# Patient Record
Sex: Male | Born: 1960 | Race: White | Hispanic: No | Smoking: Former smoker
Health system: Southern US, Community
[De-identification: ages and names within clinical notes are randomized; demographics above are authoritative.]

## PROBLEM LIST (undated history)

## (undated) DIAGNOSIS — I10 Essential (primary) hypertension: Secondary | ICD-10-CM

## (undated) DIAGNOSIS — E785 Hyperlipidemia, unspecified: Secondary | ICD-10-CM

## (undated) DIAGNOSIS — I2089 Other forms of angina pectoris: Secondary | ICD-10-CM

## (undated) DIAGNOSIS — B019 Varicella without complication: Secondary | ICD-10-CM

## (undated) DIAGNOSIS — N200 Calculus of kidney: Secondary | ICD-10-CM

## (undated) HISTORY — DX: Calculus of kidney: N20.0

## (undated) HISTORY — DX: Essential (primary) hypertension: I10

## (undated) HISTORY — PX: TONSILLECTOMY: SUR1361

## (undated) HISTORY — DX: Hyperlipidemia, unspecified: E78.5

## (undated) HISTORY — DX: Varicella without complication: B01.9

## (undated) HISTORY — DX: Other forms of angina pectoris: I20.89

---

## 2001-12-09 ENCOUNTER — Emergency Department (HOSPITAL_COMMUNITY): Admission: EM | Admit: 2001-12-09 | Discharge: 2001-12-09 | Payer: Self-pay | Admitting: Emergency Medicine

## 2005-08-19 ENCOUNTER — Ambulatory Visit: Payer: Self-pay | Admitting: Internal Medicine

## 2007-11-25 ENCOUNTER — Emergency Department (HOSPITAL_BASED_OUTPATIENT_CLINIC_OR_DEPARTMENT_OTHER): Admission: EM | Admit: 2007-11-25 | Discharge: 2007-11-25 | Payer: Self-pay | Admitting: Emergency Medicine

## 2008-08-12 ENCOUNTER — Ambulatory Visit: Payer: Self-pay | Admitting: Radiology

## 2008-08-12 ENCOUNTER — Emergency Department (HOSPITAL_BASED_OUTPATIENT_CLINIC_OR_DEPARTMENT_OTHER): Admission: EM | Admit: 2008-08-12 | Discharge: 2008-08-12 | Payer: Self-pay | Admitting: Emergency Medicine

## 2010-10-05 LAB — URINE MICROSCOPIC-ADD ON

## 2010-10-05 LAB — URINALYSIS, ROUTINE W REFLEX MICROSCOPIC
Bilirubin Urine: NEGATIVE
Glucose, UA: NEGATIVE mg/dL
Ketones, ur: NEGATIVE mg/dL
Leukocytes, UA: NEGATIVE
Nitrite: NEGATIVE
Protein, ur: NEGATIVE mg/dL
Specific Gravity, Urine: 1.029 (ref 1.005–1.030)
Urobilinogen, UA: 0.2 mg/dL (ref 0.0–1.0)
pH: 5.5 (ref 5.0–8.0)

## 2011-06-21 DIAGNOSIS — N200 Calculus of kidney: Secondary | ICD-10-CM

## 2011-06-21 HISTORY — DX: Calculus of kidney: N20.0

## 2013-09-24 ENCOUNTER — Telehealth: Payer: Self-pay

## 2013-09-24 NOTE — Telephone Encounter (Signed)
Home phone number invalid.  Unable to reach at work number.

## 2013-09-25 ENCOUNTER — Other Ambulatory Visit (INDEPENDENT_AMBULATORY_CARE_PROVIDER_SITE_OTHER): Payer: BC Managed Care – PPO

## 2013-09-25 ENCOUNTER — Ambulatory Visit: Payer: Self-pay | Admitting: Internal Medicine

## 2013-09-25 DIAGNOSIS — Z0000 Encounter for general adult medical examination without abnormal findings: Secondary | ICD-10-CM

## 2013-09-25 NOTE — Addendum Note (Signed)
Addended by: Silvio PateHOMPSON, Pablo Stauffer D on: 09/25/2013 02:52 PM   Modules accepted: Orders

## 2013-09-26 ENCOUNTER — Encounter: Payer: Self-pay | Admitting: Family Medicine

## 2013-09-26 ENCOUNTER — Ambulatory Visit (INDEPENDENT_AMBULATORY_CARE_PROVIDER_SITE_OTHER): Payer: BC Managed Care – PPO | Admitting: Family Medicine

## 2013-09-26 ENCOUNTER — Telehealth: Payer: Self-pay | Admitting: *Deleted

## 2013-09-26 VITALS — BP 120/76 | HR 82 | Temp 98.8°F | Ht 70.0 in | Wt 244.0 lb

## 2013-09-26 DIAGNOSIS — F17200 Nicotine dependence, unspecified, uncomplicated: Secondary | ICD-10-CM | POA: Insufficient documentation

## 2013-09-26 DIAGNOSIS — Z0000 Encounter for general adult medical examination without abnormal findings: Secondary | ICD-10-CM

## 2013-09-26 DIAGNOSIS — E669 Obesity, unspecified: Secondary | ICD-10-CM

## 2013-09-26 DIAGNOSIS — Z23 Encounter for immunization: Secondary | ICD-10-CM

## 2013-09-26 DIAGNOSIS — E785 Hyperlipidemia, unspecified: Secondary | ICD-10-CM

## 2013-09-26 HISTORY — DX: Obesity, unspecified: E66.9

## 2013-09-26 HISTORY — DX: Nicotine dependence, unspecified, uncomplicated: F17.200

## 2013-09-26 LAB — CBC WITH DIFFERENTIAL/PLATELET
Basophils Absolute: 0.1 10*3/uL (ref 0.0–0.1)
Basophils Relative: 0.5 % (ref 0.0–3.0)
Eosinophils Absolute: 0.4 10*3/uL (ref 0.0–0.7)
Eosinophils Relative: 4.4 % (ref 0.0–5.0)
HCT: 44.2 % (ref 39.0–52.0)
Hemoglobin: 14.5 g/dL (ref 13.0–17.0)
Lymphocytes Relative: 30.1 % (ref 12.0–46.0)
Lymphs Abs: 3 10*3/uL (ref 0.7–4.0)
MCHC: 32.9 g/dL (ref 30.0–36.0)
MCV: 87.7 fl (ref 78.0–100.0)
Monocytes Absolute: 0.7 10*3/uL (ref 0.1–1.0)
Monocytes Relative: 7.1 % (ref 3.0–12.0)
Neutro Abs: 5.7 10*3/uL (ref 1.4–7.7)
Neutrophils Relative %: 57.9 % (ref 43.0–77.0)
Platelets: 249 10*3/uL (ref 150.0–400.0)
RBC: 5.04 Mil/uL (ref 4.22–5.81)
RDW: 14.7 % — ABNORMAL HIGH (ref 11.5–14.6)
WBC: 9.8 10*3/uL (ref 4.5–10.5)

## 2013-09-26 LAB — BASIC METABOLIC PANEL
BUN: 20 mg/dL (ref 6–23)
CO2: 26 mEq/L (ref 19–32)
Calcium: 9.6 mg/dL (ref 8.4–10.5)
Chloride: 103 mEq/L (ref 96–112)
Creatinine, Ser: 0.9 mg/dL (ref 0.4–1.5)
GFR: 97.74 mL/min (ref >60.00)
Glucose, Bld: 77 mg/dL (ref 70–99)
Potassium: 3.8 mEq/L (ref 3.5–5.1)
Sodium: 139 mEq/L (ref 135–145)

## 2013-09-26 LAB — HEPATIC FUNCTION PANEL
ALT: 30 U/L (ref 0–53)
AST: 22 U/L (ref 0–37)
Albumin: 4.5 g/dL (ref 3.5–5.2)
Alkaline Phosphatase: 54 U/L (ref 39–117)
Bilirubin, Direct: 0 mg/dL (ref 0.0–0.3)
Total Bilirubin: 0.8 mg/dL (ref 0.3–1.2)
Total Protein: 8.1 g/dL (ref 6.0–8.3)

## 2013-09-26 LAB — LIPID PANEL
Cholesterol: 253 mg/dL — ABNORMAL HIGH (ref 0–200)
HDL: 40.1 mg/dL (ref >39.00)
LDL Cholesterol: 186 mg/dL — ABNORMAL HIGH (ref 0–99)
Total CHOL/HDL Ratio: 6
Triglycerides: 136 mg/dL (ref 0.0–149.0)
VLDL: 27.2 mg/dL (ref 0.0–40.0)

## 2013-09-26 NOTE — Progress Notes (Signed)
Subjective:    Patient ID: Dennis Hopkins, male    DOB: 09-10-1960, 53 y.o.   MRN: [id number]  HPI Pt here to establish and have cpe.  Pt had labs drawn yesterday.    Past Medical History  Diagnosis Date  . Hyperlipemia   . Hypertension   . Kidney stones 2013  . Chickenpox    History   Social History  . Marital Status: Married    Spouse Name: N/A    Number of Children: N/A  . Years of Education: N/A   Occupational History  . Not on file.   Social History Main Topics  . Smoking status: Current Every Day Smoker -- 2.00 packs/day for 35 years    Types: Cigarettes  . Smokeless tobacco: Never Used  . Alcohol Use: Yes     Comment: Occasionall  . Drug Use: No  . Sexual Activity: Yes    Partners: Female   Other Topics Concern  . Not on file   Social History Narrative  . No narrative on file   Family History  Problem Relation Age of Onset  . Arthritis Mother   . Arthritis Father   . Hyperlipidemia Father    No current outpatient prescriptions on file.   No current facility-administered medications for this visit.   Allergies  Allergen Reactions  . Asa [Aspirin] Anaphylaxis and Swelling    Lips swelling      Review of Systems  Constitutional: Negative.   HENT: Negative for congestion, ear pain, hearing loss, nosebleeds, postnasal drip, rhinorrhea, sinus pressure, sneezing and tinnitus.   Eyes: Negative for photophobia, discharge, itching and visual disturbance.  Respiratory: Negative.   Cardiovascular: Negative.   Gastrointestinal: Negative for abdominal pain, constipation, blood in stool, abdominal distention and anal bleeding.  Endocrine: Negative.   Genitourinary: Negative.   Musculoskeletal: Negative.   Skin: Negative.   Allergic/Immunologic: Negative.   Neurological: Negative for dizziness, weakness, light-headedness, numbness and headaches.  Psychiatric/Behavioral: Negative for suicidal ideas, confusion, sleep disturbance, dysphoric mood,  decreased concentration and agitation. The patient is not nervous/anxious.        Objective:   Physical Exam  Constitutional: He is oriented to person, place, and time. He appears well-developed and well-nourished. No distress.  HENT:  Head: Normocephalic and atraumatic.  Right Ear: External ear normal.  Left Ear: External ear normal.  Nose: Nose normal.  Mouth/Throat: Oropharynx is clear and moist. No oropharyngeal exudate.  Eyes: Conjunctivae and EOM are normal. Pupils are equal, round, and reactive to light. Right eye exhibits no discharge. Left eye exhibits no discharge.  Neck: Normal range of motion. Neck supple. No JVD present. No thyromegaly present.  Cardiovascular: Normal rate, regular rhythm and intact distal pulses.  Exam reveals no gallop and no friction rub.   No murmur heard. Pulmonary/Chest: Effort normal and breath sounds normal. No respiratory distress. He has no wheezes. He has no rales. He exhibits no tenderness.  Abdominal: Soft. Bowel sounds are normal. He exhibits no distension and no mass. There is no tenderness. There is no rebound and no guarding.  Genitourinary: Rectum normal, prostate normal and penis normal. Guaiac negative stool.  Musculoskeletal: Normal range of motion. He exhibits no edema and no tenderness.  Lymphadenopathy:    He has no cervical adenopathy.  Neurological: He is alert and oriented to person, place, and time. He displays normal reflexes. He exhibits normal muscle tone.  Skin: Skin is warm and dry. No rash noted. He is not diaphoretic. No erythema.  No pallor.  Psychiatric: He has a normal mood and affect. His behavior is normal. Judgment and thought content normal.          Assessment & Plan:  1. Tobacco use disorder Pt given HO and we discussed options-- he wants to try e cig  2. Other and unspecified hyperlipidemia Labs reviewed and repeat in 3 months - Hepatic function panel; Future - Lipid panel; Future  3. Preventative  health care Check labs ghm--- not utd Colon ordered tdap given today - Ambulatory referral to Gastroenterology

## 2013-09-26 NOTE — Progress Notes (Signed)
Pre visit review using our clinic review tool, if applicable. No additional management support is needed unless otherwise documented below in the visit note. 

## 2013-09-26 NOTE — Patient Instructions (Addendum)
Preventive Care for Adults, Male A healthy lifestyle and preventive care can promote health and wellness. Preventive health guidelines for men include the following key practices:  A routine yearly physical is a good way to check with your health care provider about your health and preventative screening. It is a chance to share any concerns and updates on your health and to receive a thorough exam.  Visit your dentist for a routine exam and preventative care every 6 months. Brush your teeth twice a day and floss once a day. Good oral hygiene prevents tooth decay and gum disease.  The frequency of eye exams is based on your age, health, family medical history, use of contact lenses, and other factors. Follow your health care provider's recommendations for frequency of eye exams.  Eat a healthy diet. Foods such as vegetables, fruits, whole grains, low-fat dairy products, and lean protein foods contain the nutrients you need without too many calories. Decrease your intake of foods high in solid fats, added sugars, and salt. Eat the right amount of calories for you.Get information about a proper diet from your health care provider, if necessary.  Regular physical exercise is one of the most important things you can do for your health. Most adults should get at least 150 minutes of moderate-intensity exercise (any activity that increases your heart rate and causes you to sweat) each week. In addition, most adults need muscle-strengthening exercises on 2 or more days a week.  Maintain a healthy weight. The body mass index (BMI) is a screening tool to identify possible weight problems. It provides an estimate of body fat based on height and weight. Your health care provider can find your BMI and can help you achieve or maintain a healthy weight.For adults 20 years and older:  A BMI below 18.5 is considered underweight.  A BMI of 18.5 to 24.9 is normal.  A BMI of 25 to 29.9 is considered  overweight.  A BMI of 30 and above is considered obese.  Maintain normal blood lipids and cholesterol levels by exercising and minimizing your intake of saturated fat. Eat a balanced diet with plenty of fruit and vegetables. Blood tests for lipids and cholesterol should begin at age 42 and be repeated every 5 years. If your lipid or cholesterol levels are high, you are over 50, or you are at high risk for heart disease, you may need your cholesterol levels checked more frequently.Ongoing high lipid and cholesterol levels should be treated with medicines if diet and exercise are not working.  If you smoke, find out from your health care provider how to quit. If you do not use tobacco, do not start.  Lung cancer screening is recommended for adults aged 24 80 years who are at high risk for developing lung cancer because of a history of smoking. A yearly low-dose CT scan of the lungs is recommended for people who have at least a 30-pack-year history of smoking and are a current smoker or have quit within the past 15 years. A pack year of smoking is smoking an average of 1 pack of cigarettes a day for 1 year (for example: 1 pack a day for 30 years or 2 packs a day for 15 years). Yearly screening should continue until the smoker has stopped smoking for at least 15 years. Yearly screening should be stopped for people who develop a health problem that would prevent them from having lung cancer treatment.  If you choose to drink alcohol, do not have  more than 2 drinks per day. One drink is considered to be 12 ounces (355 mL) of beer, 5 ounces (148 mL) of wine, or 1.5 ounces (44 mL) of liquor.  Avoid use of street drugs. Do not share needles with anyone. Ask for help if you need support or instructions about stopping the use of drugs.  High blood pressure causes heart disease and increases the risk of stroke. Your blood pressure should be checked at least every 1 2 years. Ongoing high blood pressure should be  treated with medicines, if weight loss and exercise are not effective.  If you are 75 53 years old, ask your health care provider if you should take aspirin to prevent heart disease.  Diabetes screening involves taking a blood sample to check your fasting blood sugar level. This should be done once every 3 years, after age 19, if you are within normal weight and without risk factors for diabetes. Testing should be considered at a younger age or be carried out more frequently if you are overweight and have at least 1 risk factor for diabetes.  Colorectal cancer can be detected and often prevented. Most routine colorectal cancer screening begins at the age of 47 and continues through age 80. However, your health care provider may recommend screening at an earlier age if you have risk factors for colon cancer. On a yearly basis, your health care provider may provide home test kits to check for hidden blood in the stool. Use of a small camera at the end of a tube to directly examine the colon (sigmoidoscopy or colonoscopy) can detect the earliest forms of colorectal cancer. Talk to your health care provider about this at age 66, when routine screening begins. Direct exam of the colon should be repeated every 5 10 years through age 19, unless early forms of precancerous polyps or small growths are found.  People who are at an increased risk for hepatitis B should be screened for this virus. You are considered at high risk for hepatitis B if:  You were born in a country where hepatitis B occurs often. Talk with your health care provider about which countries are considered high-risk.  Your parents were born in a high-risk country and you have not received a shot to protect against hepatitis B (hepatitis B vaccine).  You have HIV or AIDS.  You use needles to inject street drugs.  You live with, or have sex with, someone who has hepatitis B.  You are a man who has sex with other men (MSM).  You get  hemodialysis treatment.  You take certain medicines for conditions such as cancer, organ transplantation, and autoimmune conditions.  Hepatitis C blood testing is recommended for all people born from [birth year] through 1965 and any individual with known risks for hepatitis C.  Practice safe sex. Use condoms and avoid high-risk sexual practices to reduce the spread of sexually transmitted infections (STIs). STIs include gonorrhea, chlamydia, syphilis, trichomonas, herpes, HPV, and human immunodeficiency virus (HIV). Herpes, HIV, and HPV are viral illnesses that have no cure. They can result in disability, cancer, and death.  A one-time screening for abdominal aortic aneurysm (AAA) and surgical repair of large AAAs by ultrasound are recommended for men ages 94 to 74 years who are current or former smokers.  Healthy men should no longer receive prostate-specific antigen (PSA) blood tests as part of routine cancer screening. Talk with your health care provider about prostate cancer screening.  Testicular cancer screening is not recommended  for adult males who have no symptoms. Screening includes self-exam, a health care provider exam, and other screening tests. Consult with your health care provider about any symptoms you have or any concerns you have about testicular cancer.  Use sunscreen. Apply sunscreen liberally and repeatedly throughout the day. You should seek shade when your shadow is shorter than you. Protect yourself by wearing long sleeves, pants, a wide-brimmed hat, and sunglasses year round, whenever you are outdoors.  Once a month, do a whole-body skin exam, using a mirror to look at the skin on your back. Tell your health care provider about new moles, moles that have irregular borders, moles that are larger than a pencil eraser, or moles that have changed in shape or color.  Stay current with required vaccines (immunizations).  Influenza vaccine. All adults should be immunized every  year.  Tetanus, diphtheria, and acellular pertussis (Td, Tdap) vaccine. An adult who has not previously received Tdap or who does not know his vaccine status should receive 1 dose of Tdap. This initial dose should be followed by tetanus and diphtheria toxoids (Td) booster doses every 10 years. Adults with an unknown or incomplete history of completing a 3-dose immunization series with Td-containing vaccines should begin or complete a primary immunization series including a Tdap dose. Adults should receive a Td booster every 10 years.  Varicella vaccine. An adult without evidence of immunity to varicella should receive 2 doses or a second dose if he has previously received 1 dose.  Human papillomavirus (HPV) vaccine. Males aged 44 21 years who have not received the vaccine previously should receive the 3-dose series. Males aged 43 26 years may be immunized. Immunization is recommended through the age of 50 years for any male who has sex with males and did not get any or all doses earlier. Immunization is recommended for any person with an immunocompromised condition through the age of 23 years if he did not get any or all doses earlier. During the 3-dose series, the second dose should be obtained 4 8 weeks after the first dose. The third dose should be obtained 24 weeks after the first dose and 16 weeks after the second dose.  Zoster vaccine. One dose is recommended for adults aged 96 years or older unless certain conditions are present.  Measles, mumps, and rubella (MMR) vaccine. Adults born before 55 generally are considered immune to measles and mumps. Adults born in [birth year] or later should have 1 or more doses of MMR vaccine unless there is a contraindication to the vaccine or there is laboratory evidence of immunity to each of the three diseases. A routine second dose of MMR vaccine should be obtained at least 28 days after the first dose for students attending postsecondary schools, health care  workers, or international travelers. People who received inactivated measles vaccine or an unknown type of measles vaccine during 1963 1967 should receive 2 doses of MMR vaccine. People who received inactivated mumps vaccine or an unknown type of mumps vaccine before 1979 and are at high risk for mumps infection should consider immunization with 2 doses of MMR vaccine. Unvaccinated health care workers born before [birth year] who lack laboratory evidence of measles, mumps, or rubella immunity or laboratory confirmation of disease should consider measles and mumps immunization with 2 doses of MMR vaccine or rubella immunization with 1 dose of MMR vaccine.  Pneumococcal 13-valent conjugate (PCV13) vaccine. When indicated, a person who is uncertain of his immunization history and has no record of immunization  should receive the PCV13 vaccine. An adult aged 67 years or older who has certain medical conditions and has not been previously immunized should receive 1 dose of PCV13 vaccine. This PCV13 should be followed with a dose of pneumococcal polysaccharide (PPSV23) vaccine. The PPSV23 vaccine dose should be obtained at least 8 weeks after the dose of PCV13 vaccine. An adult aged 79 years or older who has certain medical conditions and previously received 1 or more doses of PPSV23 vaccine should receive 1 dose of PCV13. The PCV13 vaccine dose should be obtained 1 or more years after the last PPSV23 vaccine dose.  Pneumococcal polysaccharide (PPSV23) vaccine. When PCV13 is also indicated, PCV13 should be obtained first. All adults aged 74 years and older should be immunized. An adult younger than age 50 years who has certain medical conditions should be immunized. Any person who resides in a nursing home or long-term care facility should be immunized. An adult smoker should be immunized. People with an immunocompromised condition and certain other conditions should receive both PCV13 and PPSV23 vaccines. People with human  immunodeficiency virus (HIV) infection should be immunized as soon as possible after diagnosis. Immunization during chemotherapy or radiation therapy should be avoided. Routine use of PPSV23 vaccine is not recommended for American Indians, [place] Natives, or people younger than 65 years unless there are medical conditions that require PPSV23 vaccine. When indicated, people who have unknown immunization and have no record of immunization should receive PPSV23 vaccine. One-time revaccination 5 years after the first dose of PPSV23 is recommended for people aged 41 64 years who have chronic kidney failure, nephrotic syndrome, asplenia, or immunocompromised conditions. People who received 1 2 doses of PPSV23 before age 15 years should receive another dose of PPSV23 vaccine at age 48 years or later if at least 5 years have passed since the previous dose. Doses of PPSV23 are not needed for people immunized with PPSV23 at or after age 69 years.  Meningococcal vaccine. Adults with asplenia or persistent complement component deficiencies should receive 2 doses of quadrivalent meningococcal conjugate (MenACWY-D) vaccine. The doses should be obtained at least 2 months apart. Microbiologists working with certain meningococcal bacteria, [place] recruits, people at risk during an outbreak, and people who travel to or live in countries with a high rate of meningitis should be immunized. A first-year college student up through age 7 years who is living in a residence hall should receive a dose if he did not receive a dose on or after his 16th birthday. Adults who have certain high-risk conditions should receive one or more doses of vaccine.  Hepatitis A vaccine. Adults who wish to be protected from this disease, have certain high-risk conditions, work with hepatitis A-infected animals, work in hepatitis A research labs, or travel to or work in countries with a high rate of hepatitis A should be immunized. Adults who were  previously unvaccinated and who anticipate close contact with an international adoptee during the first 60 days after arrival in the [place] from a country with a high rate of hepatitis A should be immunized.  Hepatitis B vaccine. Adults who wish to be protected from this disease, have certain high-risk conditions, may be exposed to blood or other infectious body fluids, are household contacts or sex partners of hepatitis B positive people, are clients or workers in certain care facilities, or travel to or work in countries with a high rate of hepatitis B should be immunized.  Haemophilus influenzae type b (Hib) vaccine. A  previously unvaccinated person with asplenia or sickle cell disease or having a scheduled splenectomy should receive 1 dose of Hib vaccine. Regardless of previous immunization, a recipient of a hematopoietic stem cell transplant should receive a 3-dose series 6 12 months after his successful transplant. Hib vaccine is not recommended for adults with HIV infection. Preventive Service / Frequency Ages 62 to 3  Blood pressure check.** / Every 1 to 2 years.  Lipid and cholesterol check.** / Every 5 years beginning at age 43.  Hepatitis C blood test.** / For any individual with known risks for hepatitis C.  Skin self-exam. / Monthly.  Influenza vaccine. / Every year.  Tetanus, diphtheria, and acellular pertussis (Tdap, Td) vaccine.** / Consult your health care provider. 1 dose of Td every 10 years.  Varicella vaccine.** / Consult your health care provider.  HPV vaccine. / 3 doses over 6 months, if 48 or younger.  Measles, mumps, rubella (MMR) vaccine.** / You need at least 1 dose of MMR if you were born in 1957 or later. You may also need a second dose.  Pneumococcal 13-valent conjugate (PCV13) vaccine.** / Consult your health care provider.  Pneumococcal polysaccharide (PPSV23) vaccine.** / 1 to 2 doses if you smoke cigarettes or if you have certain  conditions.  Meningococcal vaccine.** / 1 dose if you are age 8 to 70 years and a Market researcher living in a residence hall, or have one of several medical conditions. You may also need additional booster doses.  Hepatitis A vaccine.** / Consult your health care provider.  Hepatitis B vaccine.** / Consult your health care provider.  Haemophilus influenzae type b (Hib) vaccine.** / Consult your health care provider. Ages 48 to 32  Blood pressure check.** / Every 1 to 2 years.  Lipid and cholesterol check.** / Every 5 years beginning at age 38.  Lung cancer screening. / Every year if you are aged 40 80 years and have a 30-pack-year history of smoking and currently smoke or have quit within the past 15 years. Yearly screening is stopped once you have quit smoking for at least 15 years or develop a health problem that would prevent you from having lung cancer treatment.  Fecal occult blood test (FOBT) of stool. / Every year beginning at age 4 and continuing until age 70. You may not have to do this test if you get a colonoscopy every 10 years.  Flexible sigmoidoscopy** or colonoscopy.** / Every 5 years for a flexible sigmoidoscopy or every 10 years for a colonoscopy beginning at age 76 and continuing until age 62.  Hepatitis C blood test.** / For all people born from [birth year] through 1965 and any individual with known risks for hepatitis C.  Skin self-exam. / Monthly.  Influenza vaccine. / Every year.  Tetanus, diphtheria, and acellular pertussis (Tdap/Td) vaccine.** / Consult your health care provider. 1 dose of Td every 10 years.  Varicella vaccine.** / Consult your health care provider.  Zoster vaccine.** / 1 dose for adults aged 60 years or older.  Measles, mumps, rubella (MMR) vaccine.** / You need at least 1 dose of MMR if you were born in 1957 or later. You may also need a second dose.  Pneumococcal 13-valent conjugate (PCV13) vaccine.** / Consult your health care  provider.  Pneumococcal polysaccharide (PPSV23) vaccine.** / 1 to 2 doses if you smoke cigarettes or if you have certain conditions.  Meningococcal vaccine.** / Consult your health care provider.  Hepatitis A vaccine.** / Consult your health care  provider.  Hepatitis B vaccine.** / Consult your health care provider.  Haemophilus influenzae type b (Hib) vaccine.** / Consult your health care provider. Ages 81 and over  Blood pressure check.** / Every 1 to 2 years.  Lipid and cholesterol check.**/ Every 5 years beginning at age 78.  Lung cancer screening. / Every year if you are aged 60 80 years and have a 30-pack-year history of smoking and currently smoke or have quit within the past 15 years. Yearly screening is stopped once you have quit smoking for at least 15 years or develop a health problem that would prevent you from having lung cancer treatment.  Fecal occult blood test (FOBT) of stool. / Every year beginning at age 16 and continuing until age 5. You may not have to do this test if you get a colonoscopy every 10 years.  Flexible sigmoidoscopy** or colonoscopy.** / Every 5 years for a flexible sigmoidoscopy or every 10 years for a colonoscopy beginning at age 68 and continuing until age 58.  Hepatitis C blood test.** / For all people born from [birth year] through 1965 and any individual with known risks for hepatitis C.  Abdominal aortic aneurysm (AAA) screening.** / A one-time screening for ages 58 to 36 years who are current or former smokers.  Skin self-exam. / Monthly.  Influenza vaccine. / Every year.  Tetanus, diphtheria, and acellular pertussis (Tdap/Td) vaccine.** / 1 dose of Td every 10 years.  Varicella vaccine.** / Consult your health care provider.  Zoster vaccine.** / 1 dose for adults aged 26 years or older.  Pneumococcal 13-valent conjugate (PCV13) vaccine.** / Consult your health care provider.  Pneumococcal polysaccharide (PPSV23) vaccine.** / 1 dose for all  adults aged 64 years and older.  Meningococcal vaccine.** / Consult your health care provider.  Hepatitis A vaccine.** / Consult your health care provider.  Hepatitis B vaccine.** / Consult your health care provider.  Haemophilus influenzae type b (Hib) vaccine.** / Consult your health care provider. **Family history and personal history of risk and conditions may change your health care provider's recommendations. Document Released: 08/02/2001 Document Revised: 03/27/2013 Document Reviewed: 11/01/2010 [facility] Patient Information 2014 [place], [place]. Smoking Cessation, Tips for Success If you are ready to quit smoking, congratulations! You have chosen to help yourself be healthier. Cigarettes bring nicotine, tar, carbon monoxide, and other irritants into your body. Your lungs, heart, and blood vessels will be able to work better without these poisons. There are many different ways to quit smoking. Nicotine gum, nicotine patches, a nicotine inhaler, or nicotine nasal spray can help with physical craving. Hypnosis, support groups, and medicines help break the habit of smoking. WHAT THINGS CAN I DO TO MAKE QUITTING EASIER?  Here are some tips to help you quit for good:  Pick a date when you will quit smoking completely. Tell all of your friends and family about your plan to quit on that date.  Do not try to slowly cut down on the number of cigarettes you are smoking. Pick a quit date and quit smoking completely starting on that day.  Throw away all cigarettes.   Clean and remove all ashtrays from your home, work, and car.   On a card, write down your reasons for quitting. Carry the card with you and read it when you get the urge to smoke.   Cleanse your body of nicotine. Drink enough water and fluids to keep your urine clear or pale yellow. Do this after quitting to flush the nicotine from  your body.   Learn to predict your moods. Do not let a bad situation be your excuse to have a  cigarette. Some situations in your life might tempt you into wanting a cigarette.   Never have "just one" cigarette. It leads to wanting another and another. Remind yourself of your decision to quit.   Change habits associated with smoking. If you smoked while driving or when feeling stressed, try other activities to replace smoking. Stand up when drinking your coffee. Brush your teeth after eating. Sit in a different chair when you read the paper. Avoid alcohol while trying to quit, and try to drink fewer caffeinated beverages. Alcohol and caffeine may urge you to smoke.   Avoid foods and drinks that can trigger a desire to smoke, such as sugary or spicy foods and alcohol.   Ask people who smoke not to smoke around you.   Have something planned to do right after eating or having a cup of coffee. For example, plan to take a walk or exercise.   Try a relaxation exercise to calm you down and decrease your stress. Remember, you may be tense and nervous for the first 2 weeks after you quit, but this will pass.   Find new activities to keep your hands busy. Play with a pen, coin, or rubber band. Doodle or draw things on paper.   Brush your teeth right after eating. This will help cut down on the craving for the taste of tobacco after meals. You can also try mouthwash.   Use oral substitutes in place of cigarettes. Try using lemon drops, carrots, cinnamon sticks, or chewing gum. Keep them handy so they are available when you have the urge to smoke.   When you have the urge to smoke, try deep breathing.   Designate your home as a nonsmoking area.   If you are a heavy smoker, ask your health care provider about a prescription for nicotine chewing gum. It can ease your withdrawal from nicotine.   Reward yourself. Set aside the cigarette money you save and buy yourself something nice.   Look for support from others. Join a support group or smoking cessation program. Ask someone at  home or at work to help you with your plan to quit smoking.   Always ask yourself, "Do I need this cigarette or is this just a reflex?" Tell yourself, "Today, I choose not to smoke," or "I do not want to smoke." You are reminding yourself of your decision to quit.  Do not replace cigarette smoking with electronic cigarettes (commonly called e-cigarettes). The safety of e-cigarettes is unknown, and some may contain harmful chemicals.  If you relapse, do not give up! Plan ahead and think about what you will do the next time you get the urge to smoke.  HOW WILL I FEEL WHEN I QUIT SMOKING? You may have symptoms of withdrawal because your body is used to nicotine (the addictive substance in cigarettes). You may crave cigarettes, be irritable, feel very hungry, cough often, get headaches, or have difficulty concentrating. The withdrawal symptoms are only temporary. They are strongest when you first quit but will go away within 10 14 days. When withdrawal symptoms occur, stay in control. Think about your reasons for quitting. Remind yourself that these are signs that your body is healing and getting used to being without cigarettes. Remember that withdrawal symptoms are easier to treat than the major diseases that smoking can cause.  Even after the withdrawal is over,  expect periodic urges to smoke. However, these cravings are generally short lived and will go away whether you smoke or not. Do not smoke!  WHAT RESOURCES ARE AVAILABLE TO HELP ME QUIT SMOKING? Your health care provider can direct you to community resources or hospitals for support, which may include:  Group support.  Education.  Hypnosis.  Therapy. Document Released: 03/04/2004 Document Revised: 03/27/2013 Document Reviewed: 11/22/2012 [facility] Patient Information 2014 [place], [place].

## 2013-09-26 NOTE — Telephone Encounter (Signed)
Unable to reach prior to visit  

## 2013-09-26 NOTE — Addendum Note (Signed)
Addended by: Arnette NorrisPAYNE, Vasti Yagi P on: 09/26/2013 05:01 PM   Modules accepted: Orders

## 2013-09-26 NOTE — Telephone Encounter (Signed)
Patient presented to the office yesterday for a new to establish appt with Dr. Drue NovelPaz (Pt thought he was seeing Dr. Beverely Lowabori, refused to see Dr. Drue NovelPaz.) Patient was made aware that Dr.Tabori did not have anything available until the end of May, pt stated he could not wait that long. Appt scheduled with Dr.Lowne for 09/26/13 at 3:45, pt requested labs be drawn since he was already fasting.Orders placed and drawn  Lipid, CBC-D, BMP and Hepatic panel for upcoming appt.

## 2013-10-28 ENCOUNTER — Encounter: Payer: Self-pay | Admitting: Family Medicine

## 2013-12-26 ENCOUNTER — Other Ambulatory Visit (INDEPENDENT_AMBULATORY_CARE_PROVIDER_SITE_OTHER): Payer: BC Managed Care – PPO

## 2013-12-26 DIAGNOSIS — E785 Hyperlipidemia, unspecified: Secondary | ICD-10-CM

## 2013-12-26 LAB — HEPATIC FUNCTION PANEL
ALT: 21 U/L (ref 0–53)
AST: 19 U/L (ref 0–37)
Albumin: 4.1 g/dL (ref 3.5–5.2)
Alkaline Phosphatase: 52 U/L (ref 39–117)
Bilirubin, Direct: 0 mg/dL (ref 0.0–0.3)
Total Bilirubin: 0.4 mg/dL (ref 0.2–1.2)
Total Protein: 7.3 g/dL (ref 6.0–8.3)

## 2013-12-26 LAB — LIPID PANEL
Cholesterol: 211 mg/dL — ABNORMAL HIGH (ref 0–200)
HDL: 44.2 mg/dL (ref >39.00)
LDL Cholesterol: 152 mg/dL — ABNORMAL HIGH (ref 0–99)
NonHDL: 166.8
Total CHOL/HDL Ratio: 5
Triglycerides: 75 mg/dL (ref 0.0–149.0)
VLDL: 15 mg/dL (ref 0.0–40.0)

## 2014-09-10 ENCOUNTER — Telehealth: Payer: Self-pay | Admitting: Family Medicine

## 2014-09-10 NOTE — Telephone Encounter (Signed)
pre visit letter sent °

## 2014-09-29 ENCOUNTER — Encounter: Payer: Self-pay | Admitting: Family Medicine

## 2014-09-29 ENCOUNTER — Ambulatory Visit (INDEPENDENT_AMBULATORY_CARE_PROVIDER_SITE_OTHER): Payer: BLUE CROSS/BLUE SHIELD | Admitting: Family Medicine

## 2014-09-29 VITALS — BP 120/80 | HR 81 | Temp 98.3°F | Ht 71.0 in | Wt 258.0 lb

## 2014-09-29 DIAGNOSIS — Z0000 Encounter for general adult medical examination without abnormal findings: Secondary | ICD-10-CM | POA: Insufficient documentation

## 2014-09-29 HISTORY — DX: Encounter for general adult medical examination without abnormal findings: Z00.00

## 2014-09-29 NOTE — Progress Notes (Signed)
Patient ID: Dennis Hopkins, male    DOB: 1961/05/14  Age: 54 y.o. MRN: [id number]    Subjective:  Subjective HPI SUREN PAYNE presents for cpe---  No problems Review of Systems  Constitutional: Negative.   HENT: Negative for congestion, ear pain, hearing loss, nosebleeds, postnasal drip, rhinorrhea, sinus pressure, sneezing and tinnitus.   Eyes: Negative for photophobia, discharge, itching and visual disturbance.  Respiratory: Negative.   Cardiovascular: Negative.   Gastrointestinal: Negative for abdominal pain, constipation, blood in stool, abdominal distention and anal bleeding.  Endocrine: Negative.   Genitourinary: Negative.   Musculoskeletal: Negative.   Skin: Negative.   Allergic/Immunologic: Negative.   Neurological: Negative for dizziness, weakness, light-headedness, numbness and headaches.  Psychiatric/Behavioral: Negative for suicidal ideas, confusion, sleep disturbance, dysphoric mood, decreased concentration and agitation. The patient is not nervous/anxious.     History Past Medical History  Diagnosis Date  . Hyperlipemia   . Hypertension   . Kidney stones 2013  . Chickenpox     He has past surgical history that includes No past surgeries.   His family history includes Arthritis in his father and mother; Diabetes in his brother; Hyperlipidemia in his father.He reports that he quit smoking about a year ago. His smoking use included Cigarettes. He has a 70 pack-year smoking history. He has never used smokeless tobacco. He reports that he drinks alcohol. He reports that he does not use illicit drugs.  No current outpatient prescriptions on file prior to visit.   No current facility-administered medications on file prior to visit.     Objective:  Objective Physical Exam  Constitutional: He is oriented to person, place, and time. He appears well-developed and well-nourished. No distress.  HENT:  Head: Normocephalic and atraumatic.  Right Ear: External ear  normal.  Left Ear: External ear normal.  Nose: Nose normal.  Mouth/Throat: Oropharynx is clear and moist. No oropharyngeal exudate.  Eyes: Conjunctivae and EOM are normal. Pupils are equal, round, and reactive to light. Right eye exhibits no discharge. Left eye exhibits no discharge.  Neck: Normal range of motion. Neck supple. No JVD present. No thyromegaly present.  Cardiovascular: Normal rate, regular rhythm, normal heart sounds and intact distal pulses.  Exam reveals no gallop and no friction rub.   No murmur heard. Pulmonary/Chest: Effort normal and breath sounds normal. No respiratory distress. He has no wheezes. He has no rales. He exhibits no tenderness.  Abdominal: Soft. Bowel sounds are normal. He exhibits no distension and no mass. There is no tenderness. There is no rebound and no guarding.  Genitourinary: Rectum normal, prostate normal and penis normal. Guaiac negative stool.  Musculoskeletal: Normal range of motion. He exhibits no edema or tenderness.  Lymphadenopathy:    He has no cervical adenopathy.  Neurological: He is alert and oriented to person, place, and time. He displays normal reflexes. He exhibits normal muscle tone.  Skin: Skin is warm and dry. No rash noted. He is not diaphoretic. No erythema. No pallor.  Psychiatric: He has a normal mood and affect. His behavior is normal. Judgment and thought content normal.   BP 120/80 mmHg  Pulse 81  Temp(Src) 98.3 F (36.8 C) (Oral)  Ht  (1.803 m)  Wt 258 lb (117.028 kg)  BMI 36.00 kg/m2  SpO2 98% Wt Readings from Last 3 Encounters:  09/29/14 258 lb (117.028 kg)  09/26/13 244 lb (110.678 kg)     Lab Results  Component Value Date   WBC 9.8 09/25/2013   HGB  14.5 09/25/2013   HCT 44.2 09/25/2013   PLT 249.0 09/25/2013   GLUCOSE 77 09/25/2013   CHOL 211* 12/26/2013   TRIG 75.0 12/26/2013   HDL 44.20 12/26/2013   LDLCALC 152* 12/26/2013   ALT 21 12/26/2013   AST 19 12/26/2013   NA 139 09/25/2013   K 3.8  09/25/2013   CL 103 09/25/2013   CREATININE 0.9 09/25/2013   BUN 20 09/25/2013   CO2 26 09/25/2013    Ct Abdomen Wo Contrast  08/12/2008   Clinical Data:  Right flank pain.  Hematuria.   CT ABDOMEN AND PELVIS WITHOUT CONTRAST   Technique:  Multidetector CT imaging of the abdomen and pelvis was performed following the standard protocol without intravenous contrast.   Comparison:  None available.   CT ABDOMEN   Findings:  Mild dependent atelectasis is present at the lung bases bilaterally.  The heart size is normal.  There is no significant pleural or pericardial effusion.   The liver and spleen are within normal limits.  The stomach is unremarkable.  The pancreas, common bile duct, and gallbladder are normal.  The adrenal glands and left kidney are within normal limits.  There is mild to moderate right-sided hydronephrosis.  The right ureter is dilated throughout its course into the pelvis where a 4 mm stone is obstructing ureter just above ureteral vesicle junction.  No additional stones are identified within the right kidney.  Minimal atherosclerotic calcifications are noted in the aorta without aneurysm.  Bone windows demonstrate a vacuum phenomenon at L4-5 with a large Schmorl's node invaginating into the superior endplate of L5.  Multilevel facet degenerative changes evident.   IMPRESSION: 1.  Obstructing 4 mm distal right ureteral stone, just above the right UVJ with mild to moderate hydroureter nephrosis. 2.  No additional nephrolithiasis evident. 3.  Degenerative changes of the lumbar spine including a large superior endplate Schmorl's node at L5.   CT PELVIS   Findings:  The sigmoid colon is mostly collapsed.  There is some air and stool within the ascending and transverse colon.  The appendix is visualized and within normal limits.  The urinary bladder is mostly collapsed.  There is no significant pelvic lymphadenopathy or free fluid.  Bone windows demonstrate mild degenerative change of the SI  joints bilaterally.   IMPRESSION: 1.  Distal right ureteral stone. 2.  No other significant pathology of the pelvis. 3.  Degenerative changes of the SI joints.  Provider: Imelda Pillow, Jenna McLawhon  Ct Pelvis Wo Contrast  08/12/2008   Clinical Data:  Right flank pain.  Hematuria.   CT ABDOMEN AND PELVIS WITHOUT CONTRAST   Technique:  Multidetector CT imaging of the abdomen and pelvis was performed following the standard protocol without intravenous contrast.   Comparison:  None available.   CT ABDOMEN   Findings:  Mild dependent atelectasis is present at the lung bases bilaterally.  The heart size is normal.  There is no significant pleural or pericardial effusion.   The liver and spleen are within normal limits.  The stomach is unremarkable.  The pancreas, common bile duct, and gallbladder are normal.  The adrenal glands and left kidney are within normal limits.  There is mild to moderate right-sided hydronephrosis.  The right ureter is dilated throughout its course into the pelvis where a 4 mm stone is obstructing ureter just above ureteral vesicle junction.  No additional stones are identified within the right kidney.  Minimal atherosclerotic calcifications are noted in the aorta without  aneurysm.  Bone windows demonstrate a vacuum phenomenon at L4-5 with a large Schmorl's node invaginating into the superior endplate of L5.  Multilevel facet degenerative changes evident.   IMPRESSION: 1.  Obstructing 4 mm distal right ureteral stone, just above the right UVJ with mild to moderate hydroureter nephrosis. 2.  No additional nephrolithiasis evident. 3.  Degenerative changes of the lumbar spine including a large superior endplate Schmorl's node at L5.   CT PELVIS   Findings:  The sigmoid colon is mostly collapsed.  There is some air and stool within the ascending and transverse colon.  The appendix is visualized and within normal limits.  The urinary bladder is mostly collapsed.  There is no significant pelvic  lymphadenopathy or free fluid.  Bone windows demonstrate mild degenerative change of the SI joints bilaterally.   IMPRESSION: 1.  Distal right ureteral stone. 2.  No other significant pathology of the pelvis. 3.  Degenerative changes of the SI joints.  Provider: Imelda PillowSharon Jarriel, Jeannie DoneJenna McLawhon    Assessment & Plan:  Plan [name] does not currently have medications on file.  No orders of the defined types were placed in this encounter.    Problem List Items Addressed This Visit    Preventative health care - Primary    Check labs ghm utd except colon-- pt is refusing-- he states he just "doesn't want it"       Relevant Orders   Basic metabolic panel   CBC with Differential/Platelet   Hepatic function panel   Lipid panel   Microalbumin / creatinine urine ratio   POCT urinalysis dipstick   TSH      Follow-up: Return in about 1 year (around 09/29/2015), or if symptoms worsen or fail to improve.  Loreen FreudYvonne Lowne, DO

## 2014-09-29 NOTE — Patient Instructions (Signed)
Preventive Care for Adults A healthy lifestyle and preventive care can promote health and wellness. Preventive health guidelines for men include the following key practices:  A routine yearly physical is a good way to check with your health care provider about your health and preventative screening. It is a chance to share any concerns and updates on your health and to receive a thorough exam.  Visit your dentist for a routine exam and preventative care every 6 months. Brush your teeth twice a day and floss once a day. Good oral hygiene prevents tooth decay and gum disease.  The frequency of eye exams is based on your age, health, family medical history, use of contact lenses, and other factors. Follow your health care provider's recommendations for frequency of eye exams.  Eat a healthy diet. Foods such as vegetables, fruits, whole grains, low-fat dairy products, and lean protein foods contain the nutrients you need without too many calories. Decrease your intake of foods high in solid fats, added sugars, and salt. Eat the right amount of calories for you.Get information about a proper diet from your health care provider, if necessary.  Regular physical exercise is one of the most important things you can do for your health. Most adults should get at least 150 minutes of moderate-intensity exercise (any activity that increases your heart rate and causes you to sweat) each week. In addition, most adults need muscle-strengthening exercises on 2 or more days a week.  Maintain a healthy weight. The body mass index (BMI) is a screening tool to identify possible weight problems. It provides an estimate of body fat based on height and weight. Your health care provider can find your BMI and can help you achieve or maintain a healthy weight.For adults 20 years and older:  A BMI below 18.5 is considered underweight.  A BMI of 18.5 to 24.9 is normal.  A BMI of 25 to 29.9 is considered overweight.  A BMI  of 30 and above is considered obese.  Maintain normal blood lipids and cholesterol levels by exercising and minimizing your intake of saturated fat. Eat a balanced diet with plenty of fruit and vegetables. Blood tests for lipids and cholesterol should begin at age 50 and be repeated every 5 years. If your lipid or cholesterol levels are high, you are over 50, or you are at high risk for heart disease, you may need your cholesterol levels checked more frequently.Ongoing high lipid and cholesterol levels should be treated with medicines if diet and exercise are not working.  If you smoke, find out from your health care provider how to quit. If you do not use tobacco, do not start.  Lung cancer screening is recommended for adults aged 73-80 years who are at high risk for developing lung cancer because of a history of smoking. A yearly low-dose CT scan of the lungs is recommended for people who have at least a 30-pack-year history of smoking and are a current smoker or have quit within the past 15 years. A pack year of smoking is smoking an average of 1 pack of cigarettes a day for 1 year (for example: 1 pack a day for 30 years or 2 packs a day for 15 years). Yearly screening should continue until the smoker has stopped smoking for at least 15 years. Yearly screening should be stopped for people who develop a health problem that would prevent them from having lung cancer treatment.  If you choose to drink alcohol, do not have more than  2 drinks per day. One drink is considered to be 12 ounces (355 mL) of beer, 5 ounces (148 mL) of wine, or 1.5 ounces (44 mL) of liquor.  Avoid use of street drugs. Do not share needles with anyone. Ask for help if you need support or instructions about stopping the use of drugs.  High blood pressure causes heart disease and increases the risk of stroke. Your blood pressure should be checked at least every 1-2 years. Ongoing high blood pressure should be treated with  medicines, if weight loss and exercise are not effective.  If you are 45-79 years old, ask your health care provider if you should take aspirin to prevent heart disease.  Diabetes screening involves taking a blood sample to check your fasting blood sugar level. This should be done once every 3 years, after age 45, if you are within normal weight and without risk factors for diabetes. Testing should be considered at a younger age or be carried out more frequently if you are overweight and have at least 1 risk factor for diabetes.  Colorectal cancer can be detected and often prevented. Most routine colorectal cancer screening begins at the age of 50 and continues through age 75. However, your health care provider may recommend screening at an earlier age if you have risk factors for colon cancer. On a yearly basis, your health care provider may provide home test kits to check for hidden blood in the stool. Use of a small camera at the end of a tube to directly examine the colon (sigmoidoscopy or colonoscopy) can detect the earliest forms of colorectal cancer. Talk to your health care provider about this at age 50, when routine screening begins. Direct exam of the colon should be repeated every 5-10 years through age 75, unless early forms of precancerous polyps or small growths are found.  People who are at an increased risk for hepatitis B should be screened for this virus. You are considered at high risk for hepatitis B if:  You were born in a country where hepatitis B occurs often. Talk with your health care provider about which countries are considered high risk.  Your parents were born in a high-risk country and you have not received a shot to protect against hepatitis B (hepatitis B vaccine).  You have HIV or AIDS.  You use needles to inject street drugs.  You live with, or have sex with, someone who has hepatitis B.  You are a man who has sex with other men (MSM).  You get hemodialysis  treatment.  You take certain medicines for conditions such as cancer, organ transplantation, and autoimmune conditions.  Hepatitis C blood testing is recommended for all people born from 1945 through 1965 and any individual with known risks for hepatitis C.  Practice safe sex. Use condoms and avoid high-risk sexual practices to reduce the spread of sexually transmitted infections (STIs). STIs include gonorrhea, chlamydia, syphilis, trichomonas, herpes, HPV, and human immunodeficiency virus (HIV). Herpes, HIV, and HPV are viral illnesses that have no cure. They can result in disability, cancer, and death.  If you are at risk of being infected with HIV, it is recommended that you take a prescription medicine daily to prevent HIV infection. This is called preexposure prophylaxis (PrEP). You are considered at risk if:  You are a man who has sex with other men (MSM) and have other risk factors.  You are a heterosexual man, are sexually active, and are at increased risk for HIV infection.    You take drugs by injection.  You are sexually active with a partner who has HIV.  Talk with your health care provider about whether you are at high risk of being infected with HIV. If you choose to begin PrEP, you should first be tested for HIV. You should then be tested every 3 months for as long as you are taking PrEP.  A one-time screening for abdominal aortic aneurysm (AAA) and surgical repair of large AAAs by ultrasound are recommended for men ages 32 to 67 years who are current or former smokers.  Healthy men should no longer receive prostate-specific antigen (PSA) blood tests as part of routine cancer screening. Talk with your health care provider about prostate cancer screening.  Testicular cancer screening is not recommended for adult males who have no symptoms. Screening includes self-exam, a health care provider exam, and other screening tests. Consult with your health care provider about any symptoms  you have or any concerns you have about testicular cancer.  Use sunscreen. Apply sunscreen liberally and repeatedly throughout the day. You should seek shade when your shadow is shorter than you. Protect yourself by wearing long sleeves, pants, a wide-brimmed hat, and sunglasses year round, whenever you are outdoors.  Once a month, do a whole-body skin exam, using a mirror to look at the skin on your back. Tell your health care provider about new moles, moles that have irregular borders, moles that are larger than a pencil eraser, or moles that have changed in shape or color.  Stay current with required vaccines (immunizations).  Influenza vaccine. All adults should be immunized every year.  Tetanus, diphtheria, and acellular pertussis (Td, Tdap) vaccine. An adult who has not previously received Tdap or who does not know his vaccine status should receive 1 dose of Tdap. This initial dose should be followed by tetanus and diphtheria toxoids (Td) booster doses every 10 years. Adults with an unknown or incomplete history of completing a 3-dose immunization series with Td-containing vaccines should begin or complete a primary immunization series including a Tdap dose. Adults should receive a Td booster every 10 years.  Varicella vaccine. An adult without evidence of immunity to varicella should receive 2 doses or a second dose if he has previously received 1 dose.  Human papillomavirus (HPV) vaccine. Males aged 68-21 years who have not received the vaccine previously should receive the 3-dose series. Males aged 22-26 years may be immunized. Immunization is recommended through the age of 6 years for any male who has sex with males and did not get any or all doses earlier. Immunization is recommended for any person with an immunocompromised condition through the age of 49 years if he did not get any or all doses earlier. During the 3-dose series, the second dose should be obtained 4-8 weeks after the first  dose. The third dose should be obtained 24 weeks after the first dose and 16 weeks after the second dose.  Zoster vaccine. One dose is recommended for adults aged 50 years or older unless certain conditions are present.  Measles, mumps, and rubella (MMR) vaccine. Adults born before [birth year] generally are considered immune to measles and mumps. Adults born in [birth year] or later should have 1 or more doses of MMR vaccine unless there is a contraindication to the vaccine or there is laboratory evidence of immunity to each of the three diseases. A routine second dose of MMR vaccine should be obtained at least 28 days after the first dose for students attending postsecondary  schools, health care workers, or international travelers. People who received inactivated measles vaccine or an unknown type of measles vaccine during 1963-1967 should receive 2 doses of MMR vaccine. People who received inactivated mumps vaccine or an unknown type of mumps vaccine before 1979 and are at high risk for mumps infection should consider immunization with 2 doses of MMR vaccine. Unvaccinated health care workers born before 1957 who lack laboratory evidence of measles, mumps, or rubella immunity or laboratory confirmation of disease should consider measles and mumps immunization with 2 doses of MMR vaccine or rubella immunization with 1 dose of MMR vaccine.  Pneumococcal 13-valent conjugate (PCV13) vaccine. When indicated, a person who is uncertain of his immunization history and has no record of immunization should receive the PCV13 vaccine. An adult aged 19 years or older who has certain medical conditions and has not been previously immunized should receive 1 dose of PCV13 vaccine. This PCV13 should be followed with a dose of pneumococcal polysaccharide (PPSV23) vaccine. The PPSV23 vaccine dose should be obtained at least 8 weeks after the dose of PCV13 vaccine. An adult aged 19 years or older who has certain medical conditions and  previously received 1 or more doses of PPSV23 vaccine should receive 1 dose of PCV13. The PCV13 vaccine dose should be obtained 1 or more years after the last PPSV23 vaccine dose.  Pneumococcal polysaccharide (PPSV23) vaccine. When PCV13 is also indicated, PCV13 should be obtained first. All adults aged 65 years and older should be immunized. An adult younger than age 65 years who has certain medical conditions should be immunized. Any person who resides in a nursing home or long-term care facility should be immunized. An adult smoker should be immunized. People with an immunocompromised condition and certain other conditions should receive both PCV13 and PPSV23 vaccines. People with human immunodeficiency virus (HIV) infection should be immunized as soon as possible after diagnosis. Immunization during chemotherapy or radiation therapy should be avoided. Routine use of PPSV23 vaccine is not recommended for American Indians, [place] Natives, or people younger than 65 years unless there are medical conditions that require PPSV23 vaccine. When indicated, people who have unknown immunization and have no record of immunization should receive PPSV23 vaccine. One-time revaccination 5 years after the first dose of PPSV23 is recommended for people aged 19-64 years who have chronic kidney failure, nephrotic syndrome, asplenia, or immunocompromised conditions. People who received 1-2 doses of PPSV23 before age 65 years should receive another dose of PPSV23 vaccine at age 65 years or later if at least 5 years have passed since the previous dose. Doses of PPSV23 are not needed for people immunized with PPSV23 at or after age 65 years.  Meningococcal vaccine. Adults with asplenia or persistent complement component deficiencies should receive 2 doses of quadrivalent meningococcal conjugate (MenACWY-D) vaccine. The doses should be obtained at least 2 months apart. Microbiologists working with certain meningococcal bacteria,  military recruits, people at risk during an outbreak, and people who travel to or live in countries with a high rate of meningitis should be immunized. A first-year college student up through age 21 years who is living in a residence hall should receive a dose if he did not receive a dose on or after his 16th birthday. Adults who have certain high-risk conditions should receive one or more doses of vaccine.  Hepatitis A vaccine. Adults who wish to be protected from this disease, have certain high-risk conditions, work with hepatitis A-infected animals, work in hepatitis A research labs, or   travel to or work in countries with a high rate of hepatitis A should be immunized. Adults who were previously unvaccinated and who anticipate close contact with an international adoptee during the first 60 days after arrival in the [place] from a country with a high rate of hepatitis A should be immunized.  Hepatitis B vaccine. Adults should be immunized if they wish to be protected from this disease, have certain high-risk conditions, may be exposed to blood or other infectious body fluids, are household contacts or sex partners of hepatitis B positive people, are clients or workers in certain care facilities, or travel to or work in countries with a high rate of hepatitis B.  Haemophilus influenzae type b (Hib) vaccine. A previously unvaccinated person with asplenia or sickle cell disease or having a scheduled splenectomy should receive 1 dose of Hib vaccine. Regardless of previous immunization, a recipient of a hematopoietic stem cell transplant should receive a 3-dose series 6-12 months after his successful transplant. Hib vaccine is not recommended for adults with HIV infection. Preventive Service / Frequency Ages 52 to 17  Blood pressure check.** / Every 1 to 2 years.  Lipid and cholesterol check.** / Every 5 years beginning at age 69.  Hepatitis C blood test.** / For any individual with known risks for  hepatitis C.  Skin self-exam. / Monthly.  Influenza vaccine. / Every year.  Tetanus, diphtheria, and acellular pertussis (Tdap, Td) vaccine.** / Consult your health care provider. 1 dose of Td every 10 years.  Varicella vaccine.** / Consult your health care provider.  HPV vaccine. / 3 doses over 6 months, if 72 or younger.  Measles, mumps, rubella (MMR) vaccine.** / You need at least 1 dose of MMR if you were born in 1957 or later. You may also need a second dose.  Pneumococcal 13-valent conjugate (PCV13) vaccine.** / Consult your health care provider.  Pneumococcal polysaccharide (PPSV23) vaccine.** / 1 to 2 doses if you smoke cigarettes or if you have certain conditions.  Meningococcal vaccine.** / 1 dose if you are age 35 to 60 years and a Market researcher living in a residence hall, or have one of several medical conditions. You may also need additional booster doses.  Hepatitis A vaccine.** / Consult your health care provider.  Hepatitis B vaccine.** / Consult your health care provider.  Haemophilus influenzae type b (Hib) vaccine.** / Consult your health care provider. Ages 35 to 8  Blood pressure check.** / Every 1 to 2 years.  Lipid and cholesterol check.** / Every 5 years beginning at age 57.  Lung cancer screening. / Every year if you are aged 44-80 years and have a 30-pack-year history of smoking and currently smoke or have quit within the past 15 years. Yearly screening is stopped once you have quit smoking for at least 15 years or develop a health problem that would prevent you from having lung cancer treatment.  Fecal occult blood test (FOBT) of stool. / Every year beginning at age 55 and continuing until age 73. You may not have to do this test if you get a colonoscopy every 10 years.  Flexible sigmoidoscopy** or colonoscopy.** / Every 5 years for a flexible sigmoidoscopy or every 10 years for a colonoscopy beginning at age 28 and continuing until age  1.  Hepatitis C blood test.** / For all people born from [birth year] through 1965 and any individual with known risks for hepatitis C.  Skin self-exam. / Monthly.  Influenza vaccine. / Every  year.  Tetanus, diphtheria, and acellular pertussis (Tdap/Td) vaccine.** / Consult your health care provider. 1 dose of Td every 10 years.  Varicella vaccine.** / Consult your health care provider.  Zoster vaccine.** / 1 dose for adults aged 53 years or older.  Measles, mumps, rubella (MMR) vaccine.** / You need at least 1 dose of MMR if you were born in 1957 or later. You may also need a second dose.  Pneumococcal 13-valent conjugate (PCV13) vaccine.** / Consult your health care provider.  Pneumococcal polysaccharide (PPSV23) vaccine.** / 1 to 2 doses if you smoke cigarettes or if you have certain conditions.  Meningococcal vaccine.** / Consult your health care provider.  Hepatitis A vaccine.** / Consult your health care provider.  Hepatitis B vaccine.** / Consult your health care provider.  Haemophilus influenzae type b (Hib) vaccine.** / Consult your health care provider. Ages 77 and over  Blood pressure check.** / Every 1 to 2 years.  Lipid and cholesterol check.**/ Every 5 years beginning at age 85.  Lung cancer screening. / Every year if you are aged 55-80 years and have a 30-pack-year history of smoking and currently smoke or have quit within the past 15 years. Yearly screening is stopped once you have quit smoking for at least 15 years or develop a health problem that would prevent you from having lung cancer treatment.  Fecal occult blood test (FOBT) of stool. / Every year beginning at age 33 and continuing until age 11. You may not have to do this test if you get a colonoscopy every 10 years.  Flexible sigmoidoscopy** or colonoscopy.** / Every 5 years for a flexible sigmoidoscopy or every 10 years for a colonoscopy beginning at age 28 and continuing until age 73.  Hepatitis C blood  test.** / For all people born from [birth year] through 1965 and any individual with known risks for hepatitis C.  Abdominal aortic aneurysm (AAA) screening.** / A one-time screening for ages 50 to 27 years who are current or former smokers.  Skin self-exam. / Monthly.  Influenza vaccine. / Every year.  Tetanus, diphtheria, and acellular pertussis (Tdap/Td) vaccine.** / 1 dose of Td every 10 years.  Varicella vaccine.** / Consult your health care provider.  Zoster vaccine.** / 1 dose for adults aged 34 years or older.  Pneumococcal 13-valent conjugate (PCV13) vaccine.** / Consult your health care provider.  Pneumococcal polysaccharide (PPSV23) vaccine.** / 1 dose for all adults aged 63 years and older.  Meningococcal vaccine.** / Consult your health care provider.  Hepatitis A vaccine.** / Consult your health care provider.  Hepatitis B vaccine.** / Consult your health care provider.  Haemophilus influenzae type b (Hib) vaccine.** / Consult your health care provider. **Family history and personal history of risk and conditions may change your health care provider's recommendations. Document Released: 08/02/2001 Document Revised: 06/11/2013 Document Reviewed: 11/01/2010 [facility] Patient Information 2015 [place], [place]. This information is not intended to replace advice given to you by your health care provider. Make sure you discuss any questions you have with your health care provider.

## 2014-09-29 NOTE — Assessment & Plan Note (Signed)
Check labs ghm utd except colon-- pt is refusing-- he states he just "doesn't want it"

## 2014-09-29 NOTE — Progress Notes (Signed)
Pre visit review using our clinic review tool, if applicable. No additional management support is needed unless otherwise documented below in the visit note. 

## 2014-09-30 LAB — CBC WITH DIFFERENTIAL/PLATELET
Basophils Absolute: 0 10*3/uL (ref 0.0–0.1)
Basophils Relative: 0.4 % (ref 0.0–3.0)
Eosinophils Absolute: 0.2 10*3/uL (ref 0.0–0.7)
Eosinophils Relative: 2.1 % (ref 0.0–5.0)
HCT: 42.1 % (ref 39.0–52.0)
Hemoglobin: 14 g/dL (ref 13.0–17.0)
Lymphocytes Relative: 30 % (ref 12.0–46.0)
Lymphs Abs: 3 10*3/uL (ref 0.7–4.0)
MCHC: 33.4 g/dL (ref 30.0–36.0)
MCV: 85.8 fl (ref 78.0–100.0)
Monocytes Absolute: 0.7 10*3/uL (ref 0.1–1.0)
Monocytes Relative: 6.6 % (ref 3.0–12.0)
Neutro Abs: 6.2 10*3/uL (ref 1.4–7.7)
Neutrophils Relative %: 60.9 % (ref 43.0–77.0)
Platelets: 253 10*3/uL (ref 150.0–400.0)
RBC: 4.91 Mil/uL (ref 4.22–5.81)
RDW: 14.2 % (ref 11.5–15.5)
WBC: 10.2 10*3/uL (ref 4.0–10.5)

## 2014-09-30 LAB — BASIC METABOLIC PANEL
BUN: 18 mg/dL (ref 6–23)
CO2: 26 mEq/L (ref 19–32)
Calcium: 9.8 mg/dL (ref 8.4–10.5)
Chloride: 102 mEq/L (ref 96–112)
Creatinine, Ser: 1 mg/dL (ref 0.40–1.50)
GFR: 82.9 mL/min (ref >60.00)
Glucose, Bld: 90 mg/dL (ref 70–99)
Potassium: 3.7 mEq/L (ref 3.5–5.1)
Sodium: 139 mEq/L (ref 135–145)

## 2014-09-30 LAB — MICROALBUMIN / CREATININE URINE RATIO
Creatinine,U: 290.4 mg/dL
Microalb Creat Ratio: 0.4 mg/g (ref 0.0–30.0)
Microalb, Ur: 1.1 mg/dL (ref 0.0–1.9)

## 2014-09-30 LAB — LIPID PANEL
Cholesterol: 227 mg/dL — ABNORMAL HIGH (ref 0–200)
HDL: 50.7 mg/dL (ref >39.00)
LDL Cholesterol: 144 mg/dL — ABNORMAL HIGH (ref 0–99)
NonHDL: 176.3
Total CHOL/HDL Ratio: 4
Triglycerides: 162 mg/dL — ABNORMAL HIGH (ref 0.0–149.0)
VLDL: 32.4 mg/dL (ref 0.0–40.0)

## 2014-09-30 LAB — TSH: TSH: 1.54 u[IU]/mL (ref 0.35–4.50)

## 2014-09-30 LAB — HEPATIC FUNCTION PANEL
ALT: 25 U/L (ref 0–53)
AST: 19 U/L (ref 0–37)
Albumin: 4.5 g/dL (ref 3.5–5.2)
Alkaline Phosphatase: 59 U/L (ref 39–117)
Bilirubin, Direct: 0.1 mg/dL (ref 0.0–0.3)
Total Bilirubin: 0.5 mg/dL (ref 0.2–1.2)
Total Protein: 8 g/dL (ref 6.0–8.3)

## 2014-10-01 LAB — POCT URINALYSIS DIPSTICK
Bilirubin, UA: NEGATIVE
Blood, UA: NEGATIVE
Glucose, UA: NEGATIVE
Ketones, UA: NEGATIVE
Leukocytes, UA: NEGATIVE
Nitrite, UA: NEGATIVE
Protein, UA: NEGATIVE
Spec Grav, UA: >=1.03
Urobilinogen, UA: 0.2
pH, UA: 6

## 2015-06-23 NOTE — Progress Notes (Signed)
     HPI: 55 yo male for evaluation of tachycardia.   No current outpatient prescriptions on file.   No current facility-administered medications for this visit.    Allergies  Allergen Reactions  . Asa [Aspirin] Anaphylaxis and Swelling    Lips swelling    Past Medical History  Diagnosis Date  . Hyperlipemia   . Hypertension   . Kidney stones 2013  . Chickenpox     Past Surgical History  Procedure Laterality Date  . No past surgeries      Social History   Social History  . Marital Status: Married    Spouse Name: N/A  . Number of Children: N/A  . Years of Education: N/A   Occupational History  . Not on file.   Social History Main Topics  . Smoking status: Former Smoker -- 2.00 packs/day for 35 years    Types: Cigarettes    Quit date: 09/28/2013  . Smokeless tobacco: Never Used  . Alcohol Use: 0.0 oz/week    0 Standard drinks or equivalent per week     Comment: Occasionall  . Drug Use: No  . Sexual Activity:    Partners: Female   Other Topics Concern  . Not on file   Social History Narrative    Family History  Problem Relation Age of Onset  . Arthritis Mother   . Arthritis Father   . Hyperlipidemia Father   . Diabetes Brother     ROS: no fevers or chills, productive cough, hemoptysis, dysphasia, odynophagia, melena, hematochezia, dysuria, hematuria, rash, seizure activity, orthopnea, PND, pedal edema, claudication. Remaining systems are negative.  Physical Exam:   There were no vitals taken for this visit.  General:  Well developed/well nourished in NAD Skin warm/dry Patient not depressed No peripheral clubbing Back-normal HEENT-normal/normal eyelids Neck supple/normal carotid upstroke bilaterally; no bruits; no JVD; no thyromegaly chest - CTA/ normal expansion CV - RRR/normal S1 and S2; no murmurs, rubs or gallops;  PMI nondisplaced Abdomen -NT/ND, no HSM, no mass, + bowel sounds, no bruit 2+ femoral pulses, no bruits Ext-no edema,  chords, 2+ DP Neuro-grossly nonfocal  ECG    This encounter was created in error - please disregard.

## 2015-06-24 ENCOUNTER — Encounter: Payer: BLUE CROSS/BLUE SHIELD | Admitting: Cardiology

## 2015-09-03 ENCOUNTER — Encounter: Payer: Self-pay | Admitting: Family Medicine

## 2015-09-03 ENCOUNTER — Ambulatory Visit (INDEPENDENT_AMBULATORY_CARE_PROVIDER_SITE_OTHER): Payer: BLUE CROSS/BLUE SHIELD | Admitting: Family Medicine

## 2015-09-03 ENCOUNTER — Ambulatory Visit (HOSPITAL_BASED_OUTPATIENT_CLINIC_OR_DEPARTMENT_OTHER)
Admission: RE | Admit: 2015-09-03 | Discharge: 2015-09-03 | Disposition: A | Discharge status: Home / Self Care | Payer: BLUE CROSS/BLUE SHIELD | Source: Ambulatory Visit | Attending: Family Medicine | Admitting: Family Medicine

## 2015-09-03 VITALS — BP 180/92 | HR 93 | Temp 98.3°F | Wt 274.0 lb

## 2015-09-03 DIAGNOSIS — I517 Cardiomegaly: Secondary | ICD-10-CM | POA: Insufficient documentation

## 2015-09-03 DIAGNOSIS — R0609 Other forms of dyspnea: Secondary | ICD-10-CM | POA: Diagnosis not present

## 2015-09-03 DIAGNOSIS — R079 Chest pain, unspecified: Secondary | ICD-10-CM | POA: Insufficient documentation

## 2015-09-03 DIAGNOSIS — I10 Essential (primary) hypertension: Secondary | ICD-10-CM

## 2015-09-03 DIAGNOSIS — R0600 Dyspnea, unspecified: Secondary | ICD-10-CM

## 2015-09-03 LAB — POCT URINALYSIS DIPSTICK
Bilirubin, UA: NEGATIVE
Blood, UA: NEGATIVE
Glucose, UA: NEGATIVE
Ketones, UA: NEGATIVE
Leukocytes, UA: NEGATIVE
Nitrite, UA: NEGATIVE
Protein, UA: NEGATIVE
Spec Grav, UA: >=1.03
Urobilinogen, UA: 0.2
pH, UA: 6

## 2015-09-03 LAB — TROPONIN I: TNIDX: 0.03 ug/l (ref 0.00–0.06)

## 2015-09-03 MED ORDER — LISINOPRIL-HYDROCHLOROTHIAZIDE 10-12.5 MG PO TABS: 1.0000 | ORAL_TABLET | Freq: Every day | ORAL | Status: DC

## 2015-09-03 NOTE — Progress Notes (Signed)
Pre visit review using our clinic review tool, if applicable. No additional management support is needed unless otherwise documented below in the visit note. 

## 2015-09-03 NOTE — Progress Notes (Addendum)
Patient ID: Tamela Gammon, male    DOB: Apr 08, 1961  Age: 55 y.o. MRN: [id number]    Subjective:  Subjective HPI Dennis Hopkins presents for c/o elevated bp and chest pains x 3 months.  Cp occurs daily and usually lasts a few sec only.  No Nausea or vomiting.  No diaphoresis. , no dizziness or weakness.     Pain radiateds down L side of neck and down L arm --sob occurs with exertion but also happens while he is laying in bed and as soon as he stands up and lets fan hit his face he is fine.   Review of Systems  Constitutional: Negative for diaphoresis, appetite change, fatigue and unexpected weight change.  Eyes: Negative for pain, redness and visual disturbance.  Respiratory: Positive for shortness of breath. Negative for cough, chest tightness and wheezing.   Cardiovascular: Positive for chest pain. Negative for palpitations and leg swelling.  Endocrine: Negative for cold intolerance, heat intolerance, polydipsia, polyphagia and polyuria.  Genitourinary: Negative for dysuria, frequency and difficulty urinating.  Neurological: Negative for dizziness, light-headedness, numbness and headaches.  Psychiatric/Behavioral: Negative for decreased concentration and agitation. The patient is not nervous/anxious.     History Past Medical History  Diagnosis Date  . Hyperlipemia   . Hypertension   . Kidney stones 2013  . Chickenpox     He has past surgical history that includes No past surgeries.   His family history includes Arthritis in his father and mother; Diabetes in his brother; Hyperlipidemia in his father.He reports that he quit smoking about 20 months ago. His smoking use included Cigarettes. He has a 70 pack-year smoking history. He has never used smokeless tobacco. He reports that he drinks alcohol. He reports that he does not use illicit drugs.  No current outpatient prescriptions on file prior to visit.   No current facility-administered medications on file prior to visit.       Objective:  Objective Physical Exam  Constitutional: He is oriented to person, place, and time. Vital signs are normal. He appears well-developed and well-nourished. He is sleeping.  HENT:  Head: Normocephalic and atraumatic.  Mouth/Throat: Oropharynx is clear and moist.  Eyes: EOM are normal. Pupils are equal, round, and reactive to light.  Neck: Normal range of motion. Neck supple. No thyromegaly present.  Cardiovascular: Normal rate and regular rhythm.   No murmur heard. Pulmonary/Chest: Effort normal and breath sounds normal. No respiratory distress. He has no wheezes. He has no rales. He exhibits no tenderness.  Musculoskeletal: He exhibits no edema or tenderness.  Neurological: He is alert and oriented to person, place, and time.  Skin: Skin is warm and dry.  Psychiatric: He has a normal mood and affect. His behavior is normal. Judgment and thought content normal.  Nursing note and vitals reviewed.  BP 180/92 mmHg  Pulse 93  Temp(Src) 98.3 F (36.8 C) (Oral)  Wt 274 lb (124.286 kg)  SpO2 98% Wt Readings from Last 3 Encounters:  09/03/15 274 lb (124.286 kg)  09/29/14 258 lb (117.028 kg)  09/26/13 244 lb (110.678 kg)     Lab Results  Component Value Date   WBC 10.2 09/29/2014   HGB 14.0 09/29/2014   HCT 42.1 09/29/2014   PLT 253.0 09/29/2014   GLUCOSE 90 09/29/2014   CHOL 227* 09/29/2014   TRIG 162.0* 09/29/2014   HDL 50.70 09/29/2014   LDLCALC 144* 09/29/2014   ALT 25 09/29/2014   AST 19 09/29/2014   NA 139 09/29/2014  K 3.7 09/29/2014   CL 102 09/29/2014   CREATININE 1.00 09/29/2014   BUN 18 09/29/2014   CO2 26 09/29/2014   TSH 1.54 09/29/2014   MICROALBUR 1.1 09/29/2014    Ct Abdomen Wo Contrast  08/12/2008  Clinical Data:  Right flank pain.  Hematuria.  CT ABDOMEN AND PELVIS WITHOUT CONTRAST  Technique:  Multidetector CT imaging of the abdomen and pelvis was performed following the standard protocol without intravenous contrast.  Comparison:  None  available.  CT ABDOMEN  Findings:  Mild dependent atelectasis is present at the lung bases bilaterally.  The heart size is normal.  There is no significant pleural or pericardial effusion.  The liver and spleen are within normal limits.  The stomach is unremarkable.  The pancreas, common bile duct, and gallbladder are normal.  The adrenal glands and left kidney are within normal limits.  There is mild to moderate right-sided hydronephrosis.  The right ureter is dilated throughout its course into the pelvis where a 4 mm stone is obstructing ureter just above ureteral vesicle junction.  No additional stones are identified within the right kidney.  Minimal atherosclerotic calcifications are noted in the aorta without aneurysm.  Bone windows demonstrate a vacuum phenomenon at L4-5 with a large Schmorl's node invaginating into the superior endplate of L5.  Multilevel facet degenerative changes evident.  IMPRESSION: 1.  Obstructing 4 mm distal right ureteral stone, just above the right UVJ with mild to moderate hydroureter nephrosis. 2.  No additional nephrolithiasis evident. 3.  Degenerative changes of the lumbar spine including a large superior endplate Schmorl's node at L5.  CT PELVIS  Findings:  The sigmoid colon is mostly collapsed.  There is some air and stool within the ascending and transverse colon.  The appendix is visualized and within normal limits.  The urinary bladder is mostly collapsed.  There is no significant pelvic lymphadenopathy or free fluid.  Bone windows demonstrate mild degenerative change of the SI joints bilaterally.  IMPRESSION: 1.  Distal right ureteral stone. 2.  No other significant pathology of the pelvis. 3.  Degenerative changes of the SI joints. Provider: Luberta Mutter, Jenna McLawhon  Ct Pelvis Wo Contrast  08/12/2008  Clinical Data:  Right flank pain.  Hematuria.  CT ABDOMEN AND PELVIS WITHOUT CONTRAST  Technique:  Multidetector CT imaging of the abdomen and pelvis was performed  following the standard protocol without intravenous contrast.  Comparison:  None available.  CT ABDOMEN  Findings:  Mild dependent atelectasis is present at the lung bases bilaterally.  The heart size is normal.  There is no significant pleural or pericardial effusion.  The liver and spleen are within normal limits.  The stomach is unremarkable.  The pancreas, common bile duct, and gallbladder are normal.  The adrenal glands and left kidney are within normal limits.  There is mild to moderate right-sided hydronephrosis.  The right ureter is dilated throughout its course into the pelvis where a 4 mm stone is obstructing ureter just above ureteral vesicle junction.  No additional stones are identified within the right kidney.  Minimal atherosclerotic calcifications are noted in the aorta without aneurysm.  Bone windows demonstrate a vacuum phenomenon at L4-5 with a large Schmorl's node invaginating into the superior endplate of L5.  Multilevel facet degenerative changes evident.  IMPRESSION: 1.  Obstructing 4 mm distal right ureteral stone, just above the right UVJ with mild to moderate hydroureter nephrosis. 2.  No additional nephrolithiasis evident. 3.  Degenerative changes of the lumbar  spine including a large superior endplate Schmorl's node at L5.  CT PELVIS  Findings:  The sigmoid colon is mostly collapsed.  There is some air and stool within the ascending and transverse colon.  The appendix is visualized and within normal limits.  The urinary bladder is mostly collapsed.  There is no significant pelvic lymphadenopathy or free fluid.  Bone windows demonstrate mild degenerative change of the SI joints bilaterally.  IMPRESSION: 1.  Distal right ureteral stone. 2.  No other significant pathology of the pelvis. 3.  Degenerative changes of the SI joints. Provider: Luberta Mutter, Eliezer Lofts McLawhon  ekg--  Nsr,  T wave inversion, nonspecific st depression   Assessment & Plan:  Plan I am having Dennis Hopkins start on  lisinopril-hydrochlorothiazide.  Meds ordered this encounter  Medications  . lisinopril-hydrochlorothiazide (PRINZIDE,ZESTORETIC) 10-12.5 MG tablet    Sig: Take 1 tablet by mouth daily.    Dispense:  90 tablet    Refill:  3    Problem List Items Addressed This Visit    None    Visit Diagnoses    Chest pain, unspecified chest pain type    -  Primary    Relevant Orders    EKG 12-Lead (Completed)    Ambulatory referral to Cardiology    Echo stress    CBC with Differential/Platelet    Comp Met (CMET)    POCT urinalysis dipstick (Completed)    Lipid panel    Troponin I (Completed)    DG Chest 2 View (Completed)    Essential hypertension        Relevant Medications    lisinopril-hydrochlorothiazide (PRINZIDE,ZESTORETIC) 10-12.5 MG tablet    Other Relevant Orders    Echo stress    CBC with Differential/Platelet    Comp Met (CMET)    POCT urinalysis dipstick (Completed)    Lipid panel    Troponin I (Completed)    DOE (dyspnea on exertion)        Relevant Orders    Echo stress    CBC with Differential/Platelet    Comp Met (CMET)    POCT urinalysis dipstick (Completed)    Lipid panel    Troponin I (Completed)     pt refusing to go to ER.  Labs were drawn stat---explained to pt importance of goint to ER if chest pain occurs again. Pt states he can not go to the er because he can not miss work.  Pt states it doesn't matter if he dies because then at least his wife would get life insurance.    Follow-up: Return in about 2 weeks (around 09/17/2015), or if symptoms worsen or fail to improve, for hypertension.  Garnet Koyanagi, DO

## 2015-09-03 NOTE — Patient Instructions (Signed)
Nonspecific Chest Pain  °Chest pain can be caused by many different conditions. There is always a chance that your pain could be related to something serious, such as a heart attack or a blood clot in your lungs. Chest pain can also be caused by conditions that are not life-threatening. If you have chest pain, it is very important to follow up with your health care provider. °CAUSES  °Chest pain can be caused by: °· Heartburn. °· Pneumonia or bronchitis. °· Anxiety or stress. °· Inflammation around your heart (pericarditis) or lung (pleuritis or pleurisy). °· A blood clot in your lung. °· A collapsed lung (pneumothorax). It can develop suddenly on its own (spontaneous pneumothorax) or from trauma to the chest. °· Shingles infection (varicella-zoster virus). °· Heart attack. °· Damage to the bones, muscles, and cartilage that make up your chest wall. This can include: °¨ Bruised bones due to injury. °¨ Strained muscles or cartilage due to frequent or repeated coughing or overwork. °¨ Fracture to one or more ribs. °¨ Sore cartilage due to inflammation (costochondritis). °RISK FACTORS  °Risk factors for chest pain may include: °· Activities that increase your risk for trauma or injury to your chest. °· Respiratory infections or conditions that cause frequent coughing. °· Medical conditions or overeating that can cause heartburn. °· Heart disease or family history of heart disease. °· Conditions or health behaviors that increase your risk of developing a blood clot. °· Having had chicken pox (varicella zoster). °SIGNS AND SYMPTOMS °Chest pain can feel like: °· Burning or tingling on the surface of your chest or deep in your chest. °· Crushing, pressure, aching, or squeezing pain. °· Dull or sharp pain that is worse when you move, cough, or take a deep breath. °· Pain that is also felt in your back, neck, shoulder, or arm, or pain that spreads to any of these areas. °Your chest pain may come and go, or it may stay  constant. °DIAGNOSIS °Lab tests or other studies may be needed to find the cause of your pain. Your health care provider may have you take a test called an ambulatory ECG (electrocardiogram). An ECG records your heartbeat patterns at the time the test is performed. You may also have other tests, such as: °· Transthoracic echocardiogram (TTE). During echocardiography, sound waves are used to create a picture of all of the heart structures and to look at how blood flows through your heart. °· Transesophageal echocardiogram (TEE). This is a more advanced imaging test that obtains images from inside your body. It allows your health care provider to see your heart in finer detail. °· Cardiac monitoring. This allows your health care provider to monitor your heart rate and rhythm in real time. °· Holter monitor. This is a portable device that records your heartbeat and can help to diagnose abnormal heartbeats. It allows your health care provider to track your heart activity for several days, if needed. °· Stress tests. These can be done through exercise or by taking medicine that makes your heart beat more quickly. °· Blood tests. °· Imaging tests. °TREATMENT  °Your treatment depends on what is causing your chest pain. Treatment may include: °· Medicines. These may include: °¨ Acid blockers for heartburn. °¨ Anti-inflammatory medicine. °¨ Pain medicine for inflammatory conditions. °¨ Antibiotic medicine, if an infection is present. °¨ Medicines to dissolve blood clots. °¨ Medicines to treat coronary artery disease. °· Supportive care for conditions that do not require medicines. This may include: °¨ Resting. °¨ Applying heat   or cold packs to injured areas. °¨ Limiting activities until pain decreases. °HOME CARE INSTRUCTIONS °· If you were prescribed an antibiotic medicine, finish it all even if you start to feel better. °· Avoid any activities that bring on chest pain. °· Do not use any tobacco products, including  cigarettes, chewing tobacco, or electronic cigarettes. If you need help quitting, ask your health care provider. °· Do not drink alcohol. °· Take medicines only as directed by your health care provider. °· Keep all follow-up visits as directed by your health care provider. This is important. This includes any further testing if your chest pain does not go away. °· If heartburn is the cause for your chest pain, you may be told to keep your head raised (elevated) while sleeping. This reduces the chance that acid will go from your stomach into your esophagus. °· Make lifestyle changes as directed by your health care provider. These may include: °¨ Getting regular exercise. Ask your health care provider to suggest some activities that are safe for you. °¨ Eating a heart-healthy diet. A registered dietitian can help you to learn healthy eating options. °¨ Maintaining a healthy weight. °¨ Managing diabetes, if necessary. °¨ Reducing stress. °SEEK MEDICAL CARE IF: °· Your chest pain does not go away after treatment. °· You have a rash with blisters on your chest. °· You have a fever. °SEEK IMMEDIATE MEDICAL CARE IF:  °· Your chest pain is worse. °· You have an increasing cough, or you cough up blood. °· You have severe abdominal pain. °· You have severe weakness. °· You faint. °· You have chills. °· You have sudden, unexplained chest discomfort. °· You have sudden, unexplained discomfort in your arms, back, neck, or jaw. °· You have shortness of breath at any time. °· You suddenly start to sweat, or your skin gets clammy. °· You feel nauseous or you vomit. °· You suddenly feel light-headed or dizzy. °· Your heart begins to beat quickly, or it feels like it is skipping beats. °These symptoms may represent a serious problem that is an emergency. Do not wait to see if the symptoms will go away. Get medical help right away. Call your local emergency services (911 in the [place] Do not drive yourself to the hospital. °  °This  information is not intended to replace advice given to you by your health care provider. Make sure you discuss any questions you have with your health care provider. °  °Document Released: 03/16/2005 Document Revised: 06/27/2014 Document Reviewed: 01/10/2014 °Elsevier Interactive Patient Education ©2016 [employer] ° °

## 2015-09-04 ENCOUNTER — Other Ambulatory Visit: Payer: Self-pay

## 2015-09-04 LAB — LIPID PANEL
Cholesterol: 225 mg/dL — ABNORMAL HIGH (ref 0–200)
HDL: 41.1 mg/dL (ref >39.00)
NonHDL: 183.98
Total CHOL/HDL Ratio: 5
Triglycerides: 268 mg/dL — ABNORMAL HIGH (ref 0.0–149.0)
VLDL: 53.6 mg/dL — ABNORMAL HIGH (ref 0.0–40.0)

## 2015-09-04 LAB — COMPREHENSIVE METABOLIC PANEL
ALT: 46 U/L (ref 0–53)
AST: 33 U/L (ref 0–37)
Albumin: 4.4 g/dL (ref 3.5–5.2)
Alkaline Phosphatase: 58 U/L (ref 39–117)
BUN: 20 mg/dL (ref 6–23)
CO2: 28 mEq/L (ref 19–32)
Calcium: 9.3 mg/dL (ref 8.4–10.5)
Chloride: 105 mEq/L (ref 96–112)
Creatinine, Ser: 1.02 mg/dL (ref 0.40–1.50)
GFR: 80.75 mL/min (ref >60.00)
Glucose, Bld: 138 mg/dL — ABNORMAL HIGH (ref 70–99)
Potassium: 4 mEq/L (ref 3.5–5.1)
Sodium: 140 mEq/L (ref 135–145)
Total Bilirubin: 0.4 mg/dL (ref 0.2–1.2)
Total Protein: 7.4 g/dL (ref 6.0–8.3)

## 2015-09-04 LAB — CBC WITH DIFFERENTIAL/PLATELET
Basophils Absolute: 0 10*3/uL (ref 0.0–0.1)
Basophils Relative: 0.6 % (ref 0.0–3.0)
Eosinophils Absolute: 0.2 10*3/uL (ref 0.0–0.7)
Eosinophils Relative: 2.2 % (ref 0.0–5.0)
HCT: 40.8 % (ref 39.0–52.0)
Hemoglobin: 13.6 g/dL (ref 13.0–17.0)
Lymphocytes Relative: 27.3 % (ref 12.0–46.0)
Lymphs Abs: 2.4 10*3/uL (ref 0.7–4.0)
MCHC: 33.4 g/dL (ref 30.0–36.0)
MCV: 85.9 fl (ref 78.0–100.0)
Monocytes Absolute: 0.6 10*3/uL (ref 0.1–1.0)
Monocytes Relative: 7.4 % (ref 3.0–12.0)
Neutro Abs: 5.4 10*3/uL (ref 1.4–7.7)
Neutrophils Relative %: 62.5 % (ref 43.0–77.0)
Platelets: 227 10*3/uL (ref 150.0–400.0)
RBC: 4.75 Mil/uL (ref 4.22–5.81)
RDW: 14.1 % (ref 11.5–15.5)
WBC: 8.6 10*3/uL (ref 4.0–10.5)

## 2015-09-04 LAB — LDL CHOLESTEROL, DIRECT: Direct LDL: 161 mg/dL

## 2015-09-04 MED ORDER — AZITHROMYCIN 250 MG PO TABS: ORAL_TABLET | ORAL | Status: DC

## 2015-09-14 ENCOUNTER — Other Ambulatory Visit: Payer: Self-pay | Admitting: Family Medicine

## 2015-09-14 DIAGNOSIS — I10 Essential (primary) hypertension: Secondary | ICD-10-CM

## 2015-09-14 DIAGNOSIS — E785 Hyperlipidemia, unspecified: Secondary | ICD-10-CM

## 2015-09-21 ENCOUNTER — Telehealth: Payer: Self-pay | Admitting: Cardiology

## 2015-09-21 NOTE — Telephone Encounter (Signed)
Pt need to reschedule his appt for Wednesday-09-23-15 please.

## 2015-09-22 NOTE — Progress Notes (Signed)
     HPI: 55 year old male for evaluation of chest pain. Chest x-ray 09/03/2015 showed cardiac enlargement. Laboratories March 2017 showed normal hemoglobin, glucose 138, normal liver functions, normal troponin and LDL 161.  Current Outpatient Prescriptions  Medication Sig Dispense Refill  . azithromycin (ZITHROMAX) 250 MG tablet Take as directed 6 tablet 0  . lisinopril-hydrochlorothiazide (PRINZIDE,ZESTORETIC) 10-12.5 MG tablet Take 1 tablet by mouth daily. 90 tablet 3   No current facility-administered medications for this visit.    Allergies  Allergen Reactions  . Asa [Aspirin] Anaphylaxis and Swelling    Lips swelling    Past Medical History  Diagnosis Date  . Hyperlipemia   . Hypertension   . Kidney stones 2013  . Chickenpox     Past Surgical History  Procedure Laterality Date  . No past surgeries      Social History   Social History  . Marital Status: Married    Spouse Name: N/A  . Number of Children: N/A  . Years of Education: N/A   Occupational History  . Not on file.   Social History Main Topics  . Smoking status: Former Smoker -- 2.00 packs/day for 35 years    Types: Cigarettes    Quit date: 12/21/2013  . Smokeless tobacco: Never Used  . Alcohol Use: 0.0 oz/week    0 Standard drinks or equivalent per week     Comment: Occasionall  . Drug Use: No  . Sexual Activity:    Partners: Female   Other Topics Concern  . Not on file   Social History Narrative    Family History  Problem Relation Age of Onset  . Arthritis Mother   . Arthritis Father   . Hyperlipidemia Father   . Diabetes Brother     ROS: no fevers or chills, productive cough, hemoptysis, dysphasia, odynophagia, melena, hematochezia, dysuria, hematuria, rash, seizure activity, orthopnea, PND, pedal edema, claudication. Remaining systems are negative.  Physical Exam:   There were no vitals taken for this visit.  General:  Well developed/well nourished in NAD Skin  warm/dry Patient not depressed No peripheral clubbing Back-normal HEENT-normal/normal eyelids Neck supple/normal carotid upstroke bilaterally; no bruits; no JVD; no thyromegaly chest - CTA/ normal expansion CV - RRR/normal S1 and S2; no murmurs, rubs or gallops;  PMI nondisplaced Abdomen -NT/ND, no HSM, no mass, + bowel sounds, no bruit 2+ femoral pulses, no bruits Ext-no edema, chords, 2+ DP Neuro-grossly nonfocal  ECG 09/03/2015-sinus rhythm with lateral T-wave inversion.   This encounter was created in error - please disregard.

## 2015-09-23 ENCOUNTER — Encounter: Payer: BLUE CROSS/BLUE SHIELD | Admitting: Cardiology

## 2015-10-08 ENCOUNTER — Other Ambulatory Visit (HOSPITAL_COMMUNITY): Payer: BLUE CROSS/BLUE SHIELD

## 2015-10-14 ENCOUNTER — Ambulatory Visit: Payer: BLUE CROSS/BLUE SHIELD | Admitting: Cardiology

## 2015-10-20 ENCOUNTER — Ambulatory Visit (INDEPENDENT_AMBULATORY_CARE_PROVIDER_SITE_OTHER): Payer: BLUE CROSS/BLUE SHIELD | Admitting: Family Medicine

## 2015-10-20 ENCOUNTER — Encounter: Payer: Self-pay | Admitting: Family Medicine

## 2015-10-20 VITALS — BP 142/80 | HR 83 | Temp 99.0°F | Wt 264.4 lb

## 2015-10-20 DIAGNOSIS — I10 Essential (primary) hypertension: Secondary | ICD-10-CM | POA: Diagnosis not present

## 2015-10-20 DIAGNOSIS — E785 Hyperlipidemia, unspecified: Secondary | ICD-10-CM

## 2015-10-20 DIAGNOSIS — J9801 Acute bronchospasm: Secondary | ICD-10-CM | POA: Diagnosis not present

## 2015-10-20 HISTORY — DX: Hyperlipidemia, unspecified: E78.5

## 2015-10-20 HISTORY — DX: Essential (primary) hypertension: I10

## 2015-10-20 MED ORDER — LISINOPRIL-HYDROCHLOROTHIAZIDE 10-12.5 MG PO TABS: 1.0000 | ORAL_TABLET | Freq: Every day | ORAL | Status: DC

## 2015-10-20 MED ORDER — MOMETASONE FURO-FORMOTEROL FUM 100-5 MCG/ACT IN AERO: INHALATION_SPRAY | RESPIRATORY_TRACT | Status: DC

## 2015-10-20 NOTE — Progress Notes (Signed)
Patient ID: Dennis Hopkins, male    DOB: July 20, 1960  Age: 55 y.o. MRN: [id number]    Subjective:  Subjective HPI Dennis Hopkins presents for f/u bronchitis and chest pain.  He is better but still gets sob with exertion.  His cardio appointment is pending.   He was in the ER at [facility] with back pain and CT was done--- pt states they told him there was something in base of lungs.  We will get those records.    Review of Systems  Constitutional: Negative for diaphoresis, appetite change, fatigue and unexpected weight change.  Eyes: Negative for pain, redness and visual disturbance.  Respiratory: Positive for shortness of breath. Negative for cough, chest tightness and wheezing.   Cardiovascular: Negative for chest pain, palpitations and leg swelling.  Endocrine: Negative for cold intolerance, heat intolerance, polydipsia, polyphagia and polyuria.  Genitourinary: Negative for dysuria, frequency and difficulty urinating.  Neurological: Negative for dizziness, light-headedness, numbness and headaches.    History Past Medical History  Diagnosis Date  . Hyperlipemia   . Hypertension   . Kidney stones 2013  . Chickenpox     He has past surgical history that includes No past surgeries.   His family history includes Arthritis in his father and mother; Diabetes in his brother; Hyperlipidemia in his father.He reports that he quit smoking about 21 months ago. His smoking use included Cigarettes. He has a 70 pack-year smoking history. He has never used smokeless tobacco. He reports that he drinks alcohol. He reports that he does not use illicit drugs.  No current outpatient prescriptions on file prior to visit.   No current facility-administered medications on file prior to visit.     Objective:  Objective Physical Exam  Constitutional: He is oriented to person, place, and time. Vital signs are normal. He appears well-developed and well-nourished. He is sleeping.  HENT:  Head:  Normocephalic and atraumatic.  Mouth/Throat: Oropharynx is clear and moist.  Eyes: EOM are normal. Pupils are equal, round, and reactive to light.  Neck: Normal range of motion. Neck supple. No thyromegaly present.  Cardiovascular: Normal rate and regular rhythm.   No murmur heard. Pulmonary/Chest: Effort normal and breath sounds normal. No respiratory distress. He has no wheezes. He has no rales. He exhibits no tenderness.  Musculoskeletal: He exhibits no edema or tenderness.  Neurological: He is alert and oriented to person, place, and time.  Skin: Skin is warm and dry.  Psychiatric: He has a normal mood and affect. His behavior is normal. Judgment and thought content normal.  Nursing note and vitals reviewed.  BP 142/80 mmHg  Pulse 83  Temp(Src) 99 F (37.2 C) (Oral)  Wt 264 lb 6.4 oz (119.931 kg)  SpO2 97% Wt Readings from Last 3 Encounters:  10/20/15 264 lb 6.4 oz (119.931 kg)  09/03/15 274 lb (124.286 kg)  09/29/14 258 lb (117.028 kg)     Lab Results  Component Value Date   WBC 8.6 09/03/2015   HGB 13.6 09/03/2015   HCT 40.8 09/03/2015   PLT 227.0 09/03/2015   GLUCOSE 138* 09/03/2015   CHOL 225* 09/03/2015   TRIG 268.0* 09/03/2015   HDL 41.10 09/03/2015   LDLDIRECT 161.0 09/03/2015   LDLCALC 144* 09/29/2014   ALT 46 09/03/2015   AST 33 09/03/2015   NA 140 09/03/2015   K 4.0 09/03/2015   CL 105 09/03/2015   CREATININE 1.02 09/03/2015   BUN 20 09/03/2015   CO2 28 09/03/2015   TSH 1.54  09/29/2014   MICROALBUR 1.1 09/29/2014    Dg Chest 2 View  09/03/2015  CLINICAL DATA:  Mid to LEFT side chest pain for few months, hypertension, hyperlipidemia, former smoker EXAM: CHEST  2 VIEW COMPARISON:  None FINDINGS: Mild enlargement of cardiac silhouette. Mediastinal contours and pulmonary vascularity normal. Mild bronchitic changes. No pulmonary infiltrate, pleural effusion, or pneumothorax. Bones unremarkable. IMPRESSION: Enlargement of cardiac silhouette with mild  bronchitic changes. Electronically Signed   By: Ulyses SouthwardMark  Boles M.D.   On: 09/03/2015 16:39     Assessment & Plan:  Plan I have discontinued Mr. Simkins's azithromycin. I am also having him start on mometasone-formoterol. Additionally, I am having him maintain his lisinopril-hydrochlorothiazide.  Meds ordered this encounter  Medications  . mometasone-formoterol (DULERA) 100-5 MCG/ACT AERO    Sig: 2 puffs bid    Dispense:  1 Inhaler    Refill:  2  . lisinopril-hydrochlorothiazide (PRINZIDE,ZESTORETIC) 10-12.5 MG tablet    Sig: Take 1 tablet by mouth daily.    Dispense:  90 tablet    Refill:  3    Problem List Items Addressed This Visit      Unprioritized   HTN (hypertension)   Relevant Medications   lisinopril-hydrochlorothiazide (PRINZIDE,ZESTORETIC) 10-12.5 MG tablet   Hyperlipidemia - Primary    Pt refused meds -- he said "he didn't like the side effects"-- he read about them He understands risk of MI and stroke but will not take a statin We discussed trying diet and exercise for 3 months and recheck      Relevant Medications   lisinopril-hydrochlorothiazide (PRINZIDE,ZESTORETIC) 10-12.5 MG tablet    Other Visit Diagnoses    Bronchospasm        Relevant Medications    mometasone-formoterol (DULERA) 100-5 MCG/ACT AERO       Follow-up: Return in about 3 months (around 01/20/2016) for hyperlipidemia.  Donato SchultzYvonne R Lowne Chase, DO

## 2015-10-20 NOTE — Progress Notes (Signed)
Pre visit review using our clinic review tool, if applicable. No additional management support is needed unless otherwise documented below in the visit note. 

## 2015-10-20 NOTE — Assessment & Plan Note (Signed)
Pt refused meds -- he said "he didn't like the side effects"-- he read about them He understands risk of MI and stroke but will not take a statin We discussed trying diet and exercise for 3 months and recheck

## 2015-10-20 NOTE — Patient Instructions (Signed)
Hypertension Hypertension, commonly called high blood pressure, is when the force of blood pumping through your arteries is too strong. Your arteries are the blood vessels that carry blood from your heart throughout your body. A blood pressure reading consists of a higher number over a lower number, such as 110/72. The higher number (systolic) is the pressure inside your arteries when your heart pumps. The lower number (diastolic) is the pressure inside your arteries when your heart relaxes. Ideally you want your blood pressure below 120/80. Hypertension forces your heart to work harder to pump blood. Your arteries may become narrow or stiff. Having untreated or uncontrolled hypertension can cause heart attack, stroke, kidney disease, and other problems. RISK FACTORS Some risk factors for high blood pressure are controllable. Others are not.  Risk factors you cannot control include:   Race. You may be at higher risk if you are African American.  Age. Risk increases with age.  Gender. Men are at higher risk than women before age 45 years. After age 65, women are at higher risk than men. Risk factors you can control include:  Not getting enough exercise or physical activity.  Being overweight.  Getting too much fat, sugar, calories, or salt in your diet.  Drinking too much alcohol. SIGNS AND SYMPTOMS Hypertension does not usually cause signs or symptoms. Extremely high blood pressure (hypertensive crisis) may cause headache, anxiety, shortness of breath, and nosebleed. DIAGNOSIS To check if you have hypertension, your health care provider will measure your blood pressure while you are seated, with your arm held at the level of your heart. It should be measured at least twice using the same arm. Certain conditions can cause a difference in blood pressure between your right and left arms. A blood pressure reading that is higher than normal on one occasion does not mean that you need treatment. If  it is not clear whether you have high blood pressure, you may be asked to return on a different day to have your blood pressure checked again. Or, you may be asked to monitor your blood pressure at home for 1 or more weeks. TREATMENT Treating high blood pressure includes making lifestyle changes and possibly taking medicine. Living a healthy lifestyle can help lower high blood pressure. You may need to change some of your habits. Lifestyle changes may include:  Following the DASH diet. This diet is high in fruits, vegetables, and whole grains. It is low in salt, red meat, and added sugars.  Keep your sodium intake below 2,300 mg per day.  Getting at least 30-45 minutes of aerobic exercise at least 4 times per week.  Losing weight if necessary.  Not smoking.  Limiting alcoholic beverages.  Learning ways to reduce stress. Your health care provider may prescribe medicine if lifestyle changes are not enough to get your blood pressure under control, and if one of the following is true:  You are 18-59 years of age and your systolic blood pressure is above 140.  You are 60 years of age or older, and your systolic blood pressure is above 150.  Your diastolic blood pressure is above 90.  You have diabetes, and your systolic blood pressure is over 140 or your diastolic blood pressure is over 90.  You have kidney disease and your blood pressure is above 140/90.  You have heart disease and your blood pressure is above 140/90. Your personal target blood pressure may vary depending on your medical conditions, your age, and other factors. HOME CARE INSTRUCTIONS    Have your blood pressure rechecked as directed by your health care provider.   Take medicines only as directed by your health care provider. Follow the directions carefully. Blood pressure medicines must be taken as prescribed. The medicine does not work as well when you skip doses. Skipping doses also puts you at risk for  problems.  Do not smoke.   Monitor your blood pressure at home as directed by your health care provider. SEEK MEDICAL CARE IF:   You think you are having a reaction to medicines taken.  You have recurrent headaches or feel dizzy.  You have swelling in your ankles.  You have trouble with your vision. SEEK IMMEDIATE MEDICAL CARE IF:  You develop a severe headache or confusion.  You have unusual weakness, numbness, or feel faint.  You have severe chest or abdominal pain.  You vomit repeatedly.  You have trouble breathing. MAKE SURE YOU:   Understand these instructions.  Will watch your condition.  Will get help right away if you are not doing well or get worse.   This information is not intended to replace advice given to you by your health care provider. Make sure you discuss any questions you have with your health care provider.   Document Released: 06/06/2005 Document Revised: 10/21/2014 Document Reviewed: 03/29/2013 Elsevier Interactive Patient Education 2016 Elsevier Inc.  

## 2015-10-21 ENCOUNTER — Ambulatory Visit: Payer: BLUE CROSS/BLUE SHIELD | Admitting: Family Medicine

## 2015-10-23 ENCOUNTER — Other Ambulatory Visit: Payer: Self-pay

## 2015-10-23 ENCOUNTER — Telehealth: Payer: Self-pay

## 2015-10-23 DIAGNOSIS — J8410 Pulmonary fibrosis, unspecified: Secondary | ICD-10-CM

## 2015-10-23 NOTE — Telephone Encounter (Signed)
Discussed with patient and he agreed to complete the CT of the Chest due to a calcified granuloma in the right lung.  Order in.   [place]

## 2015-10-28 ENCOUNTER — Ambulatory Visit: Payer: BLUE CROSS/BLUE SHIELD | Admitting: Cardiology

## 2015-10-28 ENCOUNTER — Telehealth (HOSPITAL_COMMUNITY): Payer: Self-pay | Admitting: *Deleted

## 2015-10-28 NOTE — Telephone Encounter (Signed)
Patient given detailed instructions per Stress Test Requisition Sheet for test on 11/05/15 at 2:30. Patient Notified to arrive 30 minutes early, and that it is imperative to arrive on time for appointment to keep from having the test rescheduled.  Patient verbalized understanding. Dennis DolinSharon S Brooks

## 2015-11-05 ENCOUNTER — Other Ambulatory Visit (HOSPITAL_COMMUNITY): Payer: BLUE CROSS/BLUE SHIELD

## 2015-11-05 ENCOUNTER — Ambulatory Visit (HOSPITAL_COMMUNITY): Payer: BLUE CROSS/BLUE SHIELD

## 2015-11-11 ENCOUNTER — Ambulatory Visit (INDEPENDENT_AMBULATORY_CARE_PROVIDER_SITE_OTHER): Payer: BLUE CROSS/BLUE SHIELD | Admitting: Cardiology

## 2015-11-11 ENCOUNTER — Encounter: Payer: Self-pay | Admitting: Cardiology

## 2015-11-11 VITALS — BP 123/79 | HR 61 | Ht 71.0 in | Wt 266.0 lb

## 2015-11-11 DIAGNOSIS — I10 Essential (primary) hypertension: Secondary | ICD-10-CM

## 2015-11-11 DIAGNOSIS — R072 Precordial pain: Secondary | ICD-10-CM | POA: Diagnosis not present

## 2015-11-11 DIAGNOSIS — R079 Chest pain, unspecified: Secondary | ICD-10-CM | POA: Insufficient documentation

## 2015-11-11 DIAGNOSIS — E785 Hyperlipidemia, unspecified: Secondary | ICD-10-CM | POA: Diagnosis not present

## 2015-11-11 HISTORY — DX: Chest pain, unspecified: R07.9

## 2015-11-11 MED ORDER — CLOPIDOGREL BISULFATE 75 MG PO TABS: 75.0000 mg | ORAL_TABLET | Freq: Every day | ORAL | Status: DC

## 2015-11-11 MED ORDER — METOPROLOL SUCCINATE ER 25 MG PO TB24: 12.5000 mg | ORAL_TABLET | Freq: Every day | ORAL | Status: DC

## 2015-11-11 NOTE — Progress Notes (Signed)
   Referring: Dr Laury AxonLowne  HPI: 55 yo male for evaluation of chest pain. Patient states that for the past 2-3 months he has had dyspnea on exertion. No orthopnea, PND, pedal edema or syncope. He also notices chest discomfort. It is substernal and described as a burning sensation. It occurs with more vigorous activities and is relieved with rest. There is associated dyspnea but no nausea or diaphoresis. He had an episode of chest pain last evening at rest for approximately 15 minutes. Because of the above we were asked to evaluate.  Current Outpatient Prescriptions  Medication Sig Dispense Refill  . lisinopril-hydrochlorothiazide (PRINZIDE,ZESTORETIC) 10-12.5 MG tablet Take 1 tablet by mouth daily. 90 tablet 3  . mometasone-formoterol (DULERA) 100-5 MCG/ACT AERO 2 puffs bid 1 Inhaler 2   No current facility-administered medications for this visit.    Allergies  Allergen Reactions  . Asa [Aspirin] Anaphylaxis and Swelling    Lips swelling     Past Medical History  Diagnosis Date  . Hyperlipemia   . Hypertension   . Kidney stones 2013  . Chickenpox     Past Surgical History  Procedure Laterality Date  . Tonsillectomy      Social History   Social History  . Marital Status: Married    Spouse Name: N/A  . Number of Children: 1  . Years of Education: N/A   Occupational History  . Not on file.   Social History Main Topics  . Smoking status: Former Smoker -- 2.00 packs/day for 35 years    Types: Cigarettes    Quit date: 12/21/2013  . Smokeless tobacco: Never Used  . Alcohol Use: 0.0 oz/week    0 Standard drinks or equivalent per week     Comment: 1-2 drinks per night  . Drug Use: No  . Sexual Activity:    Partners: Female   Other Topics Concern  . Not on file   Social History Narrative    Family History  Problem Relation Age of Onset  . Arthritis Mother   . Arthritis Father   . Hyperlipidemia Father   . Diabetes Brother     ROS: no fevers or chills,  productive cough, hemoptysis, dysphasia, odynophagia, melena, hematochezia, dysuria, hematuria, rash, seizure activity, orthopnea, PND, pedal edema, claudication. Remaining systems are negative.  Physical Exam:   Blood pressure 123/79, pulse 61, height 5\' 11"  (1.803 m), weight 266 lb (120.657 kg), SpO2 98 %.  General:  Well developed/obese in NAD Skin warm/dry Patient not depressed No peripheral clubbing Back-normal HEENT-normal/normal eyelids Neck supple/normal carotid upstroke bilaterally; no bruits; no JVD; no thyromegaly chest - CTA/ normal expansion CV - RRR/normal S1 and S2; no murmurs, rubs or gallops;  PMI nondisplaced Abdomen -NT/ND, no HSM, no mass, + bowel sounds, no bruit 2+ femoral pulses, no bruits Ext-no edema, chords, 2+ DP Neuro-grossly nonfocal  ECG 09/03/2015-sinus rhythm with lateral T-wave inversion.  Sinus rhythm at a rate of 61. Normal axis. Nonspecific inferior lateral T-wave changes.

## 2015-11-11 NOTE — Assessment & Plan Note (Signed)
Management per primary care. 

## 2015-11-11 NOTE — Assessment & Plan Note (Signed)
Blood pressure controlled. Continue present medications. 

## 2015-11-11 NOTE — Patient Instructions (Signed)
Medication Instructions:   START METOPROLOL SUCC ER 12.5 MG ONCE DAILY= 1/2 OF 25 MG TABLET ONCE DAILY  START CLOPIDOGREL 75 MG ONCE DAILY   Testing/Procedures:  Your physician has requested that you have a cardiac catheterization. Cardiac catheterization is used to diagnose and/or treat various heart conditions. Doctors may recommend this procedure for a number of different reasons. The most common reason is to evaluate chest pain. Chest pain can be a symptom of coronary artery disease (CAD), and cardiac catheterization can show whether plaque is narrowing or blocking your heart's arteries. This procedure is also used to evaluate the valves, as well as measure the blood flow and oxygen levels in different parts of your heart. For further information please visit [email or web address]. Please follow instruction sheet, as given.

## 2015-11-11 NOTE — Assessment & Plan Note (Signed)
Patient presents with symptoms that are concerning for angina. He had 15 minutes of chest pain at rest last evening. I therefore have recommended admission to telemetry, rule out myocardial infarction and proceeding with cardiac catheterization tomorrow morning. However he states he cannot leave his job and he also is scheduled for vacation next week. He refused admission. I explained the risks of undiagnosed coronary disease including myocardial infarction and death. He continues to refuse and understands the risk. He will agree to cardiac catheterization after he returns from his vacation. He will contact us when he is agreeable to proceed. He has an allergy to aspirin which includes facial swelling. I will add Plavix 75 mg daily. I will also add Toprol 12.5 mg daily. Proceed with catheterization when patient agreeable.

## 2015-12-16 ENCOUNTER — Other Ambulatory Visit (INDEPENDENT_AMBULATORY_CARE_PROVIDER_SITE_OTHER): Payer: BLUE CROSS/BLUE SHIELD

## 2015-12-16 DIAGNOSIS — E785 Hyperlipidemia, unspecified: Secondary | ICD-10-CM | POA: Diagnosis not present

## 2015-12-16 DIAGNOSIS — I10 Essential (primary) hypertension: Secondary | ICD-10-CM

## 2015-12-16 LAB — LIPID PANEL
Cholesterol: 220 mg/dL — ABNORMAL HIGH (ref 0–200)
HDL: 39.9 mg/dL (ref >39.00)
LDL Cholesterol: 146 mg/dL — ABNORMAL HIGH (ref 0–99)
NonHDL: 179.61
Total CHOL/HDL Ratio: 6
Triglycerides: 169 mg/dL — ABNORMAL HIGH (ref 0.0–149.0)
VLDL: 33.8 mg/dL (ref 0.0–40.0)

## 2015-12-16 LAB — COMPREHENSIVE METABOLIC PANEL
ALT: 37 U/L (ref 0–53)
AST: 20 U/L (ref 0–37)
Albumin: 4.2 g/dL (ref 3.5–5.2)
Alkaline Phosphatase: 49 U/L (ref 39–117)
BUN: 19 mg/dL (ref 6–23)
CO2: 28 mEq/L (ref 19–32)
Calcium: 9.3 mg/dL (ref 8.4–10.5)
Chloride: 104 mEq/L (ref 96–112)
Creatinine, Ser: 0.92 mg/dL (ref 0.40–1.50)
GFR: 90.87 mL/min (ref >60.00)
Glucose, Bld: 142 mg/dL — ABNORMAL HIGH (ref 70–99)
Potassium: 4 mEq/L (ref 3.5–5.1)
Sodium: 140 mEq/L (ref 135–145)
Total Bilirubin: 0.4 mg/dL (ref 0.2–1.2)
Total Protein: 7.3 g/dL (ref 6.0–8.3)

## 2015-12-18 ENCOUNTER — Ambulatory Visit (HOSPITAL_BASED_OUTPATIENT_CLINIC_OR_DEPARTMENT_OTHER)
Admission: RE | Admit: 2015-12-18 | Discharge: 2015-12-18 | Disposition: A | Discharge status: Home / Self Care | Payer: BLUE CROSS/BLUE SHIELD | Source: Ambulatory Visit | Attending: Family Medicine | Admitting: Family Medicine

## 2015-12-18 ENCOUNTER — Encounter (INDEPENDENT_AMBULATORY_CARE_PROVIDER_SITE_OTHER): Payer: Self-pay

## 2015-12-18 DIAGNOSIS — I77810 Thoracic aortic ectasia: Secondary | ICD-10-CM | POA: Diagnosis not present

## 2015-12-18 DIAGNOSIS — I2510 Atherosclerotic heart disease of native coronary artery without angina pectoris: Secondary | ICD-10-CM | POA: Insufficient documentation

## 2015-12-18 DIAGNOSIS — J984 Other disorders of lung: Secondary | ICD-10-CM

## 2015-12-18 DIAGNOSIS — J8410 Pulmonary fibrosis, unspecified: Secondary | ICD-10-CM | POA: Diagnosis present

## 2015-12-18 DIAGNOSIS — I700 Atherosclerosis of aorta: Secondary | ICD-10-CM | POA: Insufficient documentation

## 2015-12-23 ENCOUNTER — Other Ambulatory Visit: Payer: Self-pay

## 2015-12-23 DIAGNOSIS — R7309 Other abnormal glucose: Secondary | ICD-10-CM

## 2016-01-04 ENCOUNTER — Telehealth: Payer: Self-pay | Admitting: Cardiology

## 2016-01-04 ENCOUNTER — Ambulatory Visit: Payer: BLUE CROSS/BLUE SHIELD | Admitting: Family Medicine

## 2016-01-04 NOTE — Telephone Encounter (Signed)
Spoke with pt wife, follow up appointment to discuss cath made.

## 2016-01-04 NOTE — Telephone Encounter (Signed)
New Message  Request for surgical clearance:  1. What type of surgery is being performed? cath  2. When is this surgery scheduled? Pt wife states its not scheduled  3. Are there any medications that need to be held prior to surgery and how long?   4. Name of physician performing surgery? Pt wife states she did not see name   5. What is your office phone and fax number?  Pt wife received a letter to schedule surgical clearance. Please call back to discuss

## 2016-01-06 ENCOUNTER — Ambulatory Visit (INDEPENDENT_AMBULATORY_CARE_PROVIDER_SITE_OTHER): Payer: BLUE CROSS/BLUE SHIELD | Admitting: Cardiology

## 2016-01-06 ENCOUNTER — Encounter: Payer: Self-pay | Admitting: Cardiology

## 2016-01-06 ENCOUNTER — Telehealth: Payer: Self-pay | Admitting: Family Medicine

## 2016-01-06 ENCOUNTER — Other Ambulatory Visit: Payer: Self-pay | Admitting: Cardiology

## 2016-01-06 ENCOUNTER — Other Ambulatory Visit (INDEPENDENT_AMBULATORY_CARE_PROVIDER_SITE_OTHER): Payer: BLUE CROSS/BLUE SHIELD

## 2016-01-06 ENCOUNTER — Encounter: Payer: Self-pay | Admitting: *Deleted

## 2016-01-06 VITALS — BP 147/87 | HR 64 | Ht 71.0 in | Wt 271.8 lb

## 2016-01-06 DIAGNOSIS — I208 Other forms of angina pectoris: Secondary | ICD-10-CM | POA: Diagnosis not present

## 2016-01-06 DIAGNOSIS — R072 Precordial pain: Secondary | ICD-10-CM | POA: Diagnosis not present

## 2016-01-06 DIAGNOSIS — I10 Essential (primary) hypertension: Secondary | ICD-10-CM | POA: Diagnosis not present

## 2016-01-06 DIAGNOSIS — E785 Hyperlipidemia, unspecified: Secondary | ICD-10-CM

## 2016-01-06 LAB — BASIC METABOLIC PANEL
BUN: 17 mg/dL (ref 7–25)
CO2: 24 mmol/L (ref 20–31)
Calcium: 9.2 mg/dL (ref 8.6–10.3)
Chloride: 103 mmol/L (ref 98–110)
Creat: 1.13 mg/dL (ref 0.70–1.33)
Glucose, Bld: 189 mg/dL — ABNORMAL HIGH (ref 65–99)
Potassium: 4.1 mmol/L (ref 3.5–5.3)
Sodium: 139 mmol/L (ref 135–146)

## 2016-01-06 LAB — CBC
HCT: 38.6 % (ref 38.5–50.0)
Hemoglobin: 13.2 g/dL (ref 13.2–17.1)
MCH: 29.2 pg (ref 27.0–33.0)
MCHC: 34.2 g/dL (ref 32.0–36.0)
MCV: 85.4 fL (ref 80.0–100.0)
MPV: 9.2 fL (ref 7.5–12.5)
Platelets: 232 10*3/uL (ref 140–400)
RBC: 4.52 MIL/uL (ref 4.20–5.80)
RDW: 14.2 % (ref 11.0–15.0)
WBC: 7.9 10*3/uL (ref 3.8–10.8)

## 2016-01-06 LAB — PROTIME-INR
INR: 1
Prothrombin Time: 10.5 s (ref 9.0–11.5)

## 2016-01-06 MED ORDER — LOSARTAN POTASSIUM 100 MG PO TABS: 100.0000 mg | ORAL_TABLET | Freq: Every day | ORAL | Status: DC

## 2016-01-06 MED ORDER — METOPROLOL SUCCINATE ER 25 MG PO TB24: 25.0000 mg | ORAL_TABLET | Freq: Every day | ORAL | Status: DC

## 2016-01-06 NOTE — Telephone Encounter (Signed)
Patient was No Show on July 17. Charge or No Charge?

## 2016-01-06 NOTE — Patient Instructions (Signed)
Medication Instructions:   STOP LISINOPRIL-HCTZ  START LOSARTAN 100 MG ONCE DAILY  INCREASE METOPROLOL TO 25 MG ONCE DAILY= 1 WHOLE TABLET ONCE DAILY  Labwork:  Your physician recommends that you HAVE LAB WORK TODAY-2ND FLOOR  Testing/Procedures:  Your physician has requested that you have a cardiac catheterization. Cardiac catheterization is used to diagnose and/or treat various heart conditions. Doctors may recommend this procedure for a number of different reasons. The most common reason is to evaluate chest pain. Chest pain can be a symptom of coronary artery disease (CAD), and cardiac catheterization can show whether plaque is narrowing or blocking your heart's arteries. This procedure is also used to evaluate the valves, as well as measure the blood flow and oxygen levels in different parts of your heart. For further information please visit [email or web address]. Please follow instruction sheet, as given.    Follow-Up:  Your physician recommends that you schedule a follow-up appointment in: 6 WEEKS WITH DR Inda MerlinFRENSHAW

## 2016-01-06 NOTE — Progress Notes (Signed)
    HPI: FU chest pain; initially seen in May 2017 with complaints of chest pain; admission and cath recommended but pt refused. Plavix (ASA allergy) and toprol added. Chest CT 6/17 showed LM and 3 vessel calcium; aortic root 3.9 cm. Since last seen, Patient continues to complain of dyspnea on exertion which has now been present for 3-4 months. There is no orthopnea or PND and no pedal edema. He has burning chest pain that occurs with activities and is relieved with rest. It has not changed. He does not have rest symptoms. No associated nausea or diaphoresis but there is dyspnea.  Current Outpatient Prescriptions  Medication Sig Dispense Refill  . clopidogrel (PLAVIX) 75 MG tablet Take 1 tablet (75 mg total) by mouth daily. 30 tablet 12  . metoprolol succinate (TOPROL XL) 25 MG 24 hr tablet Take 1 tablet (25 mg total) by mouth daily. 90 tablet 3  . mometasone-formoterol (DULERA) 100-5 MCG/ACT AERO 2 puffs bid 1 Inhaler 2  . losartan (COZAAR) 100 MG tablet Take 1 tablet (100 mg total) by mouth daily. 90 tablet 3   No current facility-administered medications for this visit.     Past Medical History  Diagnosis Date  . Hyperlipemia   . Hypertension   . Kidney stones 2013  . Chickenpox     Past Surgical History  Procedure Laterality Date  . Tonsillectomy      Social History   Social History  . Marital Status: Married    Spouse Name: N/A  . Number of Children: 1  . Years of Education: N/A   Occupational History  . Not on file.   Social History Main Topics  . Smoking status: Former Smoker -- 2.00 packs/day for 35 years    Types: Cigarettes    Quit date: 12/21/2013  . Smokeless tobacco: Never Used  . Alcohol Use: 0.0 oz/week    0 Standard drinks or equivalent per week     Comment: 1-2 drinks per night  . Drug Use: No  . Sexual Activity:    Partners: Female   Other Topics Concern  . Not on file   Social History Narrative    Family History  Problem Relation Age of  Onset  . Arthritis Mother   . Arthritis Father   . Hyperlipidemia Father   . Diabetes Brother     ROS: no fevers or chills, productive cough, hemoptysis, dysphasia, odynophagia, melena, hematochezia, dysuria, hematuria, rash, seizure activity, orthopnea, PND, pedal edema, claudication. Remaining systems are negative.  Physical Exam: Well-developed obese in no acute distress.  Skin is warm and dry.  HEENT is normal.  Neck is supple.  Chest is clear to auscultation with normal expansion.  Cardiovascular exam is regular rate and rhythm.  Abdominal exam nontender or distended. No masses palpated. Extremities show no edema. neuro grossly intact  ECG Sinus rhythm at a rate of 64. Normal axis. Nonspecific ST changes.  A/P  1 Angina-patient continues to have dyspnea on exertion and chest discomfort with exertion relieved with rest. His symptoms are extremely concerning. He is now willing to proceed with cardiac catheterization. We will arrange. The risks and benefits including myocardial infarction, CVA and death were discussed and he agrees to proceed. Continue Plavix. He has an aspirin allergy. Increase metoprolol to 25 mg daily. If coronary artery disease demonstrated he will need a statin. Note his chest CT showed coronary calcification as well.  2 hypertension-blood pressure is elevated. He also notes a cough which may be   secondary to lisinopril. We will discontinue lisinopril HCT and add Cozaar 100 mg daily. Follow blood pressure and adjust regimen as needed. Note we are also increasing metoprolol.  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 Plan repeat study in June 2018.  4 hyperlipidemia-add statin if coronary artery disease demonstrated at time of catheterization.  Olga MillersBrian Breeley Bischof, MD

## 2016-01-06 NOTE — Telephone Encounter (Signed)
charge 

## 2016-01-06 NOTE — Telephone Encounter (Signed)
Caller name: Dianne Relation to pt: Spouse Call back [phone] Pharmacy:  Reason for call: Pt's wife wanted to inform PCP that her husband Dennis Hopkins(Dennis Hopkins) is going into Heart surgery tomorrow, just for FYI. Thank you.

## 2016-01-07 ENCOUNTER — Encounter (HOSPITAL_COMMUNITY)
Admission: AD | Disposition: A | Discharge status: Home / Self Care | Payer: Self-pay | Source: Ambulatory Visit | Attending: Interventional Cardiology

## 2016-01-07 ENCOUNTER — Telehealth: Payer: Self-pay | Admitting: *Deleted

## 2016-01-07 ENCOUNTER — Inpatient Hospital Stay (HOSPITAL_COMMUNITY)
Admission: AD | Admit: 2016-01-07 | Discharge: 2016-01-18 | DRG: 234 | Disposition: A | Discharge status: Home / Self Care | Payer: BLUE CROSS/BLUE SHIELD | Source: Ambulatory Visit | Attending: Thoracic Surgery (Cardiothoracic Vascular Surgery) | Admitting: Thoracic Surgery (Cardiothoracic Vascular Surgery)

## 2016-01-07 ENCOUNTER — Encounter: Payer: Self-pay | Admitting: Family Medicine

## 2016-01-07 DIAGNOSIS — Z833 Family history of diabetes mellitus: Secondary | ICD-10-CM

## 2016-01-07 DIAGNOSIS — I2089 Other forms of angina pectoris: Secondary | ICD-10-CM

## 2016-01-07 DIAGNOSIS — E785 Hyperlipidemia, unspecified: Secondary | ICD-10-CM | POA: Diagnosis present

## 2016-01-07 DIAGNOSIS — I2510 Atherosclerotic heart disease of native coronary artery without angina pectoris: Secondary | ICD-10-CM | POA: Diagnosis present

## 2016-01-07 DIAGNOSIS — I25118 Atherosclerotic heart disease of native coronary artery with other forms of angina pectoris: Principal | ICD-10-CM

## 2016-01-07 DIAGNOSIS — R079 Chest pain, unspecified: Secondary | ICD-10-CM

## 2016-01-07 DIAGNOSIS — Z951 Presence of aortocoronary bypass graft: Secondary | ICD-10-CM

## 2016-01-07 DIAGNOSIS — Z886 Allergy status to analgesic agent status: Secondary | ICD-10-CM

## 2016-01-07 DIAGNOSIS — Z79899 Other long term (current) drug therapy: Secondary | ICD-10-CM

## 2016-01-07 DIAGNOSIS — I10 Essential (primary) hypertension: Secondary | ICD-10-CM | POA: Diagnosis present

## 2016-01-07 DIAGNOSIS — I208 Other forms of angina pectoris: Secondary | ICD-10-CM | POA: Diagnosis present

## 2016-01-07 DIAGNOSIS — I77810 Thoracic aortic ectasia: Secondary | ICD-10-CM | POA: Diagnosis present

## 2016-01-07 DIAGNOSIS — Z6839 Body mass index (BMI) 39.0-39.9, adult: Secondary | ICD-10-CM

## 2016-01-07 DIAGNOSIS — D62 Acute posthemorrhagic anemia: Secondary | ICD-10-CM | POA: Diagnosis not present

## 2016-01-07 DIAGNOSIS — R066 Hiccough: Secondary | ICD-10-CM | POA: Diagnosis not present

## 2016-01-07 DIAGNOSIS — Z7902 Long term (current) use of antithrombotics/antiplatelets: Secondary | ICD-10-CM

## 2016-01-07 DIAGNOSIS — J9811 Atelectasis: Secondary | ICD-10-CM

## 2016-01-07 DIAGNOSIS — F17200 Nicotine dependence, unspecified, uncomplicated: Secondary | ICD-10-CM | POA: Diagnosis present

## 2016-01-07 DIAGNOSIS — E669 Obesity, unspecified: Secondary | ICD-10-CM | POA: Diagnosis present

## 2016-01-07 DIAGNOSIS — Z87891 Personal history of nicotine dependence: Secondary | ICD-10-CM

## 2016-01-07 DIAGNOSIS — E119 Type 2 diabetes mellitus without complications: Secondary | ICD-10-CM

## 2016-01-07 HISTORY — PX: CARDIAC CATHETERIZATION: SHX172

## 2016-01-07 LAB — MRSA PCR SCREENING: MRSA by PCR: NEGATIVE

## 2016-01-07 SURGERY — LEFT HEART CATH AND CORONARY ANGIOGRAPHY
Anesthesia: LOCAL

## 2016-01-07 MED ORDER — MIDAZOLAM HCL 2 MG/2ML IJ SOLN
INTRAMUSCULAR | Status: AC
  Filled 2016-01-07: qty 2

## 2016-01-07 MED ORDER — SODIUM CHLORIDE 0.9 % WEIGHT BASED INFUSION
3.0000 mL/kg/h | INTRAVENOUS | Status: DC
  Administered 2016-01-07: 3 mL/kg/h via INTRAVENOUS

## 2016-01-07 MED ORDER — SODIUM CHLORIDE 0.9% FLUSH: 3.0000 mL | Freq: Two times a day (BID) | INTRAVENOUS | Status: DC

## 2016-01-07 MED ORDER — IOPAMIDOL (ISOVUE-370) INJECTION 76%
INTRAVENOUS | Status: AC
  Filled 2016-01-07: qty 100

## 2016-01-07 MED ORDER — HEPARIN (PORCINE) IN NACL 2-0.9 UNIT/ML-% IJ SOLN
INTRAMUSCULAR | Status: DC | PRN
  Administered 2016-01-07: 1000 mL

## 2016-01-07 MED ORDER — MOMETASONE FURO-FORMOTEROL FUM 100-5 MCG/ACT IN AERO
2.0000 | INHALATION_SPRAY | Freq: Two times a day (BID) | RESPIRATORY_TRACT | Status: DC
  Administered 2016-01-07 – 2016-01-12 (×11): 2 via RESPIRATORY_TRACT
  Filled 2016-01-07: qty 8.8

## 2016-01-07 MED ORDER — HEPARIN SODIUM (PORCINE) 1000 UNIT/ML IJ SOLN
INTRAMUSCULAR | Status: DC | PRN
  Administered 2016-01-07: 6000 [IU] via INTRAVENOUS

## 2016-01-07 MED ORDER — CLOPIDOGREL BISULFATE 75 MG PO TABS: 75.0000 mg | ORAL_TABLET | ORAL | Status: DC

## 2016-01-07 MED ORDER — FENTANYL CITRATE (PF) 100 MCG/2ML IJ SOLN
INTRAMUSCULAR | Status: DC | PRN
  Administered 2016-01-07: 50 ug via INTRAVENOUS

## 2016-01-07 MED ORDER — VERAPAMIL HCL 2.5 MG/ML IV SOLN
INTRAVENOUS | Status: DC | PRN
  Administered 2016-01-07: 10 mL via INTRA_ARTERIAL

## 2016-01-07 MED ORDER — SODIUM CHLORIDE 0.9 % WEIGHT BASED INFUSION
1.0000 mL/kg/h | INTRAVENOUS | Status: AC
  Administered 2016-01-07: 1 mL/kg/h via INTRAVENOUS

## 2016-01-07 MED ORDER — SODIUM CHLORIDE 0.9 % IV SOLN: 250.0000 mL | INTRAVENOUS | Status: DC | PRN

## 2016-01-07 MED ORDER — HEPARIN (PORCINE) IN NACL 2-0.9 UNIT/ML-% IJ SOLN
INTRAMUSCULAR | Status: AC
  Filled 2016-01-07: qty 1000

## 2016-01-07 MED ORDER — LIDOCAINE HCL (PF) 1 % IJ SOLN
INTRAMUSCULAR | Status: AC
  Filled 2016-01-07: qty 30

## 2016-01-07 MED ORDER — SODIUM CHLORIDE 0.9 % WEIGHT BASED INFUSION: 1.0000 mL/kg/h | INTRAVENOUS | Status: DC

## 2016-01-07 MED ORDER — HEPARIN SODIUM (PORCINE) 1000 UNIT/ML IJ SOLN
INTRAMUSCULAR | Status: AC
  Filled 2016-01-07: qty 1

## 2016-01-07 MED ORDER — LOSARTAN POTASSIUM 50 MG PO TABS
100.0000 mg | ORAL_TABLET | Freq: Every day | ORAL | Status: DC
  Administered 2016-01-08 – 2016-01-12 (×5): 100 mg via ORAL
  Filled 2016-01-07 (×5): qty 2

## 2016-01-07 MED ORDER — METOPROLOL SUCCINATE ER 25 MG PO TB24
25.0000 mg | ORAL_TABLET | Freq: Every day | ORAL | Status: DC
  Administered 2016-01-08 – 2016-01-12 (×5): 25 mg via ORAL
  Filled 2016-01-07 (×5): qty 1

## 2016-01-07 MED ORDER — MIDAZOLAM HCL 2 MG/2ML IJ SOLN
INTRAMUSCULAR | Status: DC | PRN
  Administered 2016-01-07: 2 mg via INTRAVENOUS

## 2016-01-07 MED ORDER — FENTANYL CITRATE (PF) 100 MCG/2ML IJ SOLN
INTRAMUSCULAR | Status: AC
  Filled 2016-01-07: qty 2

## 2016-01-07 MED ORDER — SODIUM CHLORIDE 0.9% FLUSH: 3.0000 mL | INTRAVENOUS | Status: DC | PRN

## 2016-01-07 MED ORDER — SODIUM CHLORIDE 0.9% FLUSH
3.0000 mL | Freq: Two times a day (BID) | INTRAVENOUS | Status: DC
  Administered 2016-01-07 – 2016-01-08 (×2): 3 mL via INTRAVENOUS
  Administered 2016-01-09: 10 mL via INTRAVENOUS
  Administered 2016-01-09 – 2016-01-12 (×5): 3 mL via INTRAVENOUS

## 2016-01-07 MED ORDER — SODIUM CHLORIDE 0.9 % IV SOLN: 250.0000 mL | Freq: Once | INTRAVENOUS | Status: DC

## 2016-01-07 MED ORDER — VERAPAMIL HCL 2.5 MG/ML IV SOLN
INTRAVENOUS | Status: AC
  Filled 2016-01-07: qty 2

## 2016-01-07 MED ORDER — IOPAMIDOL (ISOVUE-370) INJECTION 76%
INTRAVENOUS | Status: AC
  Filled 2016-01-07: qty 50

## 2016-01-07 MED ORDER — ACETAMINOPHEN 325 MG PO TABS: 650.0000 mg | ORAL_TABLET | ORAL | Status: DC | PRN

## 2016-01-07 MED ORDER — LIDOCAINE HCL (PF) 1 % IJ SOLN
INTRAMUSCULAR | Status: DC | PRN
  Administered 2016-01-07: 2 mL

## 2016-01-07 MED ORDER — ONDANSETRON HCL 4 MG/2ML IJ SOLN: 4.0000 mg | Freq: Four times a day (QID) | INTRAMUSCULAR | Status: DC | PRN

## 2016-01-07 SURGICAL SUPPLY — 14 items
CATH INFINITI 5 FR 3DRC (CATHETERS) ×1 IMPLANT
CATH INFINITI 5 FR AL2 (CATHETERS) ×1 IMPLANT
CATH INFINITI 5 FR JL3.5 (CATHETERS) ×2 IMPLANT
CATH INFINITI 5FR ANG PIGTAIL (CATHETERS) ×1 IMPLANT
CATH INFINITI JR4 5F (CATHETERS) ×1 IMPLANT
CATH LAUNCHER 5F EBU3.5 (CATHETERS) ×1 IMPLANT
DEVICE RAD COMP TR BAND LRG (VASCULAR PRODUCTS) ×2 IMPLANT
GLIDESHEATH SLEND SS 6F .021 (SHEATH) ×1 IMPLANT
KIT HEART LEFT (KITS) ×2 IMPLANT
PACK CARDIAC CATHETERIZATION (CUSTOM PROCEDURE TRAY) ×2 IMPLANT
SYR MEDRAD MARK V 150ML (SYRINGE) ×2 IMPLANT
TRANSDUCER W/STOPCOCK (MISCELLANEOUS) ×2 IMPLANT
TUBING CIL FLEX 10 FLL-RA (TUBING) ×2 IMPLANT
WIRE SAFE-T 1.5MM-J .035X260CM (WIRE) ×2 IMPLANT

## 2016-01-07 NOTE — Telephone Encounter (Signed)
FYI

## 2016-01-07 NOTE — Telephone Encounter (Signed)
[facility] CB#: [phone]  Reason for call: Pt is having a left heart cath today and wanted to know if he still needed to schedule stress test and echo (ordered in March), or if the cath replaced the need for echo/stress test. Please advise.

## 2016-01-07 NOTE — Telephone Encounter (Signed)
No need to do them now

## 2016-01-07 NOTE — H&P (View-Only) (Signed)
HPI: FU chest pain; initially seen in May 2017 with complaints of chest pain; admission and cath recommended but pt refused. Plavix (ASA allergy) and toprol added. Chest CT 6/17 showed LM and 3 vessel calcium; aortic root 3.9 cm. Since last seen, Patient continues to complain of dyspnea on exertion which has now been present for 3-4 months. There is no orthopnea or PND and no pedal edema. He has burning chest pain that occurs with activities and is relieved with rest. It has not changed. He does not have rest symptoms. No associated nausea or diaphoresis but there is dyspnea.  Current Outpatient Prescriptions  Medication Sig Dispense Refill  . clopidogrel (PLAVIX) 75 MG tablet Take 1 tablet (75 mg total) by mouth daily. 30 tablet 12  . metoprolol succinate (TOPROL XL) 25 MG 24 hr tablet Take 1 tablet (25 mg total) by mouth daily. 90 tablet 3  . mometasone-formoterol (DULERA) 100-5 MCG/ACT AERO 2 puffs bid 1 Inhaler 2  . losartan (COZAAR) 100 MG tablet Take 1 tablet (100 mg total) by mouth daily. 90 tablet 3   No current facility-administered medications for this visit.     Past Medical History  Diagnosis Date  . Hyperlipemia   . Hypertension   . Kidney stones 2013  . Chickenpox     Past Surgical History  Procedure Laterality Date  . Tonsillectomy      Social History   Social History  . Marital Status: Married    Spouse Name: N/A  . Number of Children: 1  . Years of Education: N/A   Occupational History  . Not on file.   Social History Main Topics  . Smoking status: Former Smoker -- 2.00 packs/day for 35 years    Types: Cigarettes    Quit date: 12/21/2013  . Smokeless tobacco: Never Used  . Alcohol Use: 0.0 oz/week    0 Standard drinks or equivalent per week     Comment: 1-2 drinks per night  . Drug Use: No  . Sexual Activity:    Partners: Female   Other Topics Concern  . Not on file   Social History Narrative    Family History  Problem Relation Age of  Onset  . Arthritis Mother   . Arthritis Father   . Hyperlipidemia Father   . Diabetes Brother     ROS: no fevers or chills, productive cough, hemoptysis, dysphasia, odynophagia, melena, hematochezia, dysuria, hematuria, rash, seizure activity, orthopnea, PND, pedal edema, claudication. Remaining systems are negative.  Physical Exam: Well-developed obese in no acute distress.  Skin is warm and dry.  HEENT is normal.  Neck is supple.  Chest is clear to auscultation with normal expansion.  Cardiovascular exam is regular rate and rhythm.  Abdominal exam nontender or distended. No masses palpated. Extremities show no edema. neuro grossly intact  ECG Sinus rhythm at a rate of 64. Normal axis. Nonspecific ST changes.  A/P  1 Angina-patient continues to have dyspnea on exertion and chest discomfort with exertion relieved with rest. His symptoms are extremely concerning. He is now willing to proceed with cardiac catheterization. We will arrange. The risks and benefits including myocardial infarction, CVA and death were discussed and he agrees to proceed. Continue Plavix. He has an aspirin allergy. Increase metoprolol to 25 mg daily. If coronary artery disease demonstrated he will need a statin. Note his chest CT showed coronary calcification as well.  2 hypertension-blood pressure is elevated. He also notes a cough which may be  secondary to lisinopril. We will discontinue lisinopril HCT and add Cozaar 100 mg daily. Follow blood pressure and adjust regimen as needed. Note we are also increasing metoprolol.  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 Plan repeat study in June 2018.  4 hyperlipidemia-add statin if coronary artery disease demonstrated at time of catheterization.  Olga MillersBrian Leslea Vowles, MD

## 2016-01-07 NOTE — Interval H&P Note (Signed)
Cath Lab Visit (complete for each Cath Lab visit)  Clinical Evaluation Leading to the Procedure:   ACS: No.  Non-ACS:    Anginal Classification: CCS III  Anti-ischemic medical therapy: Minimal Therapy (1 class of medications)  Non-Invasive Test Results: Intermediate-risk stress test findings: cardiac mortality 1-3%/year  Prior CABG: No previous CABG      History and Physical Interval Note:  01/07/2016 5:49 PM  Jonluke Hoy RegisterW Postema  has presented today for surgery, with the diagnosis of [place]  The various methods of treatment have been discussed with the patient and family. After consideration of risks, benefits and other options for treatment, the patient has consented to  Procedure(s): Left Heart Cath and Coronary Angiography (N/A) as a surgical intervention .  The patient's history has been reviewed, patient examined, no change in status, stable for surgery.  I have reviewed the patient's chart and labs.  Questions were answered to the patient's satisfaction.     Lance MussJayadeep Saivion Goettel

## 2016-01-07 NOTE — Telephone Encounter (Signed)
Ok --- he was a no show for an appointment-- please be sure he does not get charged

## 2016-01-07 NOTE — Telephone Encounter (Signed)
[facility] advising of below.

## 2016-01-08 ENCOUNTER — Encounter (HOSPITAL_COMMUNITY): Payer: Self-pay

## 2016-01-08 ENCOUNTER — Ambulatory Visit (HOSPITAL_COMMUNITY): Payer: BLUE CROSS/BLUE SHIELD

## 2016-01-08 ENCOUNTER — Encounter: Payer: Self-pay | Admitting: Family Medicine

## 2016-01-08 ENCOUNTER — Ambulatory Visit (HOSPITAL_BASED_OUTPATIENT_CLINIC_OR_DEPARTMENT_OTHER): Payer: BLUE CROSS/BLUE SHIELD

## 2016-01-08 DIAGNOSIS — E669 Obesity, unspecified: Secondary | ICD-10-CM | POA: Diagnosis not present

## 2016-01-08 DIAGNOSIS — I208 Other forms of angina pectoris: Secondary | ICD-10-CM | POA: Diagnosis not present

## 2016-01-08 DIAGNOSIS — R079 Chest pain, unspecified: Secondary | ICD-10-CM | POA: Diagnosis not present

## 2016-01-08 DIAGNOSIS — I10 Essential (primary) hypertension: Secondary | ICD-10-CM | POA: Diagnosis not present

## 2016-01-08 DIAGNOSIS — E785 Hyperlipidemia, unspecified: Secondary | ICD-10-CM | POA: Diagnosis not present

## 2016-01-08 DIAGNOSIS — I2510 Atherosclerotic heart disease of native coronary artery without angina pectoris: Secondary | ICD-10-CM

## 2016-01-08 LAB — LIPID PANEL
Cholesterol: 224 mg/dL — ABNORMAL HIGH (ref 0–200)
HDL: 30 mg/dL — ABNORMAL LOW (ref >40)
LDL Cholesterol: UNDETERMINED mg/dL (ref 0–99)
Total CHOL/HDL Ratio: 7.5 RATIO
Triglycerides: 535 mg/dL — ABNORMAL HIGH (ref <150)
VLDL: UNDETERMINED mg/dL (ref 0–40)

## 2016-01-08 LAB — CBC
HCT: 39.4 % (ref 39.0–52.0)
Hemoglobin: 12.9 g/dL — ABNORMAL LOW (ref 13.0–17.0)
MCH: 28.5 pg (ref 26.0–34.0)
MCHC: 32.7 g/dL (ref 30.0–36.0)
MCV: 87 fL (ref 78.0–100.0)
Platelets: 206 10*3/uL (ref 150–400)
RBC: 4.53 MIL/uL (ref 4.22–5.81)
RDW: 13.5 % (ref 11.5–15.5)
WBC: 6.4 10*3/uL (ref 4.0–10.5)

## 2016-01-08 LAB — POCT I-STAT 3, ART BLOOD GAS (G3+)
Acid-base deficit: 1 mmol/L (ref 0.0–2.0)
Bicarbonate: 23.8 mEq/L (ref 20.0–24.0)
O2 Saturation: 96 %
Patient temperature: 98.6
TCO2: 25 mmol/L (ref 0–100)
pCO2 arterial: 38.9 mmHg (ref 35.0–45.0)
pH, Arterial: 7.394 (ref 7.350–7.450)
pO2, Arterial: 82 mmHg (ref 80.0–100.0)

## 2016-01-08 LAB — COMPREHENSIVE METABOLIC PANEL
ALT: 34 U/L (ref 17–63)
AST: 25 U/L (ref 15–41)
Albumin: 3.5 g/dL (ref 3.5–5.0)
Alkaline Phosphatase: 44 U/L (ref 38–126)
Anion gap: 8 (ref 5–15)
BUN: 12 mg/dL (ref 6–20)
CO2: 25 mmol/L (ref 22–32)
Calcium: 8.7 mg/dL — ABNORMAL LOW (ref 8.9–10.3)
Chloride: 107 mmol/L (ref 101–111)
Creatinine, Ser: 0.92 mg/dL (ref 0.61–1.24)
GFR calc Af Amer: >60 mL/min (ref >60)
GFR calc non Af Amer: >60 mL/min (ref >60)
Glucose, Bld: 180 mg/dL — ABNORMAL HIGH (ref 65–99)
Potassium: 3.7 mmol/L (ref 3.5–5.1)
Sodium: 140 mmol/L (ref 135–145)
Total Bilirubin: 0.3 mg/dL (ref 0.3–1.2)
Total Protein: 6.3 g/dL — ABNORMAL LOW (ref 6.5–8.1)

## 2016-01-08 LAB — PREALBUMIN: Prealbumin: 26 mg/dL (ref 18–38)

## 2016-01-08 MED ORDER — ALBUTEROL SULFATE (2.5 MG/3ML) 0.083% IN NEBU
2.5000 mg | INHALATION_SOLUTION | Freq: Once | RESPIRATORY_TRACT | Status: AC
Start: 2016-01-08 — End: 2016-01-08
  Administered 2016-01-08: 2.5 mg via RESPIRATORY_TRACT

## 2016-01-08 NOTE — Progress Notes (Signed)
TELEMETRY: Reviewed telemetry pt in sinus brady: Filed Vitals:   01/08/16 1200 01/08/16 1224 01/08/16 1300 01/08/16 1400  BP: 115/71  149/71 131/70  Pulse: 73  57 72  Temp:  98.4 F (36.9 C)    TempSrc:  Oral    Resp: Height:      Weight:      SpO2: 96%  100% 97%    Intake/Output Summary (Last 24 hours) at 01/08/16 1411 Last data filed at 01/08/16 1300  Gross per 24 hour  Intake 1221.6 ml  Output   1400 ml  Net -178.4 ml   Filed Weights   01/07/16 1133 01/08/16 0530  Weight: 271 lb (122.925 kg) 273 lb 4.8 oz (123.968 kg)    Subjective No chest pain or SOB  . losartan  100 mg Oral Daily  . metoprolol succinate  25 mg Oral Daily  . mometasone-formoterol  2 puff Inhalation BID  . sodium chloride flush  3 mL Intravenous Q12H      LABS: Basic Metabolic Panel:  Recent Labs  16/10/96 1327 01/08/16 0231  NA 139 140  K 4.1 3.7  CL 103 107  CO2 24 25  GLUCOSE 189* 180*  BUN 17 12  CREATININE 1.13 0.92  CALCIUM 9.2 8.7*   Liver Function Tests:  Recent Labs  01/08/16 0231  AST 25  ALT 34  ALKPHOS 44  BILITOT 0.3  PROT 6.3*  ALBUMIN 3.5   No results for input(s): LIPASE, AMYLASE in the last 72 hours. CBC:  Recent Labs  01/06/16 1327 01/08/16 0231  WBC 7.9 6.4  HGB 13.2 12.9*  HCT 38.6 39.4  MCV 85.4 87.0  PLT 232 206   Cardiac Enzymes: No results for input(s): CKTOTAL, CKMB, CKMBINDEX, TROPONINI in the last 72 hours. BNP: No results for input(s): PROBNP in the last 72 hours. D-Dimer: No results for input(s): DDIMER in the last 72 hours. Hemoglobin A1C: No results for input(s): HGBA1C in the last 72 hours. Fasting Lipid Panel:  Recent Labs  01/08/16 0231  CHOL 224*  HDL 30*  LDLCALC UNABLE TO CALCULATE IF TRIGLYCERIDE OVER 400 mg/dL  TRIG 045*  CHOLHDL 7.5   Thyroid Function Tests: No results for input(s): TSH, T4TOTAL, T3FREE, THYROIDAB in the last 72 hours.  Invalid input(s): FREET3   Radiology/Studies:  Dg  Chest 2 View  01/08/2016  CLINICAL DATA:  Chest pain, shortness breath EXAM: CHEST  2 VIEW COMPARISON:  CT chest dated 12/18/2015 FINDINGS: Lungs are clear.  No pleural effusion or pneumothorax. The heart is normal in size. Degenerative changes of the visualized thoracolumbar spine. IMPRESSION: No evidence of acute cardiopulmonary disease. Electronically Signed   By: Charline Bills M.D.   On: 01/08/2016 07:15    PHYSICAL EXAM General: Well developed, obese, in no acute distress. Head: Normocephalic, atraumatic, sclera non-icteric, oropharynx is clear Neck: Negative for carotid bruits. JVD not elevated. No adenopathy Lungs: Clear bilaterally to auscultation without wheezes, rales, or rhonchi. Breathing is unlabored. Heart: RRR S1 S2 without murmurs, rubs, or gallops.  Abdomen: Soft, non-tender, non-distended with normoactive bowel sounds. No hepatomegaly. No rebound/guarding. No obvious abdominal masses. Msk:  Strength and tone appears normal for age. Extremities: No clubbing, cyanosis or edema.  Distal pedal pulses are 2+ and equal bilaterally. Neuro: Alert and oriented X 3. Moves all extremities spontaneously. Psych:  Responds to questions appropriately with a normal affect.  ASSESSMENT AND PLAN: 1. 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 On  beta blocker. Not on ASA due to allergy. Plavix held for surgery. Awaiting CT surgery.  2. HTN 3. Aortic root dilation 3.9 cm 4. HLD  Present on Admission:  . Exertional angina (HCC) . Hyperlipidemia . HTN (hypertension) . Obesity (BMI 30-39.9) . Tobacco use disorder  Signed, Tommye Lehenbauer [place], MDFACC 01/08/2016 2:11 PM

## 2016-01-08 NOTE — Telephone Encounter (Signed)
Patient has not been charged

## 2016-01-08 NOTE — Consult Note (Signed)
Reason for Consult:Left main/  3 Vessel CAD Referring Physician: Dr. Varanasi/ [name]  Dennis Hopkins is an 55 y.o. male.  HPI: 55 yo man with a history of obesity, hypertension, hyperlipidemia, former tobacco abuse (2 ppd x 30 years, quit 2 years ago) who presents with a cc/o CP. He has been having symptoms for about 4-5 months. He saw Dr. [name] in May. He refused cath and was started on metoprolol and plavix (allergy to ASA). He continued to have pain with exertion but is very vague on the amount of exertion. He has had some episodes with minimal or no exertion. He describes the pain as a burning sensation in the left chest associated with shortness of breath. He did have pain with the left coronary injection. He is currently pain free.  Past Medical History  Diagnosis Date  . Hyperlipemia   . Hypertension   . Kidney stones 2013  . Chickenpox     Past Surgical History  Procedure Laterality Date  . Tonsillectomy    . Cardiac catheterization N/A 01/07/2016    Procedure: Left Heart Cath and Coronary Angiography;  Surgeon: Jayadeep S Varanasi, MD;  Location: [facility];  Service: Cardiovascular;  Laterality: N/A;    Family History  Problem Relation Age of Onset  . Arthritis Mother   . Arthritis Father   . Hyperlipidemia Father   . Diabetes Brother     Social History:  reports that he quit smoking about 2 years ago. His smoking use included Cigarettes. He has a 70 pack-year smoking history. He has never used smokeless tobacco. He reports that he drinks alcohol. He reports that he does not use illicit drugs.  Allergies:  Allergies  Allergen Reactions  . Asa [Aspirin] Anaphylaxis and Swelling    Lips swelling    Medications:  Prior to Admission:  Prescriptions prior to admission  Medication Sig Dispense Refill Last Dose  . clopidogrel (PLAVIX) 75 MG tablet Take 1 tablet (75 mg total) by mouth daily. 30 tablet 12 01/07/2016 at 0830  . losartan (COZAAR) 100 MG tablet  Take 1 tablet (100 mg total) by mouth daily. 90 tablet 3 01/07/2016 at Unknown time  . metoprolol succinate (TOPROL XL) 25 MG 24 hr tablet Take 1 tablet (25 mg total) by mouth daily. 90 tablet 3 01/07/2016 at Unknown time  . mometasone-formoterol (DULERA) 100-5 MCG/ACT AERO 2 puffs bid (Patient taking differently: Inhale 2 puffs into the lungs 2 (two) times daily. ) 1 Inhaler 2 Past Week at Unknown time    Results for orders placed or performed during the hospital encounter of 01/07/16 (from the past 48 hour(s))  MRSA PCR Screening     Status: None   Collection Time: 01/07/16  8:15 PM  Result Value Ref Range   MRSA by PCR NEGATIVE NEGATIVE    Comment:        The GeneXpert MRSA Assay (FDA approved for NASAL specimens only), is one component of a comprehensive MRSA colonization surveillance program. It is not intended to diagnose MRSA infection nor to guide or monitor treatment for MRSA infections.   Comprehensive metabolic panel     Status: Abnormal   Collection Time: 01/08/16  2:31 AM  Result Value Ref Range   Sodium 140 135 - 145 mmol/L   Potassium 3.7 3.5 - 5.1 mmol/L   Chloride 107 101 - 111 mmol/L   CO2 25 22 - 32 mmol/L   Glucose, Bld 180 (H) 65 - 99 mg/dL   BUN   12 6 - 20 mg/dL   Creatinine, Ser 0.92 0.61 - 1.24 mg/dL   Calcium 8.7 (L) 8.9 - 10.3 mg/dL   Total Protein 6.3 (L) 6.5 - 8.1 g/dL   Albumin 3.5 3.5 - 5.0 g/dL   AST 25 15 - 41 U/L   ALT 34 17 - 63 U/L   Alkaline Phosphatase 44 38 - 126 U/L   Total Bilirubin 0.3 0.3 - 1.2 mg/dL   GFR calc non Af Amer >60 >60 mL/min   GFR calc Af Amer >60 >60 mL/min    Comment: (NOTE) The eGFR has been calculated using the CKD EPI equation. This calculation has not been validated in all clinical situations. eGFR's persistently <60 mL/min signify possible Chronic Kidney Disease.    Anion gap 8 5 - 15  CBC     Status: Abnormal   Collection Time: 01/08/16  2:31 AM  Result Value Ref Range   WBC 6.4 4.0 - 10.5 K/uL   RBC 4.53  4.22 - 5.81 MIL/uL   Hemoglobin 12.9 (L) 13.0 - 17.0 g/dL   HCT 39.4 39.0 - 52.0 %   MCV 87.0 78.0 - 100.0 fL   MCH 28.5 26.0 - 34.0 pg   MCHC 32.7 30.0 - 36.0 g/dL   RDW 13.5 11.5 - 15.5 %   Platelets 206 150 - 400 K/uL  Lipid panel     Status: Abnormal   Collection Time: 01/08/16  2:31 AM  Result Value Ref Range   Cholesterol 224 (H) 0 - 200 mg/dL   Triglycerides 535 (H) <150 mg/dL   HDL 30 (L) >40 mg/dL   Total CHOL/HDL Ratio 7.5 RATIO   VLDL UNABLE TO CALCULATE IF TRIGLYCERIDE OVER 400 mg/dL 0 - 40 mg/dL   LDL Cholesterol UNABLE TO CALCULATE IF TRIGLYCERIDE OVER 400 mg/dL 0 - 99 mg/dL    Comment:        Total Cholesterol/HDL:CHD Risk Coronary Heart Disease Risk Table                     Men   Women  1/2 Average Risk   3.4   3.3  Average Risk       5.0   4.4  2 X Average Risk   9.6   7.1  3 X Average Risk  23.4   11.0        Use the calculated Patient Ratio above and the CHD Risk Table to determine the patient's CHD Risk.        ATP III CLASSIFICATION (LDL):  <100     mg/dL   Optimal  100-129  mg/dL   Near or Above                    Optimal  130-159  mg/dL   Borderline  160-189  mg/dL   High  >190     mg/dL   Very High   Prealbumin     Status: None   Collection Time: 01/08/16  2:31 AM  Result Value Ref Range   Prealbumin 26.0 18 - 38 mg/dL  I-STAT 3, arterial blood gas (G3+)     Status: None   Collection Time: 01/08/16  4:12 AM  Result Value Ref Range   pH, Arterial 7.394 7.350 - 7.450   pCO2 arterial 38.9 35.0 - 45.0 mmHg   pO2, Arterial 82.0 80.0 - 100.0 mmHg   Bicarbonate 23.8 20.0 - 24.0 mEq/L   TCO2 25 0 - 100 mmol/L     O2 Saturation 96.0 %   Acid-base deficit 1.0 0.0 - 2.0 mmol/L   Patient temperature 98.6 F    Collection site RADIAL, ALLEN'S TEST ACCEPTABLE    Drawn by RT    Sample type ARTERIAL     Dg Chest 2 View  01/08/2016  CLINICAL DATA:  Chest pain, shortness breath EXAM: CHEST  2 VIEW COMPARISON:  CT chest dated 12/18/2015 FINDINGS: Lungs  are clear.  No pleural effusion or pneumothorax. The heart is normal in size. Degenerative changes of the visualized thoracolumbar spine. IMPRESSION: No evidence of acute cardiopulmonary disease. Electronically Signed   By: Julian Hy M.D.   On: 01/08/2016 07:15    Review of Systems  Constitutional: Positive for malaise/fatigue. Negative for fever and chills.  Respiratory: Positive for shortness of breath. Negative for wheezing.   Cardiovascular: Positive for chest pain. Negative for orthopnea, claudication and leg swelling.  Gastrointestinal: Negative for nausea, vomiting and blood in stool.  Genitourinary: Negative for dysuria and frequency.  Neurological: Negative for focal weakness and loss of consciousness.  Endo/Heme/Allergies: Does not bruise/bleed easily.  All other systems reviewed and are negative.  Blood pressure 145/68, pulse 78, temperature 98.5 F (36.9 C), temperature source Oral, resp. rate 20, height 5' 10" (1.778 m), weight 273 lb 4.8 oz (123.968 kg), SpO2 99 %. Physical Exam  Vitals reviewed. Constitutional: He is oriented to person, place, and time. No distress.  obese  HENT:  Head: Normocephalic and atraumatic.  Mouth/Throat: No oropharyngeal exudate.  Very poor dentition  Eyes: Conjunctivae and EOM are normal. No scleral icterus.  Neck: Neck supple. No thyromegaly present.  No bruits  Cardiovascular: Normal rate, regular rhythm, normal heart sounds and intact distal pulses.  Exam reveals no gallop and no friction rub.   No murmur heard. Respiratory: Effort normal and breath sounds normal. No respiratory distress. He has no wheezes. He has no rales.  GI: Soft. He exhibits no distension. There is no tenderness.  Musculoskeletal: He exhibits no edema.  Lymphadenopathy:    He has no cervical adenopathy.  Neurological: He is alert and oriented to person, place, and time. No cranial nerve deficit.  No focal motor deficit  Skin: Skin is warm and dry.    CARDIAC CATHETERIZATION Conclusion     Prox RCA lesion, 90% stenosed.  Mid RCA lesion, 50% stenosed.  Ost RCA lesion, 50% stenosed.  Ost LM to LM lesion, 75% stenosed.  Distal LM lesion, 60% stenosed.  Mid LAD lesion, 25% stenosed.  Ost Cx lesion, 40% stenosed.  The left ventricular systolic function is normal.  LVEDP 21 mm Hg.  Will obtain cardiac surgery consultation. He will need aggressive secondary prevention. Will watch overnight given that he had chest pain and ECG changes with left coronary injections. He is on Plavix. Will hold this medicine for now in anticipation of CABG.    I personally reviewed the cath films and agree with above he also has significant disease in the proximal/ mid PDA  Assessment/Plan:  55 yo man with multiple CRF including obesity, hypertension, hyperlipidemia and tobacco abuse. He does not have a strong family history of premature CAD.  CABG is indicated in the setting of symptomatic left main/ 3 VD for survival benefit and relief of symptoms.  I discussed the proposed operation with Mr. Severns and his family. I informed them of the general nature of the procedure, the incisions to used, the use of cardiopulmonary bypass, the expected hospital stay and overall recovery. He became argumentative  during the discussion saying he wanted it done through a "tiny incision" so he could go to the beach with his grandchildren in 2 weeks. I tried to explain that I did not think that would be the best way to do his operation and that we don't do bilateral thoracotomy CABG at [facility]. An even if it was done in that fashion he would not be going to the beach 2 weeks after.  His wife was understanding but he was less so. They have already paid for a place to stay. I informed then I would be happy to write a letter of medical necessity for him.  I did inform them of the indications, risks, benefits and alternatives. He understands the risks include but are not  limited to death, MI, DVT, PE, stroke, bleeding, possible need for transfusion, infection, cardiac arrhythmias, pleural effusions, as well as the possibility of other organ system dysfunction including renal, respiratory, and GI complications.  He says he will consider it, but my impression is that he will refuse to proceed.  We have penciled him onto the OR schedule for next Wednesday for CABG should he agree to it.   Steven C Hendrickson 01/08/2016, 4:28 PM      

## 2016-01-08 NOTE — Progress Notes (Signed)
Pre-op Cardiac Surgery  Carotid Findings:  Findings consistent with 1- 39 percent stenosis involving the right internal carotid artery and the left internal carotid artery.   Upper Extremity Right Left  Brachial Pressures 162 Triphasic 168 Triphasic  Radial Waveforms Triphasic Triphasic  Ulnar Waveforms Triphasic Triphasic  Palmar Arch (Allen's Test) decreased<50%  decreased<50%    Findings:   Palmar Arch evaluation waveforms remain within normal limits with compression in the right and left radial artery bilaterally and decreased<50%  in bilateral ulnar arteries.   Lower  Extremity Right Left  Dorsalis Pedis 180 Triphasic 192 Triphasic  Posterior Tibial 189 Triphasic 207Triphasic  Ankle/Brachial Indices 1.1 1.2    Findings:   ABI's and Doppler waveforms bilaterally are with normal limits at rest.

## 2016-01-08 NOTE — Progress Notes (Addendum)
Patient requested to allow his wife sleep at the bedside instead of visitor's waiting area. When we told the patient about the policy for sleeping, patient became angry. They were concerned about the safety of his wife sleeping alone in the waiting area. They said that they were not prepared for this before coming to the hospital. Patient's wife has a scooter which is a potential hazard in the room in case if the patient codes. Patient's wife refused to sleep in the waiting area and requested to talk to the charge nurse.   Charge nurse Chrissie NoaWilliam explained and educated the patient and his wife about the potential risks involved for having a scooter with his wife sleeping near the bedside. They were explained that having a scooter on the way and the wife who is unable to walk quickly will delay the care and treatment in case if the patient codes or requires any emergency treatment.    The patient and his wife verbalized understanding of the safety risks for sleeping at the bedside and refused to leave the room.    Patient denied any pain and the right radial site is at level 0. Will continue to monitor.

## 2016-01-09 DIAGNOSIS — F17200 Nicotine dependence, unspecified, uncomplicated: Secondary | ICD-10-CM | POA: Diagnosis not present

## 2016-01-09 DIAGNOSIS — I25110 Atherosclerotic heart disease of native coronary artery with unstable angina pectoris: Secondary | ICD-10-CM

## 2016-01-09 DIAGNOSIS — I10 Essential (primary) hypertension: Secondary | ICD-10-CM | POA: Diagnosis not present

## 2016-01-09 DIAGNOSIS — E785 Hyperlipidemia, unspecified: Secondary | ICD-10-CM | POA: Diagnosis not present

## 2016-01-09 LAB — HEMOGLOBIN A1C
Hgb A1c MFr Bld: 7.4 % — ABNORMAL HIGH (ref 4.8–5.6)
Mean Plasma Glucose: 166 mg/dL

## 2016-01-09 MED ORDER — MAGNESIUM HYDROXIDE 400 MG/5ML PO SUSP
30.0000 mL | Freq: Every day | ORAL | Status: DC | PRN
  Administered 2016-01-09: 30 mL via ORAL
  Filled 2016-01-09: qty 30

## 2016-01-09 NOTE — Progress Notes (Addendum)
Primary cardiologist: Dr. Olga Millers  Seen for followup: Multivessel CAD  Subjective:    Seated in bed side chair. Reports no chest pain. States that he has not yet decided about CABG, plans to talk with his wife further today. He was seen in consultation by Dr. Dorris Fetch yesterday.  Objective:   Temp:  [98.3 F (36.8 C)-98.9 F (37.2 C)] 98.3 F (36.8 C) (07/22 0700) Pulse Rate:  [56-86] 78 (07/22 1000) Resp:  [0-22] 15 (07/22 1000) BP: (108-155)/(55-75) 128/55 mmHg (07/22 1000) SpO2:  [95 %-100 %] 99 % (07/22 1000) Last BM Date: 01/09/16  Filed Weights   01/07/16 1133 01/08/16 0530  Weight: 271 lb (122.925 kg) 273 lb 4.8 oz (123.968 kg)    Intake/Output Summary (Last 24 hours) at 01/09/16 1033 Last data filed at 01/09/16 0100  Gross per 24 hour  Intake    780 ml  Output   2000 ml  Net  -1220 ml    Telemetry: Sinus rhythm.  Exam:  General: Obese male, no distress.  Lungs: Clear, diminished breath sounds.  Cardiac: RRR without gallop.  Abdomen: NABS.  Extremities: No pitting edema.  Lab Results:  Basic Metabolic Panel:  Recent Labs Lab 01/06/16 1327 01/08/16 0231  NA 139 140  K 4.1 3.7  CL 103 107  CO2 24 25  GLUCOSE 189* 180*  BUN 17 12  CREATININE 1.13 0.92  CALCIUM 9.2 8.7*    Liver Function Tests:  Recent Labs Lab 01/08/16 0231  AST 25  ALT 34  ALKPHOS 44  BILITOT 0.3  PROT 6.3*  ALBUMIN 3.5    CBC:  Recent Labs Lab 01/06/16 1327 01/08/16 0231  WBC 7.9 6.4  HGB 13.2 12.9*  HCT 38.6 39.4  MCV 85.4 87.0  PLT 232 206    Cardiac catheterization 01/07/2016:  Prox RCA lesion, 90% stenosed.  Mid RCA lesion, 50% stenosed.  Ost RCA lesion, 50% stenosed.  Ost LM to LM lesion, 75% stenosed.  Distal LM lesion, 60% stenosed.  Mid LAD lesion, 25% stenosed.  Ost Cx lesion, 40% stenosed.  The left ventricular systolic function is normal.  LVEDP 21 mm Hg.  Will obtain cardiac surgery consultation. He will  need aggressive secondary prevention. Will watch overnight given that he had chest pain and ECG changes with left coronary injections. He is on Plavix. Will hold this medicine for now in anticipation of CABG.    Medications:   Scheduled Medications: . losartan  100 mg Oral Daily  . metoprolol succinate  25 mg Oral Daily  . mometasone-formoterol  2 puff Inhalation BID  . sodium chloride flush  3 mL Intravenous Q12H      PRN Medications:  sodium chloride, acetaminophen, ondansetron (ZOFRAN) IV, sodium chloride flush   Assessment:   1. 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 LVEF normal with LVEDP 21 mmHg at angiography. Revascularization with CABG has been recommended, I reviewed Dr. Sunday Corn consultation note from yesterday. Patient is still undecided at this point but plans to speak with his wife today further.  2. Aspirin allergy. He was previously on Plavix although this was held in anticipation of CABG.  3. Essential hypertension by history. Blood pressure control reasonable at this time. He is on metoprolol and losartan.  4. Hyperlipidemia by history. Recent lipid panel shows triglycerides 535, total cholesterol 224, LDL not calculated. Prior LDL was 146.  5. 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  Plan/Discussion:    Continue metoprolol and losartan. Add statin therapy. Not on aspirin due to allergy, and Plavix was stopped in anticipation of CABG. Patient still deciding about CABG plan, states that he will speak with his wife today.   Jonelle Sidle, M.D., F.A.C.C.

## 2016-01-10 DIAGNOSIS — I10 Essential (primary) hypertension: Secondary | ICD-10-CM | POA: Diagnosis not present

## 2016-01-10 DIAGNOSIS — E785 Hyperlipidemia, unspecified: Secondary | ICD-10-CM | POA: Diagnosis not present

## 2016-01-10 DIAGNOSIS — I25110 Atherosclerotic heart disease of native coronary artery with unstable angina pectoris: Secondary | ICD-10-CM | POA: Diagnosis not present

## 2016-01-10 DIAGNOSIS — F17200 Nicotine dependence, unspecified, uncomplicated: Secondary | ICD-10-CM | POA: Diagnosis not present

## 2016-01-10 LAB — CBC
HCT: 40.3 % (ref 39.0–52.0)
Hemoglobin: 13.4 g/dL (ref 13.0–17.0)
MCH: 28.8 pg (ref 26.0–34.0)
MCHC: 33.3 g/dL (ref 30.0–36.0)
MCV: 86.7 fL (ref 78.0–100.0)
Platelets: 218 10*3/uL (ref 150–400)
RBC: 4.65 MIL/uL (ref 4.22–5.81)
RDW: 13.5 % (ref 11.5–15.5)
WBC: 8.4 10*3/uL (ref 4.0–10.5)

## 2016-01-10 LAB — VAS US DOPPLER PRE CABG
LEFT ECA DIAS: -2 cm/s
LEFT VERTEBRAL DIAS: -18 cm/s
Left CCA dist dias: -23 cm/s
Left CCA dist sys: -116 cm/s
Left CCA prox dias: 20 cm/s
Left CCA prox sys: 118 cm/s
Left ICA dist dias: -29 cm/s
Left ICA dist sys: -115 cm/s
Left ICA prox dias: -36 cm/s
Left ICA prox sys: -100 cm/s
RIGHT ECA DIAS: -22 cm/s
RIGHT VERTEBRAL DIAS: -22 cm/s
Right CCA prox dias: -19 cm/s
Right CCA prox sys: -134 cm/s
Right cca dist sys: -106 cm/s

## 2016-01-10 LAB — BASIC METABOLIC PANEL
Anion gap: 7 (ref 5–15)
BUN: 15 mg/dL (ref 6–20)
CO2: 26 mmol/L (ref 22–32)
Calcium: 8.8 mg/dL — ABNORMAL LOW (ref 8.9–10.3)
Chloride: 105 mmol/L (ref 101–111)
Creatinine, Ser: 0.83 mg/dL (ref 0.61–1.24)
GFR calc Af Amer: >60 mL/min (ref >60)
GFR calc non Af Amer: >60 mL/min (ref >60)
Glucose, Bld: 149 mg/dL — ABNORMAL HIGH (ref 65–99)
Potassium: 3.9 mmol/L (ref 3.5–5.1)
Sodium: 138 mmol/L (ref 135–145)

## 2016-01-10 MED ORDER — ATORVASTATIN CALCIUM 80 MG PO TABS
80.0000 mg | ORAL_TABLET | Freq: Every day | ORAL | Status: DC
  Administered 2016-01-10 – 2016-01-17 (×6): 80 mg via ORAL
  Filled 2016-01-10 (×7): qty 1

## 2016-01-10 NOTE — Progress Notes (Signed)
Primary cardiologist: Dr. Olga Millers  Seen for followup: Multivessel CAD  Subjective:    Reports no chest pain, somewhat short of breath with activity in room. Seems to be more amenable to CABG, tells me that he talked with his wife and is "headed in the right direction" now.  Objective:   Temp:  [97.2 F (36.2 C)-99.1 F (37.3 C)] 98.3 F (36.8 C) (07/23 0700) Pulse Rate:  [63-86] 69 (07/23 0700) Resp:  [11-20] 12 (07/23 0700) BP: (109-140)/(51-76) 132/69 (07/23 0700) SpO2:  [93 %-100 %] 99 % (07/23 0828) Last BM Date: 01/07/16  Filed Weights   01/07/16 1133 01/08/16 0530  Weight: 271 lb (122.9 kg) 273 lb 4.8 oz (124 kg)    Intake/Output Summary (Last 24 hours) at 01/10/16 1008 Last data filed at 01/10/16 0600  Gross per 24 hour  Intake              240 ml  Output              750 ml  Net             -510 ml    Telemetry: Sinus rhythm.  Exam:  General: Obese male, no distress.  Lungs: Clear, diminished breath sounds.  Cardiac: RRR without gallop.  Abdomen: NABS.  Extremities: No pitting edema.  Lab Results:  Basic Metabolic Panel:  Recent Labs Lab 01/06/16 1327 01/08/16 0231 01/10/16 0255  NA 139 140 138  K 4.1 3.7 3.9  CL 103 107 105  CO2 GLUCOSE 189* 180* 149*  BUN CREATININE 1.13 0.92 0.83  CALCIUM 9.2 8.7* 8.8*    Liver Function Tests:  Recent Labs Lab 01/08/16 0231  AST 25  ALT 34  ALKPHOS 44  BILITOT 0.3  PROT 6.3*  ALBUMIN 3.5    CBC:  Recent Labs Lab 01/06/16 1327 01/08/16 0231 01/10/16 0255  WBC 7.9 6.4 8.4  HGB 13.2 12.9* 13.4  HCT 38.6 39.4 40.3  MCV 85.4 87.0 86.7  PLT 232 206 218    Cardiac catheterization 01/07/2016:  Prox RCA lesion, 90% stenosed.  Mid RCA lesion, 50% stenosed.  Ost RCA lesion, 50% stenosed.  Ost LM to LM lesion, 75% stenosed.  Distal LM lesion, 60% stenosed.  Mid LAD lesion, 25% stenosed.  Ost Cx lesion, 40% stenosed.  The left ventricular systolic  function is normal.  LVEDP 21 mm Hg.  Will obtain cardiac surgery consultation. He will need aggressive secondary prevention. Will watch overnight given that he had chest pain and ECG changes with left coronary injections. He is on Plavix. Will hold this medicine for now in anticipation of CABG.    Medications:   Scheduled Medications: . losartan  100 mg Oral Daily  . metoprolol succinate  25 mg Oral Daily  . mometasone-formoterol  2 puff Inhalation BID  . sodium chloride flush  3 mL Intravenous Q12H     PRN Medications: sodium chloride, acetaminophen, magnesium hydroxide, ondansetron (ZOFRAN) IV, sodium chloride flush   Assessment:   1. 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 LVEF normal with LVEDP 21 mmHg at angiography. Revascularization with CABG has been recommended, I reviewed Dr. Sunday Corn consultation note. Patient Seems more amenable to considering CABG now, has talked things over with his wife.   2. Aspirin allergy. He was previously on Plavix although this was held in anticipation of CABG.  3. Essential hypertension by history. Blood pressure  control reasonable at this time. He is on metoprolol and losartan.  4. Hyperlipidemia by history. Recent lipid panel shows triglycerides 535, total cholesterol 224, LDL not calculated. Prior LDL was 146.  5. 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   Plan/Discussion:    Continue metoprolol, losartan and statin therapy. Not on aspirin due to allergy and Plavix was stopped in anticipation of CABG. Patient will talk with Dr. Dorris Fetch again tomorrow, likely anticipate surgery this week.  Jonelle Sidle, M.D., F.A.C.C.

## 2016-01-11 ENCOUNTER — Encounter: Payer: Self-pay | Admitting: Thoracic Surgery (Cardiothoracic Vascular Surgery)

## 2016-01-11 DIAGNOSIS — I208 Other forms of angina pectoris: Secondary | ICD-10-CM | POA: Diagnosis not present

## 2016-01-11 LAB — PULMONARY FUNCTION TEST
FEF 25-75 Post: 2.74 L/sec
FEF 25-75 Pre: 3.01 L/sec
FEF2575-%Change-Post: -8 %
FEF2575-%Pred-Post: 84 %
FEF2575-%Pred-Pre: 93 %
FEV1-%Change-Post: 8 %
FEV1-%Pred-Post: 63 %
FEV1-%Pred-Pre: 58 %
FEV1-Post: 2.41 L
FEV1-Pre: 2.23 L
FEV1FVC-%Change-Post: -4 %
FEV1FVC-%Pred-Pre: 112 %
FEV6-%Change-Post: 13 %
FEV6-%Pred-Post: 62 %
FEV6-%Pred-Pre: 54 %
FEV6-Post: 2.93 L
FEV6-Pre: 2.58 L
FEV6FVC-%Pred-Post: 104 %
FEV6FVC-%Pred-Pre: 104 %
FVC-%Change-Post: 13 %
FVC-%Pred-Post: 59 %
FVC-%Pred-Pre: 52 %
FVC-Post: 2.93 L
FVC-Pre: 2.58 L
Post FEV1/FVC ratio: 82 %
Post FEV6/FVC ratio: 100 %
Pre FEV1/FVC ratio: 86 %
Pre FEV6/FVC Ratio: 100 %

## 2016-01-11 NOTE — Progress Notes (Signed)
      [address]       [place] [address]             [phone]      No complaints, denies CP  BP 119/70 (BP Location: Left Arm)   Pulse 84   Temp 98.5 F (36.9 C) (Oral)   Resp (!) 21   Ht 5\' 10"  (1.778 m)   Wt 273 lb 4.8 oz (124 kg)   SpO2 98%   BMI 39.21 kg/m    Intake/Output Summary (Last 24 hours) at 01/11/16 1801 Last data filed at 01/11/16 1600  Gross per 24 hour  Intake              720 ml  Output             1925 ml  Net            -1205 ml    He has decided to proceed with CABG On schedule for Wednesday 7/26 AM

## 2016-01-11 NOTE — Progress Notes (Signed)
CARDIAC REHAB PHASE I   Pt with significant LM disease, reports some chest discomfort with minimal exertion. MD to advise if pt appropriate for ambulation prior to surgery. Cardiac surgery pre-op education completed with pt. Reviewed IS, sternal precautions, activity progression, cardiac surgery booklet and cardiac surgery guidelines. Pt does not have a phone in his room to play cardiac surgery videos, advised pt to ask RN for assistance if he wishes to view surgery videos prior to surgery. Pt verbalized understanding, receptive. Pt sitting on edge of bed, call bell within reach. Will follow.   [id number] Joylene Grapes, RN, BSN 01/11/2016 12:02 PM

## 2016-01-11 NOTE — Progress Notes (Signed)
.    Patient Name: Dennis Hopkins Date of Encounter: 01/11/2016  Principal Problem:   Exertional angina [facility]) Active Problems:   Obesity (BMI 30-39.9)   Tobacco use disorder   HTN (hypertension)   Hyperlipidemia   Length of Stay: 0  SUBJECTIVE  Seems calm, accepting the need for surgery. No angina or dyspnea.  CURRENT MEDS . atorvastatin  80 mg Oral q1800  . losartan  100 mg Oral Daily  . metoprolol succinate  25 mg Oral Daily  . mometasone-formoterol  2 puff Inhalation BID  . sodium chloride flush  3 mL Intravenous Q12H    OBJECTIVE   Intake/Output Summary (Last 24 hours) at 01/11/16 0823 Last data filed at 01/10/16 2039  Gross per 24 hour  Intake              480 ml  Output             1425 ml  Net             -945 ml   Filed Weights   01/07/16 1133 01/08/16 0530  Weight: 122.9 kg (271 lb) 124 kg (273 lb 4.8 oz)    PHYSICAL EXAM Vitals:   01/11/16 0400 01/11/16 0500 01/11/16 0600 01/11/16 0700  BP: (!) 123/52 (!) 105/50 131/70 116/71  Pulse: 72 76 64 77  Resp: 12 18 18 12   Temp:      TempSrc:      SpO2: 96% 95% 96% 97%  Weight:      Height:       General: Alert, oriented x3, no distress Head: no evidence of trauma, PERRL, EOMI, no exophtalmos or lid lag, no myxedema, no xanthelasma; normal ears, nose and oropharynx Neck: normal jugular venous pulsations and no hepatojugular reflux; brisk carotid pulses without delay and no carotid bruits Chest: clear to auscultation, no signs of consolidation by percussion or palpation, normal fremitus, symmetrical and full respiratory excursions Cardiovascular: normal position and quality of the apical impulse, regular rhythm, normal first and second heart sounds, no rubs or gallops, no murmur Abdomen: no tenderness or distention, no masses by palpation, no abnormal pulsatility or arterial bruits, normal bowel sounds, no hepatosplenomegaly Extremities: no clubbing, cyanosis or edema; 2+ radial, ulnar and brachial pulses  bilaterally; 2+ right femoral, posterior tibial and dorsalis pedis pulses; 2+ left femoral, posterior tibial and dorsalis pedis pulses; no subclavian or femoral bruits Neurological: grossly nonfocal  LABS  CBC  Recent Labs  01/10/16 0255  WBC 8.4  HGB 13.4  HCT 40.3  MCV 86.7  PLT 218   Basic Metabolic Panel  Recent Labs  01/10/16 0255  NA 138  K 3.9  CL 105  CO2 26  GLUCOSE 149*  BUN 15  CREATININE 0.83  CALCIUM 8.8*   Liver Function Tests   TELE NSR   ASSESSMENT AND PLAN  Young man with 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 ASA allergy, plan to resume clopidogrel at DC. Major risk factors are newly recognized DM (HgbA1c 7.4%), moderate hypercholesterolemia and hypertriglyceridemia (LDL-C 160, TG >500) , previous smoking, treated HTN and obesity. Has mild aortic root dilation. Discussed details about the day of surgery, in-hospital and post-hospital recovery/rehab, return to work, long term care and prognosis.   Thurmon Fair, MD, [facility] [phone] office [phone] pager 01/11/2016 8:23 AM

## 2016-01-12 ENCOUNTER — Other Ambulatory Visit: Payer: Self-pay | Admitting: *Deleted

## 2016-01-12 ENCOUNTER — Encounter (HOSPITAL_COMMUNITY): Payer: BLUE CROSS/BLUE SHIELD

## 2016-01-12 ENCOUNTER — Other Ambulatory Visit (HOSPITAL_COMMUNITY): Payer: Self-pay | Admitting: Respiratory Therapy

## 2016-01-12 DIAGNOSIS — R0609 Other forms of dyspnea: Secondary | ICD-10-CM | POA: Diagnosis present

## 2016-01-12 DIAGNOSIS — E669 Obesity, unspecified: Secondary | ICD-10-CM | POA: Diagnosis present

## 2016-01-12 DIAGNOSIS — D62 Acute posthemorrhagic anemia: Secondary | ICD-10-CM | POA: Diagnosis not present

## 2016-01-12 DIAGNOSIS — Z87891 Personal history of nicotine dependence: Secondary | ICD-10-CM | POA: Diagnosis not present

## 2016-01-12 DIAGNOSIS — R066 Hiccough: Secondary | ICD-10-CM | POA: Diagnosis not present

## 2016-01-12 DIAGNOSIS — Z6839 Body mass index (BMI) 39.0-39.9, adult: Secondary | ICD-10-CM | POA: Diagnosis not present

## 2016-01-12 DIAGNOSIS — J9811 Atelectasis: Secondary | ICD-10-CM | POA: Diagnosis not present

## 2016-01-12 DIAGNOSIS — E119 Type 2 diabetes mellitus without complications: Secondary | ICD-10-CM | POA: Diagnosis present

## 2016-01-12 DIAGNOSIS — I77810 Thoracic aortic ectasia: Secondary | ICD-10-CM | POA: Diagnosis present

## 2016-01-12 DIAGNOSIS — I2510 Atherosclerotic heart disease of native coronary artery without angina pectoris: Secondary | ICD-10-CM

## 2016-01-12 DIAGNOSIS — I208 Other forms of angina pectoris: Secondary | ICD-10-CM | POA: Diagnosis not present

## 2016-01-12 DIAGNOSIS — E785 Hyperlipidemia, unspecified: Secondary | ICD-10-CM | POA: Diagnosis present

## 2016-01-12 DIAGNOSIS — I10 Essential (primary) hypertension: Secondary | ICD-10-CM | POA: Diagnosis present

## 2016-01-12 DIAGNOSIS — I25118 Atherosclerotic heart disease of native coronary artery with other forms of angina pectoris: Secondary | ICD-10-CM | POA: Diagnosis present

## 2016-01-12 DIAGNOSIS — Z79899 Other long term (current) drug therapy: Secondary | ICD-10-CM | POA: Diagnosis not present

## 2016-01-12 DIAGNOSIS — Z886 Allergy status to analgesic agent status: Secondary | ICD-10-CM | POA: Diagnosis not present

## 2016-01-12 DIAGNOSIS — Z7902 Long term (current) use of antithrombotics/antiplatelets: Secondary | ICD-10-CM | POA: Diagnosis not present

## 2016-01-12 DIAGNOSIS — Z833 Family history of diabetes mellitus: Secondary | ICD-10-CM | POA: Diagnosis not present

## 2016-01-12 HISTORY — DX: Atherosclerotic heart disease of native coronary artery without angina pectoris: I25.10

## 2016-01-12 LAB — URINALYSIS, ROUTINE W REFLEX MICROSCOPIC
Bilirubin Urine: NEGATIVE
Glucose, UA: NEGATIVE mg/dL
Hgb urine dipstick: NEGATIVE
Ketones, ur: NEGATIVE mg/dL
Leukocytes, UA: NEGATIVE
Nitrite: NEGATIVE
Protein, ur: NEGATIVE mg/dL
Specific Gravity, Urine: 1.03 (ref 1.005–1.030)
pH: 5 (ref 5.0–8.0)

## 2016-01-12 LAB — PROTIME-INR
INR: 1.07 (ref 0.00–1.49)
Prothrombin Time: 14.1 seconds (ref 11.6–15.2)

## 2016-01-12 LAB — ABO/RH: ABO/RH(D): A POS

## 2016-01-12 LAB — TYPE AND SCREEN
ABO/RH(D): A POS
Antibody Screen: NEGATIVE

## 2016-01-12 MED ORDER — PHENYLEPHRINE HCL 10 MG/ML IJ SOLN
30.0000 ug/min | INTRAVENOUS | Status: AC
  Administered 2016-01-13: 50 ug/min via INTRAVENOUS
  Filled 2016-01-12: qty 2

## 2016-01-12 MED ORDER — DEXMEDETOMIDINE HCL IN NACL 400 MCG/100ML IV SOLN
0.1000 ug/kg/h | INTRAVENOUS | Status: AC
  Administered 2016-01-13: .4 ug/kg/h via INTRAVENOUS
  Filled 2016-01-12: qty 100

## 2016-01-12 MED ORDER — DOPAMINE-DEXTROSE 3.2-5 MG/ML-% IV SOLN
0.0000 ug/kg/min | INTRAVENOUS | Status: DC
  Filled 2016-01-12: qty 250

## 2016-01-12 MED ORDER — NITROGLYCERIN IN D5W 200-5 MCG/ML-% IV SOLN
2.0000 ug/min | INTRAVENOUS | Status: AC
  Administered 2016-01-13: 5 ug/min via INTRAVENOUS
  Filled 2016-01-12: qty 250

## 2016-01-12 MED ORDER — METOPROLOL TARTRATE 12.5 MG HALF TABLET
12.5000 mg | ORAL_TABLET | Freq: Once | ORAL | Status: AC
  Administered 2016-01-13: 12.5 mg via ORAL
  Filled 2016-01-12: qty 1

## 2016-01-12 MED ORDER — VANCOMYCIN HCL 10 G IV SOLR
1250.0000 mg | INTRAVENOUS | Status: AC
  Administered 2016-01-13: 1500 mg via INTRAVENOUS
  Filled 2016-01-12: qty 1250

## 2016-01-12 MED ORDER — CHLORHEXIDINE GLUCONATE 0.12 % MT SOLN
15.0000 mL | Freq: Once | OROMUCOSAL | Status: AC
  Administered 2016-01-13: 15 mL via OROMUCOSAL
  Filled 2016-01-12: qty 15

## 2016-01-12 MED ORDER — POTASSIUM CHLORIDE 2 MEQ/ML IV SOLN
80.0000 meq | INTRAVENOUS | Status: DC
  Filled 2016-01-12: qty 40

## 2016-01-12 MED ORDER — ALPRAZOLAM 0.25 MG PO TABS
0.2500 mg | ORAL_TABLET | ORAL | Status: DC | PRN
  Administered 2016-01-12: 0.25 mg via ORAL
  Filled 2016-01-12: qty 1

## 2016-01-12 MED ORDER — PLASMA-LYTE 148 IV SOLN
INTRAVENOUS | Status: AC
  Administered 2016-01-13: 500 mL
  Filled 2016-01-12: qty 2.5

## 2016-01-12 MED ORDER — DIAZEPAM 5 MG PO TABS
5.0000 mg | ORAL_TABLET | Freq: Once | ORAL | Status: AC
  Administered 2016-01-13: 5 mg via ORAL
  Filled 2016-01-12: qty 1

## 2016-01-12 MED ORDER — CHLORHEXIDINE GLUCONATE 4 % EX LIQD
60.0000 mL | Freq: Once | CUTANEOUS | Status: AC
  Administered 2016-01-13: 4 via TOPICAL
  Filled 2016-01-12 (×2): qty 60

## 2016-01-12 MED ORDER — EPINEPHRINE HCL 1 MG/ML IJ SOLN
0.0000 ug/min | INTRAVENOUS | Status: DC
  Filled 2016-01-12: qty 4

## 2016-01-12 MED ORDER — MAGNESIUM SULFATE 50 % IJ SOLN
40.0000 meq | INTRAMUSCULAR | Status: DC
  Filled 2016-01-12: qty 10

## 2016-01-12 MED ORDER — SODIUM CHLORIDE 0.9 % IV SOLN
INTRAVENOUS | Status: DC
  Filled 2016-01-12: qty 30

## 2016-01-12 MED ORDER — TEMAZEPAM 7.5 MG PO CAPS: 15.0000 mg | ORAL_CAPSULE | Freq: Once | ORAL | Status: DC | PRN

## 2016-01-12 MED ORDER — BISACODYL 5 MG PO TBEC
5.0000 mg | DELAYED_RELEASE_TABLET | Freq: Once | ORAL | Status: AC
  Administered 2016-01-12: 5 mg via ORAL
  Filled 2016-01-12: qty 1

## 2016-01-12 MED ORDER — DEXTROSE 5 % IV SOLN
750.0000 mg | INTRAVENOUS | Status: DC
  Filled 2016-01-12: qty 750

## 2016-01-12 MED ORDER — INSULIN REGULAR HUMAN 100 UNIT/ML IJ SOLN
INTRAMUSCULAR | Status: AC
  Administered 2016-01-13: 1.7 [IU]/h via INTRAVENOUS
  Filled 2016-01-12: qty 2.5

## 2016-01-12 MED ORDER — SODIUM CHLORIDE 0.9 % IV SOLN
INTRAVENOUS | Status: AC
  Administered 2016-01-13: 70 mL/h via INTRAVENOUS
  Filled 2016-01-12: qty 40

## 2016-01-12 MED ORDER — DEXTROSE 5 % IV SOLN
1.5000 g | INTRAVENOUS | Status: AC
  Administered 2016-01-13: 1.5 g via INTRAVENOUS
  Administered 2016-01-13: .75 g via INTRAVENOUS
  Filled 2016-01-12: qty 1.5

## 2016-01-12 MED ORDER — CHLORHEXIDINE GLUCONATE 4 % EX LIQD
60.0000 mL | Freq: Once | CUTANEOUS | Status: AC
  Administered 2016-01-12: 4 via TOPICAL
  Filled 2016-01-12: qty 60

## 2016-01-12 NOTE — Progress Notes (Signed)
  Patient Name: Dennis Hopkins Date of Encounter: 01/12/2016  Principal Problem:   Exertional angina [facility]) Active Problems:   Obesity (BMI 30-39.9)   Tobacco use disorder   HTN (hypertension)   Hyperlipidemia   Length of Stay: 0  SUBJECTIVE  A little "down", but not overly anxious. No angina  CURRENT MEDS . atorvastatin  80 mg Oral q1800  . losartan  100 mg Oral Daily  . metoprolol succinate  25 mg Oral Daily  . mometasone-formoterol  2 puff Inhalation BID  . sodium chloride flush  3 mL Intravenous Q12H    OBJECTIVE   Intake/Output Summary (Last 24 hours) at 01/12/16 0829 Last data filed at 01/12/16 0600  Gross per 24 hour  Intake              930 ml  Output             1750 ml  Net             -820 ml   Filed Weights   01/07/16 1133 01/08/16 0530  Weight: 122.9 kg (271 lb) 124 kg (273 lb 4.8 oz)    PHYSICAL EXAM Vitals:   01/12/16 0500 01/12/16 0600 01/12/16 0700 01/12/16 0822  BP: 117/66 126/69 133/80 131/62  Pulse: 65 92 75 71  Resp: 18 (!) 27 13 12   Temp:      TempSrc:      SpO2: 98% 100% 98% 100%  Weight:      Height:       General: Alert, oriented x3, no distress Head: no evidence of trauma, PERRL, EOMI, no exophtalmos or lid lag, no myxedema, no xanthelasma; normal ears, nose and oropharynx Neck: normal jugular venous pulsations and no hepatojugular reflux; brisk carotid pulses without delay and no carotid bruits Chest: clear to auscultation, no signs of consolidation by percussion or palpation, normal fremitus, symmetrical and full respiratory excursions Cardiovascular: normal position and quality of the apical impulse, regular rhythm, normal first and second heart sounds, no rubs or gallops, no murmur Abdomen: no tenderness or distention, no masses by palpation, no abnormal pulsatility or arterial bruits, normal bowel sounds, no hepatosplenomegaly Extremities: no clubbing, cyanosis or edema; 2+ radial, ulnar and brachial pulses bilaterally; 2+ right  femoral, posterior tibial and dorsalis pedis pulses; 2+ left femoral, posterior tibial and dorsalis pedis pulses; no subclavian or femoral bruits Neurological: grossly nonfocal  LABS  CBC  Recent Labs  01/10/16 0255  WBC 8.4  HGB 13.4  HCT 40.3  MCV 86.7  PLT 218   Basic Metabolic Panel  Recent Labs  01/10/16 0255  NA 138  K 3.9  CL 105  CO2 26  GLUCOSE 149*  BUN 15  CREATININE 0.83  CALCIUM 8.8*    TELE NSR  ASSESSMENT AND PLAN  No angina at rest. For CABG in AM.   Thurmon Fair, MD, [facility] [phone] office [phone] pager 01/12/2016 8:29 AM

## 2016-01-12 NOTE — Progress Notes (Signed)
5 Days Post-Op Procedure(s) (LRB): Left Heart Cath and Coronary Angiography (N/A) Subjective: No problems overnight  Objective: Vital signs in last 24 hours: Temp:  [98.2 F (36.8 C)-98.6 F (37 C)] 98.6 F (37 C) (07/24 1937) Pulse Rate:  [64-100] 75 (07/25 0700) Cardiac Rhythm: Normal sinus rhythm (07/25 0700) Resp:  [11-27] 13 (07/25 0700) BP: (103-147)/(38-95) 133/80 (07/25 0700) SpO2:  [90 %-100 %] 98 % (07/25 0700)  Hemodynamic parameters for last 24 hours:    Intake/Output from previous day: 07/24 0701 - 07/25 0700 In: 1180 [P.O.:1180] Out: 2000 [Urine:2000] Intake/Output this shift: No intake/output data recorded.  General appearance: alert, cooperative and no distress Neurologic: intact Heart: regular rate and rhythm Lungs: clear to auscultation bilaterally  Lab Results:  Recent Labs  01/10/16 0255  WBC 8.4  HGB 13.4  HCT 40.3  PLT 218   BMET:  Recent Labs  01/10/16 0255  NA 138  K 3.9  CL 105  CO2 26  GLUCOSE 149*  BUN 15  CREATININE 0.83  CALCIUM 8.8*    PT/INR: No results for input(s): LABPROT, INR in the last 72 hours. ABG    Component Value Date/Time   PHART 7.394 01/08/2016 0412   HCO3 23.8 01/08/2016 0412   TCO2 25 01/08/2016 0412   ACIDBASEDEF 1.0 01/08/2016 0412   O2SAT 96.0 01/08/2016 0412   CBG (last 3)  No results for input(s): GLUCAP in the last 72 hours.  Assessment/Plan: S/P Procedure(s) (LRB): Left Heart Cath and Coronary Angiography (N/A) -  FOR CABG in AM, all questions answered   LOS: 0 days    Loreli Slot 01/12/2016

## 2016-01-12 NOTE — Progress Notes (Signed)
Patient and wife viewed both videos Preparing for Open Heart Surgery and Recovering from Open Heart Surgery.  Questions answered.  Support given.  Consents discussed with patient and questions answered.  Consents signed for surgery.

## 2016-01-12 NOTE — Anesthesia Preprocedure Evaluation (Addendum)
Anesthesia Evaluation  Patient identified by MRN, date of birth, ID band Patient awake    Reviewed: Allergy & Precautions, NPO status , Patient's Chart, lab work & pertinent test results, reviewed documented beta blocker date and time   Airway Mallampati: III  TM Distance: >3 FB Neck ROM: Full    Dental  (+) Dental Advisory Given, Poor Dentition, Missing   Pulmonary former smoker,    breath sounds clear to auscultation       Cardiovascular hypertension, Pt. on medications and Pt. on home beta blockers + angina  Rhythm:Regular Rate:Normal     Neuro/Psych PSYCHIATRIC DISORDERS negative neurological ROS     GI/Hepatic negative GI ROS, Neg liver ROS,   Endo/Other  negative endocrine ROS  Renal/GU Renal InsufficiencyRenal diseasenegative Renal ROS  negative genitourinary   Musculoskeletal negative musculoskeletal ROS (+)   Abdominal   Peds negative pediatric ROS (+)  Hematology negative hematology ROS (+)   Anesthesia Other Findings   Reproductive/Obstetrics negative OB ROS                           Lab Results  Component Value Date   WBC 8.4 01/10/2016   HGB 13.4 01/10/2016   HCT 40.3 01/10/2016   MCV 86.7 01/10/2016   PLT 218 01/10/2016   Lab Results  Component Value Date   CREATININE 0.83 01/10/2016   BUN 15 01/10/2016   NA 138 01/10/2016   K 3.9 01/10/2016   CL 105 01/10/2016   CO2 26 01/10/2016   Lab Results  Component Value Date   INR 1.07 01/12/2016   INR 1.0 01/06/2016   12/2015 EKG: normal sinus rhythm.   Anesthesia Physical Anesthesia Plan  ASA: IV  Anesthesia Plan: General   Post-op Pain Management:    Induction: Intravenous  Airway Management Planned: Oral ETT  Additional Equipment: Arterial line, CVP, PA Cath, TEE and Ultrasound Guidance Line Placement  Intra-op Plan:   Post-operative Plan: Post-operative intubation/ventilation  Informed Consent: I  have reviewed the patients History and Physical, chart, labs and discussed the procedure including the risks, benefits and alternatives for the proposed anesthesia with the patient or authorized representative who has indicated his/her understanding and acceptance.   Dental advisory given  Plan Discussed with: CRNA  Anesthesia Plan Comments:         Anesthesia Quick Evaluation

## 2016-01-13 ENCOUNTER — Inpatient Hospital Stay (HOSPITAL_COMMUNITY): Payer: BLUE CROSS/BLUE SHIELD

## 2016-01-13 ENCOUNTER — Encounter (HOSPITAL_COMMUNITY)
Admission: AD | Disposition: A | Discharge status: Home / Self Care | Payer: Self-pay | Source: Ambulatory Visit | Attending: Interventional Cardiology

## 2016-01-13 ENCOUNTER — Inpatient Hospital Stay (HOSPITAL_COMMUNITY): Payer: BLUE CROSS/BLUE SHIELD | Admitting: Anesthesiology

## 2016-01-13 DIAGNOSIS — Z951 Presence of aortocoronary bypass graft: Secondary | ICD-10-CM

## 2016-01-13 HISTORY — DX: Presence of aortocoronary bypass graft: Z95.1

## 2016-01-13 HISTORY — PX: CORONARY ARTERY BYPASS GRAFT: SHX141

## 2016-01-13 HISTORY — PX: TEE WITHOUT CARDIOVERSION: SHX5443

## 2016-01-13 LAB — BASIC METABOLIC PANEL
Anion gap: 12 (ref 5–15)
BUN: 18 mg/dL (ref 6–20)
CO2: 22 mmol/L (ref 22–32)
Calcium: 9.3 mg/dL (ref 8.9–10.3)
Chloride: 104 mmol/L (ref 101–111)
Creatinine, Ser: 0.89 mg/dL (ref 0.61–1.24)
GFR calc Af Amer: >60 mL/min (ref >60)
GFR calc non Af Amer: >60 mL/min (ref >60)
Glucose, Bld: 152 mg/dL — ABNORMAL HIGH (ref 65–99)
Potassium: 4.1 mmol/L (ref 3.5–5.1)
Sodium: 138 mmol/L (ref 135–145)

## 2016-01-13 LAB — POCT I-STAT 3, ART BLOOD GAS (G3+)
Acid-Base Excess: 1 mmol/L (ref 0.0–2.0)
Acid-base deficit: 2 mmol/L (ref 0.0–2.0)
Acid-base deficit: 3 mmol/L — ABNORMAL HIGH (ref 0.0–2.0)
Acid-base deficit: 2 mmol/L (ref 0.0–2.0)
Acid-base deficit: 3 mmol/L — ABNORMAL HIGH (ref 0.0–2.0)
Acid-base deficit: 3 mmol/L — ABNORMAL HIGH (ref 0.0–2.0)
Bicarbonate: 27 mEq/L — ABNORMAL HIGH (ref 20.0–24.0)
Bicarbonate: 23.7 mEq/L (ref 20.0–24.0)
Bicarbonate: 24.3 mEq/L — ABNORMAL HIGH (ref 20.0–24.0)
Bicarbonate: 24.4 mEq/L — ABNORMAL HIGH (ref 20.0–24.0)
Bicarbonate: 22.9 mEq/L (ref 20.0–24.0)
Bicarbonate: 23.3 mEq/L (ref 20.0–24.0)
O2 Saturation: 100 %
O2 Saturation: 100 %
O2 Saturation: 99 %
O2 Saturation: 99 %
O2 Saturation: 99 %
O2 Saturation: 99 %
Patient temperature: 36.4
Patient temperature: 36.3
Patient temperature: 36.9
Patient temperature: 37.5
TCO2: 28 mmol/L (ref 0–100)
TCO2: 25 mmol/L (ref 0–100)
TCO2: 26 mmol/L (ref 0–100)
TCO2: 26 mmol/L (ref 0–100)
TCO2: 24 mmol/L (ref 0–100)
TCO2: 25 mmol/L (ref 0–100)
pCO2 arterial: 46.8 mmHg — ABNORMAL HIGH (ref 35.0–45.0)
pCO2 arterial: 45.7 mmHg — ABNORMAL HIGH (ref 35.0–45.0)
pCO2 arterial: 49.9 mmHg — ABNORMAL HIGH (ref 35.0–45.0)
pCO2 arterial: 48.3 mmHg — ABNORMAL HIGH (ref 35.0–45.0)
pCO2 arterial: 43 mmHg (ref 35.0–45.0)
pCO2 arterial: 45 mmHg (ref 35.0–45.0)
pH, Arterial: 7.368 (ref 7.350–7.450)
pH, Arterial: 7.324 — ABNORMAL LOW (ref 7.350–7.450)
pH, Arterial: 7.292 — ABNORMAL LOW (ref 7.350–7.450)
pH, Arterial: 7.309 — ABNORMAL LOW (ref 7.350–7.450)
pH, Arterial: 7.334 — ABNORMAL LOW (ref 7.350–7.450)
pH, Arterial: 7.324 — ABNORMAL LOW (ref 7.350–7.450)
pO2, Arterial: 345 mmHg — ABNORMAL HIGH (ref 80.0–100.0)
pO2, Arterial: 218 mmHg — ABNORMAL HIGH (ref 80.0–100.0)
pO2, Arterial: 157 mmHg — ABNORMAL HIGH (ref 80.0–100.0)
pO2, Arterial: 182 mmHg — ABNORMAL HIGH (ref 80.0–100.0)
pO2, Arterial: 135 mmHg — ABNORMAL HIGH (ref 80.0–100.0)
pO2, Arterial: 133 mmHg — ABNORMAL HIGH (ref 80.0–100.0)

## 2016-01-13 LAB — POCT I-STAT, CHEM 8
BUN: 20 mg/dL (ref 6–20)
BUN: 17 mg/dL (ref 6–20)
BUN: 20 mg/dL (ref 6–20)
BUN: 17 mg/dL (ref 6–20)
BUN: 17 mg/dL (ref 6–20)
BUN: 15 mg/dL (ref 6–20)
Calcium, Ion: 1.25 mmol/L (ref 1.13–1.30)
Calcium, Ion: 1.05 mmol/L — ABNORMAL LOW (ref 1.13–1.30)
Calcium, Ion: 1.2 mmol/L (ref 1.13–1.30)
Calcium, Ion: 1.03 mmol/L — ABNORMAL LOW (ref 1.13–1.30)
Calcium, Ion: 1.03 mmol/L — ABNORMAL LOW (ref 1.13–1.30)
Calcium, Ion: 1.06 mmol/L — ABNORMAL LOW (ref 1.13–1.30)
Chloride: 101 mmol/L (ref 101–111)
Chloride: 101 mmol/L (ref 101–111)
Chloride: 101 mmol/L (ref 101–111)
Chloride: 101 mmol/L (ref 101–111)
Chloride: 99 mmol/L — ABNORMAL LOW (ref 101–111)
Chloride: 101 mmol/L (ref 101–111)
Creatinine, Ser: 0.7 mg/dL (ref 0.61–1.24)
Creatinine, Ser: 0.5 mg/dL — ABNORMAL LOW (ref 0.61–1.24)
Creatinine, Ser: 0.7 mg/dL (ref 0.61–1.24)
Creatinine, Ser: 0.6 mg/dL — ABNORMAL LOW (ref 0.61–1.24)
Creatinine, Ser: 0.6 mg/dL — ABNORMAL LOW (ref 0.61–1.24)
Creatinine, Ser: 0.7 mg/dL (ref 0.61–1.24)
Glucose, Bld: 143 mg/dL — ABNORMAL HIGH (ref 65–99)
Glucose, Bld: 164 mg/dL — ABNORMAL HIGH (ref 65–99)
Glucose, Bld: 171 mg/dL — ABNORMAL HIGH (ref 65–99)
Glucose, Bld: 212 mg/dL — ABNORMAL HIGH (ref 65–99)
Glucose, Bld: 218 mg/dL — ABNORMAL HIGH (ref 65–99)
Glucose, Bld: 163 mg/dL — ABNORMAL HIGH (ref 65–99)
HCT: 40 % (ref 39.0–52.0)
HCT: 32 % — ABNORMAL LOW (ref 39.0–52.0)
HCT: 38 % — ABNORMAL LOW (ref 39.0–52.0)
HCT: 28 % — ABNORMAL LOW (ref 39.0–52.0)
HCT: 30 % — ABNORMAL LOW (ref 39.0–52.0)
HCT: 34 % — ABNORMAL LOW (ref 39.0–52.0)
Hemoglobin: 13.6 g/dL (ref 13.0–17.0)
Hemoglobin: 10.9 g/dL — ABNORMAL LOW (ref 13.0–17.0)
Hemoglobin: 12.9 g/dL — ABNORMAL LOW (ref 13.0–17.0)
Hemoglobin: 10.2 g/dL — ABNORMAL LOW (ref 13.0–17.0)
Hemoglobin: 9.5 g/dL — ABNORMAL LOW (ref 13.0–17.0)
Hemoglobin: 11.6 g/dL — ABNORMAL LOW (ref 13.0–17.0)
Potassium: 4.2 mmol/L (ref 3.5–5.1)
Potassium: 4 mmol/L (ref 3.5–5.1)
Potassium: 4.7 mmol/L (ref 3.5–5.1)
Potassium: 4.7 mmol/L (ref 3.5–5.1)
Potassium: 5.5 mmol/L — ABNORMAL HIGH (ref 3.5–5.1)
Potassium: 4.6 mmol/L (ref 3.5–5.1)
Sodium: 140 mmol/L (ref 135–145)
Sodium: 138 mmol/L (ref 135–145)
Sodium: 138 mmol/L (ref 135–145)
Sodium: 134 mmol/L — ABNORMAL LOW (ref 135–145)
Sodium: 137 mmol/L (ref 135–145)
Sodium: 137 mmol/L (ref 135–145)
TCO2: 29 mmol/L (ref 0–100)
TCO2: 28 mmol/L (ref 0–100)
TCO2: 28 mmol/L (ref 0–100)
TCO2: 25 mmol/L (ref 0–100)
TCO2: 28 mmol/L (ref 0–100)
TCO2: 24 mmol/L (ref 0–100)

## 2016-01-13 LAB — CBC
HCT: 43.8 % (ref 39.0–52.0)
HCT: 38.2 % — ABNORMAL LOW (ref 39.0–52.0)
HCT: 32.9 % — ABNORMAL LOW (ref 39.0–52.0)
Hemoglobin: 14.3 g/dL (ref 13.0–17.0)
Hemoglobin: 12.5 g/dL — ABNORMAL LOW (ref 13.0–17.0)
Hemoglobin: 11 g/dL — ABNORMAL LOW (ref 13.0–17.0)
MCH: 28.5 pg (ref 26.0–34.0)
MCH: 28.5 pg (ref 26.0–34.0)
MCH: 28.9 pg (ref 26.0–34.0)
MCHC: 32.6 g/dL (ref 30.0–36.0)
MCHC: 32.7 g/dL (ref 30.0–36.0)
MCHC: 33.4 g/dL (ref 30.0–36.0)
MCV: 87.4 fL (ref 78.0–100.0)
MCV: 87.2 fL (ref 78.0–100.0)
MCV: 86.6 fL (ref 78.0–100.0)
Platelets: 236 10*3/uL (ref 150–400)
Platelets: 189 10*3/uL (ref 150–400)
Platelets: 168 10*3/uL (ref 150–400)
RBC: 5.01 MIL/uL (ref 4.22–5.81)
RBC: 4.38 MIL/uL (ref 4.22–5.81)
RBC: 3.8 MIL/uL — ABNORMAL LOW (ref 4.22–5.81)
RDW: 13.6 % (ref 11.5–15.5)
RDW: 13.3 % (ref 11.5–15.5)
RDW: 13.4 % (ref 11.5–15.5)
WBC: 9.7 10*3/uL (ref 4.0–10.5)
WBC: 17.7 10*3/uL — ABNORMAL HIGH (ref 4.0–10.5)
WBC: 13.9 10*3/uL — ABNORMAL HIGH (ref 4.0–10.5)

## 2016-01-13 LAB — APTT
aPTT: 32 seconds (ref 24–36)
aPTT: 32 seconds (ref 24–36)

## 2016-01-13 LAB — HEMOGLOBIN AND HEMATOCRIT, BLOOD
HCT: 31.2 % — ABNORMAL LOW (ref 39.0–52.0)
Hemoglobin: 10.4 g/dL — ABNORMAL LOW (ref 13.0–17.0)

## 2016-01-13 LAB — POCT ACTIVATED CLOTTING TIME
Activated Clotting Time: 120 seconds
Activated Clotting Time: 560 seconds
Activated Clotting Time: 445 seconds
Activated Clotting Time: 400 seconds

## 2016-01-13 LAB — CREATININE, SERUM
Creatinine, Ser: 0.85 mg/dL (ref 0.61–1.24)
GFR calc Af Amer: >60 mL/min (ref >60)
GFR calc non Af Amer: >60 mL/min (ref >60)

## 2016-01-13 LAB — GLUCOSE, CAPILLARY
Glucose-Capillary: 146 mg/dL — ABNORMAL HIGH (ref 65–99)
Glucose-Capillary: 122 mg/dL — ABNORMAL HIGH (ref 65–99)
Glucose-Capillary: 119 mg/dL — ABNORMAL HIGH (ref 65–99)
Glucose-Capillary: 118 mg/dL — ABNORMAL HIGH (ref 65–99)
Glucose-Capillary: 128 mg/dL — ABNORMAL HIGH (ref 65–99)
Glucose-Capillary: 132 mg/dL — ABNORMAL HIGH (ref 65–99)
Glucose-Capillary: 132 mg/dL — ABNORMAL HIGH (ref 65–99)
Glucose-Capillary: 118 mg/dL — ABNORMAL HIGH (ref 65–99)

## 2016-01-13 LAB — MAGNESIUM: Magnesium: 2.8 mg/dL — ABNORMAL HIGH (ref 1.7–2.4)

## 2016-01-13 LAB — POCT I-STAT 4, (NA,K, GLUC, HGB,HCT)
Glucose, Bld: 172 mg/dL — ABNORMAL HIGH (ref 65–99)
HCT: 38 % — ABNORMAL LOW (ref 39.0–52.0)
Hemoglobin: 12.9 g/dL — ABNORMAL LOW (ref 13.0–17.0)
Potassium: 4.5 mmol/L (ref 3.5–5.1)
Sodium: 137 mmol/L (ref 135–145)

## 2016-01-13 LAB — PROTIME-INR
INR: 1.32
Prothrombin Time: 16.5 seconds — ABNORMAL HIGH (ref 11.4–15.2)

## 2016-01-13 LAB — PLATELET COUNT: Platelets: 208 10*3/uL (ref 150–400)

## 2016-01-13 SURGERY — CORONARY ARTERY BYPASS GRAFTING (CABG)
Anesthesia: General | Site: Chest

## 2016-01-13 MED ORDER — MORPHINE SULFATE (PF) 2 MG/ML IV SOLN
2.0000 mg | INTRAVENOUS | Status: DC | PRN
  Administered 2016-01-13: 2 mg via INTRAVENOUS
  Administered 2016-01-13: 4 mg via INTRAVENOUS
  Administered 2016-01-14: 2 mg via INTRAVENOUS
  Administered 2016-01-14 (×3): 4 mg via INTRAVENOUS
  Filled 2016-01-13: qty 2
  Filled 2016-01-13: qty 1
  Filled 2016-01-13 (×3): qty 2
  Filled 2016-01-13: qty 1
  Filled 2016-01-13: qty 2

## 2016-01-13 MED ORDER — BISACODYL 10 MG RE SUPP
10.0000 mg | Freq: Every day | RECTAL | Status: DC
Start: 2016-01-14 — End: 2016-01-18

## 2016-01-13 MED ORDER — MIDAZOLAM HCL 2 MG/2ML IJ SOLN: 2.0000 mg | INTRAMUSCULAR | Status: DC | PRN

## 2016-01-13 MED ORDER — ALBUMIN HUMAN 5 % IV SOLN
250.0000 mL | INTRAVENOUS | Status: AC | PRN
  Administered 2016-01-13 (×3): 250 mL via INTRAVENOUS
  Filled 2016-01-13: qty 250

## 2016-01-13 MED ORDER — PROPOFOL 10 MG/ML IV BOLUS
INTRAVENOUS | Status: AC
  Filled 2016-01-13: qty 20

## 2016-01-13 MED ORDER — DOCUSATE SODIUM 100 MG PO CAPS
200.0000 mg | ORAL_CAPSULE | Freq: Every day | ORAL | Status: DC
Start: 2016-01-14 — End: 2016-01-18
  Administered 2016-01-14 – 2016-01-16 (×3): 200 mg via ORAL
  Filled 2016-01-13 (×5): qty 2

## 2016-01-13 MED ORDER — METOPROLOL TARTRATE 12.5 MG HALF TABLET
12.5000 mg | ORAL_TABLET | Freq: Two times a day (BID) | ORAL | Status: DC
  Administered 2016-01-14 (×2): 12.5 mg via ORAL
  Filled 2016-01-13 (×2): qty 1

## 2016-01-13 MED ORDER — LACTATED RINGERS IV SOLN: INTRAVENOUS | Status: DC

## 2016-01-13 MED ORDER — ACETAMINOPHEN 160 MG/5ML PO SOLN: 650.0000 mg | Freq: Once | ORAL | Status: AC

## 2016-01-13 MED ORDER — DEXTROSE 5 % IV SOLN
1.5000 g | Freq: Two times a day (BID) | INTRAVENOUS | Status: AC
  Administered 2016-01-13 – 2016-01-15 (×4): 1.5 g via INTRAVENOUS
  Filled 2016-01-13 (×4): qty 1.5

## 2016-01-13 MED ORDER — TRAMADOL HCL 50 MG PO TABS
50.0000 mg | ORAL_TABLET | ORAL | Status: DC | PRN
  Administered 2016-01-14: 50 mg via ORAL
  Filled 2016-01-13: qty 1

## 2016-01-13 MED ORDER — VANCOMYCIN HCL IN DEXTROSE 1-5 GM/200ML-% IV SOLN
1000.0000 mg | Freq: Once | INTRAVENOUS | Status: AC
  Administered 2016-01-13: 1000 mg via INTRAVENOUS
  Filled 2016-01-13: qty 200

## 2016-01-13 MED ORDER — PROPOFOL 10 MG/ML IV BOLUS
INTRAVENOUS | Status: DC | PRN
  Administered 2016-01-13: 200 mg via INTRAVENOUS

## 2016-01-13 MED ORDER — LACTATED RINGERS IV SOLN
INTRAVENOUS | Status: DC | PRN
  Administered 2016-01-13: 08:00:00 via INTRAVENOUS

## 2016-01-13 MED ORDER — VECURONIUM BROMIDE 10 MG IV SOLR
INTRAVENOUS | Status: AC
  Filled 2016-01-13: qty 20

## 2016-01-13 MED ORDER — 0.9 % SODIUM CHLORIDE (POUR BTL) OPTIME
TOPICAL | Status: DC | PRN
  Administered 2016-01-13: 6000 mL

## 2016-01-13 MED ORDER — ALBUMIN HUMAN 5 % IV SOLN
INTRAVENOUS | Status: DC | PRN
  Administered 2016-01-13: 13:00:00 via INTRAVENOUS

## 2016-01-13 MED ORDER — INSULIN REGULAR BOLUS VIA INFUSION
0.0000 [IU] | Freq: Three times a day (TID) | INTRAVENOUS | Status: DC
  Filled 2016-01-13: qty 10

## 2016-01-13 MED ORDER — BISACODYL 5 MG PO TBEC
10.0000 mg | DELAYED_RELEASE_TABLET | Freq: Every day | ORAL | Status: DC
Start: 2016-01-14 — End: 2016-01-18
  Administered 2016-01-14 – 2016-01-16 (×3): 10 mg via ORAL
  Filled 2016-01-13 (×5): qty 2

## 2016-01-13 MED ORDER — LACTATED RINGERS IV SOLN
500.0000 mL | Freq: Once | INTRAVENOUS | Status: DC | PRN
Start: 2016-01-13 — End: 2016-01-15

## 2016-01-13 MED ORDER — MIDAZOLAM HCL 2 MG/2ML IJ SOLN
INTRAMUSCULAR | Status: AC
  Filled 2016-01-13: qty 2

## 2016-01-13 MED ORDER — HEPARIN SODIUM (PORCINE) 1000 UNIT/ML IJ SOLN
INTRAMUSCULAR | Status: AC
  Filled 2016-01-13: qty 2

## 2016-01-13 MED ORDER — FENTANYL CITRATE (PF) 250 MCG/5ML IJ SOLN
INTRAMUSCULAR | Status: AC
  Filled 2016-01-13: qty 25

## 2016-01-13 MED ORDER — OXYCODONE HCL 5 MG PO TABS
5.0000 mg | ORAL_TABLET | ORAL | Status: DC | PRN
  Administered 2016-01-14 – 2016-01-16 (×8): 10 mg via ORAL
  Filled 2016-01-13 (×9): qty 2

## 2016-01-13 MED ORDER — NITROGLYCERIN IN D5W 200-5 MCG/ML-% IV SOLN: 0.0000 ug/min | INTRAVENOUS | Status: DC

## 2016-01-13 MED ORDER — PROTAMINE SULFATE 10 MG/ML IV SOLN
INTRAVENOUS | Status: DC | PRN
  Administered 2016-01-13: 400 mg via INTRAVENOUS

## 2016-01-13 MED ORDER — FAMOTIDINE IN NACL 20-0.9 MG/50ML-% IV SOLN
20.0000 mg | Freq: Two times a day (BID) | INTRAVENOUS | Status: AC
  Administered 2016-01-13: 20 mg via INTRAVENOUS

## 2016-01-13 MED ORDER — VECURONIUM BROMIDE 10 MG IV SOLR
INTRAVENOUS | Status: DC | PRN
  Administered 2016-01-13: 3 mg via INTRAVENOUS
  Administered 2016-01-13: 4 mg via INTRAVENOUS
  Administered 2016-01-13: 3 mg via INTRAVENOUS
  Administered 2016-01-13: 4 mg via INTRAVENOUS
  Administered 2016-01-13: 5 mg via INTRAVENOUS

## 2016-01-13 MED ORDER — SODIUM CHLORIDE 0.9% FLUSH: 3.0000 mL | INTRAVENOUS | Status: DC | PRN

## 2016-01-13 MED ORDER — CLOPIDOGREL BISULFATE 75 MG PO TABS
75.0000 mg | ORAL_TABLET | Freq: Every day | ORAL | Status: DC
  Administered 2016-01-14 – 2016-01-18 (×5): 75 mg via ORAL
  Filled 2016-01-13 (×5): qty 1

## 2016-01-13 MED ORDER — DEXMEDETOMIDINE HCL IN NACL 200 MCG/50ML IV SOLN
0.0000 ug/kg/h | INTRAVENOUS | Status: DC
  Administered 2016-01-13: 0.4 ug/kg/h via INTRAVENOUS
  Filled 2016-01-13 (×2): qty 50

## 2016-01-13 MED ORDER — FENTANYL CITRATE (PF) 250 MCG/5ML IJ SOLN
INTRAMUSCULAR | Status: AC
  Filled 2016-01-13: qty 5

## 2016-01-13 MED ORDER — CHLORHEXIDINE GLUCONATE 0.12 % MT SOLN
15.0000 mL | Freq: Two times a day (BID) | OROMUCOSAL | Status: DC
  Administered 2016-01-14 – 2016-01-15 (×3): 15 mL via OROMUCOSAL
  Filled 2016-01-13 (×3): qty 15

## 2016-01-13 MED ORDER — CALCIUM CHLORIDE 10 % IV SOLN
INTRAVENOUS | Status: AC
  Filled 2016-01-13: qty 10

## 2016-01-13 MED ORDER — HEMOSTATIC AGENTS (NO CHARGE) OPTIME
TOPICAL | Status: DC | PRN
  Administered 2016-01-13: 1 via TOPICAL

## 2016-01-13 MED ORDER — SODIUM CHLORIDE 0.9 % IJ SOLN
OROMUCOSAL | Status: DC | PRN
  Administered 2016-01-13 (×3): 4 mL via TOPICAL

## 2016-01-13 MED ORDER — SODIUM CHLORIDE 0.45 % IV SOLN
INTRAVENOUS | Status: DC | PRN
  Administered 2016-01-13: 14:00:00 via INTRAVENOUS

## 2016-01-13 MED ORDER — PHENYLEPHRINE 40 MCG/ML (10ML) SYRINGE FOR IV PUSH (FOR BLOOD PRESSURE SUPPORT)
PREFILLED_SYRINGE | INTRAVENOUS | Status: AC
  Filled 2016-01-13: qty 10

## 2016-01-13 MED ORDER — ONDANSETRON HCL 4 MG/2ML IJ SOLN
4.0000 mg | Freq: Four times a day (QID) | INTRAMUSCULAR | Status: DC | PRN
  Administered 2016-01-14 – 2016-01-18 (×3): 4 mg via INTRAVENOUS
  Filled 2016-01-13 (×3): qty 2

## 2016-01-13 MED ORDER — DEXTROSE 5 % IV SOLN
0.0000 ug/min | INTRAVENOUS | Status: DC
  Administered 2016-01-13: 10 ug/min via INTRAVENOUS
  Filled 2016-01-13 (×2): qty 2

## 2016-01-13 MED ORDER — METOPROLOL TARTRATE 5 MG/5ML IV SOLN: 2.5000 mg | INTRAVENOUS | Status: DC | PRN

## 2016-01-13 MED ORDER — SODIUM CHLORIDE 0.9 % IV SOLN: 250.0000 mL | INTRAVENOUS | Status: DC

## 2016-01-13 MED ORDER — ACETAMINOPHEN 500 MG PO TABS
1000.0000 mg | ORAL_TABLET | Freq: Four times a day (QID) | ORAL | Status: DC
  Administered 2016-01-14 – 2016-01-18 (×11): 1000 mg via ORAL
  Filled 2016-01-13 (×12): qty 2

## 2016-01-13 MED ORDER — SODIUM CHLORIDE 0.9% FLUSH
3.0000 mL | Freq: Two times a day (BID) | INTRAVENOUS | Status: DC
  Administered 2016-01-14 (×2): 3 mL via INTRAVENOUS

## 2016-01-13 MED ORDER — ACETAMINOPHEN 160 MG/5ML PO SOLN: 1000.0000 mg | Freq: Four times a day (QID) | ORAL | Status: DC

## 2016-01-13 MED ORDER — CHLORHEXIDINE GLUCONATE 0.12 % MT SOLN
15.0000 mL | OROMUCOSAL | Status: AC
  Administered 2016-01-13: 15 mL via OROMUCOSAL

## 2016-01-13 MED ORDER — PANTOPRAZOLE SODIUM 40 MG PO TBEC
40.0000 mg | DELAYED_RELEASE_TABLET | Freq: Every day | ORAL | Status: DC
  Administered 2016-01-15 – 2016-01-18 (×4): 40 mg via ORAL
  Filled 2016-01-13 (×4): qty 1

## 2016-01-13 MED ORDER — POTASSIUM CHLORIDE 10 MEQ/50ML IV SOLN: 10.0000 meq | INTRAVENOUS | Status: AC

## 2016-01-13 MED ORDER — ROCURONIUM BROMIDE 100 MG/10ML IV SOLN
INTRAVENOUS | Status: DC | PRN
  Administered 2016-01-13: 100 mg via INTRAVENOUS

## 2016-01-13 MED ORDER — MIDAZOLAM HCL 10 MG/2ML IJ SOLN
INTRAMUSCULAR | Status: AC
  Filled 2016-01-13: qty 2

## 2016-01-13 MED ORDER — ACETAMINOPHEN 650 MG RE SUPP
650.0000 mg | Freq: Once | RECTAL | Status: AC
  Administered 2016-01-13: 650 mg via RECTAL

## 2016-01-13 MED ORDER — CHLORHEXIDINE GLUCONATE 0.12% ORAL RINSE (MEDLINE KIT)
15.0000 mL | Freq: Two times a day (BID) | OROMUCOSAL | Status: DC
  Administered 2016-01-13: 15 mL via OROMUCOSAL

## 2016-01-13 MED ORDER — HEPARIN SODIUM (PORCINE) 1000 UNIT/ML IJ SOLN
INTRAMUSCULAR | Status: AC
  Filled 2016-01-13: qty 1

## 2016-01-13 MED ORDER — METOPROLOL TARTRATE 25 MG/10 ML ORAL SUSPENSION
12.5000 mg | Freq: Two times a day (BID) | ORAL | Status: DC
  Filled 2016-01-13: qty 5

## 2016-01-13 MED ORDER — CETYLPYRIDINIUM CHLORIDE 0.05 % MT LIQD
7.0000 mL | Freq: Two times a day (BID) | OROMUCOSAL | Status: DC
  Administered 2016-01-14: 7 mL via OROMUCOSAL

## 2016-01-13 MED ORDER — SODIUM CHLORIDE 0.9 % IV SOLN
INTRAVENOUS | Status: DC
  Administered 2016-01-13: 14:00:00 via INTRAVENOUS

## 2016-01-13 MED ORDER — ROCURONIUM BROMIDE 50 MG/5ML IV SOLN
INTRAVENOUS | Status: AC
  Filled 2016-01-13: qty 2

## 2016-01-13 MED ORDER — PROTAMINE SULFATE 10 MG/ML IV SOLN
INTRAVENOUS | Status: AC
  Filled 2016-01-13: qty 50

## 2016-01-13 MED ORDER — ARTIFICIAL TEARS OP OINT
TOPICAL_OINTMENT | OPHTHALMIC | Status: DC | PRN
  Administered 2016-01-13: 1 via OPHTHALMIC

## 2016-01-13 MED ORDER — MORPHINE SULFATE (PF) 2 MG/ML IV SOLN: 1.0000 mg | INTRAVENOUS | Status: AC | PRN

## 2016-01-13 MED ORDER — MAGNESIUM SULFATE 4 GM/100ML IV SOLN
4.0000 g | Freq: Once | INTRAVENOUS | Status: AC
  Administered 2016-01-13: 4 g via INTRAVENOUS
  Filled 2016-01-13: qty 100

## 2016-01-13 MED ORDER — HEPARIN SODIUM (PORCINE) 1000 UNIT/ML IJ SOLN
INTRAMUSCULAR | Status: DC | PRN
  Administered 2016-01-13: 2000 [IU] via INTRAVENOUS
  Administered 2016-01-13: 45000 [IU] via INTRAVENOUS

## 2016-01-13 MED ORDER — ANTISEPTIC ORAL RINSE SOLUTION (CORINZ)
7.0000 mL | OROMUCOSAL | Status: DC
  Administered 2016-01-13 (×2): 7 mL via OROMUCOSAL

## 2016-01-13 MED ORDER — MIDAZOLAM HCL 5 MG/5ML IJ SOLN
INTRAMUSCULAR | Status: DC | PRN
  Administered 2016-01-13: 3 mg via INTRAVENOUS
  Administered 2016-01-13: 4 mg via INTRAVENOUS
  Administered 2016-01-13: 2 mg via INTRAVENOUS
  Administered 2016-01-13: 1 mg via INTRAVENOUS
  Administered 2016-01-13: 2 mg via INTRAVENOUS

## 2016-01-13 MED ORDER — SODIUM CHLORIDE 0.9 % IV SOLN
INTRAVENOUS | Status: DC
  Administered 2016-01-13: 4.3 [IU]/h via INTRAVENOUS
  Filled 2016-01-13 (×2): qty 2.5

## 2016-01-13 MED ORDER — FENTANYL CITRATE (PF) 100 MCG/2ML IJ SOLN
INTRAMUSCULAR | Status: DC | PRN
  Administered 2016-01-13: 150 ug via INTRAVENOUS
  Administered 2016-01-13: 50 ug via INTRAVENOUS
  Administered 2016-01-13: 100 ug via INTRAVENOUS
  Administered 2016-01-13: 200 ug via INTRAVENOUS
  Administered 2016-01-13: 150 ug via INTRAVENOUS
  Administered 2016-01-13: 50 ug via INTRAVENOUS
  Administered 2016-01-13: 150 ug via INTRAVENOUS
  Administered 2016-01-13: 200 ug via INTRAVENOUS
  Administered 2016-01-13 (×2): 100 ug via INTRAVENOUS

## 2016-01-13 MED FILL — Heparin Sodium (Porcine) Inj 1000 Unit/ML: INTRAMUSCULAR | Qty: 30 | Status: AC

## 2016-01-13 MED FILL — Sodium Chloride IV Soln 0.9%: INTRAVENOUS | Qty: 2000 | Status: AC

## 2016-01-13 MED FILL — Potassium Chloride Inj 2 mEq/ML: INTRAVENOUS | Qty: 40 | Status: AC

## 2016-01-13 MED FILL — Heparin Sodium (Porcine) Inj 1000 Unit/ML: INTRAMUSCULAR | Qty: 10 | Status: AC

## 2016-01-13 MED FILL — Lidocaine HCl IV Inj 20 MG/ML: INTRAVENOUS | Qty: 5 | Status: AC

## 2016-01-13 MED FILL — Sodium Bicarbonate IV Soln 8.4%: INTRAVENOUS | Qty: 50 | Status: AC

## 2016-01-13 MED FILL — Mannitol IV Soln 20%: INTRAVENOUS | Qty: 500 | Status: AC

## 2016-01-13 MED FILL — Magnesium Sulfate Inj 50%: INTRAMUSCULAR | Qty: 10 | Status: AC

## 2016-01-13 MED FILL — Electrolyte-R (PH 7.4) Solution: INTRAVENOUS | Qty: 4000 | Status: AC

## 2016-01-13 SURGICAL SUPPLY — 90 items
BAG DECANTER FOR FLEXI CONT (MISCELLANEOUS) ×3 IMPLANT
BANDAGE ACE 4X5 VEL STRL LF (GAUZE/BANDAGES/DRESSINGS) ×1 IMPLANT
BANDAGE ACE 6X5 VEL STRL LF (GAUZE/BANDAGES/DRESSINGS) ×2 IMPLANT
BANDAGE ELASTIC 4 VELCRO ST LF (GAUZE/BANDAGES/DRESSINGS) ×3 IMPLANT
BANDAGE ELASTIC 6 VELCRO ST LF (GAUZE/BANDAGES/DRESSINGS) ×3 IMPLANT
BASKET HEART (ORDER IN 25'S) (MISCELLANEOUS) ×1
BASKET HEART (ORDER IN 25S) (MISCELLANEOUS) ×2 IMPLANT
BLADE STERNUM SYSTEM 6 (BLADE) ×3 IMPLANT
BNDG GAUZE ELAST 4 BULKY (GAUZE/BANDAGES/DRESSINGS) ×3 IMPLANT
CANISTER SUCTION 2500CC (MISCELLANEOUS) ×3 IMPLANT
CANNULA EZ GLIDE AORTIC 21FR (CANNULA) ×3 IMPLANT
CATH CPB KIT HENDRICKSON (MISCELLANEOUS) ×3 IMPLANT
CATH ROBINSON RED A/P 18FR (CATHETERS) ×5 IMPLANT
CATH THORACIC 36FR (CATHETERS) ×3 IMPLANT
CATH THORACIC 36FR RT ANG (CATHETERS) ×3 IMPLANT
CLIP TI MEDIUM 24 (CLIP) IMPLANT
CLIP TI WIDE RED SMALL 24 (CLIP) ×2 IMPLANT
CRADLE DONUT ADULT HEAD (MISCELLANEOUS) ×3 IMPLANT
DRAPE CARDIOVASCULAR INCISE (DRAPES) ×3
DRAPE SLUSH/WARMER DISC (DRAPES) ×3 IMPLANT
DRAPE SRG 135X102X78XABS (DRAPES) ×2 IMPLANT
DRSG COVADERM 4X14 (GAUZE/BANDAGES/DRESSINGS) ×3 IMPLANT
DRSG COVADERM 4X8 (GAUZE/BANDAGES/DRESSINGS) ×1 IMPLANT
ELECT BLADE 4.0 EZ CLEAN MEGAD (MISCELLANEOUS) ×3
ELECT REM PT RETURN 9FT ADLT (ELECTROSURGICAL) ×6
ELECTRODE BLDE 4.0 EZ CLN MEGD (MISCELLANEOUS) IMPLANT
ELECTRODE REM PT RTRN 9FT ADLT (ELECTROSURGICAL) ×4 IMPLANT
FELT TEFLON 1X6 (MISCELLANEOUS) ×3 IMPLANT
GAUZE SPONGE 4X4 12PLY STRL (GAUZE/BANDAGES/DRESSINGS) ×6 IMPLANT
GLOVE BIOGEL PI IND STRL 6.5 (GLOVE) IMPLANT
GLOVE BIOGEL PI INDICATOR 6.5 (GLOVE) ×7
GLOVE SURG SIGNA 7.5 PF LTX (GLOVE) ×9 IMPLANT
GOWN STRL REUS W/ TWL LRG LVL3 (GOWN DISPOSABLE) ×10 IMPLANT
GOWN STRL REUS W/ TWL XL LVL3 (GOWN DISPOSABLE) ×4 IMPLANT
GOWN STRL REUS W/TWL LRG LVL3 (GOWN DISPOSABLE) ×18
GOWN STRL REUS W/TWL XL LVL3 (GOWN DISPOSABLE) ×6
HEMOSTAT POWDER SURGIFOAM 1G (HEMOSTASIS) ×9 IMPLANT
HEMOSTAT SURGICEL 2X14 (HEMOSTASIS) ×3 IMPLANT
INSERT FOGARTY XLG (MISCELLANEOUS) IMPLANT
KIT BASIN OR (CUSTOM PROCEDURE TRAY) ×3 IMPLANT
KIT ROOM TURNOVER OR (KITS) ×3 IMPLANT
KIT SUCTION CATH 14FR (SUCTIONS) ×8 IMPLANT
KIT VASOVIEW 6 PRO VH 2400 (KITS) ×3 IMPLANT
MARKER GRAFT CORONARY BYPASS (MISCELLANEOUS) ×9 IMPLANT
NS IRRIG 1000ML POUR BTL (IV SOLUTION) ×16 IMPLANT
PACK OPEN HEART (CUSTOM PROCEDURE TRAY) ×3 IMPLANT
PAD ARMBOARD 7.5X6 YLW CONV (MISCELLANEOUS) ×6 IMPLANT
PAD ELECT DEFIB RADIOL ZOLL (MISCELLANEOUS) ×3 IMPLANT
PENCIL BUTTON HOLSTER BLD 10FT (ELECTRODE) ×3 IMPLANT
PUNCH AORTIC ROTATE  4.5MM 8IN (MISCELLANEOUS) ×2 IMPLANT
PUNCH AORTIC ROTATE 4.0MM (MISCELLANEOUS) IMPLANT
PUNCH AORTIC ROTATE 4.5MM 8IN (MISCELLANEOUS) IMPLANT
PUNCH AORTIC ROTATE 5MM 8IN (MISCELLANEOUS) IMPLANT
SET CARDIOPLEGIA MPS 5001102 (MISCELLANEOUS) ×2 IMPLANT
SPONGE GAUZE 4X4 12PLY STER LF (GAUZE/BANDAGES/DRESSINGS) ×4 IMPLANT
SPONGE LAP 4X18 X RAY DECT (DISPOSABLE) ×1 IMPLANT
SUT BONE WAX W31G (SUTURE) ×3 IMPLANT
SUT MNCRL AB 3-0 PS2 18 (SUTURE) ×2 IMPLANT
SUT MNCRL AB 4-0 PS2 18 (SUTURE) IMPLANT
SUT PROLENE 3 0 SH DA (SUTURE) ×3 IMPLANT
SUT PROLENE 4 0 RB 1 (SUTURE)
SUT PROLENE 4 0 SH DA (SUTURE) IMPLANT
SUT PROLENE 4-0 RB1 .5 CRCL 36 (SUTURE) IMPLANT
SUT PROLENE 6 0 C 1 30 (SUTURE) ×6 IMPLANT
SUT PROLENE 7 0 BV 1 (SUTURE) ×1 IMPLANT
SUT PROLENE 7 0 BV1 MDA (SUTURE) ×5 IMPLANT
SUT PROLENE 8 0 BV175 6 (SUTURE) ×1 IMPLANT
SUT SILK 4 0 REEL (SUTURE) ×2 IMPLANT
SUT STEEL 6MS V (SUTURE) ×3 IMPLANT
SUT STEEL STERNAL CCS#1 18IN (SUTURE) IMPLANT
SUT STEEL SZ 6 DBL 3X14 BALL (SUTURE) ×3 IMPLANT
SUT VIC AB 1 CTX 36 (SUTURE) ×6
SUT VIC AB 1 CTX36XBRD ANBCTR (SUTURE) ×4 IMPLANT
SUT VIC AB 2-0 CT1 27 (SUTURE)
SUT VIC AB 2-0 CT1 TAPERPNT 27 (SUTURE) IMPLANT
SUT VIC AB 2-0 CTX 27 (SUTURE) IMPLANT
SUT VIC AB 3-0 SH 27 (SUTURE) ×3
SUT VIC AB 3-0 SH 27X BRD (SUTURE) ×1 IMPLANT
SUT VIC AB 3-0 X1 27 (SUTURE) IMPLANT
SUT VICRYL 4-0 PS2 18IN ABS (SUTURE) IMPLANT
SUTURE E-PAK OPEN HEART (SUTURE) ×3 IMPLANT
SYSTEM SAHARA CHEST DRAIN ATS (WOUND CARE) ×3 IMPLANT
TAPE CLOTH SURG 4X10 WHT LF (GAUZE/BANDAGES/DRESSINGS) ×4 IMPLANT
TOWEL OR 17X24 6PK STRL BLUE (TOWEL DISPOSABLE) ×6 IMPLANT
TOWEL OR 17X26 10 PK STRL BLUE (TOWEL DISPOSABLE) ×6 IMPLANT
TRAY FOLEY IC TEMP SENS 16FR (CATHETERS) ×3 IMPLANT
TUBE FEEDING 8FR 16IN STR KANG (MISCELLANEOUS) ×3 IMPLANT
TUBING INSUFFLATION (TUBING) ×3 IMPLANT
UNDERPAD 30X30 INCONTINENT (UNDERPADS AND DIAPERS) ×3 IMPLANT
WATER STERILE IRR 1000ML POUR (IV SOLUTION) ×6 IMPLANT

## 2016-01-13 NOTE — Anesthesia Procedure Notes (Signed)
Procedure Name: Intubation Date/Time: 01/13/2016 8:48 AM Performed by: Tillman Abide Pre-anesthesia Checklist: Patient identified, Emergency Drugs available, Suction available and Patient being monitored Patient Re-evaluated:Patient Re-evaluated prior to inductionOxygen Delivery Method: Circle System Utilized Preoxygenation: Pre-oxygenation with 100% oxygen Intubation Type: IV induction Ventilation: Mask ventilation without difficulty Laryngoscope Size: Mac and 4 Tube type: Oral Tube size: 8.0 mm Number of attempts: 1 Airway Equipment and Method: Stylet Placement Confirmation: ETT inserted through vocal cords under direct vision,  positive ETCO2 and breath sounds checked- equal and bilateral Secured at: 23 cm Tube secured with: Tape Dental Injury: Teeth and Oropharynx as per pre-operative assessment

## 2016-01-13 NOTE — Progress Notes (Signed)
  Echocardiogram Echocardiogram Transesophageal has been performed.  Janalyn Harder 01/13/2016, 9:24 AM

## 2016-01-13 NOTE — Anesthesia Postprocedure Evaluation (Signed)
Anesthesia Post Note  Patient: Dennis Hopkins  Procedure(s) Performed: Procedure(s) (LRB): CORONARY ARTERY BYPASS GRAFTING (CABG) x4 Endoscopic Harvesting of the Right Greater Saphenous Vein (N/A) TRANSESOPHAGEAL ECHOCARDIOGRAM (TEE) (N/A)  Patient location during evaluation: SICU Anesthesia Type: General Level of consciousness: sedated Pain management: pain level controlled Vital Signs Assessment: post-procedure vital signs reviewed and stable Respiratory status: patient remains intubated per anesthesia plan Cardiovascular status: stable Anesthetic complications: no    Last Vitals:  Vitals:   01/13/16 1445 01/13/16 1450  BP:    Pulse: 85 89  Resp: 20 20  Temp: 36.3 C 36.2 C    Last Pain:  Vitals:   01/13/16 1415  TempSrc: Core (Comment)  PainSc:                  Dennis Hopkins

## 2016-01-13 NOTE — Progress Notes (Signed)
Pt placed on CPAP/PS by RT per rapid wean protocol. Will continue to monitor closely.  Herma Ard, RN 01/13/16 5:25 PM

## 2016-01-13 NOTE — Progress Notes (Signed)
Pt responds to voice, able to follow commands, stick tongue out, and lift head off pillow for 5 seconds. Communicated readiness to wean to RT @ 1655. Will continue to monitor closely.  Herma Ard, RN 01/13/16 4:57 PM

## 2016-01-13 NOTE — Progress Notes (Signed)
Pre-extubation ABG within parameters, RT notified.  Herma Ard, RN 01/13/16 5:51 PM

## 2016-01-13 NOTE — Progress Notes (Signed)
Rapid wean protocol started by RT @ 1701. Will continue to monitor closely.  Herma Ard, RN 01/13/16 5:01 PM

## 2016-01-13 NOTE — Progress Notes (Signed)
      [address]       [place] [address]             [phone]      S/p CABG  Extubated, neuro intact  BP 123/68   Pulse 95   Temp 99 F (37.2 C)   Resp (!) 27   Ht 5\' 10"  (1.778 m)   Wt 273 lb 4.8 oz (124 kg)   SpO2 100%   BMI 39.21 kg/m   CI= 3.0   Intake/Output Summary (Last 24 hours) at 01/13/16 1830 Last data filed at 01/13/16 1800  Gross per 24 hour  Intake          4980.94 ml  Output             3630 ml  Net          1350.94 ml    CBG well controlled  Doing well  Viviann Spare C. Dorris Fetch, MD [facility] [phone]

## 2016-01-13 NOTE — Brief Op Note (Addendum)
01/07/2016 - 01/13/2016  12:32 PM      [address]       [place] [address]             [phone]     01/07/2016 - 01/13/2016  12:32 PM  PATIENT:  Dennis Hopkins  55 y.o. male  PRE-OPERATIVE DIAGNOSIS:  LEFT MAIN/ 3 VESSEL CAD  POST-OPERATIVE DIAGNOSIS: LEFT MAIN/ 3 VESSEL CAD  PROCEDURE:  Procedure(s): CORONARY ARTERY BYPASS GRAFTING (CABG) x4   LIMA-LAD  SEQ SVG-RCA-PDA  SVG-OM1 Endoscopic Harvesting of the Right Greater Saphenous Vein([facility]) THIGH TRANSESOPHAGEAL ECHOCARDIOGRAM (TEE)  SURGEON:  Surgeon(s): Loreli Slot, MD  PHYSICIAN ASSISTANT: WAYNE GOLD PA-C  ANESTHESIA:   general  PATIENT CONDITION:  ICU - intubated and hemodynamically stable.  PRE-OPERATIVE WEIGHT: 124 kg  COMPLICATIONS: NO KNOWN  XC= 69 min CPB= 102 min  Good targets and good conduits

## 2016-01-13 NOTE — Procedures (Addendum)
Extubation Procedure Note  Patient Details:   Name: Dennis Hopkins DOB: 04/27/1961 MRN: [id number]   Airway Documentation:     Evaluation  O2 sats: stable throughout Complications: No apparent complications Patient did tolerate procedure well. Bilateral Breath Sounds: Clear, Diminished   Yes   Positive cuff leak, NIF -42, VC 1.5.  Post extubation - patient placed on nasal cannula 4 Lpm with humidity, no stridor noted, ELink notified. Patient able to get 500 with incentive spirometer.  Dennis Hopkins 01/13/2016, 6:09 PM

## 2016-01-13 NOTE — H&P (View-Only) (Signed)
Reason for Consult:Left main/  3 Vessel CAD Referring Physician: Dr. Varanasi/ [name]  Dennis Hopkins is an 55 y.o. male.  HPI: 55 yo man with a history of obesity, hypertension, hyperlipidemia, former tobacco abuse (2 ppd x 30 years, quit 2 years ago) who presents with a cc/o CP. He has been having symptoms for about 4-5 months. He saw Dr. [name] in May. He refused cath and was started on metoprolol and plavix (allergy to ASA). He continued to have pain with exertion but is very vague on the amount of exertion. He has had some episodes with minimal or no exertion. He describes the pain as a burning sensation in the left chest associated with shortness of breath. He did have pain with the left coronary injection. He is currently pain free.  Past Medical History  Diagnosis Date  . Hyperlipemia   . Hypertension   . Kidney stones 2013  . Chickenpox     Past Surgical History  Procedure Laterality Date  . Tonsillectomy    . Cardiac catheterization N/A 01/07/2016    Procedure: Left Heart Cath and Coronary Angiography;  Surgeon: Jayadeep S Varanasi, MD;  Location: [facility];  Service: Cardiovascular;  Laterality: N/A;    Family History  Problem Relation Age of Onset  . Arthritis Mother   . Arthritis Father   . Hyperlipidemia Father   . Diabetes Brother     Social History:  reports that he quit smoking about 2 years ago. His smoking use included Cigarettes. He has a 70 pack-year smoking history. He has never used smokeless tobacco. He reports that he drinks alcohol. He reports that he does not use illicit drugs.  Allergies:  Allergies  Allergen Reactions  . Asa [Aspirin] Anaphylaxis and Swelling    Lips swelling    Medications:  Prior to Admission:  Prescriptions prior to admission  Medication Sig Dispense Refill Last Dose  . clopidogrel (PLAVIX) 75 MG tablet Take 1 tablet (75 mg total) by mouth daily. 30 tablet 12 01/07/2016 at 0830  . losartan (COZAAR) 100 MG tablet  Take 1 tablet (100 mg total) by mouth daily. 90 tablet 3 01/07/2016 at Unknown time  . metoprolol succinate (TOPROL XL) 25 MG 24 hr tablet Take 1 tablet (25 mg total) by mouth daily. 90 tablet 3 01/07/2016 at Unknown time  . mometasone-formoterol (DULERA) 100-5 MCG/ACT AERO 2 puffs bid (Patient taking differently: Inhale 2 puffs into the lungs 2 (two) times daily. ) 1 Inhaler 2 Past Week at Unknown time    Results for orders placed or performed during the hospital encounter of 01/07/16 (from the past 48 hour(s))  MRSA PCR Screening     Status: None   Collection Time: 01/07/16  8:15 PM  Result Value Ref Range   MRSA by PCR NEGATIVE NEGATIVE    Comment:        The GeneXpert MRSA Assay (FDA approved for NASAL specimens only), is one component of a comprehensive MRSA colonization surveillance program. It is not intended to diagnose MRSA infection nor to guide or monitor treatment for MRSA infections.   Comprehensive metabolic panel     Status: Abnormal   Collection Time: 01/08/16  2:31 AM  Result Value Ref Range   Sodium 140 135 - 145 mmol/L   Potassium 3.7 3.5 - 5.1 mmol/L   Chloride 107 101 - 111 mmol/L   CO2 25 22 - 32 mmol/L   Glucose, Bld 180 (H) 65 - 99 mg/dL   BUN   12 6 - 20 mg/dL   Creatinine, Ser 0.92 0.61 - 1.24 mg/dL   Calcium 8.7 (L) 8.9 - 10.3 mg/dL   Total Protein 6.3 (L) 6.5 - 8.1 g/dL   Albumin 3.5 3.5 - 5.0 g/dL   AST 25 15 - 41 U/L   ALT 34 17 - 63 U/L   Alkaline Phosphatase 44 38 - 126 U/L   Total Bilirubin 0.3 0.3 - 1.2 mg/dL   GFR calc non Af Amer >60 >60 mL/min   GFR calc Af Amer >60 >60 mL/min    Comment: (NOTE) The eGFR has been calculated using the CKD EPI equation. This calculation has not been validated in all clinical situations. eGFR's persistently <60 mL/min signify possible Chronic Kidney Disease.    Anion gap 8 5 - 15  CBC     Status: Abnormal   Collection Time: 01/08/16  2:31 AM  Result Value Ref Range   WBC 6.4 4.0 - 10.5 K/uL   RBC 4.53  4.22 - 5.81 MIL/uL   Hemoglobin 12.9 (L) 13.0 - 17.0 g/dL   HCT 39.4 39.0 - 52.0 %   MCV 87.0 78.0 - 100.0 fL   MCH 28.5 26.0 - 34.0 pg   MCHC 32.7 30.0 - 36.0 g/dL   RDW 13.5 11.5 - 15.5 %   Platelets 206 150 - 400 K/uL  Lipid panel     Status: Abnormal   Collection Time: 01/08/16  2:31 AM  Result Value Ref Range   Cholesterol 224 (H) 0 - 200 mg/dL   Triglycerides 535 (H) <150 mg/dL   HDL 30 (L) >40 mg/dL   Total CHOL/HDL Ratio 7.5 RATIO   VLDL UNABLE TO CALCULATE IF TRIGLYCERIDE OVER 400 mg/dL 0 - 40 mg/dL   LDL Cholesterol UNABLE TO CALCULATE IF TRIGLYCERIDE OVER 400 mg/dL 0 - 99 mg/dL    Comment:        Total Cholesterol/HDL:CHD Risk Coronary Heart Disease Risk Table                     Men   Women  1/2 Average Risk   3.4   3.3  Average Risk       5.0   4.4  2 X Average Risk   9.6   7.1  3 X Average Risk  23.4   11.0        Use the calculated Patient Ratio above and the CHD Risk Table to determine the patient's CHD Risk.        ATP III CLASSIFICATION (LDL):  <100     mg/dL   Optimal  100-129  mg/dL   Near or Above                    Optimal  130-159  mg/dL   Borderline  160-189  mg/dL   High  >190     mg/dL   Very High   Prealbumin     Status: None   Collection Time: 01/08/16  2:31 AM  Result Value Ref Range   Prealbumin 26.0 18 - 38 mg/dL  I-STAT 3, arterial blood gas (G3+)     Status: None   Collection Time: 01/08/16  4:12 AM  Result Value Ref Range   pH, Arterial 7.394 7.350 - 7.450   pCO2 arterial 38.9 35.0 - 45.0 mmHg   pO2, Arterial 82.0 80.0 - 100.0 mmHg   Bicarbonate 23.8 20.0 - 24.0 mEq/L   TCO2 25 0 - 100 mmol/L     O2 Saturation 96.0 %   Acid-base deficit 1.0 0.0 - 2.0 mmol/L   Patient temperature 98.6 F    Collection site RADIAL, ALLEN'S TEST ACCEPTABLE    Drawn by RT    Sample type ARTERIAL     Dg Chest 2 View  01/08/2016  CLINICAL DATA:  Chest pain, shortness breath EXAM: CHEST  2 VIEW COMPARISON:  CT chest dated 12/18/2015 FINDINGS: Lungs  are clear.  No pleural effusion or pneumothorax. The heart is normal in size. Degenerative changes of the visualized thoracolumbar spine. IMPRESSION: No evidence of acute cardiopulmonary disease. Electronically Signed   By: Sriyesh  Krishnan M.D.   On: 01/08/2016 07:15    Review of Systems  Constitutional: Positive for malaise/fatigue. Negative for fever and chills.  Respiratory: Positive for shortness of breath. Negative for wheezing.   Cardiovascular: Positive for chest pain. Negative for orthopnea, claudication and leg swelling.  Gastrointestinal: Negative for nausea, vomiting and blood in stool.  Genitourinary: Negative for dysuria and frequency.  Neurological: Negative for focal weakness and loss of consciousness.  Endo/Heme/Allergies: Does not bruise/bleed easily.  All other systems reviewed and are negative.  Blood pressure 145/68, pulse 78, temperature 98.5 F (36.9 C), temperature source Oral, resp. rate 20, height 5' 10" (1.778 m), weight 273 lb 4.8 oz (123.968 kg), SpO2 99 %. Physical Exam  Vitals reviewed. Constitutional: He is oriented to person, place, and time. No distress.  obese  HENT:  Head: Normocephalic and atraumatic.  Mouth/Throat: No oropharyngeal exudate.  Very poor dentition  Eyes: Conjunctivae and EOM are normal. No scleral icterus.  Neck: Neck supple. No thyromegaly present.  No bruits  Cardiovascular: Normal rate, regular rhythm, normal heart sounds and intact distal pulses.  Exam reveals no gallop and no friction rub.   No murmur heard. Respiratory: Effort normal and breath sounds normal. No respiratory distress. He has no wheezes. He has no rales.  GI: Soft. He exhibits no distension. There is no tenderness.  Musculoskeletal: He exhibits no edema.  Lymphadenopathy:    He has no cervical adenopathy.  Neurological: He is alert and oriented to person, place, and time. No cranial nerve deficit.  No focal motor deficit  Skin: Skin is warm and dry.    CARDIAC CATHETERIZATION Conclusion     Prox RCA lesion, 90% stenosed.  Mid RCA lesion, 50% stenosed.  Ost RCA lesion, 50% stenosed.  Ost LM to LM lesion, 75% stenosed.  Distal LM lesion, 60% stenosed.  Mid LAD lesion, 25% stenosed.  Ost Cx lesion, 40% stenosed.  The left ventricular systolic function is normal.  LVEDP 21 mm Hg.  Will obtain cardiac surgery consultation. He will need aggressive secondary prevention. Will watch overnight given that he had chest pain and ECG changes with left coronary injections. He is on Plavix. Will hold this medicine for now in anticipation of CABG.    I personally reviewed the cath films and agree with above he also has significant disease in the proximal/ mid PDA  Assessment/Plan:  55 yo man with multiple CRF including obesity, hypertension, hyperlipidemia and tobacco abuse. He does not have a strong family history of premature CAD.  CABG is indicated in the setting of symptomatic left main/ 3 VD for survival benefit and relief of symptoms.  I discussed the proposed operation with Mr. Kivett and his family. I informed them of the general nature of the procedure, the incisions to used, the use of cardiopulmonary bypass, the expected hospital stay and overall recovery. He became argumentative   during the discussion saying he wanted it done through a "tiny incision" so he could go to the beach with his grandchildren in 2 weeks. I tried to explain that I did not think that would be the best way to do his operation and that we don't do bilateral thoracotomy CABG at [facility]. An even if it was done in that fashion he would not be going to the beach 2 weeks after.  His wife was understanding but he was less so. They have already paid for a place to stay. I informed then I would be happy to write a letter of medical necessity for him.  I did inform them of the indications, risks, benefits and alternatives. He understands the risks include but are not  limited to death, MI, DVT, PE, stroke, bleeding, possible need for transfusion, infection, cardiac arrhythmias, pleural effusions, as well as the possibility of other organ system dysfunction including renal, respiratory, and GI complications.  He says he will consider it, but my impression is that he will refuse to proceed.  We have penciled him onto the OR schedule for next Wednesday for CABG should he agree to it.   Bella Brummet C Demere Dotzler 01/08/2016, 4:28 PM      

## 2016-01-13 NOTE — Anesthesia Procedure Notes (Addendum)
Central Venous Catheter Insertion Performed by: anesthesiologist 01/13/2016 8:00 AM Patient location: Pre-op. Preanesthetic checklist: patient identified, IV checked, site marked, risks and benefits discussed, surgical consent, monitors and equipment checked, pre-op evaluation, timeout performed and anesthesia consent Position: Trendelenburg Lidocaine 1% used for infiltration Landmarks identified and Seldinger technique used Catheter size: 9 Fr Central line and PA cath was placed.MAC introducer Swan type and PA catheter depth:thermodilation and 50PA Cath depth:50 Procedure performed using ultrasound guided technique. Attempts: 1 Following insertion, line sutured and dressing applied. Post procedure assessment: blood return through all ports, free fluid flow and no air. Patient tolerated the procedure well with no immediate complications.

## 2016-01-13 NOTE — Transfer of Care (Signed)
Immediate Anesthesia Transfer of Care Note  Patient: Dennis Hopkins  Procedure(s) Performed: Procedure(s): CORONARY ARTERY BYPASS GRAFTING (CABG) x4 Endoscopic Harvesting of the Right Greater Saphenous Vein (N/A) TRANSESOPHAGEAL ECHOCARDIOGRAM (TEE) (N/A)  Patient Location: PACU and SICU  Anesthesia Type:General  Level of Consciousness: sedated, unresponsive and Patient remains intubated per anesthesia plan  Airway & Oxygen Therapy: Patient remains intubated per anesthesia plan and Patient placed on Ventilator (see vital sign flow sheet for setting)  Post-op Assessment: Report given to RN and Post -op Vital signs reviewed and stable  Post vital signs: Reviewed and stable  Last Vitals:  Vitals:   01/13/16 0500 01/13/16 0600  BP:  116/62  Pulse: 71 61  Resp: 12 13  Temp:      Last Pain:  Vitals:   01/13/16 0400  TempSrc:   PainSc: Asleep      Patients Stated Pain Goal: 0 (01/11/16 1200)  Complications: No apparent anesthesia complications   Tolerated transport to SICU well.  VSS BP(a-line) 116/82, HR-80 SR.  Bag mask ventilation 100% 02 12bpm on transport with monitoring.  RT at bedside to place on vent.  Tube remains at 23 lip.

## 2016-01-13 NOTE — Interval H&P Note (Signed)
History and Physical Interval Note:   01/13/2016 8:14 AM  Dennis Hopkins  has presented today for surgery, with the diagnosis of CAD  The various methods of treatment have been discussed with the patient and family. After consideration of risks, benefits and other options for treatment, the patient has consented to  Procedure(s): CORONARY ARTERY BYPASS GRAFTING (CABG) (N/A) TRANSESOPHAGEAL ECHOCARDIOGRAM (TEE) (N/A) as a surgical intervention .  The patient's history has been reviewed, patient examined, no change in status, stable for surgery.  I have reviewed the patient's chart and labs.  Questions were answered to the patient's satisfaction.     Loreli Slot

## 2016-01-14 ENCOUNTER — Inpatient Hospital Stay (HOSPITAL_COMMUNITY): Payer: BLUE CROSS/BLUE SHIELD

## 2016-01-14 ENCOUNTER — Telehealth: Payer: Self-pay | Admitting: Family Medicine

## 2016-01-14 ENCOUNTER — Encounter (HOSPITAL_COMMUNITY): Payer: Self-pay | Admitting: Thoracic Surgery (Cardiothoracic Vascular Surgery)

## 2016-01-14 DIAGNOSIS — Z951 Presence of aortocoronary bypass graft: Secondary | ICD-10-CM

## 2016-01-14 LAB — POCT I-STAT, CHEM 8
BUN: 16 mg/dL (ref 6–20)
Calcium, Ion: 1.11 mmol/L — ABNORMAL LOW (ref 1.13–1.30)
Chloride: 94 mmol/L — ABNORMAL LOW (ref 101–111)
Creatinine, Ser: 1 mg/dL (ref 0.61–1.24)
Glucose, Bld: 195 mg/dL — ABNORMAL HIGH (ref 65–99)
HCT: 36 % — ABNORMAL LOW (ref 39.0–52.0)
Hemoglobin: 12.2 g/dL — ABNORMAL LOW (ref 13.0–17.0)
Potassium: 4.3 mmol/L (ref 3.5–5.1)
Sodium: 135 mmol/L (ref 135–145)
TCO2: 28 mmol/L (ref 0–100)

## 2016-01-14 LAB — CBC
HCT: 35.5 % — ABNORMAL LOW (ref 39.0–52.0)
HCT: 33.1 % — ABNORMAL LOW (ref 39.0–52.0)
Hemoglobin: 11.5 g/dL — ABNORMAL LOW (ref 13.0–17.0)
Hemoglobin: 11 g/dL — ABNORMAL LOW (ref 13.0–17.0)
MCH: 28.2 pg (ref 26.0–34.0)
MCH: 28.6 pg (ref 26.0–34.0)
MCHC: 32.4 g/dL (ref 30.0–36.0)
MCHC: 33.2 g/dL (ref 30.0–36.0)
MCV: 87 fL (ref 78.0–100.0)
MCV: 86.2 fL (ref 78.0–100.0)
Platelets: 157 10*3/uL (ref 150–400)
Platelets: 166 10*3/uL (ref 150–400)
RBC: 4.08 MIL/uL — ABNORMAL LOW (ref 4.22–5.81)
RBC: 3.84 MIL/uL — ABNORMAL LOW (ref 4.22–5.81)
RDW: 13.5 % (ref 11.5–15.5)
RDW: 13.6 % (ref 11.5–15.5)
WBC: 12.2 10*3/uL — ABNORMAL HIGH (ref 4.0–10.5)
WBC: 14.3 10*3/uL — ABNORMAL HIGH (ref 4.0–10.5)

## 2016-01-14 LAB — GLUCOSE, CAPILLARY
Glucose-Capillary: 105 mg/dL — ABNORMAL HIGH (ref 65–99)
Glucose-Capillary: 139 mg/dL — ABNORMAL HIGH (ref 65–99)
Glucose-Capillary: 139 mg/dL — ABNORMAL HIGH (ref 65–99)
Glucose-Capillary: 147 mg/dL — ABNORMAL HIGH (ref 65–99)
Glucose-Capillary: 184 mg/dL — ABNORMAL HIGH (ref 65–99)
Glucose-Capillary: 197 mg/dL — ABNORMAL HIGH (ref 65–99)
Glucose-Capillary: 207 mg/dL — ABNORMAL HIGH (ref 65–99)
Glucose-Capillary: 97 mg/dL (ref 65–99)

## 2016-01-14 LAB — MAGNESIUM
Magnesium: 2.3 mg/dL (ref 1.7–2.4)
Magnesium: 2.3 mg/dL (ref 1.7–2.4)

## 2016-01-14 LAB — BASIC METABOLIC PANEL
Anion gap: 6 (ref 5–15)
BUN: 14 mg/dL (ref 6–20)
CO2: 23 mmol/L (ref 22–32)
Calcium: 7.7 mg/dL — ABNORMAL LOW (ref 8.9–10.3)
Chloride: 104 mmol/L (ref 101–111)
Creatinine, Ser: 0.97 mg/dL (ref 0.61–1.24)
GFR calc Af Amer: >60 mL/min (ref >60)
GFR calc non Af Amer: >60 mL/min (ref >60)
Glucose, Bld: 145 mg/dL — ABNORMAL HIGH (ref 65–99)
Potassium: 5.3 mmol/L — ABNORMAL HIGH (ref 3.5–5.1)
Sodium: 133 mmol/L — ABNORMAL LOW (ref 135–145)

## 2016-01-14 LAB — CREATININE, SERUM
Creatinine, Ser: 1.07 mg/dL (ref 0.61–1.24)
GFR calc Af Amer: >60 mL/min (ref >60)
GFR calc non Af Amer: >60 mL/min (ref >60)

## 2016-01-14 MED ORDER — INSULIN ASPART 100 UNIT/ML ~~LOC~~ SOLN
4.0000 [IU] | Freq: Three times a day (TID) | SUBCUTANEOUS | Status: DC
  Administered 2016-01-14 – 2016-01-16 (×4): 4 [IU] via SUBCUTANEOUS

## 2016-01-14 MED ORDER — ENOXAPARIN SODIUM 40 MG/0.4ML ~~LOC~~ SOLN
40.0000 mg | Freq: Every day | SUBCUTANEOUS | Status: DC
  Administered 2016-01-14 – 2016-01-17 (×4): 40 mg via SUBCUTANEOUS
  Filled 2016-01-14 (×4): qty 0.4

## 2016-01-14 MED ORDER — INSULIN ASPART 100 UNIT/ML ~~LOC~~ SOLN
0.0000 [IU] | SUBCUTANEOUS | Status: DC
  Administered 2016-01-14 (×2): 2 [IU] via SUBCUTANEOUS
  Administered 2016-01-14 (×2): 4 [IU] via SUBCUTANEOUS
  Administered 2016-01-14: 8 [IU] via SUBCUTANEOUS
  Administered 2016-01-15: 2 [IU] via SUBCUTANEOUS
  Administered 2016-01-15 (×2): 4 [IU] via SUBCUTANEOUS

## 2016-01-14 MED ORDER — INSULIN ASPART 100 UNIT/ML ~~LOC~~ SOLN: 0.0000 [IU] | SUBCUTANEOUS | Status: DC

## 2016-01-14 MED ORDER — FUROSEMIDE 10 MG/ML IJ SOLN
20.0000 mg | Freq: Once | INTRAMUSCULAR | Status: AC
  Administered 2016-01-14: 20 mg via INTRAVENOUS
  Filled 2016-01-14: qty 2

## 2016-01-14 NOTE — Progress Notes (Signed)
Doing well postop. CT already out. No arrhythmia. Pain is controlled. 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 Roughly 8 lb above preop weight. On high dose statin, aggressive glucose management, beta blocker. Resume losartan as BP rises. Anticipate rapid postop progress. Will be available if needed.  Thurmon Fair, MD, [facility] [phone] office [phone] pager

## 2016-01-14 NOTE — Op Note (Signed)
Dennis Hopkins, Dennis Hopkins NO.:  [id number]  MEDICAL RECORD NO.:  [id number]  LOCATION:  2S13C                        FACILITY:  [facility]  PHYSICIAN:  Salvatore Decent. Dorris Fetch, M.D.DATE OF BIRTH:  04-May-1961  DATE OF PROCEDURE:  01/13/2016 DATE OF DISCHARGE:                              OPERATIVE REPORT   PREOPERATIVE DIAGNOSIS:  Left main and 3-vessel coronary disease.  POSTOPERATIVE DIAGNOSIS:  Left main and 3-vessel coronary disease.  PROCEDURE:  Median sternotomy, extracorporeal circulation, Coronary artery bypass grafting x 4   Left internal mammary artery to LAD,  Saphenous vein graft to obtuse marginal 1,  Sequential saphenous vein graft to distal right coronary and posterior descending,  Endoscopic vein harvest right leg.  SURGEON:  Charlett Lango, MD.  ASSISTANT:  Rowe Clack, PA-C.  ANESTHESIA:  General.  FINDINGS:  Good quality targets and good quality conduits.  Mammary harvest difficult secondary to patient's body habitus.  OM2 not graftable.  Transesophageal echocardiography showed some inferior and anterior hypokinesis.  No significant valvular pathology.  CLINICAL NOTE:  Dennis Hopkins is a 55 year old man with multiple cardiac risk factors, who has been having chest pain for 4-5 months.  He had refused catheterization at one point, but continued to have pain including some episodes with minimal or no exertion.  He finally agreed to cardiac catheterization, which was done on January 07, 2016, that showed left main and 3-vessel coronary disease, and he was referred for coronary artery bypass grafting.  He was advised to undergo coronary bypass grafting.  The indications, risks, benefits, and alternatives were discussed in detail with the patient.  He initially was reluctant to consider surgery, but ultimately did wish to proceed.  OPERATIVE NOTE:  Dennis Hopkins was brought to the preoperative holding area on January 13, 2016.  Anesthesia placed a Swan-Ganz  catheter and arterial blood pressure monitoring catheter.  He was taken to the operating room, anesthetized, and intubated.  Transesophageal echocardiography was performed by Dr. Hart Rochester.  It revealed some anterior and inferior hypokinesis with no significant valvular pathology.  Foley catheter was placed.  Intravenous antibiotics were administered.  The chest, abdomen, and legs were prepped and draped in the usual sterile fashion.  An incision was made in the medial aspect of the right leg at the level of the knee.  The greater saphenous vein was identified and was harvested from the right thigh endoscopically.  Simultaneously with this, a median sternotomy was performed.  A Rul-tract retractor was placed and the mammary artery was harvested under direct vision.  2000 units of heparin was administered during the vessel harvest.  The remainder of full heparin dose was given after the conduits had been secured.  The saphenous vein was of good quality.  The left mammary also was of good quality, but was very difficult to take down due to the patient's body habitus and very noncompliant chest wall.  Ultimately, the mammary artery was taken down.  It was a 2-mm vessel with good flow when divided distally.  A sternal retractor was placed and slowly opened over time.  The pericardium was opened.  The ascending aorta was inspected.  It was free of atherosclerotic disease.  After confirming adequate anticoagulation with ACT measurement, the aorta was cannulated via concentric 2-0 Ethibond pledgeted pursestring sutures.  A dual-stage venous cannula was placed via a pursestring suture in the right atrial appendage. Cardiopulmonary bypass was initiated.  Flows were maintained per protocol.  The patient was cooled to 32 degrees Celsius.  The coronary arteries were inspected and anastomotic sites were chosen.  The conduits were inspected and cut to length.  A foam pad was placed in the pericardium  to insulate the heart and protect the left phrenic nerve.  A temperature probe was placed in the myocardial septum and a cardioplegia cannula was placed in the ascending aorta.  The aorta was crossclamped.  The left ventricle was emptied via the aortic root vent.  Cardiac arrest then was achieved with combination of cold antegrade blood cardioplegia and topical iced saline.  1 L of cardioplegia was administered.  There was a rapid diastolic arrest and there was septal cooling to 11 degrees Celsius.  A reversed saphenous vein graft was placed sequentially to the distal right coronary and the posterior descending.  The right coronary had a 90% stenosis proximally.  The distal vessel gave rise to 2 branches, the posterior descending branch that had a stenosis in its proximal to midportion and a posterolateral branch.  A side-to-side anastomosis was performed to the distal right coronary near the takeoff of the posterior descending to supply the posterolateral.  The right coronary was a 2.5- mm target at that site.  A side-to-side anastomosis was performed with a running 7-0 Prolene suture.  All anastomoses were probed proximally and distally at their completion to ensure patency.  The distal end of the vein then was beveled, and was anastomosed to the mid posterior descending just beyond the stenosis.  A 1.5-mm probe was passed distally but not proximally.  The end-to-side anastomosis was performed with a running 7- 0 Prolene suture.  At the completion of the second anastomosis, cardioplegia was administered down the graft and there was good flow and good hemostasis.  Additional cardioplegia was also administered via the aortic root.  A reversed saphenous vein graft then was placed end-to-side to obtuse marginal 1.  This was the dominant lateral branch.  Obtuse marginal 2 ran in the atrioventricular groove and was not accessible. Where it could be visualized on the lateral wall, it was a  tiny (less than 1 mm) Vessel, not suitable for grafting.  OM1 was superficially intramyocardial, but was a 1.5-mm good quality target.  The vein was of good quality.  It was anastomosed end-to-side with a running 7-0 Prolene suture.  Again, a probe was passed easily proximally and distally, Cardioplegia was administered and there was good flow and good hemostasis.  The left internal mammary artery was brought through a window in the pericardium.  The distal end was beveled.  It was a 2-mm good quality conduit.  It was anastomosed end-to-side to the distal LAD.  The LAD was a 1.5-mm good quality target at the site of anastomosis.  The end-to- side anastomosis was performed with a running 8-0 Prolene suture.  At the completion of the mammary to LAD anastomosis, the bulldog clamp was briefly removed to inspect for hemostasis.  Rapid septal rewarming was noted.  The bulldog clamp was replaced.  The mammary pedicle was tacked to the epicardial surface of the heart with 6-0 Prolene sutures.  Rewarming was begun.  Additional cardioplegia was administered.  The cardioplegia cannula was removed from the ascending aorta.  The vein grafts were cut to length.  The proximal vein graft anastomoses were performed to 4.5 mm punch aortotomies with running 6-0 Prolene sutures.  At the completion of the final proximal anastomosis, the patient was placed in Trendelenburg position.  Lidocaine was administered.  The bulldog clamp was again removed from the left mammary artery.  Rapid septal rewarming was again noted.  The aortic root was de- aired and the aortic crossclamp was removed.  Total crossclamp time was 69 minutes.  The patient spontaneously resumed sinus rhythm.  While rewarming was completed, all proximal and distal anastomoses were inspected for hemostasis.  Epicardial pacing wires were placed on the right ventricle and right atrium.  When the patient had rewarmed to a core temperature of 37  degrees Celsius, he was weaned from cardiopulmonary bypass on the first attempt without inotropic support.  The total bypass time was 102 minutes.  The initial cardiac index was greater than 2 L/min/m2.  A test dose of protamine was administered and was well tolerated.  The atrial and aortic cannulae were removed.  The remainder of the protamine was administered.  Shortly thereafter, there was hypotension with systemic vasodilatation, but no evidence of pulmonary vascular reactivity.  This responded to standard resuscitative measures with volume and the vasoconstrictors.  The patient then was hemodynamically stable thereafter.  There was no change in left ventricular function on TEE.  The chest was copiously irrigated with warm saline.  Hemostasis was achieved.  The pericardium was reapproximated with interrupted 3-0 silk sutures.  It came together easily without tension or kinking the underlying grafts.  The left pleural and mediastinal chest tubes were placed through separate subcostal incisions.  The sternum was closed with a combination of single and double heavy gauge stainless steel wires.  The pectoralis fascia, subcutaneous tissue, and skin were closed in standard fashion.  All sponge, needle, and instrument counts were correct at the end of the procedure.  The patient was taken from the operating room to the Surgical Intensive Care Unit intubated and in good condition.     Salvatore Decent Dorris Fetch, M.D.     SCH/MEDQ  D:  01/13/2016  T:  01/13/2016  Job:  [id number]

## 2016-01-14 NOTE — Progress Notes (Signed)
Patient ID: Dennis Hopkins, male   DOB: [birth year], 55 y.o.   MRN: [id number]   SICU Evening Rounds:   Hemodynamically stable  sats 99% Urine output good     CBC    Component Value Date/Time   WBC 14.3 (H) 01/14/2016 1650   RBC 3.84 (L) 01/14/2016 1650   HGB 12.2 (L) 01/14/2016 1656   HCT 36.0 (L) 01/14/2016 1656   PLT 166 01/14/2016 1650   MCV 86.2 01/14/2016 1650   MCH 28.6 01/14/2016 1650   MCHC 33.2 01/14/2016 1650   RDW 13.6 01/14/2016 1650   LYMPHSABS 2.4 09/03/2015 1602   MONOABS 0.6 09/03/2015 1602   EOSABS 0.2 09/03/2015 1602   BASOSABS 0.0 09/03/2015 1602     BMET    Component Value Date/Time   NA 135 01/14/2016 1656   K 4.3 01/14/2016 1656   CL 94 (L) 01/14/2016 1656   CO2 23 01/14/2016 0405   GLUCOSE 195 (H) 01/14/2016 1656   BUN 16 01/14/2016 1656   CREATININE 1.00 01/14/2016 1656   CREATININE 1.13 01/06/2016 1327   CALCIUM 7.7 (L) 01/14/2016 0405   GFRNONAA >60 01/14/2016 1650   GFRAA >60 01/14/2016 1650     A/P:  Stable postop course. Continue current plans

## 2016-01-14 NOTE — Progress Notes (Signed)
1 Day Post-Op Procedure(s) (LRB): CORONARY ARTERY BYPASS GRAFTING (CABG) x4 Endoscopic Harvesting of the Right Greater Saphenous Vein (N/A) TRANSESOPHAGEAL ECHOCARDIOGRAM (TEE) (N/A) Subjective: Some pain, but overall feels well  Objective: Vital signs in last 24 hours: Temp:  [97.2 F (36.2 C)-100 F (37.8 C)] 99.3 F (37.4 C) (07/27 0700) Pulse Rate:  [82-100] 89 (07/27 0700) Cardiac Rhythm: Normal sinus rhythm (07/27 0400) Resp:  [16-32] 26 (07/27 0700) BP: (88-124)/(50-80) 88/55 (07/27 0700) SpO2:  [95 %-100 %] 99 % (07/27 0700) Arterial Line BP: (88-135)/(50-76) 110/59 (07/27 0700) FiO2 (%):  [40 %-50 %] 40 % (07/26 1724) Weight:  [281 lb 4.9 oz (127.6 kg)] 281 lb 4.9 oz (127.6 kg) (07/27 0500)  Hemodynamic parameters for last 24 hours: PAP: (21-36)/(12-26) 28/17 CO:  [4.2 L/min-8.9 L/min] 5.1 L/min CI:  [1.8 L/min/m2-3.7 L/min/m2] 2.1 L/min/m2  Intake/Output from previous day: 07/26 0701 - 07/27 0700 In: 5658.3 [P.O.:270; I.V.:3488.3; Blood:500; IV Piggyback:1400] Out: 4630 [Urine:3215; Emesis/NG output:25; Blood:1100; Chest Tube:290] Intake/Output this shift: No intake/output data recorded.  General appearance: alert, cooperative and no distress Neurologic: intact Heart: regular rate and rhythm and faint rub Lungs: diminished breath sounds bibasilar Abdomen: normal findings: soft, non-tender  Lab Results:  Recent Labs  01/13/16 2010 01/13/16 2018 01/14/16 0405  WBC 13.9*  --  12.2*  HGB 11.0* 11.6* 11.5*  HCT 32.9* 34.0* 35.5*  PLT 168  --  157   BMET:  Recent Labs  01/13/16 0445  01/13/16 2018 01/14/16 0405  NA 138  < > 137 133*  K 4.1  < > 4.6 5.3*  CL 104  < > 101 104  CO2 22  --   --  23  GLUCOSE 152*  < > 163* 145*  BUN 18  < > 15 14  CREATININE 0.89  < > 0.70 0.97  CALCIUM 9.3  --   --  7.7*  < > = values in this interval not displayed.  PT/INR:  Recent Labs  01/13/16 1410  LABPROT 16.5*  INR 1.32   ABG    Component Value  Date/Time   PHART 7.324 (L) 01/13/2016 1902   HCO3 23.3 01/13/2016 1902   TCO2 24 01/13/2016 2018   ACIDBASEDEF 3.0 (H) 01/13/2016 1902   O2SAT 99.0 01/13/2016 1902   CBG (last 3)   Recent Labs  01/14/16 0001 01/14/16 0057 01/14/16 0349  GLUCAP 105* 97 139*    Assessment/Plan: S/P Procedure(s) (LRB): CORONARY ARTERY BYPASS GRAFTING (CABG) x4 Endoscopic Harvesting of the Right Greater Saphenous Vein (N/A) TRANSESOPHAGEAL ECHOCARDIOGRAM (TEE) (N/A) =-  CV- hemodynamics OK- dc swan  ASA allergic- restart clopidogrel  Statin and beta blocker  RESP- IS for atelectasis  RENAL- creatinine OK, K= 5.3  ENDO- CBG well controlled off insulin drip - SSI q 4  DVT prophylaxis- SCD + enoxaparin  DC Chest tubes  OOB, ambulate     LOS: 2 days    Loreli Slot 01/14/2016

## 2016-01-14 NOTE — Care Management Note (Signed)
Case Management Note  Patient Details  Name: Dennis Hopkins MRN: [id number] Date of Birth: January 02, 1961  Subjective/Objective:     S/p CABG               Action/Plan:  PTA independent from home with wife - will stated she will provide 24 hour supervision at discharge as recommended.  Wife is handicap however she states she is capable of calling for help if needed.  CM will continue to follow for discharge needs   Expected Discharge Date:                  Expected Discharge Plan:  Home/Self Care  In-House Referral:     Discharge planning Services  CM Consult  Post Acute Care Choice:    Choice offered to:     DME Arranged:    DME Agency:     HH Arranged:    HH Agency:     Status of Service:  In process, will continue to follow  If discussed at Long Length of Stay Meetings, dates discussed:    Additional Comments:  Cherylann Parr, RN 01/14/2016, 11:20 AM

## 2016-01-14 NOTE — Telephone Encounter (Signed)
Caller name: Amadeo Pihl Relation to pt: spouse Call back number:[phone] Pharmacy:  Reason for call: spouse wanted to inform Dr. Laury Axon that pt had emergency open heart surgery on yesterday, 4 blockages, states they informed them to contact his primary doctor so that she can review his notes.

## 2016-01-14 NOTE — Telephone Encounter (Signed)
To MD for review     KP 

## 2016-01-14 NOTE — Telephone Encounter (Signed)
I am aware  

## 2016-01-15 ENCOUNTER — Inpatient Hospital Stay (HOSPITAL_COMMUNITY): Payer: BLUE CROSS/BLUE SHIELD

## 2016-01-15 LAB — CBC
HCT: 30.4 % — ABNORMAL LOW (ref 39.0–52.0)
Hemoglobin: 10 g/dL — ABNORMAL LOW (ref 13.0–17.0)
MCH: 28.6 pg (ref 26.0–34.0)
MCHC: 32.9 g/dL (ref 30.0–36.0)
MCV: 86.9 fL (ref 78.0–100.0)
Platelets: 146 10*3/uL — ABNORMAL LOW (ref 150–400)
RBC: 3.5 MIL/uL — ABNORMAL LOW (ref 4.22–5.81)
RDW: 13.5 % (ref 11.5–15.5)
WBC: 11.6 10*3/uL — ABNORMAL HIGH (ref 4.0–10.5)

## 2016-01-15 LAB — GLUCOSE, CAPILLARY
Glucose-Capillary: 185 mg/dL — ABNORMAL HIGH (ref 65–99)
Glucose-Capillary: 172 mg/dL — ABNORMAL HIGH (ref 65–99)
Glucose-Capillary: 166 mg/dL — ABNORMAL HIGH (ref 65–99)
Glucose-Capillary: 167 mg/dL — ABNORMAL HIGH (ref 65–99)
Glucose-Capillary: 153 mg/dL — ABNORMAL HIGH (ref 65–99)
Glucose-Capillary: 174 mg/dL — ABNORMAL HIGH (ref 65–99)

## 2016-01-15 LAB — BASIC METABOLIC PANEL
Anion gap: 8 (ref 5–15)
BUN: 14 mg/dL (ref 6–20)
CO2: 28 mmol/L (ref 22–32)
Calcium: 8.3 mg/dL — ABNORMAL LOW (ref 8.9–10.3)
Chloride: 97 mmol/L — ABNORMAL LOW (ref 101–111)
Creatinine, Ser: 0.88 mg/dL (ref 0.61–1.24)
GFR calc Af Amer: >60 mL/min (ref >60)
GFR calc non Af Amer: >60 mL/min (ref >60)
Glucose, Bld: 166 mg/dL — ABNORMAL HIGH (ref 65–99)
Potassium: 4.2 mmol/L (ref 3.5–5.1)
Sodium: 133 mmol/L — ABNORMAL LOW (ref 135–145)

## 2016-01-15 MED ORDER — LOSARTAN POTASSIUM 50 MG PO TABS
100.0000 mg | ORAL_TABLET | Freq: Every day | ORAL | Status: DC
  Administered 2016-01-16 – 2016-01-18 (×3): 100 mg via ORAL
  Filled 2016-01-15 (×3): qty 2

## 2016-01-15 MED ORDER — METOPROLOL SUCCINATE ER 25 MG PO TB24
25.0000 mg | ORAL_TABLET | Freq: Every day | ORAL | Status: DC
  Administered 2016-01-15: 25 mg via ORAL
  Filled 2016-01-15: qty 1

## 2016-01-15 MED ORDER — INSULIN ASPART 100 UNIT/ML ~~LOC~~ SOLN
0.0000 [IU] | Freq: Three times a day (TID) | SUBCUTANEOUS | Status: DC
  Administered 2016-01-15 (×2): 3 [IU] via SUBCUTANEOUS
  Administered 2016-01-16: 2 [IU] via SUBCUTANEOUS
  Administered 2016-01-16: 3 [IU] via SUBCUTANEOUS
  Administered 2016-01-16: 2 [IU] via SUBCUTANEOUS
  Administered 2016-01-17 – 2016-01-18 (×2): 3 [IU] via SUBCUTANEOUS

## 2016-01-15 MED ORDER — LOSARTAN POTASSIUM 50 MG PO TABS: 100.0000 mg | ORAL_TABLET | Freq: Every day | ORAL | Status: DC

## 2016-01-15 MED ORDER — SODIUM CHLORIDE 0.9% FLUSH: 3.0000 mL | INTRAVENOUS | Status: DC | PRN

## 2016-01-15 MED ORDER — SODIUM CHLORIDE 0.9% FLUSH
3.0000 mL | Freq: Two times a day (BID) | INTRAVENOUS | Status: DC
  Administered 2016-01-16 – 2016-01-18 (×4): 3 mL via INTRAVENOUS

## 2016-01-15 MED ORDER — MAGNESIUM HYDROXIDE 400 MG/5ML PO SUSP: 30.0000 mL | Freq: Every day | ORAL | Status: DC | PRN

## 2016-01-15 MED ORDER — MOVING RIGHT ALONG BOOK
Freq: Once | Status: DC
  Filled 2016-01-15: qty 1

## 2016-01-15 MED ORDER — LIVING WELL WITH DIABETES BOOK
Freq: Once | Status: DC
  Filled 2016-01-15: qty 1

## 2016-01-15 MED ORDER — SODIUM CHLORIDE 0.9 % IV SOLN: 250.0000 mL | INTRAVENOUS | Status: DC | PRN

## 2016-01-15 MED ORDER — INSULIN ASPART 100 UNIT/ML ~~LOC~~ SOLN: 0.0000 [IU] | Freq: Every day | SUBCUTANEOUS | Status: DC

## 2016-01-15 MED ORDER — PROCHLORPERAZINE EDISYLATE 5 MG/ML IJ SOLN
10.0000 mg | Freq: Four times a day (QID) | INTRAMUSCULAR | Status: DC | PRN
  Filled 2016-01-15: qty 2

## 2016-01-15 MED ORDER — ZOLPIDEM TARTRATE 5 MG PO TABS
5.0000 mg | ORAL_TABLET | Freq: Every evening | ORAL | Status: DC | PRN
  Administered 2016-01-16: 5 mg via ORAL
  Filled 2016-01-15: qty 1

## 2016-01-15 MED ORDER — ALUM & MAG HYDROXIDE-SIMETH 200-200-20 MG/5ML PO SUSP: 15.0000 mL | ORAL | Status: DC | PRN

## 2016-01-15 NOTE — Progress Notes (Addendum)
Nutrition Consult/Brief Note  RD consulted for nutrition education regarding diabetes.   Lab Results  Component Value Date   HGBA1C 7.4 (H) 01/08/2016    Patient sleeping upon RD visit.  Wife at bedside.  RD provided "Carbohydrate Counting for People with Diabetes" handout from the Academy of [employer].  Wife opted to review information on her own as she's been a diabetic herself for 12 years.  Body mass index is 39.35 kg/m. Pt meets criteria for Obesity Class II based on current BMI.  Current diet order is Heart Healthy/Carbohydrate Modified, patient is consuming approximately 100% of meals at this time.   Labs and medications reviewed.   No further nutrition interventions warranted at this time. If additional nutrition issues arise, please re-consult RD.  Maureen Chatters, RD, LDN Pager #: [phone] After-Hours Pager #: [phone]

## 2016-01-15 NOTE — Progress Notes (Signed)
Spoke with patient about his elevated HgbA1C of 7.4%. States that the doctor had spoken with him about his elevated blood sugars.  Both of his parents had diabetes.  Wife has had diabetes for 10 years.  She states that she would like to talk to a dietician while here. Reviewed basic diabetes care and ordered Living Well with Diabetes booklet. Patient in some pain at this time and very tired.  Staff RNs will continue to educate patient as needed. Will continue to monitor blood sugars while in the hospital. Smith Mince RN BSN CDE

## 2016-01-15 NOTE — Progress Notes (Signed)
Pt declined ambulation in the halls. Pt educated. Will reattempt to ambulate pt in the am.  Sandrea Hammond RN

## 2016-01-15 NOTE — Progress Notes (Signed)
CARDIAC REHAB PHASE I   Pt in bed, c/o sternal pain, palpitations. Pt declines ambulation at this time. Pt states he is not sure he will be able to walk today. Reinforced the importance of ambulation 3x/day. Pt with limited receptiveness. Will follow up later today as schedule permits, if not, will follow-up tomorrow.     Joylene Grapes, RN, BSN 01/15/2016 1:08 PM

## 2016-01-15 NOTE — Progress Notes (Signed)
RT assessment done at this time. The only thing that the patient may be in need of is an order for [facility]. It appears that he takes it at home. Could MD please order?

## 2016-01-15 NOTE — Progress Notes (Signed)
2 Days Post-Op Procedure(s) (LRB): CORONARY ARTERY BYPASS GRAFTING (CABG) x4 Endoscopic Harvesting of the Right Greater Saphenous Vein (N/A) TRANSESOPHAGEAL ECHOCARDIOGRAM (TEE) (N/A) Subjective: C/o hiccups Pain "not too bad"  Objective: Vital signs in last 24 hours: Temp:  [97.7 F (36.5 C)-99.7 F (37.6 C)] 98.9 F (37.2 C) (07/28 0700) Pulse Rate:  [85-123] 101 (07/28 0700) Cardiac Rhythm: Normal sinus rhythm (07/28 0400) Resp:  [17-32] 23 (07/28 0700) BP: (110-146)/(49-80) 142/67 (07/28 0700) SpO2:  [92 %-100 %] 95 % (07/28 0700) Weight:  [274 lb 4 oz (124.4 kg)] 274 lb 4 oz (124.4 kg) (07/28 0500)  Hemodynamic parameters for last 24 hours:    Intake/Output from previous day: 07/27 0701 - 07/28 0700 In: 700 [P.O.:300; I.V.:350; IV Piggyback:50] Out: 1855 [Urine:1775; Chest Tube:80] Intake/Output this shift: No intake/output data recorded.  General appearance: alert, cooperative and no distress Neurologic: intact Heart: mildly tachy, regular Lungs: diminished breath sounds bibasilar Abdomen: normal findings: soft, non-tender Wound: dressings cleanm and dry  Lab Results:  Recent Labs  01/14/16 1650 01/14/16 1656 01/15/16 0400  WBC 14.3*  --  11.6*  HGB 11.0* 12.2* 10.0*  HCT 33.1* 36.0* 30.4*  PLT 166  --  146*   BMET:  Recent Labs  01/14/16 0405  01/14/16 1656 01/15/16 0400  NA 133*  --  135 133*  K 5.3*  --  4.3 4.2  CL 104  --  94* 97*  CO2 23  --   --  28  GLUCOSE 145*  --  195* 166*  BUN 14  --  16 14  CREATININE 0.97  < > 1.00 0.88  CALCIUM 7.7*  --   --  8.3*  < > = values in this interval not displayed.  PT/INR:  Recent Labs  01/13/16 1410  LABPROT 16.5*  INR 1.32   ABG    Component Value Date/Time   PHART 7.324 (L) 01/13/2016 1902   HCO3 23.3 01/13/2016 1902   TCO2 28 01/14/2016 1656   ACIDBASEDEF 3.0 (H) 01/13/2016 1902   O2SAT 99.0 01/13/2016 1902   CBG (last 3)   Recent Labs  01/14/16 1942 01/14/16 2328 01/15/16 0345   GLUCAP 184* 139* 185*    Assessment/Plan: S/P Procedure(s) (LRB): CORONARY ARTERY BYPASS GRAFTING (CABG) x4 Endoscopic Harvesting of the Right Greater Saphenous Vein (N/A) TRANSESOPHAGEAL ECHOCARDIOGRAM (TEE) (N/A) Plan for transfer to step-down: see transfer orders  POD # 2 CABG  CV- stable- plavix (ASA allergy), statin, lopressor  Restart losartan tomorrow  RESP- IS for bibasilar atelectasis  RENAL- creatinine OK, weight near preop- will hold off on any additional diuresis for now  Dc Foley  ENDO- CBG still elevated but trending better  Will continue SSI with meal coverage, check Hgb A1C  SCD + Enoxaparin for DVT prophylaxis  Ambulate    LOS: 3 days    Dennis Hopkins 01/15/2016

## 2016-01-16 LAB — BASIC METABOLIC PANEL
Anion gap: 9 (ref 5–15)
BUN: 16 mg/dL (ref 6–20)
CO2: 27 mmol/L (ref 22–32)
Calcium: 8.7 mg/dL — ABNORMAL LOW (ref 8.9–10.3)
Chloride: 96 mmol/L — ABNORMAL LOW (ref 101–111)
Creatinine, Ser: 0.82 mg/dL (ref 0.61–1.24)
GFR calc Af Amer: >60 mL/min (ref >60)
GFR calc non Af Amer: >60 mL/min (ref >60)
Glucose, Bld: 155 mg/dL — ABNORMAL HIGH (ref 65–99)
Potassium: 3.7 mmol/L (ref 3.5–5.1)
Sodium: 132 mmol/L — ABNORMAL LOW (ref 135–145)

## 2016-01-16 LAB — GLUCOSE, CAPILLARY
Glucose-Capillary: 166 mg/dL — ABNORMAL HIGH (ref 65–99)
Glucose-Capillary: 146 mg/dL — ABNORMAL HIGH (ref 65–99)
Glucose-Capillary: 131 mg/dL — ABNORMAL HIGH (ref 65–99)
Glucose-Capillary: 170 mg/dL — ABNORMAL HIGH (ref 65–99)

## 2016-01-16 LAB — CBC
HCT: 31.8 % — ABNORMAL LOW (ref 39.0–52.0)
Hemoglobin: 10.2 g/dL — ABNORMAL LOW (ref 13.0–17.0)
MCH: 28.2 pg (ref 26.0–34.0)
MCHC: 32.1 g/dL (ref 30.0–36.0)
MCV: 87.8 fL (ref 78.0–100.0)
Platelets: 189 10*3/uL (ref 150–400)
RBC: 3.62 MIL/uL — ABNORMAL LOW (ref 4.22–5.81)
RDW: 13.4 % (ref 11.5–15.5)
WBC: 11.2 10*3/uL — ABNORMAL HIGH (ref 4.0–10.5)

## 2016-01-16 MED ORDER — POTASSIUM CHLORIDE CRYS ER 20 MEQ PO TBCR
20.0000 meq | EXTENDED_RELEASE_TABLET | Freq: Two times a day (BID) | ORAL | Status: DC
  Administered 2016-01-16 – 2016-01-18 (×5): 20 meq via ORAL
  Filled 2016-01-16 (×5): qty 1

## 2016-01-16 MED ORDER — METOPROLOL SUCCINATE ER 50 MG PO TB24
50.0000 mg | ORAL_TABLET | Freq: Every day | ORAL | Status: DC
  Administered 2016-01-16: 50 mg via ORAL
  Filled 2016-01-16: qty 1

## 2016-01-16 MED ORDER — FUROSEMIDE 40 MG PO TABS
40.0000 mg | ORAL_TABLET | Freq: Every day | ORAL | Status: DC
  Administered 2016-01-16 – 2016-01-18 (×3): 40 mg via ORAL
  Filled 2016-01-16 (×3): qty 1

## 2016-01-16 MED ORDER — METFORMIN HCL 500 MG PO TABS
500.0000 mg | ORAL_TABLET | Freq: Two times a day (BID) | ORAL | Status: DC
  Administered 2016-01-17 – 2016-01-18 (×2): 500 mg via ORAL
  Filled 2016-01-16 (×3): qty 1

## 2016-01-16 NOTE — Progress Notes (Addendum)
[address]       [place] [address]             [phone]      3 Days Post-Op Procedure(s) (LRB): CORONARY ARTERY BYPASS GRAFTING (CABG) x4 Endoscopic Harvesting of the Right Greater Saphenous Vein (N/A) TRANSESOPHAGEAL ECHOCARDIOGRAM (TEE) (N/A) Subjective: Feels fairly well, does notice heart racing/palpitations  Objective: Vital signs in last 24 hours: Temp:  [97.7 F (36.5 C)-98.7 F (37.1 C)] 98.1 F (36.7 C) (07/29 0435) Pulse Rate:  [74-112] 74 (07/29 0435) Cardiac Rhythm: Sinus tachycardia (07/29 0700) Resp:  [20-27] 22 (07/29 0435) BP: (130-148)/(67-78) 138/75 (07/29 0435) SpO2:  [90 %-100 %] 100 % (07/29 0435) Weight:  [269 lb 1.6 oz (122.1 kg)] 269 lb 1.6 oz (122.1 kg) (07/29 0558)  Hemodynamic parameters for last 24 hours:    Intake/Output from previous day: 07/28 0701 - 07/29 0700 In: -  Out: 150 [Urine:150] Intake/Output this shift: Total I/O In: 150 [P.O.:150] Out: -   General appearance: alert, cooperative and no distress Heart: regular rate and rhythm and tachy Lungs: dimL>R base Abdomen: soft, nntender Extremities: + edema Wound: incis healing well  Lab Results:  Recent Labs  01/15/16 0400 01/16/16 0222  WBC 11.6* 11.2*  HGB 10.0* 10.2*  HCT 30.4* 31.8*  PLT 146* 189   BMET:  Recent Labs  01/15/16 0400 01/16/16 0222  NA 133* 132*  K 4.2 3.7  CL 97* 96*  CO2 28 27  GLUCOSE 166* 155*  BUN 14 16  CREATININE 0.88 0.82  CALCIUM 8.3* 8.7*    PT/INR:  Recent Labs  01/13/16 1410  LABPROT 16.5*  INR 1.32   ABG    Component Value Date/Time   PHART 7.324 (L) 01/13/2016 1902   HCO3 23.3 01/13/2016 1902   TCO2 28 01/14/2016 1656   ACIDBASEDEF 3.0 (H) 01/13/2016 1902   O2SAT 99.0 01/13/2016 1902   CBG (last 3)   Recent Labs  01/15/16 1642 01/15/16 2051 01/16/16 0537  GLUCAP 153* 174* 166*    Meds Scheduled Meds: . acetaminophen  1,000 mg Oral Q6H   Or  . acetaminophen (TYLENOL) oral  liquid 160 mg/5 mL  1,000 mg Per Tube Q6H  . atorvastatin  80 mg Oral q1800  . bisacodyl  10 mg Oral Daily   Or  . bisacodyl  10 mg Rectal Daily  . clopidogrel  75 mg Oral Daily  . docusate sodium  200 mg Oral Daily  . enoxaparin (LOVENOX) injection  40 mg Subcutaneous QHS  . insulin aspart  0-15 Units Subcutaneous TID WC  . insulin aspart  0-5 Units Subcutaneous QHS  . insulin aspart  4 Units Subcutaneous TID WC  . living well with diabetes book   Does not apply Once  . losartan  100 mg Oral Daily  . metoprolol succinate  25 mg Oral Daily  . moving right along book   Does not apply Once  . pantoprazole  40 mg Oral Daily  . sodium chloride flush  3 mL Intravenous Q12H   Continuous Infusions:  PRN Meds:.sodium chloride, alum & mag hydroxide-simeth, magnesium hydroxide, ondansetron (ZOFRAN) IV, oxyCODONE, prochlorperazine, sodium chloride flush, traMADol, zolpidem  Xrays Dg Chest Port 1 View  Result Date: 01/15/2016 CLINICAL DATA:  Shortness of breath. EXAM: PORTABLE CHEST 1 VIEW COMPARISON:  Radiograph January 14, 2016. FINDINGS: Stable cardiomegaly. No pneumothorax is noted status post left-sided chest tube removal. Swan-Ganz catheter has been removed, with sheath remaining in  right internal jugular vein. Minimal right basilar subsegmental atelectasis is noted and stable. Stable left basilar opacity is noted most consistent with atelectasis and associated effusion. Bony thorax is unremarkable. IMPRESSION: No pneumothorax status post left-sided chest tube removal. Stable left basilar subsegmental atelectasis with associated pleural effusion. Electronically Signed   By: Lupita Raider, M.D.   On: 01/15/2016 07:34   Assessment/Plan: S/P Procedure(s) (LRB): CORONARY ARTERY BYPASS GRAFTING (CABG) x4 Endoscopic Harvesting of the Right Greater Saphenous Vein (N/A) TRANSESOPHAGEAL ECHOCARDIOGRAM (TEE) (N/A)  1 doing well 2 sinus tachy, will increase beta blocker, ARB has been started today and  should tolerate as is hypertensive 3 add metformin for diabetes, A1C 7.4 preop and stop insulin for now except SSI 4 labs stable 5 has volume overload, add lasix 6 routine pulm toilet and rehab  LOS: 4 days    Dennis Hopkins,Dennis Hopkins 01/16/2016  Patient walk 100 feet with cardiac rehabilitation Agree with plans for increasing beta blocker dose Remove EPWs tomorrow patient examined and medical record reviewed,agree with above note. Kathlee Nations Trigt III 01/16/2016

## 2016-01-16 NOTE — Progress Notes (Signed)
CARDIAC REHAB PHASE I   PRE:  Rate/Rhythm: 110 ST w/ PACs  BP:  Supine:   Sitting: 124/53  Standing:    SaO2: 93% RA  MODE:  Ambulation: 150 ft   POST:  Rate/Rhythem: 119  BP:  Supine:   Sitting: 145/64  Standing:    SaO2: 94% RA  2585-2778 Pt tolerated ambulation well with assist x1 and pushing rolling walker. Gait slow, steady, c/o fatigue. To chair after walk, VSS. Call bell within reach.  Cristy Hilts

## 2016-01-17 ENCOUNTER — Inpatient Hospital Stay (HOSPITAL_COMMUNITY): Payer: BLUE CROSS/BLUE SHIELD

## 2016-01-17 LAB — BASIC METABOLIC PANEL
Anion gap: 8 (ref 5–15)
BUN: 20 mg/dL (ref 6–20)
CO2: 26 mmol/L (ref 22–32)
Calcium: 8.4 mg/dL — ABNORMAL LOW (ref 8.9–10.3)
Chloride: 100 mmol/L — ABNORMAL LOW (ref 101–111)
Creatinine, Ser: 0.99 mg/dL (ref 0.61–1.24)
GFR calc Af Amer: >60 mL/min (ref >60)
GFR calc non Af Amer: >60 mL/min (ref >60)
Glucose, Bld: 138 mg/dL — ABNORMAL HIGH (ref 65–99)
Potassium: 3.9 mmol/L (ref 3.5–5.1)
Sodium: 134 mmol/L — ABNORMAL LOW (ref 135–145)

## 2016-01-17 LAB — GLUCOSE, CAPILLARY
Glucose-Capillary: 120 mg/dL — ABNORMAL HIGH (ref 65–99)
Glucose-Capillary: 174 mg/dL — ABNORMAL HIGH (ref 65–99)
Glucose-Capillary: 135 mg/dL — ABNORMAL HIGH (ref 65–99)
Glucose-Capillary: 125 mg/dL — ABNORMAL HIGH (ref 65–99)

## 2016-01-17 MED ORDER — METOPROLOL TARTRATE 50 MG PO TABS
50.0000 mg | ORAL_TABLET | Freq: Two times a day (BID) | ORAL | Status: DC
  Administered 2016-01-17 – 2016-01-18 (×3): 50 mg via ORAL
  Filled 2016-01-17 (×3): qty 1

## 2016-01-17 NOTE — Progress Notes (Signed)
[address]       [place] [address]             [phone]      4 Days Post-Op Procedure(s) (LRB): CORONARY ARTERY BYPASS GRAFTING (CABG) x4 Endoscopic Harvesting of the Right Greater Saphenous Vein (N/A) TRANSESOPHAGEAL ECHOCARDIOGRAM (TEE) (N/A) Subjective: Feels pretty well, getting stronger  Objective: Vital signs in last 24 hours: Temp:  [98.2 F (36.8 C)-99.4 F (37.4 C)] 98.2 F (36.8 C) (07/30 0528) Pulse Rate:  [109-115] 109 (07/30 0528) Cardiac Rhythm: Sinus tachycardia (07/29 1900) Resp:  [20] 20 (07/30 0528) BP: (131-136)/(59-67) 136/59 (07/30 0528) SpO2:  [93 %] 93 % (07/30 0528) Weight:  [265 lb 10.5 oz (120.5 kg)] 265 lb 10.5 oz (120.5 kg) (07/30 0528)  Hemodynamic parameters for last 24 hours:    Intake/Output from previous day: 07/29 0701 - 07/30 0700 In: 150 [P.O.:150] Out: -  Intake/Output this shift: No intake/output data recorded.  General appearance: alert, cooperative and no distress Heart: regular rate and rhythm Lungs: dim in bases Abdomen: benign Extremities: + edema Wound: incis healing well  Lab Results:  Recent Labs  01/15/16 0400 01/16/16 0222  WBC 11.6* 11.2*  HGB 10.0* 10.2*  HCT 30.4* 31.8*  PLT 146* 189   BMET:  Recent Labs  01/16/16 0222 01/17/16 0206  NA 132* 134*  K 3.7 3.9  CL 96* 100*  CO2 27 26  GLUCOSE 155* 138*  BUN 16 20  CREATININE 0.82 0.99  CALCIUM 8.7* 8.4*    PT/INR: No results for input(s): LABPROT, INR in the last 72 hours. ABG    Component Value Date/Time   PHART 7.324 (L) 01/13/2016 1902   HCO3 23.3 01/13/2016 1902   TCO2 28 01/14/2016 1656   ACIDBASEDEF 3.0 (H) 01/13/2016 1902   O2SAT 99.0 01/13/2016 1902   CBG (last 3)   Recent Labs  01/16/16 1204 01/16/16 1634 01/16/16 2151  GLUCAP 146* 131* 170*    Meds Scheduled Meds: . acetaminophen  1,000 mg Oral Q6H   Or  . acetaminophen (TYLENOL) oral liquid 160 mg/5 mL  1,000 mg Per Tube Q6H  .  atorvastatin  80 mg Oral q1800  . bisacodyl  10 mg Oral Daily   Or  . bisacodyl  10 mg Rectal Daily  . clopidogrel  75 mg Oral Daily  . docusate sodium  200 mg Oral Daily  . enoxaparin (LOVENOX) injection  40 mg Subcutaneous QHS  . furosemide  40 mg Oral Daily  . insulin aspart  0-15 Units Subcutaneous TID WC  . insulin aspart  0-5 Units Subcutaneous QHS  . living well with diabetes book   Does not apply Once  . losartan  100 mg Oral Daily  . metFORMIN  500 mg Oral BID WC  . metoprolol succinate  50 mg Oral Daily  . moving right along book   Does not apply Once  . pantoprazole  40 mg Oral Daily  . potassium chloride  20 mEq Oral BID  . sodium chloride flush  3 mL Intravenous Q12H   Continuous Infusions:  PRN Meds:.sodium chloride, alum & mag hydroxide-simeth, magnesium hydroxide, ondansetron (ZOFRAN) IV, oxyCODONE, prochlorperazine, sodium chloride flush, traMADol, zolpidem  Xrays No results found.  Assessment/Plan: S/P Procedure(s) (LRB): CORONARY ARTERY BYPASS GRAFTING (CABG) x4 Endoscopic Harvesting of the Right Greater Saphenous Vein (N/A) TRANSESOPHAGEAL ECHOCARDIOGRAM (TEE) (N/A)  1 steady progress 2 increase beta blocker further for tachycardia 3 sugars adeq controlled for now  on metformin- will prob need higher dose 4 BP control is reasonable on current meds 5 follow creat as now on ARB and bumped a little 6 cont to push rehab and pulm toilet   LOS: 5 days    Chantry Headen E 01/17/2016

## 2016-01-18 DIAGNOSIS — E119 Type 2 diabetes mellitus without complications: Secondary | ICD-10-CM

## 2016-01-18 HISTORY — DX: Type 2 diabetes mellitus without complications: E11.9

## 2016-01-18 LAB — BASIC METABOLIC PANEL
Anion gap: 9 (ref 5–15)
BUN: 21 mg/dL — ABNORMAL HIGH (ref 6–20)
CO2: 23 mmol/L (ref 22–32)
Calcium: 8.9 mg/dL (ref 8.9–10.3)
Chloride: 101 mmol/L (ref 101–111)
Creatinine, Ser: 0.95 mg/dL (ref 0.61–1.24)
GFR calc Af Amer: >60 mL/min (ref >60)
GFR calc non Af Amer: >60 mL/min (ref >60)
Glucose, Bld: 157 mg/dL — ABNORMAL HIGH (ref 65–99)
Potassium: 3.9 mmol/L (ref 3.5–5.1)
Sodium: 133 mmol/L — ABNORMAL LOW (ref 135–145)

## 2016-01-18 LAB — GLUCOSE, CAPILLARY: Glucose-Capillary: 130 mg/dL — ABNORMAL HIGH (ref 65–99)

## 2016-01-18 LAB — HEMOGLOBIN A1C
Hgb A1c MFr Bld: 6.8 % — ABNORMAL HIGH (ref 4.8–5.6)
Mean Plasma Glucose: 148 mg/dL

## 2016-01-18 MED ORDER — METOPROLOL TARTRATE 50 MG PO TABS
50.0000 mg | ORAL_TABLET | Freq: Two times a day (BID) | ORAL | 1 refills | Status: DC
Start: 2016-01-18 — End: 2016-03-18

## 2016-01-18 MED ORDER — OXYCODONE HCL 5 MG PO TABS: 5.0000 mg | ORAL_TABLET | Freq: Four times a day (QID) | ORAL | 0 refills | Status: DC | PRN

## 2016-01-18 MED ORDER — METFORMIN HCL 500 MG PO TABS: 500.0000 mg | ORAL_TABLET | Freq: Two times a day (BID) | ORAL | 1 refills | Status: DC

## 2016-01-18 MED ORDER — FUROSEMIDE 40 MG PO TABS: 40.0000 mg | ORAL_TABLET | Freq: Every day | ORAL | 0 refills | Status: DC

## 2016-01-18 MED ORDER — POTASSIUM CHLORIDE CRYS ER 20 MEQ PO TBCR: 20.0000 meq | EXTENDED_RELEASE_TABLET | Freq: Every day | ORAL | 0 refills | Status: DC

## 2016-01-18 MED ORDER — ATORVASTATIN CALCIUM 80 MG PO TABS
80.0000 mg | ORAL_TABLET | Freq: Every day | ORAL | 1 refills | Status: DC
Start: 2016-01-18 — End: 2016-03-18

## 2016-01-18 NOTE — Discharge Instructions (Signed)
Type 2 Diabetes Mellitus, Adult °Type 2 diabetes mellitus, often simply referred to as type 2 diabetes, is a long-lasting (chronic) disease. In type 2 diabetes, the pancreas does not make enough insulin (a hormone), the cells are less responsive to the insulin that is made (insulin resistance), or both. Normally, insulin moves sugars from food into the tissue cells. The tissue cells use the sugars for energy. The lack of insulin or the lack of normal response to insulin causes excess sugars to build up in the blood instead of going into the tissue cells. As a result, high blood sugar (hyperglycemia) develops. The effect of high sugar (glucose) levels can cause many complications.  °Type 2 diabetes was also previously called adult-onset diabetes, but it can occur at any age.    °RISK FACTORS  °A person is predisposed to developing type 2 diabetes if someone in the family has the disease and also has one or more of the following primary risk factors: °· Weight gain, or being overweight or obese. °· An inactive lifestyle. °· A history of consistently eating high-calorie foods. °Maintaining a normal weight and regular physical activity can reduce the chance of developing type 2 diabetes. °SYMPTOMS  °A person with type 2 diabetes may not show symptoms initially. The symptoms of type 2 diabetes appear slowly. The symptoms include: °· Increased thirst (polydipsia). °· Increased urination (polyuria). °· Increased urination during the night (nocturia). °· Sudden or unexplained weight changes. °· Frequent, recurring infections. °· Tiredness (fatigue). °· Weakness. °· Vision changes, such as blurred vision. °· Fruity smell to your breath. °· Abdominal pain. °· Nausea or vomiting. °· Cuts or bruises which are slow to heal. °· Tingling or numbness in the hands or feet. °· An open skin wound (ulcer). °DIAGNOSIS °Type 2 diabetes is frequently not diagnosed until complications of diabetes are present. Type 2 diabetes is diagnosed  when symptoms or complications are present and when blood glucose levels are increased. Your blood glucose level may be checked by one or more of the following blood tests: °· A fasting blood glucose test. You will not be allowed to eat for at least 8 hours before a blood sample is taken. °· A random blood glucose test. Your blood glucose is checked at any time of the day regardless of when you ate. °· A hemoglobin A1c blood glucose test. A hemoglobin A1c test provides information about blood glucose control over the previous 3 months. °· An oral glucose tolerance test (OGTT). Your blood glucose is measured after you have not eaten (fasted) for 2 hours and then after you drink a glucose-containing beverage. °TREATMENT  °· You may need to take insulin or diabetes medicine daily to keep blood glucose levels in the desired range. °· If you use insulin, you may need to adjust the dosage depending on the carbohydrates that you eat with each meal or snack. °· Lifestyle changes are recommended as part of your treatment. These may include: °¨ Following an individualized diet plan developed by a nutritionist or dietitian. °¨ Exercising daily. °Your health care providers will set individualized treatment goals for you based on your age, your medicines, how long you have had diabetes, and any other medical conditions you have. Generally, the goal of treatment is to maintain the following blood glucose levels: °· Before meals (preprandial): 80-130 mg/dL. °· After meals (postprandial): below 180 mg/dL. °· A1c: less than 6.5-7%. °HOME CARE INSTRUCTIONS  °· Have your hemoglobin A1c level checked twice a year. °· Perform daily blood glucose monitoring   as directed by your health care provider. °· Monitor urine ketones when you are ill and as directed by your health care provider. °· Take your diabetes medicine or insulin as directed by your health care provider to maintain your blood glucose levels in the desired range. °¨ Never run  out of diabetes medicine or insulin. It is needed every day. °¨ If you are using insulin, you may need to adjust the amount of insulin given based on your intake of carbohydrates. Carbohydrates can raise blood glucose levels but need to be included in your diet. Carbohydrates provide vitamins, minerals, and fiber which are an essential part of a healthy diet. Carbohydrates are found in fruits, vegetables, whole grains, dairy products, legumes, and foods containing added sugars. °· Eat healthy foods. You should make an appointment to see a registered dietitian to help you create an eating plan that is right for you. °· Lose weight if you are overweight. °· Carry a medical alert card or wear your medical alert jewelry. °· Carry a 15-gram carbohydrate snack with you at all times to treat low blood glucose (hypoglycemia). Some examples of 15-gram carbohydrate snacks include: °¨ Glucose tablets, 3 or 4. °¨ Glucose gel, 15-gram tube. °¨ Raisins, 2 tablespoons (24 grams). °¨ Jelly beans, 6. °¨ Animal crackers, 8. °¨ Regular pop, 4 ounces (120 mL). °¨ Gummy treats, 9. °· Recognize hypoglycemia. Hypoglycemia occurs with blood glucose levels of 70 mg/dL and below. The risk for hypoglycemia increases when fasting or skipping meals, during or after intense exercise, and during sleep. Hypoglycemia symptoms can include: °¨ Tremors or shakes. °¨ Decreased ability to concentrate. °¨ Sweating. °¨ Increased heart rate. °¨ Headache. °¨ Dry mouth. °¨ Hunger. °¨ Irritability. °¨ Anxiety. °¨ Restless sleep. °¨ Altered speech or coordination. °¨ Confusion. °· Treat hypoglycemia promptly. If you are alert and able to safely swallow, follow the 15:15 rule: °¨ Take 15-20 grams of rapid-acting glucose or carbohydrate. Rapid-acting options include glucose gel, glucose tablets, or 4 ounces (120 mL) of fruit juice, regular soda, or low-fat milk. °¨ Check your blood glucose level 15 minutes after taking the glucose. °¨ Take 15-20 grams more of  glucose if the repeat blood glucose level is still 70 mg/dL or below. °¨ Eat a meal or snack within 1 hour once blood glucose levels return to normal. °· Be alert to feeling very thirsty and urinating more frequently than usual, which are early signs of hyperglycemia. An early awareness of hyperglycemia allows for prompt treatment. Treat hyperglycemia as directed by your health care provider. °· Engage in at least 150 minutes of moderate-intensity physical activity a week, spread over at least 3 days of the week or as directed by your health care provider. In addition, you should engage in resistance exercise at least 2 times a week or as directed by your health care provider. Try to spend no more than 90 minutes at one time inactive. °· Adjust your medicine and food intake as needed if you start a new exercise or sport. °· Follow your sick-day plan anytime you are unable to eat or drink as usual. °· Do not use any tobacco products including cigarettes, chewing tobacco, or electronic cigarettes. If you need help quitting, ask your health care provider. °· Limit alcohol intake to no more than 1 drink per day for nonpregnant women and 2 drinks per day for men. You should drink alcohol only when you are also eating food. Talk with your health care provider whether alcohol is safe   for you. Tell your health care provider if you drink alcohol several times a week.  Keep all follow-up visits as directed by your health care provider. This is important.  Schedule an eye exam soon after the diagnosis of type 2 diabetes and then annually.  Perform daily skin and foot care. Examine your skin and feet daily for cuts, bruises, redness, nail problems, bleeding, blisters, or sores. A foot exam by a health care provider should be done annually.  Brush your teeth and gums at least twice a day and floss at least once a day. Follow up with your dentist regularly.  Share your diabetes management plan with your workplace or  school.  Keep your immunizations up to date. It is recommended that you receive a flu (influenza) vaccine every year. It is also recommended that you receive a pneumonia (pneumococcal) vaccine. If you are 63 years of age or older and have never received a pneumonia vaccine, this vaccine may be given as a series of two separate shots. Ask your health care provider which additional vaccines may be recommended.  Learn to manage stress.  Obtain ongoing diabetes education and support as needed.  Participate in or seek rehabilitation as needed to maintain or improve independence and quality of life. Request a physical or occupational therapy referral if you are having foot or hand numbness, or difficulties with grooming, dressing, eating, or physical activity. SEEK MEDICAL CARE IF:   You are unable to eat food or drink fluids for more than 6 hours.  You have nausea and vomiting for more than 6 hours.  Your blood glucose level is over 240 mg/dL.  There is a change in mental status.  You develop an additional serious illness.  You have diarrhea for more than 6 hours.  You have been sick or have had a fever for a couple of days and are not getting better.  You have pain during any physical activity.  SEEK IMMEDIATE MEDICAL CARE IF:  You have difficulty breathing.  You have moderate to large ketone levels.   This information is not intended to replace advice given to you by your health care provider. Make sure you discuss any questions you have with your health care provider.   Document Released: 06/06/2005 Document Revised: 02/25/2015 Document Reviewed: 01/03/2012 Elsevier Interactive Patient Education 2016 [employer] Blood Glucose Monitoring, Adult Monitoring your blood glucose (also know as blood sugar) helps you to manage your diabetes. It also helps you and your health care provider monitor your diabetes and determine how well your treatment plan is working. WHY SHOULD YOU  MONITOR YOUR BLOOD GLUCOSE?  It can help you understand how food, exercise, and medicine affect your blood glucose.  It allows you to know what your blood glucose is at any given moment. You can quickly tell if you are having low blood glucose (hypoglycemia) or high blood glucose (hyperglycemia).  It can help you and your health care provider know how to adjust your medicines.  It can help you understand how to manage an illness or adjust medicine for exercise. WHEN SHOULD YOU TEST? Your health care provider will help you decide how often you should check your blood glucose. This may depend on the type of diabetes you have, your diabetes control, or the types of medicines you are taking. Be sure to write down all of your blood glucose readings so that this information can be reviewed with your health care provider. See below for examples of testing times that  your health care provider may suggest. Type 1 Diabetes  Test at least 2 times per day if your diabetes is well controlled, if you are using an insulin pump, or if you perform multiple daily injections.  If your diabetes is not well controlled or if you are sick, you may need to test more often.  It is a good idea to also test:  Before every insulin injection.  Before and after exercise.  Between meals and 2 hours after a meal.  Occasionally between 2:00 a.m. and 3:00 a.m. Type 2 Diabetes  If you are taking insulin, test at least 2 times per day. However, it is best to test before every insulin injection.  If you take medicines by mouth (orally), test 2 times a day.  If you are on a controlled diet, test once a day.  If your diabetes is not well controlled or if you are sick, you may need to monitor more often. HOW TO MONITOR YOUR BLOOD GLUCOSE Supplies Needed  Blood glucose meter.  Test strips for your meter. Each meter has its own strips. You must use the strips that go with your own meter.  A pricking needle  (lancet).  A device that holds the lancet (lancing device).  A journal or log book to write down your results. Procedure  Wash your hands with soap and water. Alcohol is not preferred.  Prick the side of your finger (not the tip) with the lancet.  Gently milk the finger until a small drop of blood appears.  Follow the instructions that come with your meter for inserting the test strip, applying blood to the strip, and using your blood glucose meter. Other Areas to Get Blood for Testing Some meters allow you to use other areas of your body (other than your finger) to test your blood. These areas are called alternative sites. The most common alternative sites are:  The forearm.  The thigh.  The back area of the lower leg.  The palm of the hand. The blood flow in these areas is slower. Therefore, the blood glucose values you get may be delayed, and the numbers are different from what you would get from your fingers. Do not use alternative sites if you think you are having hypoglycemia. Your reading will not be accurate. Always use a finger if you are having hypoglycemia. Also, if you cannot feel your lows (hypoglycemia unawareness), always use your fingers for your blood glucose checks. ADDITIONAL TIPS FOR GLUCOSE MONITORING  Do not reuse lancets.  Always carry your supplies with you.  All blood glucose meters have a 24-hour "hotline" number to call if you have questions or need help.  Adjust (calibrate) your blood glucose meter with a control solution after finishing a few boxes of strips. BLOOD GLUCOSE RECORD KEEPING It is a good idea to keep a daily record or log of your blood glucose readings. Most glucose meters, if not all, keep your glucose records stored in the meter. Some meters come with the ability to download your records to your home computer. Keeping a record of your blood glucose readings is especially helpful if you are wanting to look for patterns. Make notes to go  along with the blood glucose readings because you might forget what happened at that exact time. Keeping good records helps you and your health care provider to work together to achieve good diabetes management.    This information is not intended to replace advice given to you by your health  care provider. Make sure you discuss any questions you have with your health care provider.   Document Released: 06/09/2003 Document Revised: 06/27/2014 Document Reviewed: 10/29/2012 Elsevier Interactive Patient Education 2016 [employer] Endoscopic Saphenous Vein Harvesting, Care After Refer to this sheet in the next few weeks. These instructions provide you with information on caring for yourself after your procedure. Your health care provider may also give you more specific instructions. Your treatment has been planned according to current medical practices, but problems sometimes occur. Call your health care provider if you have any problems or questions after your procedure. HOME CARE INSTRUCTIONS Medicine  Take whatever pain medicine your surgeon prescribes. Follow the directions carefully. Do not take over-the-counter pain medicine unless your surgeon says it is okay. Some pain medicine can cause bleeding problems for several weeks after surgery.  Follow your surgeon's instructions about driving. You will probably not be permitted to drive after heart surgery.  Take any medicines your surgeon prescribes. Any medicines you took before your heart surgery should be checked with your health care provider before you start taking them again. Wound care  If your surgeon has prescribed an elastic bandage or stocking, ask how long you should wear it.  Check the area around your surgical cuts (incisions) whenever your bandages (dressings) are changed. Look for any redness or swelling.  You will need to return to have the stitches (sutures) or staples taken out. Ask your surgeon when to do that.  Ask your  surgeon when you can shower or bathe. Activity  Try to keep your legs raised when you are sitting.  Do any exercises your health care providers have given you. These may include deep breathing exercises, coughing, walking, or other exercises. SEEK MEDICAL CARE IF:  You have any questions about your medicines.  You have more leg pain, especially if your pain medicine stops working.  New or growing bruises develop on your leg.  Your leg swells, feels tight, or becomes red.  You have numbness in your leg. SEEK IMMEDIATE MEDICAL CARE IF:  Your pain gets much worse.  Blood or fluid leaks from any of the incisions.  Your incisions become warm, swollen, or red.  You have chest pain.  You have trouble breathing.  You have a fever.  You have more pain near your leg incision. MAKE SURE YOU:  Understand these instructions.  Will watch your condition.  Will get help right away if you are not doing well or get worse.   This information is not intended to replace advice given to you by your health care provider. Make sure you discuss any questions you have with your health care provider.   Document Released: 02/16/2011 Document Revised: 06/27/2014 Document Reviewed: 02/16/2011 Elsevier Interactive Patient Education 2016 [employer] Coronary Artery Bypass Grafting, Care After These instructions give you information on caring for yourself after your procedure. Your doctor may also give you more specific instructions. Call your doctor if you have any problems or questions after your procedure.  HOME CARE  Only take medicine as told by your doctor. Take medicines exactly as told. Do not stop taking medicines or start any new medicines without talking to your doctor first.  Take your pulse as told by your doctor.  Do deep breathing as told by your doctor. Use your breathing device (incentive spirometer), if given, to practice deep breathing several times a day. Support your chest  with a pillow or your arms when you take deep breaths or cough.  Keep the area clean, dry, and protected where the surgery cuts (incisions) were made. Remove bandages (dressings) only as told by your doctor. If strips were applied to surgical area, do not take them off. They fall off on their own.  Check the surgery area daily for puffiness (swelling), redness, or leaking fluid.  If surgery cuts were made in your legs:  Avoid crossing your legs.  Avoid sitting for long periods of time. Change positions every 30 minutes.  Raise your legs when you are sitting. Place them on pillows.  Wear stockings that help keep blood clots from forming in your legs (compression stockings).  Only take sponge baths until your doctor says it is okay to take showers. Pat the surgery area dry. Do not rub the surgery area with a washcloth or towel. Do not bathe, swim, or use a hot tub until your doctor says it is okay.  Eat foods that are high in fiber. These include raw fruits and vegetables, whole grains, beans, and nuts. Choose lean meats. Avoid canned, processed, and fried foods.  Drink enough fluids to keep your pee (urine) clear or pale yellow.  Weigh yourself every day.  Rest and limit activity as told by your doctor. You may be told to:  Stop any activity if you have chest pain, shortness of breath, changes in heartbeat, or dizziness. Get help right away if this happens.  Move around often for short amounts of time or take short walks as told by your doctor. Gradually become more active. You may need help to strengthen your muscles and build endurance.  Avoid lifting, pushing, or pulling anything heavier than 10 pounds (4.5 kg) for at least 6 weeks after surgery.  Do not drive until your doctor says it is okay.  Ask your doctor when you can go back to work.  Ask your doctor when you can begin sexual activity again.  Follow up with your doctor as told. GET HELP IF:  You have puffiness,  redness, more pain, or fluid draining from the incision site.  You have a fever.  You have puffiness in your ankles or legs.  You have pain in your legs.  You gain 2 or more pounds (0.9 kg) a day.  You feel sick to your stomach (nauseous) or throw up (vomit).  You have watery poop (diarrhea). GET HELP RIGHT AWAY IF:  You have chest pain that goes to your jaw or arms.  You have shortness of breath.  You have a fast or irregular heartbeat.  You notice a "clicking" in your breastbone when you move.  You have numbness or weakness in your arms or legs.  You feel dizzy or light-headed. MAKE SURE YOU:  Understand these instructions.  Will watch your condition.  Will get help right away if you are not doing well or get worse.   This information is not intended to replace advice given to you by your health care provider. Make sure you discuss any questions you have with your health care provider.   Document Released: 06/11/2013 Document Reviewed: 06/11/2013 Elsevier Interactive Patient Education [employer]

## 2016-01-18 NOTE — Progress Notes (Signed)
Pt refusing morning labs after unsuccessful attempts. Pt educated.  Sandrea Hammond RN

## 2016-01-18 NOTE — Discharge Summary (Signed)
Physician Discharge Summary  Patient ID: Dennis Hopkins MRN: [id number] DOB/AGE: [birth year] 55 y.o.  Admit date: 01/07/2016 Discharge date: 01/18/2016  Admission Diagnoses:Exertional angina  Discharge Diagnoses:  Principal Problem:   Exertional angina (HCC) Active Problems:   Obesity (BMI 30-39.9)   Tobacco use disorder   HTN (hypertension)   Hyperlipidemia   Coronary artery disease   S/P CABG x 4   Diabetes [facility])  Patient Active Problem List   Diagnosis Date Noted  . Diabetes (HCC) 01/18/2016  . 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  . Coronary artery disease 01/12/2016  . Exertional angina (HCC)   . Chest pain 11/11/2015  . HTN (hypertension) 10/20/2015  . Hyperlipidemia 10/20/2015  . Preventative health care 09/29/2014  . Obesity (BMI 30-39.9) 09/26/2013  . Tobacco use disorder 09/26/2013   HPI: 55 yo man with a history of obesity, hypertension, hyperlipidemia, former tobacco abuse (2 ppd x 30 years, quit 2 years ago) who presents with a cc/o CP. He has been having symptoms for about 4-5 months. He saw Dr. Jens Som in May. He refused cath and was started on metoprolol and plavix (allergy to ASA). He continued to have pain with exertion but is very vague on the amount of exertion. He has had some episodes with minimal or no exertion. He describes the pain as a burning sensation in the left chest associated with shortness of breath. He did have pain with the left coronary injection. He is currently pain free.   He underwent cardiac catheterization and was found to have severe left main and multivessel coronary artery disease as described below:  Conclusion     Prox RCA lesion, 90% stenosed.  Mid RCA lesion, 50% stenosed.  Ost RCA lesion, 50% stenosed.  Ost LM to LM lesion, 75% stenosed.  Distal LM lesion, 60% stenosed.  Mid LAD lesion, 25% stenosed.  Ost Cx lesion, 40% stenosed.  The left ventricular systolic function is normal.  LVEDP 21 mm Hg.    He was seen in  cardiothoracic surgical consultation by Dr. Charlett Lango who evaluated the patient and his studies and agreed with recommendations to proceed with CABG.   Discharged Condition: good  Hospital Course: The patient was admitted and on 01/13/2016 taken to the operating room where he underwent CABG as described below. He tolerated the procedure well was taken to the surgical intensive care unit in stable condition.  Postoperative hospital course:  The patient has overall progressed nicely. He was weaned from the ventilator without difficulty using standard protocols. All routine lines, monitors and drainage devices have been discontinued in the standard fashion. Blood sugars have been under adequate control using protocols and he has been transitioned to oral Glucophage. He understands the need to have close follow-up as an outpatient with his primary physician and to check blood sugars. He does have some postoperative volume overload and is responding well to diuretics which will continue for a short-term as an outpatient. Oxygen has been weaned and he maintains good saturations on room air. He has a mild acute blood loss anemia which is stable. Blood pressure has been elevated and ARB has been reinitiated. He has been restarted on Plavix as he has an allergy to aspirin. Incisions are healing well without evidence of infection. He is tolerating diet. His overall status is felt to be stable for discharge on today's date.  Consults: None  Significant Diagnostic Studies: Routine postoperative serial labs and chest x-ray.  Treatments: surgery:  DATE OF PROCEDURE:  01/13/2016 DATE  OF DISCHARGE:                              OPERATIVE REPORT   PREOPERATIVE DIAGNOSIS:  Left main and 3-vessel coronary disease.  POSTOPERATIVE DIAGNOSIS:  Left main and 3-vessel coronary disease.  PROCEDURE:  Median sternotomy, extracorporeal circulation, coronary artery bypass grafting x4 (left internal  mammary artery to LAD, saphenous vein graft to obtuse marginal 1, sequential saphenous vein graft to distal right coronary and posterior descending), endoscopic vein harvest, right leg.  SURGEON:  Charlett Lango, MD.  ASSISTANT:  Rowe Clack, PA-C.  ANESTHESIA:  General.  FINDINGS:  Good quality targets and good quality conduits.  Mammary harvest difficult secondary to patient's body habitus.  OM2 not graftable.  Transesophageal echocardiography showed some inferior and anterior hypokinesis.  No significant valvular pathology. Discharge Exam: Blood pressure (!) 154/71, pulse 99, temperature 98.3 F (36.8 C), temperature source Oral, resp. rate 18, height  (1.778 m), weight 263 lb 12.8 oz (119.7 kg), SpO2 94 %.  General appearance: alert, cooperative and no distress Heart: regular rate and rhythm Lungs: mildly dim in bases Abdomen: benign Extremities: + edema Wound: incis healing well  Disposition: Final discharge disposition not confirmed     Medication List    STOP taking these medications   metoprolol succinate 25 MG 24 hr tablet Commonly known as:  TOPROL XL   mometasone-formoterol 100-5 MCG/ACT Aero Commonly known as:  DULERA     TAKE these medications   atorvastatin 80 MG tablet Commonly known as:  LIPITOR Take 1 tablet (80 mg total) by mouth daily at 6 PM.   clopidogrel 75 MG tablet Commonly known as:  PLAVIX Take 1 tablet (75 mg total) by mouth daily.   furosemide 40 MG tablet Commonly known as:  LASIX Take 1 tablet (40 mg total) by mouth daily.   losartan 100 MG tablet Commonly known as:  COZAAR Take 1 tablet (100 mg total) by mouth daily.   metFORMIN 500 MG tablet Commonly known as:  GLUCOPHAGE Take 1 tablet (500 mg total) by mouth 2 (two) times daily with a meal.   metoprolol 50 MG tablet Commonly known as:  LOPRESSOR Take 1 tablet (50 mg total) by mouth 2 (two) times daily.   oxyCODONE 5 MG immediate release tablet Commonly  known as:  Oxy IR/ROXICODONE Take 1-2 tablets (5-10 mg total) by mouth every 6 (six) hours as needed for severe pain.   potassium chloride SA 20 MEQ tablet Commonly known as:  K-DUR,KLOR-CON Take 1 tablet (20 mEq total) by mouth daily.      Follow-up Information    Loreli Slot, MD .   Specialty:  Cardiothoracic Surgery Why:  office will cotact you  Contact information: [address] [place] [address] [phone]        Lance Muss, MD .   Specialties:  Cardiology, Radiology, Interventional Cardiology Why:  office will contact you with a 2 week appt Contact information: [address] [place] [address] [phone]          The patient has been discharged on:   1.Beta Blocker:  Yes [  y ]                              No   [   ]  If No, reason:  2.Ace Inhibitor/ARB: Yes [ y  ]                                     No  [    ]                                     If No, reason:  3.Statin:   Yes [ y  ]                  No  [   ]                  If No, reason:  4.Ecasa:  Yes  [   ]                  No   [n   ]                  If No, reason:allergy- is on plavix    Signed: Rafel Garde E 01/18/2016, 9:05 AM

## 2016-01-18 NOTE — Progress Notes (Signed)
Ed completed with pt and wife. Voiced understanding, receptive. Requests her referral be sent to [facility]. [phone] Ethelda Chick CES, ACSM 3:10 PM  01/18/2016

## 2016-01-18 NOTE — Progress Notes (Signed)
[address]       [place] [address]             [phone]      5 Days Post-Op Procedure(s) (LRB): CORONARY ARTERY BYPASS GRAFTING (CABG) x4 Endoscopic Harvesting of the Right Greater Saphenous Vein (N/A) TRANSESOPHAGEAL ECHOCARDIOGRAM (TEE) (N/A) Subjective: Feels ok, no specific issues  Objective: Vital signs in last 24 hours: Temp:  [97.8 F (36.6 C)-98.5 F (36.9 C)] 98.3 F (36.8 C) (07/31 0415) Pulse Rate:  [99-100] 99 (07/31 0415) Cardiac Rhythm: Sinus tachycardia (07/30 2041) Resp:  [18-20] 18 (07/31 0415) BP: (107-154)/(59-71) 154/71 (07/31 0415) SpO2:  [94 %-96 %] 94 % (07/31 0415) Weight:  [263 lb 12.8 oz (119.7 kg)] 263 lb 12.8 oz (119.7 kg) (07/31 0415)  Hemodynamic parameters for last 24 hours:    Intake/Output from previous day: No intake/output data recorded. Intake/Output this shift: No intake/output data recorded.  General appearance: alert, cooperative and no distress Heart: regular rate and rhythm Lungs: mildly dim in bases Abdomen: benign Extremities: + edema Wound: incis healing well  Lab Results:  Recent Labs  01/16/16 0222  WBC 11.2*  HGB 10.2*  HCT 31.8*  PLT 189   BMET:  Recent Labs  01/16/16 0222 01/17/16 0206  NA 132* 134*  K 3.7 3.9  CL 96* 100*  CO2 27 26  GLUCOSE 155* 138*  BUN 16 20  CREATININE 0.82 0.99  CALCIUM 8.7* 8.4*    PT/INR: No results for input(s): LABPROT, INR in the last 72 hours. ABG    Component Value Date/Time   PHART 7.324 (L) 01/13/2016 1902   HCO3 23.3 01/13/2016 1902   TCO2 28 01/14/2016 1656   ACIDBASEDEF 3.0 (H) 01/13/2016 1902   O2SAT 99.0 01/13/2016 1902   CBG (last 3)   Recent Labs  01/17/16 1132 01/17/16 1645 01/17/16 2029  GLUCAP 174* 135* 125*    Meds Scheduled Meds: . acetaminophen  1,000 mg Oral Q6H   Or  . acetaminophen (TYLENOL) oral liquid 160 mg/5 mL  1,000 mg Per Tube Q6H  . atorvastatin  80 mg Oral q1800  . bisacodyl  10 mg Oral Daily    Or  . bisacodyl  10 mg Rectal Daily  . clopidogrel  75 mg Oral Daily  . docusate sodium  200 mg Oral Daily  . enoxaparin (LOVENOX) injection  40 mg Subcutaneous QHS  . furosemide  40 mg Oral Daily  . insulin aspart  0-15 Units Subcutaneous TID WC  . insulin aspart  0-5 Units Subcutaneous QHS  . living well with diabetes book   Does not apply Once  . losartan  100 mg Oral Daily  . metFORMIN  500 mg Oral BID WC  . metoprolol tartrate  50 mg Oral BID  . moving right along book   Does not apply Once  . pantoprazole  40 mg Oral Daily  . potassium chloride  20 mEq Oral BID  . sodium chloride flush  3 mL Intravenous Q12H   Continuous Infusions:  PRN Meds:.sodium chloride, alum & mag hydroxide-simeth, magnesium hydroxide, ondansetron (ZOFRAN) IV, oxyCODONE, prochlorperazine, sodium chloride flush, traMADol, zolpidem  Xrays Dg Chest 2 View  Result Date: 01/17/2016 CLINICAL DATA:  CABG EXAM: CHEST  2 VIEW COMPARISON:  01/15/2016 FINDINGS: Changes of CABG. Cardiomegaly with vascular congestion. Low lung volumes with bibasilar atelectasis. Suspect layering effusions. No pneumothorax. IMPRESSION: Stable cardiomegaly, vascular congestion with bibasilar atelectasis and small effusions. Electronically Signed   By:  Charlett Nose M.D.   On: 01/17/2016 07:32   Assessment/Plan: S/P Procedure(s) (LRB): CORONARY ARTERY BYPASS GRAFTING (CABG) x4 Endoscopic Harvesting of the Right Greater Saphenous Vein (N/A) TRANSESOPHAGEAL ECHOCARDIOGRAM (TEE) (N/A)  1 BMET is pending- checking renal fxn 2 sugars ok, discussed with patient importance of follow-up with family doc 3 d/c wires this am 4 prob d/c later today    LOS: 6 days    Sun Kihn E 01/18/2016

## 2016-01-26 DIAGNOSIS — Z736 Limitation of activities due to disability: Secondary | ICD-10-CM

## 2016-01-29 ENCOUNTER — Telehealth: Payer: Self-pay

## 2016-01-29 MED ORDER — TRAMADOL HCL 50 MG PO TABS: 50.0000 mg | ORAL_TABLET | Freq: Four times a day (QID) | ORAL | 0 refills | Status: DC | PRN

## 2016-01-29 NOTE — Telephone Encounter (Signed)
Patient's wife mentioned that her husband has been showing verbal aggression since he has been home. It might be pain mediation.  We changed his med from oxycodone to Tramadol and Faxed to pharm. She was advised to make appt with PCP to discuss his diabetes and personality changes.

## 2016-02-04 ENCOUNTER — Ambulatory Visit (INDEPENDENT_AMBULATORY_CARE_PROVIDER_SITE_OTHER): Payer: BLUE CROSS/BLUE SHIELD | Admitting: Family Medicine

## 2016-02-04 ENCOUNTER — Other Ambulatory Visit: Payer: Self-pay | Admitting: Family Medicine

## 2016-02-04 VITALS — BP 112/78 | HR 80 | Temp 98.0°F | Wt 255.2 lb

## 2016-02-04 DIAGNOSIS — E785 Hyperlipidemia, unspecified: Secondary | ICD-10-CM | POA: Diagnosis not present

## 2016-02-04 DIAGNOSIS — Z1159 Encounter for screening for other viral diseases: Secondary | ICD-10-CM

## 2016-02-04 DIAGNOSIS — I10 Essential (primary) hypertension: Secondary | ICD-10-CM

## 2016-02-04 DIAGNOSIS — Z951 Presence of aortocoronary bypass graft: Secondary | ICD-10-CM

## 2016-02-04 NOTE — Patient Instructions (Signed)

## 2016-02-04 NOTE — Progress Notes (Signed)
Pre visit review using our clinic review tool, if applicable. No additional management support is needed unless otherwise documented below in the visit note. 

## 2016-02-04 NOTE — Progress Notes (Signed)
Patient ID: Dennis Hopkins, male    DOB: 1961-06-18  Age: 55 y.o. MRN: [id number]    Subjective:  Subjective  HPI Dennis Hopkins presents for f/u hosp with angina and chest pain.  Review of Systems  Constitutional: Negative for appetite change, diaphoresis, fatigue and unexpected weight change.  Eyes: Negative for pain, redness and visual disturbance.  Respiratory: Negative for cough, chest tightness, shortness of breath and wheezing.   Cardiovascular: Negative for chest pain, palpitations and leg swelling.  Endocrine: Negative for cold intolerance, heat intolerance, polydipsia, polyphagia and polyuria.  Genitourinary: Negative for difficulty urinating, dysuria and frequency.  Neurological: Negative for dizziness, light-headedness, numbness and headaches.    History Past Medical History:  Diagnosis Date  . Chickenpox   . Hyperlipemia   . Hypertension   . Kidney stones 2013    He has a past surgical history that includes Tonsillectomy; Cardiac catheterization (N/A, 01/07/2016); Coronary artery bypass graft (N/A, 01/13/2016); and TEE without cardioversion (N/A, 01/13/2016).   His family history includes Arthritis in his father and mother; Diabetes in his brother; Hyperlipidemia in his father.He reports that he quit smoking about 2 years ago. His smoking use included Cigarettes. He has a 70.00 pack-year smoking history. He has never used smokeless tobacco. He reports that he drinks alcohol. He reports that he does not use drugs.  Current Outpatient Prescriptions on File Prior to Visit  Medication Sig Dispense Refill  . atorvastatin (LIPITOR) 80 MG tablet Take 1 tablet (80 mg total) by mouth daily at 6 PM. 30 tablet 1  . clopidogrel (PLAVIX) 75 MG tablet Take 1 tablet (75 mg total) by mouth daily. 30 tablet 12  . furosemide (LASIX) 40 MG tablet Take 1 tablet (40 mg total) by mouth daily. 7 tablet 0  . losartan (COZAAR) 100 MG tablet Take 1 tablet (100 mg total) by mouth daily. 90 tablet  3  . metFORMIN (GLUCOPHAGE) 500 MG tablet Take 1 tablet (500 mg total) by mouth 2 (two) times daily with a meal. 60 tablet 1  . metoprolol (LOPRESSOR) 50 MG tablet Take 1 tablet (50 mg total) by mouth 2 (two) times daily. 60 tablet 1  . potassium chloride SA (K-DUR,KLOR-CON) 20 MEQ tablet Take 1 tablet (20 mEq total) by mouth daily. 7 tablet 0  . traMADol (ULTRAM) 50 MG tablet Take 1 tablet (50 mg total) by mouth every 6 (six) hours as needed. 40 tablet 0   No current facility-administered medications on file prior to visit.      Objective:  Objective  Physical Exam  Constitutional: He is oriented to person, place, and time. Vital signs are normal. He appears well-developed and well-nourished. He is sleeping.  HENT:  Head: Normocephalic and atraumatic.  Mouth/Throat: Oropharynx is clear and moist.  Eyes: EOM are normal. Pupils are equal, round, and reactive to light.  Neck: Normal range of motion. Neck supple. No thyromegaly present.  Cardiovascular: Normal rate and regular rhythm.   No murmur heard. Pulmonary/Chest: Effort normal and breath sounds normal. No respiratory distress. He has no wheezes. He has no rales. He exhibits no tenderness.  Musculoskeletal: He exhibits no edema or tenderness.  Neurological: He is alert and oriented to person, place, and time.  Skin: Skin is warm and dry.  Psychiatric: He has a normal mood and affect. His behavior is normal. Judgment and thought content normal.  Nursing note and vitals reviewed.  BP 112/78 (BP Location: Right Arm, Patient Position: Sitting, Cuff Size: Normal)   Pulse  80   Temp 98 F (36.7 C) (Other (Comment))   Wt 255 lb 3.2 oz (115.8 kg)   SpO2 98%   BMI 36.62 kg/m  Wt Readings from Last 3 Encounters:  02/04/16 255 lb 3.2 oz (115.8 kg)  01/18/16 263 lb 12.8 oz (119.7 kg)  01/06/16 271 lb 12.8 oz (123.3 kg)     Lab Results  Component Value Date   WBC 11.2 (H) 01/16/2016   HGB 10.2 (L) 01/16/2016   HCT 31.8 (L)  01/16/2016   PLT 189 01/16/2016   GLUCOSE 157 (H) 01/18/2016   CHOL 224 (H) 01/08/2016   TRIG 535 (H) 01/08/2016   HDL 30 (L) 01/08/2016   LDLDIRECT 161.0 09/03/2015   LDLCALC UNABLE TO CALCULATE IF TRIGLYCERIDE OVER 400 mg/dL 16/10/960407/21/2017   ALT 34 54/09/811907/21/2017   AST 25 01/08/2016   NA 133 (L) 01/18/2016   K 3.9 01/18/2016   CL 101 01/18/2016   CREATININE 0.95 01/18/2016   BUN 21 (H) 01/18/2016   CO2 23 01/18/2016   TSH 1.54 09/29/2014   INR 1.32 01/13/2016   HGBA1C 6.8 (H) 01/16/2016   MICROALBUR 1.1 09/29/2014    Dg Chest 2 View  Result Date: 01/08/2016 CLINICAL DATA:  Chest pain, shortness breath EXAM: CHEST  2 VIEW COMPARISON:  CT chest dated 12/18/2015 FINDINGS: Lungs are clear.  No pleural effusion or pneumothorax. The heart is normal in size. Degenerative changes of the visualized thoracolumbar spine. IMPRESSION: No evidence of acute cardiopulmonary disease. Electronically Signed   By: Dennis BillsSriyesh  Krishnan M.D.   On: 01/08/2016 07:15     Assessment & Plan:  Plan  I am having Dennis Hopkins maintain his clopidogrel, losartan, atorvastatin, furosemide, metFORMIN, metoprolol, potassium chloride SA, traMADol, and oxyCODONE.  Meds ordered this encounter  Medications  . oxyCODONE (OXY IR/ROXICODONE) 5 MG immediate release tablet    Sig: Take 1 tablet by mouth as needed.    Refill:  0    Problem List Items Addressed This Visit      Unprioritized   HTN (hypertension)    Stable con't meds      Hyperlipidemia    con't statin Check labs in 3 months      S/P CABG x 4    con't  meds F/u cardiology No further cp        Other Visit Diagnoses    Need for hepatitis C screening test    -  Primary   Relevant Orders   Hepatitis C antibody      Follow-up: Return in about 3 months (around 05/06/2016) for hypertension, hyperlipidemia, diabetes II.  Dennis SchultzYvonne R Lowne Chase, DO

## 2016-02-05 ENCOUNTER — Encounter: Payer: Self-pay | Admitting: Family Medicine

## 2016-02-05 NOTE — Assessment & Plan Note (Signed)
Recheck labs in 3 months

## 2016-02-05 NOTE — Assessment & Plan Note (Signed)
con't  meds and recheck labs in 3 months D/w diet and exercise

## 2016-02-05 NOTE — Assessment & Plan Note (Signed)
con't statin Check labs in 3 months

## 2016-02-05 NOTE — Assessment & Plan Note (Signed)
Stable con't meds 

## 2016-02-05 NOTE — Assessment & Plan Note (Addendum)
con't  meds F/u cardiology No further cp

## 2016-02-11 ENCOUNTER — Encounter: Payer: Self-pay | Admitting: Cardiology

## 2016-02-12 ENCOUNTER — Other Ambulatory Visit: Payer: Self-pay | Admitting: Thoracic Surgery (Cardiothoracic Vascular Surgery)

## 2016-02-12 DIAGNOSIS — Z951 Presence of aortocoronary bypass graft: Secondary | ICD-10-CM

## 2016-02-15 ENCOUNTER — Ambulatory Visit
Admission: RE | Admit: 2016-02-15 | Discharge: 2016-02-15 | Disposition: A | Discharge status: Home / Self Care | Payer: BLUE CROSS/BLUE SHIELD | Source: Ambulatory Visit | Attending: Thoracic Surgery (Cardiothoracic Vascular Surgery) | Admitting: Thoracic Surgery (Cardiothoracic Vascular Surgery)

## 2016-02-15 ENCOUNTER — Ambulatory Visit (INDEPENDENT_AMBULATORY_CARE_PROVIDER_SITE_OTHER): Payer: Self-pay | Admitting: Surgical

## 2016-02-15 VITALS — BP 124/81 | HR 67 | Resp 18 | Ht 70.0 in | Wt 255.0 lb

## 2016-02-15 DIAGNOSIS — Z951 Presence of aortocoronary bypass graft: Secondary | ICD-10-CM

## 2016-02-15 MED ORDER — POTASSIUM CHLORIDE CRYS ER 20 MEQ PO TBCR: 20.0000 meq | EXTENDED_RELEASE_TABLET | Freq: Every day | ORAL | 0 refills | Status: DC

## 2016-02-15 MED ORDER — FUROSEMIDE 40 MG PO TABS: 40.0000 mg | ORAL_TABLET | Freq: Every day | ORAL | 0 refills | Status: DC

## 2016-02-15 NOTE — Progress Notes (Signed)
[address]       [place] [address]             [phone]                  Dennis Hopkins [facility] Medical Record #[id number] Date of Birth: Feb 19, 1961  Referring IO:[email or web address], Dennis Coffin, MD Primary Cardiology: Primary Care:Dennis R Zola Button, DO  Chief Complaint:  Follow Up Visit                              OPERATIVE REPORT   PREOPERATIVE DIAGNOSIS:  Left main and 3-vessel coronary disease.  POSTOPERATIVE DIAGNOSIS:  Left main and 3-vessel coronary disease.  PROCEDURE:  Median sternotomy, extracorporeal circulation, Coronary artery bypass grafting x 4                        Left internal mammary artery to LAD,                       Saphenous vein graft to obtuse marginal 1,                       Sequential saphenous vein graft to distal right coronary and posterior descending,                       Endoscopic vein harvest right leg.  SURGEON:  Dennis Lango, MD.  ASSISTANT:  Dennis Clack, PA-C.  ANESTHESIA:  General.  FINDINGS:  Good quality targets and good quality conduits.  Mammary harvest difficult secondary to patient's body habitus.  OM2 not graftable.  Transesophageal echocardiography showed some inferior and anterior hypokinesis.  No significant valvular pathology.  History of Present Illness:    55 year old male s/p above procedure seen for routine office follow-up. He is doing well, mild sternal discomfort at time but no other issues currently.          Zubrod Score: At the time of surgery this patient's most appropriate activity status/level should be described as: []     0    Normal activity, no symptoms []     1    Restricted in physical strenuous activity but ambulatory, able to do out light work []     2    Ambulatory and capable of self care, unable to do work activities, up and about                 >50 % of waking hours                                                                                    []     3    Only limited self care, in bed greater than 50% of waking hours []     4    Completely disabled, no self care, confined to bed or chair []     5    Moribund  History  Smoking Status  . Former Smoker  . Packs/day: 2.00  .  Years: 35.00  . Types: Cigarettes  . Quit date: 12/21/2013  Smokeless Tobacco  . Never Used       Allergies  Allergen Reactions  . Asa [Aspirin] Anaphylaxis and Swelling    Lips swelling    Current Outpatient Prescriptions  Medication Sig Dispense Refill  . atorvastatin (LIPITOR) 80 MG tablet Take 1 tablet (80 mg total) by mouth daily at 6 PM. 30 tablet 1  . furosemide (LASIX) 40 MG tablet Take 1 tablet (40 mg total) by mouth daily. 7 tablet 0  . metFORMIN (GLUCOPHAGE) 500 MG tablet Take 1 tablet (500 mg total) by mouth 2 (two) times daily with a meal. 60 tablet 1  . metoprolol (LOPRESSOR) 50 MG tablet Take 1 tablet (50 mg total) by mouth 2 (two) times daily. 60 tablet 1  . potassium chloride SA (K-DUR,KLOR-CON) 20 MEQ tablet Take 1 tablet (20 mEq total) by mouth daily. 7 tablet 0  . traMADol (ULTRAM) 50 MG tablet Take 1 tablet (50 mg total) by mouth every 6 (six) hours as needed. 40 tablet 0  . clopidogrel (PLAVIX) 75 MG tablet Take 1 tablet (75 mg total) by mouth daily. (Patient not taking: Reported on 02/15/2016) 30 tablet 12   No current facility-administered medications for this visit.        Physical Exam: BP 124/81   Pulse 67   Resp 18   Ht 5\' 10"  (1.778 m)   Wt 255 lb (115.7 kg)   SpO2 98% Comment: ON RA  BMI 36.59 kg/m   General appearance: alert, cooperative and no distress Heart: regular rate and rhythm Lungs: clear to auscultation bilaterally Abdomen: benign exam Extremities: minor LE edema Wounds:incis healing well, no sternal click or mobility   Diagnostic Studies & Laboratory data:         Recent Radiology Findings: Dg Chest 2 View  Result Date: 02/15/2016 CLINICAL DATA:  CABG. EXAM: CHEST  2 VIEW COMPARISON:   01/17/2016. FINDINGS: Prior CABG. Cardiomegaly with normal pulmonary vascularity. Low lung volumes. Mild right base atelectasis and or infiltrate cannot be excluded. Small right pleural effusion noted. No pneumothorax. IMPRESSION: 1.  Prior CABG.  Stable cardiomegaly. 2. Lung volumes. Mild right base atelectasis and or infiltrate cannot be excluded. Small right pleural effusion. Electronically Signed   By: Maisie Fushomas  Register   On: 02/15/2016 11:52      I have independently reviewed the above radiology findings and reviewed findings  with the patient.  Recent Labs: Lab Results  Component Value Date   WBC 11.2 (H) 01/16/2016   HGB 10.2 (L) 01/16/2016   HCT 31.8 (L) 01/16/2016   PLT 189 01/16/2016   GLUCOSE 157 (H) 01/18/2016   CHOL 224 (H) 01/08/2016   TRIG 535 (H) 01/08/2016   HDL 30 (L) 01/08/2016   LDLDIRECT 161.0 09/03/2015   LDLCALC UNABLE TO CALCULATE IF TRIGLYCERIDE OVER 400 mg/dL 16/10/960407/21/2017   ALT 34 54/09/811907/21/2017   AST 25 01/08/2016   NA 133 (L) 01/18/2016   K 3.9 01/18/2016   CL 101 01/18/2016   CREATININE 0.95 01/18/2016   BUN 21 (H) 01/18/2016   CO2 23 01/18/2016   TSH 1.54 09/29/2014   INR 1.32 01/13/2016   HGBA1C 6.8 (H) 01/16/2016      Assessment / Plan:  Doing well Renewed lasix/KCL for 1 week . F/u prn for any surgical issues or at request.        Dennis Hopkins E 02/15/2016 1:33 PM

## 2016-02-15 NOTE — Patient Instructions (Signed)
Instructions given for activity progression and driving

## 2016-02-20 NOTE — Progress Notes (Signed)
HPI: FU CAD; has ASA allergy. Chest CT 6/17 showed LM and 3 vessel calcium; aortic root 3.9 cm. Cath 7/17 showed severe 3 vessel CAD; carotid dopplers 7/17 showed 1-39 bilateral stenosis. Had CABG 7/17 with LIMA to LAD, SVG to OM1 and sequential SVG to RCA and PDA. Since DC, the patient has dyspnea with more extreme activities but not with routine activities. It is relieved with rest. It is not associated with chest pain. There is no orthopnea, PND or pedal edema. There is no syncope or palpitations. There is no exertional chest pain.   Current Outpatient Prescriptions  Medication Sig Dispense Refill  . atorvastatin (LIPITOR) 80 MG tablet Take 1 tablet (80 mg total) by mouth daily at 6 PM. 30 tablet 1  . metFORMIN (GLUCOPHAGE) 500 MG tablet Take 1 tablet (500 mg total) by mouth 2 (two) times daily with a meal. 60 tablet 1  . metoprolol (LOPRESSOR) 50 MG tablet Take 1 tablet (50 mg total) by mouth 2 (two) times daily. 60 tablet 1  . traMADol (ULTRAM) 50 MG tablet Take 1 tablet (50 mg total) by mouth every 6 (six) hours as needed. 40 tablet 0   No current facility-administered medications for this visit.      Past Medical History:  Diagnosis Date  . Chickenpox   . Hyperlipemia   . Hypertension   . Kidney stones 2013    Past Surgical History:  Procedure Laterality Date  . CARDIAC CATHETERIZATION N/A 01/07/2016   Procedure: Left Heart Cath and Coronary Angiography;  Surgeon: Corky CraftsJayadeep S Varanasi, MD;  [facility];  Service: Cardiovascular;  Laterality: N/A;  . CORONARY ARTERY BYPASS GRAFT N/A 01/13/2016   Procedure: CORONARY ARTERY BYPASS GRAFTING (CABG) x4 Endoscopic Harvesting of the Right Greater Saphenous Vein;  Surgeon: Loreli SlotSteven C Hendrickson, MD;  [facility];  Service: Open Heart Surgery;  Laterality: N/A;  . TEE WITHOUT CARDIOVERSION N/A 01/13/2016   Procedure: TRANSESOPHAGEAL ECHOCARDIOGRAM (TEE);  Surgeon: Loreli SlotSteven C Hendrickson, MD;  [facility];   Service: Open Heart Surgery;  Laterality: N/A;  . TONSILLECTOMY      Social History   Social History  . Marital status: Married    Spouse name: N/A  . Number of children: 1  . Years of education: N/A   Occupational History  . Not on file.   Social History Main Topics  . Smoking status: Former Smoker    Packs/day: 2.00    Years: 35.00    Types: Cigarettes    Quit date: 12/21/2013  . Smokeless tobacco: Never Used  . Alcohol use 0.0 oz/week     Comment: 1-2 drinks per night  . Drug use: No  . Sexual activity: Yes    Partners: Female   Other Topics Concern  . Not on file   Social History Narrative  . No narrative on file    Family History  Problem Relation Age of Onset  . Arthritis Mother   . Arthritis Father   . Hyperlipidemia Father   . Diabetes Brother     ROS: no fevers or chills, productive cough, hemoptysis, dysphasia, odynophagia, melena, hematochezia, dysuria, hematuria, rash, seizure activity, orthopnea, PND, pedal edema, claudication. Remaining systems are negative.  Physical Exam: Well-developed well-nourished in no acute distress.  Skin is warm and dry.  HEENT is normal.  Neck is supple.  Chest is clear to auscultation with normal expansion. Sternotomy without evidence of infection. Cardiovascular exam is regular rate and rhythm.  Abdominal exam nontender or distended. No masses palpated. Extremities show no edema. neuro grossly intact  ECG- Sinus rhythm at a rate of 68. Nonspecific ST changes.  A/P  1 Coronary artery disease-patient has an allergy to aspirin. Add Plavix 75 mg daily. Continue statin.  2 hyperlipidemia-continue Lipitor. Check lipids and liver.  3 hypertension-blood pressure is controlled. Continue present medications.  4 dilated aorta-follow-up chest CT June 2018.  Dennis Hopkins

## 2016-02-24 ENCOUNTER — Encounter: Payer: Self-pay | Admitting: Cardiology

## 2016-02-24 ENCOUNTER — Ambulatory Visit (INDEPENDENT_AMBULATORY_CARE_PROVIDER_SITE_OTHER): Payer: BLUE CROSS/BLUE SHIELD | Admitting: Cardiology

## 2016-02-24 VITALS — BP 133/81 | HR 68 | Ht 70.0 in | Wt 256.8 lb

## 2016-02-24 DIAGNOSIS — I10 Essential (primary) hypertension: Secondary | ICD-10-CM

## 2016-02-24 DIAGNOSIS — I2510 Atherosclerotic heart disease of native coronary artery without angina pectoris: Secondary | ICD-10-CM

## 2016-02-24 DIAGNOSIS — E785 Hyperlipidemia, unspecified: Secondary | ICD-10-CM | POA: Diagnosis not present

## 2016-02-24 DIAGNOSIS — Z79899 Other long term (current) drug therapy: Secondary | ICD-10-CM | POA: Diagnosis not present

## 2016-02-24 MED ORDER — CLOPIDOGREL BISULFATE 75 MG PO TABS: 75.0000 mg | ORAL_TABLET | Freq: Every day | ORAL | 3 refills | Status: DC

## 2016-02-24 NOTE — Patient Instructions (Signed)
Your physician recommends that you continue on your current medications as directed. Please refer to the Current Medication list given to you today. RESTART  PLAVIX  75 MG   EVERY DAY  Your physician wants you to follow-up in:   6 MONTHS  WITH DR  Jens SomRENSHAW You will receive a reminder letter in the mail two months in advance. If you don't receive a letter, please call our office to schedule the follow-up appointment.  Your physician recommends that you return for lab work in:  FASTING  LIPID  LIVER  BMET

## 2016-02-25 ENCOUNTER — Ambulatory Visit (INDEPENDENT_AMBULATORY_CARE_PROVIDER_SITE_OTHER): Payer: BLUE CROSS/BLUE SHIELD | Admitting: Family Medicine

## 2016-02-25 VITALS — BP 119/57 | HR 57 | Temp 98.2°F | Resp 17 | Ht 72.0 in | Wt 257.0 lb

## 2016-02-25 DIAGNOSIS — G5602 Carpal tunnel syndrome, left upper limb: Secondary | ICD-10-CM

## 2016-02-25 MED ORDER — TRAMADOL HCL 50 MG PO TABS: 50.0000 mg | ORAL_TABLET | Freq: Three times a day (TID) | ORAL | 0 refills | Status: DC | PRN

## 2016-02-25 NOTE — Patient Instructions (Signed)
Carpal Tunnel Release  Carpal tunnel release is a surgical procedure to relieve numbness and pain in your hand that are caused by carpal tunnel syndrome. Your carpal tunnel is a narrow, hollow space in your wrist. It passes between your wrist bones and a band of connective tissue (transverse carpal ligament). The nerve that supplies most of your hand (median nerve) passes through this space, and so do the connections between your fingers and the muscles of your arm (tendons). Carpal tunnel syndrome makes this space swell and become narrow, and this causes pain and numbness.  In carpal tunnel release surgery, a surgeon cuts through the transverse carpal ligament to make more room in the carpal tunnel space. You may have this surgery if other types of treatment have not worked.  LET YOUR HEALTH CARE PROVIDER KNOW ABOUT:  · Any allergies you have.  · All medicines you are taking, including vitamins, herbs, eye drops, creams, and over-the-counter medicines.  · Previous problems you or members of your family have had with the use of anesthetics.  · Any blood disorders you have.  · Previous surgeries you have had.  · Medical conditions you have.  RISKS AND COMPLICATIONS  Generally, this is a safe procedure. However, problems may occur, including:  · Bleeding.  · Infection.  · Injury to the median nerve.  · Need for additional surgery.  BEFORE THE PROCEDURE  · Ask your health care provider about:    Changing or stopping your regular medicines. This is especially important if you are taking diabetes medicines or blood thinners.    Taking medicines such as aspirin and ibuprofen. These medicines can thin your blood. Do not take these medicines before your procedure if your health care provider instructs you not to.  · Do not eat or drink anything after midnight on the night before the procedure or as directed by your health care provider.  · Plan to have someone take you home after the procedure.  PROCEDURE  · An IV tube may  be inserted into a vein.  · You will be given one of the following:    A medicine that numbs the wrist area (local anesthetic). You may also be given a medicine to make you relax (sedative).    A medicine that makes you go to sleep (general anesthetic).  · Your arm, hand, and wrist will be cleaned with a germ-killing solution (antiseptic).  · Your surgeon will make a surgical cut (incision) over the palm side of your wrist. The surgeon will pull aside the skin of your wrist to expose the carpal tunnel space.  · The surgeon will cut the transverse carpal ligament.  · The edges of the incision will be closed with stitches (sutures) or staples.  · A bandage (dressing) will be placed over your wrist and wrapped around your hand and wrist.  AFTER THE PROCEDURE  · You may spend some time in a recovery area.  · Your blood pressure, heart rate, breathing rate, and blood oxygen level will be monitored often until the medicines you were given have worn off.  · You will likely have some pain. You will be given pain medicine.  · You may need to wear a splint or a wrist brace over your dressing.     This information is not intended to replace advice given to you by your health care provider. Make sure you discuss any questions you have with your health care provider.     Document Released:   08/27/2003 Document Revised: 06/27/2014 Document Reviewed: 01/22/2014  Elsevier Interactive Patient Education ©2016 [employer]

## 2016-02-25 NOTE — Progress Notes (Signed)
Patient ID: Dennis Hopkins, male    DOB: 1961/01/14  Age: 55 y.o. MRN: [id number]    Subjective:  Subjective  HPI Dennis Hopkins presents for c/o pain in L hand and wrist from what is most likely carpal tunnel . He has been wearing the brace.     Review of Systems  Constitutional: Negative for appetite change, diaphoresis, fatigue and unexpected weight change.  Eyes: Negative for pain, redness and visual disturbance.  Respiratory: Negative for cough, chest tightness, shortness of breath and wheezing.   Cardiovascular: Negative for chest pain, palpitations and leg swelling.  Endocrine: Negative for cold intolerance, heat intolerance, polydipsia, polyphagia and polyuria.  Genitourinary: Negative for difficulty urinating, dysuria and frequency.  Neurological: Positive for weakness and numbness. Negative for dizziness, light-headedness and headaches.    History Past Medical History:  Diagnosis Date  . Chickenpox   . Hyperlipemia   . Hypertension   . Kidney stones 2013    He has a past surgical history that includes Tonsillectomy; Cardiac catheterization (N/A, 01/07/2016); Coronary artery bypass graft (N/A, 01/13/2016); and TEE without cardioversion (N/A, 01/13/2016).   His family history includes Arthritis in his father and mother; Diabetes in his brother; Hyperlipidemia in his father.He reports that he quit smoking about 2 years ago. His smoking use included Cigarettes. He has a 70.00 pack-year smoking history. He has never used smokeless tobacco. He reports that he drinks alcohol. He reports that he does not use drugs.  Current Outpatient Prescriptions on File Prior to Visit  Medication Sig Dispense Refill  . atorvastatin (LIPITOR) 80 MG tablet Take 1 tablet (80 mg total) by mouth daily at 6 PM. 30 tablet 1  . clopidogrel (PLAVIX) 75 MG tablet Take 1 tablet (75 mg total) by mouth daily. 90 tablet 3  . metFORMIN (GLUCOPHAGE) 500 MG tablet Take 1 tablet (500 mg total) by mouth 2 (two)  times daily with a meal. 60 tablet 1  . metoprolol (LOPRESSOR) 50 MG tablet Take 1 tablet (50 mg total) by mouth 2 (two) times daily. 60 tablet 1   No current facility-administered medications on file prior to visit.      Objective:  Objective  Physical Exam  Constitutional: He is oriented to person, place, and time. Vital signs are normal. He appears well-developed and well-nourished. He is sleeping.  HENT:  Head: Normocephalic and atraumatic.  Mouth/Throat: Oropharynx is clear and moist.  Eyes: EOM are normal. Pupils are equal, round, and reactive to light.  Neck: Normal range of motion. Neck supple. No thyromegaly present.  Cardiovascular: Normal rate and regular rhythm.   No murmur heard. Pulmonary/Chest: Effort normal and breath sounds normal. No respiratory distress. He has no wheezes. He has no rales. He exhibits no tenderness.  Musculoskeletal: He exhibits no edema or tenderness.  Neurological: He is alert and oriented to person, place, and time.  Skin: Skin is warm and dry.  Psychiatric: He has a normal mood and affect. His behavior is normal. Judgment and thought content normal.  Nursing note and vitals reviewed.  BP (!) 119/57 (BP Location: Right Arm, Patient Position: Sitting, Cuff Size: Large)   Pulse (!) 57   Temp 98.2 F (36.8 C) (Oral)   Resp 17   Ht 6' (1.829 m)   Wt 257 lb (116.6 kg)   SpO2 99%   BMI 34.86 kg/m  Wt Readings from Last 3 Encounters:  02/25/16 257 lb (116.6 kg)  02/24/16 256 lb 12.8 oz (116.5 kg)  02/15/16 255 lb (  115.7 kg)     Lab Results  Component Value Date   WBC 11.2 (H) 01/16/2016   HGB 10.2 (L) 01/16/2016   HCT 31.8 (L) 01/16/2016   PLT 189 01/16/2016   GLUCOSE 157 (H) 01/18/2016   CHOL 224 (H) 01/08/2016   TRIG 535 (H) 01/08/2016   HDL 30 (L) 01/08/2016   LDLDIRECT 161.0 09/03/2015   LDLCALC UNABLE TO CALCULATE IF TRIGLYCERIDE OVER 400 mg/dL 78/29/562107/21/2017   ALT 34 30/86/578407/21/2017   AST 25 01/08/2016   NA 133 (L) 01/18/2016   K  3.9 01/18/2016   CL 101 01/18/2016   CREATININE 0.95 01/18/2016   BUN 21 (H) 01/18/2016   CO2 23 01/18/2016   TSH 1.54 09/29/2014   INR 1.32 01/13/2016   HGBA1C 6.8 (H) 01/16/2016   MICROALBUR 1.1 09/29/2014    Dg Chest 2 View  Result Date: 02/15/2016 CLINICAL DATA:  CABG. EXAM: CHEST  2 VIEW COMPARISON:  01/17/2016. FINDINGS: Prior CABG. Cardiomegaly with normal pulmonary vascularity. Low lung volumes. Mild right base atelectasis and or infiltrate cannot be excluded. Small right pleural effusion noted. No pneumothorax. IMPRESSION: 1.  Prior CABG.  Stable cardiomegaly. 2. Lung volumes. Mild right base atelectasis and or infiltrate cannot be excluded. Small right pleural effusion. Electronically Signed   By: Dennis Fushomas  Hopkins   On: 02/15/2016 11:52     Assessment & Plan:  Plan  I have discontinued Dennis Hopkins's traMADol. I am also having him start on traMADol. Additionally, I am having him maintain his atorvastatin, metFORMIN, metoprolol, and clopidogrel.  Meds ordered this encounter  Medications  . traMADol (ULTRAM) 50 MG tablet    Sig: Take 1 tablet (50 mg total) by mouth every 8 (eight) hours as needed.    Dispense:  30 tablet    Refill:  0    Problem List Items Addressed This Visit    None    Visit Diagnoses    Carpal tunnel syndrome of left wrist    -  Primary   Relevant Medications   traMADol (ULTRAM) 50 MG tablet   Other Relevant Orders   Ambulatory referral to Orthopedic Surgery    con't to wear splint until appointment with ortho  Follow-up: Return if symptoms worsen or fail to improve.  Dennis SchultzYvonne R Lowne Chase, DO

## 2016-02-25 NOTE — Progress Notes (Signed)
Pre visit review using our clinic review tool, if applicable. No additional management support is needed unless otherwise documented below in the visit note. 

## 2016-02-26 ENCOUNTER — Other Ambulatory Visit: Payer: BLUE CROSS/BLUE SHIELD

## 2016-02-28 ENCOUNTER — Encounter: Payer: Self-pay | Admitting: Family Medicine

## 2016-03-17 ENCOUNTER — Encounter (HOSPITAL_COMMUNITY)
Admission: RE | Admit: 2016-03-17 | Discharge: 2016-03-17 | Disposition: A | Discharge status: Home / Self Care | Payer: BLUE CROSS/BLUE SHIELD | Source: Ambulatory Visit | Attending: Cardiology | Admitting: Cardiology

## 2016-03-17 ENCOUNTER — Other Ambulatory Visit: Payer: Self-pay | Admitting: Surgical

## 2016-03-17 ENCOUNTER — Encounter (HOSPITAL_COMMUNITY): Payer: Self-pay

## 2016-03-17 VITALS — BP 138/80 | HR 70 | Ht 70.0 in | Wt 263.0 lb

## 2016-03-17 DIAGNOSIS — Z951 Presence of aortocoronary bypass graft: Secondary | ICD-10-CM | POA: Insufficient documentation

## 2016-03-17 NOTE — Progress Notes (Signed)
Cardiac Rehab Medication Review by a Pharmacist  Does the patient  feel that his/her medications are working for him/her?  yes  Has the patient been experiencing any side effects to the medications prescribed?  no  Does the patient measure his/her own blood pressure or blood glucose at home?  no   Does the patient have any problems obtaining medications due to transportation or finances?   no  Understanding of regimen: good Understanding of indications: good Potential of compliance: good    Pharmacist comments: Mr Dennis Hopkins is a pleasant, energetic 55 yo male who presents to cardiac rehab without assistance. Overall, he has a good understanding of what his medications are used for and why. We discussed the importance of monitoring his blood sugar and blood pressure at home, and keeping logs of those to better assess the effectiveness of his medications. He understands and agrees with his medication regimen.  Allie BossierApryl Samone Hopkins, PharmD PGY1 Pharmacy Resident [phone] (Pager) 03/17/2016 8:56 AM

## 2016-03-17 NOTE — Progress Notes (Signed)
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  atorvastatin (LIPITOR) 80 MG tablet, Take 1 tablet (80 mg total) by mouth daily at 6 PM., Disp: 30 tablet, Rfl: 1 .  clopidogrel (PLAVIX) 75 MG tablet, Take 1 tablet (75 mg total) by mouth daily., Disp: 90 tablet, Rfl: 3 .  metFORMIN (GLUCOPHAGE) 500 MG tablet, Take 1 tablet (500 mg total) by mouth 2 (two) times daily with a meal., Disp: 60 tablet, Rfl: 1 .  metoprolol (LOPRESSOR) 50 MG tablet, Take 1 tablet (50 mg total) by mouth 2 (two) times daily., Disp: 60 tablet, Rfl: 1 .  traMADol (ULTRAM) 50 MG tablet, Take 1 tablet (50 mg total) by mouth every 8 (eight) hours as needed., Disp: 30 tablet, Rfl: 0  Past Medical History: Past Medical History:  Diagnosis Date  . Chickenpox   . Hyperlipemia   . Hypertension   . Kidney stones 2013    Tobacco Use: History  Smoking Status  . Former Smoker  . Packs/day: 2.00  . Years: 35.00  . Types: Cigarettes  . Quit date: 12/21/2013  Smokeless Tobacco  . Never Used    Labs: Recent Review Flowsheet Data    Labs for ITP Cardiac and Pulmonary Rehab Latest Ref Rng & Units 01/13/2016 01/13/2016 01/13/2016 01/14/2016 01/16/2016   Cholestrol 0 - 200 mg/dL - - - - -   LDLCALC 0 - 99 mg/dL - - - - -   LDLDIRECT mg/dL - - - - -   HDL >40 mg/dL - - - - -   Trlycerides <150 mg/dL - - - - -   Hemoglobin A1c 4.8 - 5.6 % - - - - 6.8(H)   PHART  7.350 - 7.450 7.334(L) 7.324(L) - - -   PCO2ART 35.0 - 45.0 mmHg 43.0 45.0 - - -   HCO3 20.0 - 24.0 mEq/L 22.9 23.3 - - -   TCO2 0 - 100 mmol/L 24 25 24 28  -   ACIDBASEDEF 0.0 - 2.0 mmol/L 3.0(H) 3.0(H) - - -   O2SAT % 99.0 99.0 - - -      Capillary Blood Glucose: Lab Results  Component Value Date   GLUCAP 130 (H) 01/18/2016   GLUCAP 125 (H) 01/17/2016   GLUCAP 135 (H) 01/17/2016   GLUCAP 174 (H) 01/17/2016   GLUCAP 120 (H) 01/17/2016     Exercise Target Goals: Date: 03/17/16  Exercise Program Goal: Individual exercise prescription set with THRR, safety & activity barriers. Participant demonstrates ability to understand and report RPE using BORG scale, to self-measure pulse accurately, and to acknowledge the importance of the exercise prescription.  Exercise Prescription Goal: Starting with aerobic activity 30 plus minutes a day, 3 days per week for initial exercise prescription. Provide home exercise prescription and guidelines that participant acknowledges understanding prior to discharge.  Activity Barriers & Risk Stratification:     Activity Barriers & Cardiac Risk Stratification - 03/17/16 9811  Activity Barriers & Cardiac Risk Stratification   Activity Barriers None   Cardiac Risk Stratification High      6 Minute Walk:     6 Minute Walk    Row Name 03/17/16 1433         6 Minute Walk   Phase Initial     Distance 1837 feet     Walk Time 6 minutes     # of Rest Breaks 0     MPH 3.47     METS 4.2     RPE 11     VO2 Peak 14.76     Symptoms No     Resting HR 70 bpm     Resting BP 138/80     Max Ex. HR 109 bpm     Max Ex. BP 140/78     2 Minute Post BP 122/78        Initial Exercise Prescription:     Initial Exercise Prescription - 03/17/16 1400      Date of Initial Exercise RX and Referring Provider   Date 03/17/16   Referring Provider Olga Millers, MD     Bike   Level 1   Minutes 10   METs 2.57     NuStep   Level 3   Minutes  10   METs 2     Track   Laps 11   Minutes 10   METs 2.92     Prescription Details   Frequency (times per week) 3   Duration Progress to 30 minutes of continuous aerobic without signs/symptoms of physical distress     Intensity   THRR 40-80% of Max Heartrate 66-132   Ratings of Perceived Exertion 11-13   Perceived Dyspnea 0-4     Progression   Progression Continue to progress workloads to maintain intensity without signs/symptoms of physical distress.     Resistance Training   Training Prescription Yes   Weight 2lbs   Reps 10-12      Perform Capillary Blood Glucose checks as needed.  Exercise Prescription Changes:   Exercise Comments:   Discharge Exercise Prescription (Final Exercise Prescription Changes):   Nutrition:  Target Goals: Understanding of nutrition guidelines, daily intake of sodium 1500mg , cholesterol 200mg , calories 30% from fat and 7% or less from saturated fats, daily to have 5 or more servings of fruits and vegetables.  Biometrics:     Pre Biometrics - 03/17/16 1434      Pre Biometrics   Waist Circumference 49 inches   Hip Circumference 42.25 inches   Waist to Hip Ratio 1.16 %   Triceps Skinfold 28 mm   % Body Fat 37.4 %   Grip Strength 49.5 kg   Flexibility 8.75 in   Single Leg Stand 30 seconds       Nutrition Therapy Plan and Nutrition Goals:   Nutrition Discharge: Nutrition Scores:   Nutrition Goals Re-Evaluation:   Psychosocial: Target Goals: Acknowledge presence or absence of depression, maximize coping skills, provide positive support system. Participant is able to verbalize types and ability to use techniques and skills needed for reducing stress and depression.  Initial Review & Psychosocial Screening:     Initial Psych Review & Screening - 03/17/16 1505      Initial Review   Current issues with Current Stress Concerns   Source of Stress Concerns Occupation   Comments pt reports having work related stress.      Family Dynamics   Comments Pt cares for wife who has  Barriers   Psychosocial barriers to participate in program The patient should benefit from training in stress management and relaxation.     Screening Interventions   Interventions Encouraged to exercise      Quality of Life Scores:     Quality of Life - 03/17/16 1434      Quality of Life Scores   Health/Function Pre 23 %   Socioeconomic Pre 22.07 %   Psych/Spiritual Pre 22.14 %   Family Pre 24 %   GLOBAL Pre 22.78 %      PHQ-9: Recent Review Flowsheet Data    There is no flowsheet data to display.      Psychosocial Evaluation and Intervention:   Psychosocial Re-Evaluation:   Vocational Rehabilitation: Provide vocational rehab assistance to qualifying candidates.   Vocational Rehab Evaluation & Intervention:     Vocational Rehab - 03/17/16 1518      Initial Vocational Rehab Evaluation & Intervention   Assessment shows need for Vocational Rehabilitation No  Pt reports he may return back to work without any difficulty      Education: Education Goals: Education classes will be provided on a weekly basis, covering required topics. Participant will state understanding/return demonstration of topics presented.  Learning Barriers/Preferences:     Learning Barriers/Preferences - 03/17/16 0913      Learning Barriers/Preferences   Learning Barriers Sight  reading glasses   Learning Preferences Pictoral;Skilled Demonstration      Education Topics: Count Your Pulse:  -Group instruction provided by verbal instruction, demonstration, patient participation and written materials to support subject.  Instructors address importance of being able to find your pulse and how to count your pulse when at home without a heart monitor.  Patients get hands on experience counting their pulse with staff help and individually.   Heart Attack, Angina, and Risk Factor Modification:  -Group instruction provided by  verbal instruction, video, and written materials to support subject.  Instructors address signs and symptoms of angina and heart attacks.    Also discuss risk factors for heart disease and how to make changes to improve heart health risk factors.   Functional Fitness:  -Group instruction provided by verbal instruction, demonstration, patient participation, and written materials to support subject.  Instructors address safety measures for doing things around the house.  Discuss how to get up and down off the floor, how to pick things up properly, how to safely get out of a chair without assistance, and balance training.   Meditation and Mindfulness:  -Group instruction provided by verbal instruction, patient participation, and written materials to support subject.  Instructor addresses importance of mindfulness and meditation practice to help reduce stress and improve awareness.  Instructor also leads participants through a meditation exercise.    Stretching for Flexibility and Mobility:  -Group instruction provided by verbal instruction, patient participation, and written materials to support subject.  Instructors lead participants through series of stretches that are designed to increase flexibility thus improving mobility.  These stretches are additional exercise for major muscle groups that are typically performed during regular warm up and cool down.   Hands Only CPR Anytime:  -Group instruction provided by verbal instruction, video, patient participation and written materials to support subject.  Instructors co-teach with AHA video for hands only CPR.  Participants get hands on experience with mannequins.   Nutrition I class: Heart Healthy Eating:  -Group instruction provided by PowerPoint slides, verbal discussion, and written materials to support subject matter. The instructor gives an explanation and  review of the Therapeutic Lifestyle Changes diet recommendations, which includes a  discussion on lipid goals, dietary fat, sodium, fiber, plant stanol/sterol esters, sugar, and the components of a well-balanced, healthy diet.   Nutrition II class: Lifestyle Skills:  -Group instruction provided by PowerPoint slides, verbal discussion, and written materials to support subject matter. The instructor gives an explanation and review of label reading, grocery shopping for heart health, heart healthy recipe modifications, and ways to make healthier choices when eating out.   Diabetes Question & Answer:  -Group instruction provided by PowerPoint slides, verbal discussion, and written materials to support subject matter. The instructor gives an explanation and review of diabetes co-morbidities, pre- and post-prandial blood glucose goals, pre-exercise blood glucose goals, signs, symptoms, and treatment of hypoglycemia and hyperglycemia, and foot care basics.   Diabetes Blitz:  -Group instruction provided by PowerPoint slides, verbal discussion, and written materials to support subject matter. The instructor gives an explanation and review of the physiology behind type 1 and type 2 diabetes, diabetes medications and rational behind using different medications, pre- and post-prandial blood glucose recommendations and Hemoglobin A1c goals, diabetes diet, and exercise including blood glucose guidelines for exercising safely.    Portion Distortion:  -Group instruction provided by PowerPoint slides, verbal discussion, written materials, and food models to support subject matter. The instructor gives an explanation of serving size versus portion size, changes in portions sizes over the last 20 years, and what consists of a serving from each food group.   Stress Management:  -Group instruction provided by verbal instruction, video, and written materials to support subject matter.  Instructors review role of stress in heart disease and how to cope with stress positively.     Exercising on Your  Own:  -Group instruction provided by verbal instruction, power point, and written materials to support subject.  Instructors discuss benefits of exercise, components of exercise, frequency and intensity of exercise, and end points for exercise.  Also discuss use of nitroglycerin and activating EMS.  Review options of places to exercise outside of rehab.  Review guidelines for sex with heart disease.   Cardiac Drugs I:  -Group instruction provided by verbal instruction and written materials to support subject.  Instructor reviews cardiac drug classes: antiplatelets, anticoagulants, beta blockers, and statins.  Instructor discusses reasons, side effects, and lifestyle considerations for each drug class.   Cardiac Drugs II:  -Group instruction provided by verbal instruction and written materials to support subject.  Instructor reviews cardiac drug classes: angiotensin converting enzyme inhibitors (ACE-I), angiotensin II receptor blockers (ARBs), nitrates, and calcium channel blockers.  Instructor discusses reasons, side effects, and lifestyle considerations for each drug class.   Anatomy and Physiology of the Circulatory System:  -Group instruction provided by verbal instruction, video, and written materials to support subject.  Reviews functional anatomy of heart, how it relates to various diagnoses, and what role the heart plays in the overall system.   Knowledge Questionnaire Score:     Knowledge Questionnaire Score - 03/17/16 1432      Knowledge Questionnaire Score   Pre Score 22/24      Core Components/Risk Factors/Patient Goals at Admission:     Personal Goals and Risk Factors at Admission - 03/17/16 0916      Core Components/Risk Factors/Patient Goals on Admission    Weight Management Yes;Obesity;Weight Loss   Intervention Weight Management: Develop a combined nutrition and exercise program designed to reach desired caloric intake, while maintaining appropriate intake of nutrient  and fiber, sodium  and fats, and appropriate energy expenditure required for the weight goal.;Weight Management: Provide education and appropriate resources to help participant work on and attain dietary goals.;Weight Management/Obesity: Establish reasonable short term and long term weight goals.;Obesity: Provide education and appropriate resources to help participant work on and attain dietary goals.   Expected Outcomes Short Term: Continue to assess and modify interventions until short term weight is achieved;Long Term: Adherence to nutrition and physical activity/exercise program aimed toward attainment of established weight goal;Weight Maintenance: Understanding of the daily nutrition guidelines, which includes 25-35% calories from fat, 7% or less cal from saturated fats, less than 200mg  cholesterol, less than 1.5gm of sodium, & 5 or more servings of fruits and vegetables daily;Weight Loss: Understanding of general recommendations for a balanced deficit meal plan, which promotes 1-2 lb weight loss per week and includes a negative energy balance of [phone] kcal/d;Understanding recommendations for meals to include 15-35% energy as protein, 25-35% energy from fat, 35-60% energy from carbohydrates, less than 200mg  of dietary cholesterol, 20-35 gm of total fiber daily;Understanding of distribution of calorie intake throughout the day with the consumption of 4-5 meals/snacks   Increase Strength and Stamina Yes   Intervention Provide advice, education, support and counseling about physical activity/exercise needs.;Develop an individualized exercise prescription for aerobic and resistive training based on initial evaluation findings, risk stratification, comorbidities and participant's personal goals.   Expected Outcomes Achievement of increased cardiorespiratory fitness and enhanced flexibility, muscular endurance and strength shown through measurements of functional capacity and personal statement of participant.    Improve shortness of breath with ADL's Yes   Intervention Provide education, individualized exercise plan and daily activity instruction to help decrease symptoms of SOB with activities of daily living.   Expected Outcomes Short Term: Achieves a reduction of symptoms when performing activities of daily living.   Diabetes Yes   Intervention Provide education about signs/symptoms and action to take for hypo/hyperglycemia.;Provide education about proper nutrition, including hydration, and aerobic/resistive exercise prescription along with prescribed medications to achieve blood glucose in normal ranges: Fasting glucose 65-99 mg/dL   Expected Outcomes Short Term: Participant verbalizes understanding of the signs/symptoms and immediate care of hyper/hypoglycemia, proper foot care and importance of medication, aerobic/resistive exercise and nutrition plan for blood glucose control.;Long Term: Attainment of HbA1C < 7%.   Hypertension Yes   Intervention Provide education on lifestyle modifcations including regular physical activity/exercise, weight management, moderate sodium restriction and increased consumption of fresh fruit, vegetables, and low fat dairy, alcohol moderation, and smoking cessation.;Monitor prescription use compliance.   Expected Outcomes Short Term: Continued assessment and intervention until BP is < 140/62mm HG in hypertensive participants. < 130/72mm HG in hypertensive participants with diabetes, heart failure or chronic kidney disease.;Long Term: Maintenance of blood pressure at goal levels.   Lipids Yes   Intervention Provide education and support for participant on nutrition & aerobic/resistive exercise along with prescribed medications to achieve LDL 70mg , HDL >40mg .   Expected Outcomes Short Term: Participant states understanding of desired cholesterol values and is compliant with medications prescribed. Participant is following exercise prescription and nutrition guidelines.;Long  Term: Cholesterol controlled with medications as prescribed, with individualized exercise RX and with personalized nutrition plan. Value goals: LDL < 70mg , HDL > 40 mg.   Personal Goal Other Yes   Personal Goal Get back to work, improve lung capacity and lose wt ( short: 10lbs in 6 weeks and long: 40lbs in 4 months)   Intervention Provide nutrition and exercise counseling to improve cardiovascular fitness and assist  with weightloss   Expected Outcomes Pt will be able to return to work, improve fitness levels and lose weight      Core Components/Risk Factors/Patient Goals Review:    Core Components/Risk Factors/Patient Goals at Discharge (Final Review):    ITP Comments:     ITP Comments    Row Name 03/17/16 0912           ITP Comments Dr. Armanda Magic, Medical Director          Comments:  Pt in for cardiac rehab orientation appointment from 0800-1030. Pt is accompanied by his wife who is in a wheelchair due to recent back issues.  As a part of the orientation appt, pt completed 6 minute walk test.  Pt tolerated very well with no complaints of cp or sob. Pt remarked that he walks 5 miles a day.  Monitor shows Sr with no noted ectopy. Pt asked about returning back to work.  Advised pt that he would need to get medical clearance from his surgeon - Dr. Dorris Fetch.  Pt and wife verbalized understanding. Pt is looking forward to starting full exercise on next Monday. Alanson Aly, BSN

## 2016-03-18 ENCOUNTER — Other Ambulatory Visit: Payer: Self-pay

## 2016-03-18 MED ORDER — ATORVASTATIN CALCIUM 80 MG PO TABS: 80.0000 mg | ORAL_TABLET | Freq: Every day | ORAL | 10 refills | Status: DC

## 2016-03-18 MED ORDER — METOPROLOL TARTRATE 50 MG PO TABS: 50.0000 mg | ORAL_TABLET | Freq: Two times a day (BID) | ORAL | 10 refills | Status: DC

## 2016-03-21 ENCOUNTER — Encounter (HOSPITAL_COMMUNITY): Payer: Self-pay

## 2016-03-21 ENCOUNTER — Other Ambulatory Visit: Payer: Self-pay | Admitting: Family Medicine

## 2016-03-21 ENCOUNTER — Other Ambulatory Visit: Payer: Self-pay

## 2016-03-21 ENCOUNTER — Telehealth (HOSPITAL_COMMUNITY): Payer: Self-pay | Admitting: Cardiac Rehabilitation

## 2016-03-21 ENCOUNTER — Encounter (HOSPITAL_COMMUNITY)
Admission: RE | Admit: 2016-03-21 | Discharge: 2016-03-21 | Disposition: A | Discharge status: Home / Self Care | Payer: BLUE CROSS/BLUE SHIELD | Source: Ambulatory Visit | Attending: Cardiology | Admitting: Cardiology

## 2016-03-21 DIAGNOSIS — IMO0002 Reserved for concepts with insufficient information to code with codable children: Secondary | ICD-10-CM

## 2016-03-21 DIAGNOSIS — Z951 Presence of aortocoronary bypass graft: Secondary | ICD-10-CM | POA: Diagnosis present

## 2016-03-21 DIAGNOSIS — E1151 Type 2 diabetes mellitus with diabetic peripheral angiopathy without gangrene: Secondary | ICD-10-CM

## 2016-03-21 DIAGNOSIS — E1165 Type 2 diabetes mellitus with hyperglycemia: Principal | ICD-10-CM

## 2016-03-21 LAB — GLUCOSE, CAPILLARY
Glucose-Capillary: 170 mg/dL — ABNORMAL HIGH (ref 65–99)
Glucose-Capillary: 121 mg/dL — ABNORMAL HIGH (ref 65–99)

## 2016-03-21 MED ORDER — METFORMIN HCL 500 MG PO TABS: 500.0000 mg | ORAL_TABLET | Freq: Two times a day (BID) | ORAL | 2 refills | Status: DC

## 2016-03-21 MED ORDER — METFORMIN HCL 500 MG PO TABS: 500.0000 mg | ORAL_TABLET | Freq: Two times a day (BID) | ORAL | 1 refills | Status: DC

## 2016-03-21 NOTE — Progress Notes (Addendum)
Daily Session Note  Patient Details  Name: Dennis Hopkins MRN: [id number] Date of Birth: May 18, 1961 Referring Provider:   Flowsheet Row CARDIAC REHAB PHASE II ORIENTATION from 03/17/2016 in [place]  Referring Provider  Kirk Ruths, MD      Encounter Date: 03/21/2016  Check In:     Session Check In - 03/21/16 0905      Check-In   Location [facility]   Staff Present Dorna Bloom, MS, ACSM RCEP, Exercise Physiologist;Kelbi Renstrom, RN, Clinical cytogeneticist, RN, [facility]zo, MS, ACSM RCEP, Exercise Physiologist;Olinty Krebs, MS, ACSM CEP, Exercise Physiologist   Supervising physician immediately available to respond to emergencies [facility] immediately available   Physician(s) Dr. Maylene Roes   Medication changes reported     No   Fall or balance concerns reported    No   Warm-up and Cool-down Performed as group-led instruction   Resistance Training Performed Yes   VAD Patient? No     Pain Assessment   Currently in Pain? No/denies   Multiple Pain Sites No      Capillary Blood Glucose: Results for orders placed or performed during the hospital encounter of 03/21/16 (from the past 24 hour(s))  Glucose, capillary     Status: Abnormal   Collection Time: 03/21/16  8:23 AM  Result Value Ref Range   Glucose-Capillary 170 (H) 65 - 99 mg/dL  Glucose, capillary     Status: Abnormal   Collection Time: 03/21/16  9:33 AM  Result Value Ref Range   Glucose-Capillary 121 (H) 65 - 99 mg/dL     Goals Met:  Exercise tolerated well  Goals Unmet:  Not Applicable  Comments: Pt started cardiac rehab today.  Pt tolerated light exercise without difficulty. VSS, telemetry-sinus rhythm,  asymptomatic.  Medication list reconciled. Pt denies barriers to medicaiton compliance.  PSYCHOSOCIAL ASSESSMENT:  PHQ-0. Pt exhibits positive coping skills, hopeful outlook with supportive family. No psychosocial needs identified at this time, no  psychosocial interventions necessary.    Pt admits he does not have much pleasurable free time, since he often works 60-70 hours per week.  Pt goals for cardiac rehab are to lose weight and decrease dyspnea on exertion.  Pt reports he does not refill of metformin.  Pt is not sure if he should continue taking since his last HgbA1C -6.1 at work health fair. Message sent to Dr. Etter Sjogren his PCP for clarification.    Pt oriented to exercise equipment and routine.    Understanding verbalized.   Dr. Fransico Him is Medical Director for Cardiac Rehab at [facility].

## 2016-03-21 NOTE — Telephone Encounter (Signed)
-----   Message from Donato SchultzYvonne R Lowne Chase, DO sent at 03/21/2016 12:51 PM EDT ----- Regarding: RE: cardiac rehab  He does need to keep taking the metformin--- I will send a refill in.   Thanks.   Dr Laury AxonLowne ----- Message ----- From: Robyne PeersJoann H Kiowa Peifer, RN Sent: 03/21/2016  11:55 AM To: Donato SchultzYvonne R Lowne Chase, DO Subject: cardiac rehab                                  Dear Dr. Laury AxonLowne,  Pt started cardiac rehab today.  Upon med list reconciliation,  pt reports he does not have refill for Metformin, therefore he thinks he should stop taking it.  He reports his most recent Hgb A1C was 6.1 last week  at a work health fair.  His post prandial CBG -170 today at cardiac rehab, with post exercise CBG-124.   If you would like pt to continue metformin [facility]   He is currently taking metformin 500mg  BID.   Please advise.   Thank you, Deveron FurlongJoann Dorthia Tout, RN, BSN Cardiac Pulmonary Rehab

## 2016-03-23 ENCOUNTER — Encounter (HOSPITAL_COMMUNITY)
Admission: RE | Admit: 2016-03-23 | Discharge: 2016-03-23 | Disposition: A | Discharge status: Home / Self Care | Payer: BLUE CROSS/BLUE SHIELD | Source: Ambulatory Visit | Attending: Cardiology | Admitting: Cardiology

## 2016-03-23 DIAGNOSIS — Z951 Presence of aortocoronary bypass graft: Secondary | ICD-10-CM

## 2016-03-23 LAB — GLUCOSE, CAPILLARY
Glucose-Capillary: 135 mg/dL — ABNORMAL HIGH (ref 65–99)
Glucose-Capillary: 153 mg/dL — ABNORMAL HIGH (ref 65–99)

## 2016-03-25 ENCOUNTER — Encounter (HOSPITAL_COMMUNITY)
Admission: RE | Admit: 2016-03-25 | Discharge: 2016-03-25 | Disposition: A | Discharge status: Home / Self Care | Payer: BLUE CROSS/BLUE SHIELD | Source: Ambulatory Visit | Attending: Cardiology | Admitting: Cardiology

## 2016-03-25 DIAGNOSIS — Z951 Presence of aortocoronary bypass graft: Secondary | ICD-10-CM | POA: Diagnosis not present

## 2016-03-25 LAB — GLUCOSE, CAPILLARY: Glucose-Capillary: 117 mg/dL — ABNORMAL HIGH (ref 65–99)

## 2016-03-28 ENCOUNTER — Encounter (HOSPITAL_COMMUNITY)
Admission: RE | Admit: 2016-03-28 | Discharge: 2016-03-28 | Disposition: A | Discharge status: Home / Self Care | Payer: BLUE CROSS/BLUE SHIELD | Source: Ambulatory Visit | Attending: Cardiology | Admitting: Cardiology

## 2016-03-28 DIAGNOSIS — Z951 Presence of aortocoronary bypass graft: Secondary | ICD-10-CM | POA: Diagnosis not present

## 2016-03-28 LAB — GLUCOSE, CAPILLARY: Glucose-Capillary: 126 mg/dL — ABNORMAL HIGH (ref 65–99)

## 2016-03-28 NOTE — Progress Notes (Signed)
Reviewed home exercise with pt today.  Pt plans to walk  for exercise.  Reviewed THR, pulse, RPE, sign and symptoms, and when to call 911 or MD.  Also discussed weather considerations and indoor options.  Pt voiced understanding.    Airanna Partin,MS,ACSM RCEP 

## 2016-03-30 ENCOUNTER — Encounter (HOSPITAL_COMMUNITY)
Admission: RE | Admit: 2016-03-30 | Discharge: 2016-03-30 | Disposition: A | Discharge status: Home / Self Care | Payer: BLUE CROSS/BLUE SHIELD | Source: Ambulatory Visit | Attending: Cardiology | Admitting: Cardiology

## 2016-03-30 DIAGNOSIS — Z951 Presence of aortocoronary bypass graft: Secondary | ICD-10-CM

## 2016-04-01 ENCOUNTER — Encounter (HOSPITAL_COMMUNITY)
Admission: RE | Admit: 2016-04-01 | Discharge: 2016-04-01 | Disposition: A | Discharge status: Home / Self Care | Payer: BLUE CROSS/BLUE SHIELD | Source: Ambulatory Visit | Attending: Cardiology | Admitting: Cardiology

## 2016-04-01 DIAGNOSIS — Z951 Presence of aortocoronary bypass graft: Secondary | ICD-10-CM | POA: Diagnosis not present

## 2016-04-01 LAB — GLUCOSE, CAPILLARY: Glucose-Capillary: 144 mg/dL — ABNORMAL HIGH (ref 65–99)

## 2016-04-01 NOTE — Progress Notes (Signed)
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  atorvastatin (LIPITOR) 80 MG tablet, Take 1 tablet (80 mg total) by mouth daily., Disp: 30 tablet, Rfl: 10 .  clopidogrel (PLAVIX) 75 MG tablet, Take 1 tablet (75 mg total) by mouth daily., Disp: 90 tablet, Rfl: 3 .  metFORMIN (GLUCOPHAGE) 500 MG tablet, Take 1 tablet (500 mg total) by mouth 2 (two) times daily with a meal., Disp: 60 tablet, Rfl: 2 .  metoprolol (LOPRESSOR) 50 MG tablet, Take 1 tablet (50 mg total) by mouth 2 (two) times daily., Disp: 60 tablet, Rfl: 10 .  traMADol (ULTRAM) 50 MG tablet, Take 1 tablet (50 mg total) by mouth every 8 (eight) hours as needed., Disp: 30 tablet, Rfl: 0  Past Medical History: Past Medical History:  Diagnosis Date  . Chickenpox   . Hyperlipemia   . Hypertension   . Kidney stones 2013    Tobacco Use: History  Smoking Status  . Former Smoker  . Packs/day: 2.00  . Years: 35.00  . Types: Cigarettes  . Quit date: 12/21/2013  Smokeless Tobacco  . Never Used    Labs: Recent Review Flowsheet Data    Labs for ITP Cardiac and Pulmonary Rehab Latest Ref Rng & Units 01/13/2016 01/13/2016 01/13/2016 01/14/2016 01/16/2016   Cholestrol 0 - 200 mg/dL - - - - -   LDLCALC 0 - 99 mg/dL - - - - -   LDLDIRECT mg/dL - - - - -   HDL >40 mg/dL - - - - -   Trlycerides <150 mg/dL - - - - -   Hemoglobin A1c 4.8 - 5.6 % - - - - 6.8(H)   PHART 7.350 -  7.450 7.334(L) 7.324(L) - - -   PCO2ART 35.0 - 45.0 mmHg 43.0 45.0 - - -   HCO3 20.0 - 24.0 mEq/L 22.9 23.3 - - -   TCO2 0 - 100 mmol/L 24 25 24 28  -   ACIDBASEDEF 0.0 - 2.0 mmol/L 3.0(H) 3.0(H) - - -   O2SAT % 99.0 99.0 - - -      Capillary Blood Glucose: Lab Results  Component Value Date   GLUCAP 126 (H) 03/28/2016   GLUCAP 117 (H) 03/25/2016   GLUCAP 153 (H) 03/23/2016   GLUCAP 135 (H) 03/23/2016   GLUCAP 121 (H) 03/21/2016     Exercise Target Goals:    Exercise Program Goal: Individual exercise prescription set with THRR, safety & activity barriers. Participant demonstrates ability to understand and report RPE using BORG scale, to self-measure pulse accurately, and to acknowledge the importance of the exercise prescription.  Exercise Prescription Goal: Starting with aerobic activity 30 plus minutes a day, 3 days per week for initial exercise prescription. Provide home exercise prescription and guidelines that participant acknowledges understanding prior to discharge.  Activity Barriers & Risk Stratification:     Activity Barriers & Cardiac Risk Stratification - 03/17/16 0913      Activity  Barriers & Cardiac Risk Stratification   Activity Barriers None   Cardiac Risk Stratification High      6 Minute Walk:     6 Minute Walk    Row Name 03/17/16 1433         6 Minute Walk   Phase Initial     Distance 1837 feet     Walk Time 6 minutes     # of Rest Breaks 0     MPH 3.47     METS 4.2     RPE 11     VO2 Peak 14.76     Symptoms No     Resting HR 70 bpm     Resting BP 138/80     Max Ex. HR 109 bpm     Max Ex. BP 140/78     2 Minute Post BP 122/78        Initial Exercise Prescription:     Initial Exercise Prescription - 03/17/16 1400      Date of Initial Exercise RX and Referring Provider   Date 03/17/16   Referring Provider Kirk Ruths, MD     Bike   Level 1   Minutes 10   METs 2.57     NuStep   Level 3   Minutes 10   METs 2      Track   Laps 11   Minutes 10   METs 2.92     Prescription Details   Frequency (times per week) 3   Duration Progress to 30 minutes of continuous aerobic without signs/symptoms of physical distress     Intensity   THRR 40-80% of Max Heartrate 66-132   Ratings of Perceived Exertion 11-13   Perceived Dyspnea 0-4     Progression   Progression Continue to progress workloads to maintain intensity without signs/symptoms of physical distress.     Resistance Training   Training Prescription Yes   Weight 2lbs   Reps 10-12      Perform Capillary Blood Glucose checks as needed.  Exercise Prescription Changes:     Exercise Prescription Changes    Row Name 03/30/16 1100             Exercise Review   Progression Yes         Response to Exercise   Blood Pressure (Admit) 148/82       Blood Pressure (Exercise) 148/88       Blood Pressure (Exit) 106/74       Heart Rate (Admit) 722 bpm       Heart Rate (Exercise) 104 bpm       Heart Rate (Exit) 72 bpm       Rating of Perceived Exertion (Exercise) 12       Comments reviewed HEP on 03/28/16         Progression   Average METs 2.9         Resistance Training   Training Prescription Yes       Weight 3lbs       Reps 10-12         Bike   Level 1       Minutes 10       METs 2.57         NuStep   Level 3       Minutes 10       METs 3.2         Track   Laps 18  Minutes 10       METs 4.14         Home Exercise Plan   Plans to continue exercise at Home  reviewed HEP on 03/28/16       Frequency Add 2 additional days to program exercise sessions.          Exercise Comments:   Discharge Exercise Prescription (Final Exercise Prescription Changes):     Exercise Prescription Changes - 03/30/16 1100      Exercise Review   Progression Yes     Response to Exercise   Blood Pressure (Admit) 148/82   Blood Pressure (Exercise) 148/88   Blood Pressure (Exit) 106/74   Heart Rate (Admit) 722 bpm   Heart Rate  (Exercise) 104 bpm   Heart Rate (Exit) 72 bpm   Rating of Perceived Exertion (Exercise) 12   Comments reviewed HEP on 03/28/16     Progression   Average METs 2.9     Resistance Training   Training Prescription Yes   Weight 3lbs   Reps 10-12     Bike   Level 1   Minutes 10   METs 2.57     NuStep   Level 3   Minutes 10   METs 3.2     Track   Laps 18   Minutes 10   METs 4.14     Home Exercise Plan   Plans to continue exercise at Home  reviewed HEP on 03/28/16   Frequency Add 2 additional days to program exercise sessions.      Nutrition:  Target Goals: Understanding of nutrition guidelines, daily intake of sodium <1552m, cholesterol <2057m calories 30% from fat and 7% or less from saturated fats, daily to have 5 or more servings of fruits and vegetables.  Biometrics:     Pre Biometrics - 03/17/16 1434      Pre Biometrics   Waist Circumference 49 inches   Hip Circumference 42.25 inches   Waist to Hip Ratio 1.16 %   Triceps Skinfold 28 mm   % Body Fat 37.4 %   Grip Strength 49.5 kg   Flexibility 8.75 in   Single Leg Stand 30 seconds       Nutrition Therapy Plan and Nutrition Goals:     Nutrition Therapy & Goals - 03/21/16 1052      Nutrition Therapy   Diet Carb Modified, Therapeutic Lifestyle Change     Personal Nutrition Goals   Personal Goal #1 1-2 lb wt loss/week to a wt loss goal of 6-24 lb     Intervention Plan   Intervention Prescribe, educate and counsel regarding individualized specific dietary modifications aiming towards targeted core components such as weight, hypertension, lipid management, diabetes, heart failure and other comorbidities.   Expected Outcomes Short Term Goal: Understand basic principles of dietary content, such as calories, fat, sodium, cholesterol and nutrients.;Long Term Goal: Adherence to prescribed nutrition plan.      Nutrition Discharge: Nutrition Scores:     Nutrition Assessments - 03/21/16 1052      MEDFICTS  Scores   Pre Score 41      Nutrition Goals Re-Evaluation:   Psychosocial: Target Goals: Acknowledge presence or absence of depression, maximize coping skills, provide positive support system. Participant is able to verbalize types and ability to use techniques and skills needed for reducing stress and depression.  Initial Review & Psychosocial Screening:     Initial Psych Review & Screening - 03/21/16 1050      Initial Review  Current issues with Current Stress Concerns   Source of Stress Concerns Occupation   Comments pt reports having work related stress.     Family Dynamics   Good Support System? Yes   Comments Pt cares for wife who has      Barriers   Psychosocial barriers to participate in program The patient should benefit from training in stress management and relaxation.     Screening Interventions   Interventions Encouraged to exercise      Quality of Life Scores:     Quality of Life - 03/23/16 1013      Quality of Life Scores   GLOBAL Pre --  pt QOL scores somewhat altered by recent cardiac event.  pt denies significant or ongoing concerns.      PHQ-9: Recent Review Flowsheet Data    Depression screen [facility] 2/9 03/21/2016   Decreased Interest 0   Down, Depressed, Hopeless 0   PHQ - 2 Score 0      Psychosocial Evaluation and Intervention:     Psychosocial Evaluation - 03/30/16 0934      Psychosocial Evaluation & Interventions   Interventions Stress management education;Relaxation education;Encouraged to exercise with the program and follow exercise prescription   Comments no psychosocial needs identified, no intervention necessary    Continued Psychosocial Services Needed No      Psychosocial Re-Evaluation:     Psychosocial Re-Evaluation    [place] Name 03/30/16 0934             Psychosocial Re-Evaluation   Interventions Encouraged to attend Cardiac Rehabilitation for the exercise       Comments pt states he is adjusting to exercise  lifestyle.  pt has decreased PO salt intake which has lowered resting BP        Continued Psychosocial Services Needed No          Vocational Rehabilitation: Provide vocational rehab assistance to qualifying candidates.   Vocational Rehab Evaluation & Intervention:     Vocational Rehab - 03/17/16 1518      Initial Vocational Rehab Evaluation & Intervention   Assessment shows need for Vocational Rehabilitation No  Pt reports he may return back to work without any difficulty      Education: Education Goals: Education classes will be provided on a weekly basis, covering required topics. Participant will state understanding/return demonstration of topics presented.  Learning Barriers/Preferences:     Learning Barriers/Preferences - 03/17/16 0913      Learning Barriers/Preferences   Learning Barriers Sight  reading glasses   Learning Preferences Pictoral;Skilled Demonstration      Education Topics: Count Your Pulse:  -Group instruction provided by verbal instruction, demonstration, patient participation and written materials to support subject.  Instructors address importance of being able to find your pulse and how to count your pulse when at home without a heart monitor.  Patients get hands on experience counting their pulse with staff help and individually.   Heart Attack, Angina, and Risk Factor Modification:  -Group instruction provided by verbal instruction, video, and written materials to support subject.  Instructors address signs and symptoms of angina and heart attacks.    Also discuss risk factors for heart disease and how to make changes to improve heart health risk factors.   Functional Fitness:  -Group instruction provided by verbal instruction, demonstration, patient participation, and written materials to support subject.  Instructors address safety measures for doing things around the house.  Discuss how to get up and down off the floor,  how to pick things up  properly, how to safely get out of a chair without assistance, and balance training.   Meditation and Mindfulness:  -Group instruction provided by verbal instruction, patient participation, and written materials to support subject.  Instructor addresses importance of mindfulness and meditation practice to help reduce stress and improve awareness.  Instructor also leads participants through a meditation exercise.    Stretching for Flexibility and Mobility:  -Group instruction provided by verbal instruction, patient participation, and written materials to support subject.  Instructors lead participants through series of stretches that are designed to increase flexibility thus improving mobility.  These stretches are additional exercise for major muscle groups that are typically performed during regular warm up and cool down.   Hands Only CPR Anytime:  -Group instruction provided by verbal instruction, video, patient participation and written materials to support subject.  Instructors co-teach with AHA video for hands only CPR.  Participants get hands on experience with mannequins.   Nutrition I class: Heart Healthy Eating:  -Group instruction provided by PowerPoint slides, verbal discussion, and written materials to support subject matter. The instructor gives an explanation and review of the Therapeutic Lifestyle Changes diet recommendations, which includes a discussion on lipid goals, dietary fat, sodium, fiber, plant stanol/sterol esters, sugar, and the components of a well-balanced, healthy diet.   Nutrition II class: Lifestyle Skills:  -Group instruction provided by PowerPoint slides, verbal discussion, and written materials to support subject matter. The instructor gives an explanation and review of label reading, grocery shopping for heart health, heart healthy recipe modifications, and ways to make healthier choices when eating out. Flowsheet Row CARDIAC REHAB PHASE II EXERCISE from  03/28/2016 in [facility]  Date  03/29/16  Educator  RD  Instruction Review Code  2- meets goals/outcomes      Diabetes Question & Answer:  -Group instruction provided by PowerPoint slides, verbal discussion, and written materials to support subject matter. The instructor gives an explanation and review of diabetes co-morbidities, pre- and post-prandial blood glucose goals, pre-exercise blood glucose goals, signs, symptoms, and treatment of hypoglycemia and hyperglycemia, and foot care basics.   Diabetes Blitz:  -Group instruction provided by PowerPoint slides, verbal discussion, and written materials to support subject matter. The instructor gives an explanation and review of the physiology behind type 1 and type 2 diabetes, diabetes medications and rational behind using different medications, pre- and post-prandial blood glucose recommendations and Hemoglobin A1c goals, diabetes diet, and exercise including blood glucose guidelines for exercising safely.    Portion Distortion:  -Group instruction provided by PowerPoint slides, verbal discussion, written materials, and food models to support subject matter. The instructor gives an explanation of serving size versus portion size, changes in portions sizes over the last 20 years, and what consists of a serving from each food group. Flowsheet Row CARDIAC REHAB PHASE II EXERCISE from 03/28/2016 in [facility]  Date  03/23/16  Educator  RD  Instruction Review Code  2- meets goals/outcomes      Stress Management:  -Group instruction provided by verbal instruction, video, and written materials to support subject matter.  Instructors review role of stress in heart disease and how to cope with stress positively.     Exercising on Your Own:  -Group instruction provided by verbal instruction, power point, and written materials to support subject.  Instructors discuss benefits of exercise,  components of exercise, frequency and intensity of exercise, and end points for exercise.  Also discuss use of nitroglycerin and activating EMS.  Review options of places to exercise outside of rehab.  Review guidelines for sex with heart disease.   Cardiac Drugs I:  -Group instruction provided by verbal instruction and written materials to support subject.  Instructor reviews cardiac drug classes: antiplatelets, anticoagulants, beta blockers, and statins.  Instructor discusses reasons, side effects, and lifestyle considerations for each drug class.   Cardiac Drugs II:  -Group instruction provided by verbal instruction and written materials to support subject.  Instructor reviews cardiac drug classes: angiotensin converting enzyme inhibitors (ACE-I), angiotensin II receptor blockers (ARBs), nitrates, and calcium channel blockers.  Instructor discusses reasons, side effects, and lifestyle considerations for each drug class.   Anatomy and Physiology of the Circulatory System:  -Group instruction provided by verbal instruction, video, and written materials to support subject.  Reviews functional anatomy of heart, how it relates to various diagnoses, and what role the heart plays in the overall system.   Knowledge Questionnaire Score:     Knowledge Questionnaire Score - 03/21/16 1055      Knowledge Questionnaire Score   Pre Score 22/24     DM 14/15      Core Components/Risk Factors/Patient Goals at Admission:     Personal Goals and Risk Factors at Admission - 03/17/16 0916      Core Components/Risk Factors/Patient Goals on Admission    Weight Management Yes;Obesity;Weight Loss   Intervention Weight Management: Develop a combined nutrition and exercise program designed to reach desired caloric intake, while maintaining appropriate intake of nutrient and fiber, sodium and fats, and appropriate energy expenditure required for the weight goal.;Weight Management: Provide education and  appropriate resources to help participant work on and attain dietary goals.;Weight Management/Obesity: Establish reasonable short term and long term weight goals.;Obesity: Provide education and appropriate resources to help participant work on and attain dietary goals.   Expected Outcomes Short Term: Continue to assess and modify interventions until short term weight is achieved;Long Term: Adherence to nutrition and physical activity/exercise program aimed toward attainment of established weight goal;Weight Maintenance: Understanding of the daily nutrition guidelines, which includes 25-35% calories from fat, 7% or less cal from saturated fats, less than 2102m cholesterol, less than 1.5gm of sodium, & 5 or more servings of fruits and vegetables daily;Weight Loss: Understanding of general recommendations for a balanced deficit meal plan, which promotes 1-2 lb weight loss per week and includes a negative energy balance of [phone] kcal/d;Understanding recommendations for meals to include 15-35% energy as protein, 25-35% energy from fat, 35-60% energy from carbohydrates, less than 2056mof dietary cholesterol, 20-35 gm of total fiber daily;Understanding of distribution of calorie intake throughout the day with the consumption of 4-5 meals/snacks   Increase Strength and Stamina Yes   Intervention Provide advice, education, support and counseling about physical activity/exercise needs.;Develop an individualized exercise prescription for aerobic and resistive training based on initial evaluation findings, risk stratification, comorbidities and participant's personal goals.   Expected Outcomes Achievement of increased cardiorespiratory fitness and enhanced flexibility, muscular endurance and strength shown through measurements of functional capacity and personal statement of participant.   Improve shortness of breath with ADL's Yes   Intervention Provide education, individualized exercise plan and daily activity  instruction to help decrease symptoms of SOB with activities of daily living.   Expected Outcomes Short Term: Achieves a reduction of symptoms when performing activities of daily living.   Diabetes Yes   Intervention Provide education about signs/symptoms and action to take for hypo/hyperglycemia.;Provide education  about proper nutrition, including hydration, and aerobic/resistive exercise prescription along with prescribed medications to achieve blood glucose in normal ranges: Fasting glucose 65-99 mg/dL   Expected Outcomes Short Term: Participant verbalizes understanding of the signs/symptoms and immediate care of hyper/hypoglycemia, proper foot care and importance of medication, aerobic/resistive exercise and nutrition plan for blood glucose control.;Long Term: Attainment of HbA1C < 7%.   Hypertension Yes   Intervention Provide education on lifestyle modifcations including regular physical activity/exercise, weight management, moderate sodium restriction and increased consumption of fresh fruit, vegetables, and low fat dairy, alcohol moderation, and smoking cessation.;Monitor prescription use compliance.   Expected Outcomes Short Term: Continued assessment and intervention until BP is < 140/68m HG in hypertensive participants. < 130/892mHG in hypertensive participants with diabetes, heart failure or chronic kidney disease.;Long Term: Maintenance of blood pressure at goal levels.   Lipids Yes   Intervention Provide education and support for participant on nutrition & aerobic/resistive exercise along with prescribed medications to achieve LDL <7060mHDL >37m52m Expected Outcomes Short Term: Participant states understanding of desired cholesterol values and is compliant with medications prescribed. Participant is following exercise prescription and nutrition guidelines.;Long Term: Cholesterol controlled with medications as prescribed, with individualized exercise RX and with personalized nutrition plan.  Value goals: LDL < 70mg39mL > 40 mg.   Personal Goal Other Yes   Personal Goal Get back to work, improve lung capacity and lose wt ( short: 10lbs in 6 weeks and long: 40lbs in 4 months)   Intervention Provide nutrition and exercise counseling to improve cardiovascular fitness and assist with weightloss   Expected Outcomes Pt will be able to return to work, improve fitness levels and lose weight      Core Components/Risk Factors/Patient Goals Review:    Core Components/Risk Factors/Patient Goals at Discharge (Final Review):    ITP Comments:     ITP Comments    Row Name 03/17/16 0912 03/25/16 1350         ITP Comments Dr. TraciFransico Himical Director Patient attended the "Taking the high out of high blood pressure" video/lecture education class on 03/25/16. Met outcomes/goals.         Comments: Pt is making expected progress toward personal goals after completing 3 sessions. Recommend continued exercise and life style modification education including  stress management and relaxation techniques to decrease cardiac risk profile.

## 2016-04-04 ENCOUNTER — Encounter (HOSPITAL_COMMUNITY)
Admission: RE | Admit: 2016-04-04 | Discharge: 2016-04-04 | Disposition: A | Discharge status: Home / Self Care | Payer: BLUE CROSS/BLUE SHIELD | Source: Ambulatory Visit | Attending: Cardiology | Admitting: Cardiology

## 2016-04-04 DIAGNOSIS — Z951 Presence of aortocoronary bypass graft: Secondary | ICD-10-CM | POA: Diagnosis not present

## 2016-04-04 DIAGNOSIS — Z736 Limitation of activities due to disability: Secondary | ICD-10-CM

## 2016-04-05 ENCOUNTER — Other Ambulatory Visit: Payer: Self-pay | Admitting: Thoracic Surgery (Cardiothoracic Vascular Surgery)

## 2016-04-05 DIAGNOSIS — Z951 Presence of aortocoronary bypass graft: Secondary | ICD-10-CM

## 2016-04-06 ENCOUNTER — Encounter (HOSPITAL_COMMUNITY)
Admission: RE | Admit: 2016-04-06 | Discharge: 2016-04-06 | Disposition: A | Discharge status: Home / Self Care | Payer: BLUE CROSS/BLUE SHIELD | Source: Ambulatory Visit | Attending: Cardiology | Admitting: Cardiology

## 2016-04-06 ENCOUNTER — Ambulatory Visit (INDEPENDENT_AMBULATORY_CARE_PROVIDER_SITE_OTHER): Payer: Self-pay | Admitting: Thoracic Surgery (Cardiothoracic Vascular Surgery)

## 2016-04-06 ENCOUNTER — Ambulatory Visit
Admission: RE | Admit: 2016-04-06 | Discharge: 2016-04-06 | Disposition: A | Discharge status: Home / Self Care | Payer: BLUE CROSS/BLUE SHIELD | Source: Ambulatory Visit | Attending: Thoracic Surgery (Cardiothoracic Vascular Surgery) | Admitting: Thoracic Surgery (Cardiothoracic Vascular Surgery)

## 2016-04-06 ENCOUNTER — Encounter: Payer: Self-pay | Admitting: Thoracic Surgery (Cardiothoracic Vascular Surgery)

## 2016-04-06 VITALS — BP 150/82 | HR 84 | Resp 16 | Ht 72.0 in | Wt 262.1 lb

## 2016-04-06 DIAGNOSIS — Z951 Presence of aortocoronary bypass graft: Secondary | ICD-10-CM

## 2016-04-06 NOTE — Progress Notes (Signed)
[address]       [place] [id number]             [phone]       HPI: [name] returns for a postoperative follow-up visit.  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 He did well postoperatively with no major complications. He was seen in the office on 02/15/2016 by Gershon CraneWayne Gold. He still had some incisional pain, but was otherwise doing well. He has seen Dr. Jens Somrenshaw since surgery as well.  He currently feels well. He is not having any significant pain but does still feel a little sore. He is not taking any narcotics. He has not had any recurrent angina. He has started cardiac rehabilitation and that has been going well so far.  Past Medical History:  Diagnosis Date  . Chickenpox   . Hyperlipemia   . Hypertension   . Kidney stones 2013      Current Outpatient Prescriptions  Medication Sig Dispense Refill  . atorvastatin (LIPITOR) 80 MG tablet Take 1 tablet (80 mg total) by mouth daily. 30 tablet 10  . clopidogrel (PLAVIX) 75 MG tablet Take 1 tablet (75 mg total) by mouth daily. 90 tablet 3  . metFORMIN (GLUCOPHAGE) 500 MG tablet Take 1 tablet (500 mg total) by mouth 2 (two) times daily with a meal. 60 tablet 2  . metoprolol (LOPRESSOR) 50 MG tablet Take 1 tablet (50 mg total) by mouth 2 (two) times daily. 60 tablet 10  . traMADol (ULTRAM) 50 MG tablet Take 1 tablet (50 mg total) by mouth every 8 (eight) hours as needed. 30 tablet 0   No current facility-administered medications for this visit.     Physical Exam BP (!) 150/82   Pulse 84   Resp 16   Ht 6' (1.829 m)   Wt 262 lb 2 oz (118.9 kg)   SpO2 98% Comment: ON RA  BMI 35.2855 kg/m  55 year old man in no acute distress Alert and oriented 3 with no focal deficits Sternal incision well-healed, sternum stable Lungs with equal breath sounds bilaterally Cardiac regular rate and rhythm normal S1 and  S2 no rubs or murmurs  Diagnostic Tests: CHEST  2 VIEW  COMPARISON:  01/26/2016  FINDINGS: Upper normal heart size post CABG.  Mediastinal contours and pulmonary vascularity normal.  Lungs clear.  No infiltrate, pleural effusion or pneumothorax.  Scattered endplate spur formation at vertebral endplates thoracic spine.  Bones otherwise unremarkable.  IMPRESSION: Post CABG.  No acute abnormalities.   Electronically Signed   By: Ulyses SouthwardMark  Boles M.D.   On: 04/06/2016 14:31  I personally reviewed the chest x-ray and concur with the findings noted above.  Impression: [name] is a 55 year old gentleman who presented with angina. He had left main and three-vessel coronary disease. He underwent coronary bypass grafting 4 on 01/13/2016. Postoperative course was unremarkable. He has recovered well. He has started cardiac rehabilitation. I emphasized the importance of completing that program and continuing to exercise going forward.  He has concerns about going back to work. He particularly is concerned that he might disrupt his breast bone. At this point the breast bone is healed and he is safe to resume activities. I think caution him to build into her activities gradually though as he may experience significant pain starting out. 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  May return to work on 04/25/2016. No more than 4 hours a day on a forklift.  Plan:  He will continue to be followed by Dr. Olga Millers. I will be happy to see him back any time the future if I can be of any further assistance with his care.  Loreli Slot, MD [facility] [phone]

## 2016-04-08 ENCOUNTER — Encounter (HOSPITAL_COMMUNITY)
Admission: RE | Admit: 2016-04-08 | Discharge: 2016-04-08 | Disposition: A | Discharge status: Home / Self Care | Payer: BLUE CROSS/BLUE SHIELD | Source: Ambulatory Visit | Attending: Cardiology | Admitting: Cardiology

## 2016-04-08 DIAGNOSIS — Z951 Presence of aortocoronary bypass graft: Secondary | ICD-10-CM

## 2016-04-11 ENCOUNTER — Encounter (HOSPITAL_COMMUNITY)
Admission: RE | Admit: 2016-04-11 | Discharge: 2016-04-11 | Disposition: A | Discharge status: Home / Self Care | Payer: BLUE CROSS/BLUE SHIELD | Source: Ambulatory Visit | Attending: Cardiology | Admitting: Cardiology

## 2016-04-11 DIAGNOSIS — Z951 Presence of aortocoronary bypass graft: Secondary | ICD-10-CM

## 2016-04-13 ENCOUNTER — Encounter (HOSPITAL_COMMUNITY)
Admission: RE | Admit: 2016-04-13 | Discharge: 2016-04-13 | Disposition: A | Discharge status: Home / Self Care | Payer: BLUE CROSS/BLUE SHIELD | Source: Ambulatory Visit | Attending: Cardiology | Admitting: Cardiology

## 2016-04-13 DIAGNOSIS — Z951 Presence of aortocoronary bypass graft: Secondary | ICD-10-CM

## 2016-04-13 NOTE — Progress Notes (Signed)
Dennis Hopkins 55 y.o. male Nutrition Note Spoke with pt. Nutrition Plan and Nutrition Survey goals reviewed with pt. Pt is not following the Therapeutic Lifestyle Changes diet. Areas to focus on making dietary changes discussed. Barriers to dietary changes include pt in the pre-contemplative state of change re: diet and pt's wife is "a Programmer, multimediacountry cook, who believes in bacon grease and large portions." Pt wants to lose wt. Pt has not been actively trying to lose wt. Pt plans on prioritizing wt loss once he returns to work. Pt is diabetic. Last A1c indicates blood glucose well-controlled. This Clinical research associatewriter went over Diabetes Education test results. Pt checks fasting CBG's daily. Per discussion, pt does uses convenience foods (e.g. Macaroni and cheese). Pt expressed understanding of the information reviewed. Pt aware of nutrition education classes offered and plans on attending nutrition classes.  Lab Results  Component Value Date   HGBA1C 6.8 (H) 01/16/2016   Wt Readings from Last 3 Encounters:  04/06/16 262 lb 2 oz (118.9 kg)  03/17/16 263 lb 0.1 oz (119.3 kg)  02/25/16 257 lb (116.6 kg)   Nutrition Diagnosis ? Food-and nutrition-related knowledge deficit related to lack of exposure to information as related to diagnosis of: ? CVD ? DM ? Obesity related to excessive energy intake as evidenced by a BMI of 37.8  Nutrition Intervention ? Pt's individual nutrition plan reviewed with pt. ? Benefits of adopting Therapeutic Lifestyle Changes discussed when Medficts reviewed. ? Pt to attend the Portion Distortion class ? Pt to attend the Diabetes Q & A class ? Pt to attend the   ? Nutrition I class                     ? Nutrition II class     ? Diabetes Blitz class ? Continue client-centered nutrition education by RD, as part of interdisciplinary care. Goal(s) ? Pt to identify and limit food sources of saturated fat, trans fat, and sodium ? Pt to identify food quantities necessary to achieve weight loss  of 6-24 lb (2.7-10.9 kg) at graduation from cardiac rehab.  ? CBG concentrations in the normal range or as close to normal as is safely possible. Monitor and Evaluate progress toward nutrition goal with team. Mickle PlumbEdna Missie Gehrig, M.Ed, RD, LDN, CDE 04/13/2016 9:08 AM

## 2016-04-15 ENCOUNTER — Encounter (HOSPITAL_COMMUNITY)
Admission: RE | Admit: 2016-04-15 | Discharge: 2016-04-15 | Disposition: A | Discharge status: Home / Self Care | Payer: BLUE CROSS/BLUE SHIELD | Source: Ambulatory Visit | Attending: Cardiology | Admitting: Cardiology

## 2016-04-15 DIAGNOSIS — Z951 Presence of aortocoronary bypass graft: Secondary | ICD-10-CM | POA: Diagnosis not present

## 2016-04-18 ENCOUNTER — Encounter (HOSPITAL_COMMUNITY)
Admission: RE | Admit: 2016-04-18 | Discharge: 2016-04-18 | Disposition: A | Discharge status: Home / Self Care | Payer: BLUE CROSS/BLUE SHIELD | Source: Ambulatory Visit | Attending: Cardiology | Admitting: Cardiology

## 2016-04-18 DIAGNOSIS — Z951 Presence of aortocoronary bypass graft: Secondary | ICD-10-CM | POA: Diagnosis not present

## 2016-04-20 ENCOUNTER — Encounter (HOSPITAL_COMMUNITY)
Admission: RE | Admit: 2016-04-20 | Discharge: 2016-04-20 | Disposition: A | Discharge status: Home / Self Care | Payer: BLUE CROSS/BLUE SHIELD | Source: Ambulatory Visit | Attending: Cardiology | Admitting: Cardiology

## 2016-04-20 DIAGNOSIS — Z951 Presence of aortocoronary bypass graft: Secondary | ICD-10-CM | POA: Diagnosis present

## 2016-04-22 ENCOUNTER — Encounter (HOSPITAL_COMMUNITY)
Admission: RE | Admit: 2016-04-22 | Discharge: 2016-04-22 | Disposition: A | Discharge status: Home / Self Care | Payer: BLUE CROSS/BLUE SHIELD | Source: Ambulatory Visit | Attending: Cardiology | Admitting: Cardiology

## 2016-04-22 DIAGNOSIS — Z951 Presence of aortocoronary bypass graft: Secondary | ICD-10-CM | POA: Diagnosis not present

## 2016-04-25 ENCOUNTER — Encounter (HOSPITAL_COMMUNITY)
Admission: RE | Admit: 2016-04-25 | Discharge: 2016-04-25 | Disposition: A | Discharge status: Home / Self Care | Payer: BLUE CROSS/BLUE SHIELD | Source: Ambulatory Visit | Attending: Cardiology | Admitting: Cardiology

## 2016-04-25 ENCOUNTER — Encounter (HOSPITAL_COMMUNITY): Payer: BLUE CROSS/BLUE SHIELD

## 2016-04-25 DIAGNOSIS — Z951 Presence of aortocoronary bypass graft: Secondary | ICD-10-CM

## 2016-04-27 ENCOUNTER — Encounter (HOSPITAL_COMMUNITY): Payer: BLUE CROSS/BLUE SHIELD

## 2016-04-27 ENCOUNTER — Encounter (HOSPITAL_COMMUNITY)
Admission: RE | Admit: 2016-04-27 | Discharge: 2016-04-27 | Disposition: A | Discharge status: Home / Self Care | Payer: BLUE CROSS/BLUE SHIELD | Source: Ambulatory Visit | Attending: Cardiology | Admitting: Cardiology

## 2016-04-27 DIAGNOSIS — Z951 Presence of aortocoronary bypass graft: Secondary | ICD-10-CM

## 2016-04-27 NOTE — Progress Notes (Signed)
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  atorvastatin (LIPITOR) 80 MG tablet, Take 1 tablet (80 mg total) by mouth daily., Disp: 30 tablet, Rfl: 10 .  clopidogrel (PLAVIX) 75 MG tablet, Take 1 tablet (75 mg total) by mouth daily., Disp: 90 tablet, Rfl: 3 .  metFORMIN (GLUCOPHAGE) 500 MG tablet, Take 1 tablet (500 mg total) by mouth 2 (two) times daily with a meal., Disp: 60 tablet, Rfl: 2 .  metoprolol (LOPRESSOR) 50 MG tablet, Take 1 tablet (50 mg total) by mouth 2 (two) times daily., Disp: 60 tablet, Rfl: 10 .  traMADol (ULTRAM) 50 MG tablet, Take 1 tablet (50 mg total) by mouth every 8 (eight) hours as needed., Disp: 30 tablet, Rfl: 0  Past Medical History: Past Medical History:  Diagnosis Date  . Chickenpox   . Hyperlipemia   . Hypertension   . Kidney stones 2013    Tobacco Use: History  Smoking Status  . Former Smoker  . Packs/day: 2.00  . Years: 35.00  . Types: Cigarettes  . Quit date: 12/21/2013  Smokeless Tobacco  . Never Used    Labs: Recent Review Flowsheet Data    Labs for ITP Cardiac and Pulmonary Rehab Latest Ref Rng & Units 01/13/2016 01/13/2016 01/13/2016 01/14/2016 01/16/2016   Cholestrol 0 - 200 mg/dL - - - - -   LDLCALC 0 - 99 mg/dL - - - - -   LDLDIRECT mg/dL - - - - -   HDL >40 mg/dL - - - - -   Trlycerides <150 mg/dL - - - - -   Hemoglobin A1c 4.8 - 5.6 % - - - - 6.8(H)   PHART 7.350 -  7.450 7.334(L) 7.324(L) - - -   PCO2ART 35.0 - 45.0 mmHg 43.0 45.0 - - -   HCO3 20.0 - 24.0 mEq/L 22.9 23.3 - - -   TCO2 0 - 100 mmol/L _0 -   ACIDBASEDEF 0.0 - 2.0 mmol/L 3.0(H) 3.0(H) - - -   O2SAT % 99.0 99.0 - - -      Capillary Blood Glucose: Lab Results  Component Value Date   GLUCAP 144 (H) 04/01/2016   GLUCAP 126 (H) 03/28/2016   GLUCAP 117 (H) 03/25/2016   GLUCAP 153 (H) 03/23/2016   GLUCAP 135 (H) 03/23/2016     Exercise Target Goals:    Exercise Program Goal: Individual exercise prescription set with THRR, safety & activity barriers. Participant demonstrates ability to understand and report RPE using BORG scale, to self-measure pulse accurately, and to acknowledge the importance of the exercise prescription.  Exercise Prescription Goal: Starting with aerobic activity 30 plus minutes a day, 3 days per week for initial exercise prescription. Provide home exercise prescription and guidelines that participant acknowledges understanding prior to discharge.  Activity Barriers & Risk Stratification:     Activity Barriers & Cardiac Risk Stratification - 03/17/16 0913      Activity  Barriers & Cardiac Risk Stratification   Activity Barriers None   Cardiac Risk Stratification High      6 Minute Walk:     6 Minute Walk    Row Name 03/17/16 1433         6 Minute Walk   Phase Initial     Distance 1837 feet     Walk Time 6 minutes     # of Rest Breaks 0     MPH 3.47     METS 4.2     RPE 11     VO2 Peak 14.76     Symptoms No     Resting HR 70 bpm     Resting BP 138/80     Max Ex. HR 109 bpm     Max Ex. BP 140/78     2 Minute Post BP 122/78        Initial Exercise Prescription:     Initial Exercise Prescription - 03/17/16 1400      Date of Initial Exercise RX and Referring Provider   Date 03/17/16   Referring Provider Kirk Ruths, MD     Bike   Level 1   Minutes 10   METs 2.57     NuStep   Level 3   Minutes 10   METs 2      Track   Laps 11   Minutes 10   METs 2.92     Prescription Details   Frequency (times per week) 3   Duration Progress to 30 minutes of continuous aerobic without signs/symptoms of physical distress     Intensity   THRR 40-80% of Max Heartrate 66-132   Ratings of Perceived Exertion 11-13   Perceived Dyspnea 0-4     Progression   Progression Continue to progress workloads to maintain intensity without signs/symptoms of physical distress.     Resistance Training   Training Prescription Yes   Weight 2lbs   Reps 10-12      Perform Capillary Blood Glucose checks as needed.  Exercise Prescription Changes:     Exercise Prescription Changes    Row Name 03/30/16 1100 04/26/16 0900           Exercise Review   Progression Yes Yes        Response to Exercise   Blood Pressure (Admit) 148/82 132/84      Blood Pressure (Exercise) 148/88 162/80      Blood Pressure (Exit) 106/74 108/70      Heart Rate (Admit) 722 bpm 78 bpm      Heart Rate (Exercise) 104 bpm 136 bpm      Heart Rate (Exit) 72 bpm 87 bpm      Rating of Perceived Exertion (Exercise) 12 12      Comments reviewed HEP on 03/28/16 reviewed HEP on 03/28/16        Progression   Average METs 2.9 4        Resistance Training   Training Prescription Yes Yes      Weight 3lbs 5lbs      Reps 10-12 10-12        Bike   Level 1 1.8      Minutes 10 10      METs 2.57 3.83        NuStep   Level 3 5      Minutes 10 10      METs 3.2 4.3        Track   Laps 18  18      Minutes 10 10      METs 4.14 4.14        Home Exercise Plan   Plans to continue exercise at Home  reviewed HEP on 03/28/16 Home  reviewed HEP on 03/28/16      Frequency Add 2 additional days to program exercise sessions. Add 2 additional days to program exercise sessions.         Exercise Comments:     Exercise Comments    Row Name 04/26/16 0913           Exercise Comments Reviewed METs and goals. Pt is tolerating exercise well; will continue to  monitor exercise progression          Discharge Exercise Prescription (Final Exercise Prescription Changes):     Exercise Prescription Changes - 04/26/16 0900      Exercise Review   Progression Yes     Response to Exercise   Blood Pressure (Admit) 132/84   Blood Pressure (Exercise) 162/80   Blood Pressure (Exit) 108/70   Heart Rate (Admit) 78 bpm   Heart Rate (Exercise) 136 bpm   Heart Rate (Exit) 87 bpm   Rating of Perceived Exertion (Exercise) 12   Comments reviewed HEP on 03/28/16     Progression   Average METs 4     Resistance Training   Training Prescription Yes   Weight 5lbs   Reps 10-12     Bike   Level 1.8   Minutes 10   METs 3.83     NuStep   Level 5   Minutes 10   METs 4.3     Track   Laps 18   Minutes 10   METs 4.14     Home Exercise Plan   Plans to continue exercise at Home  reviewed HEP on 03/28/16   Frequency Add 2 additional days to program exercise sessions.      Nutrition:  Target Goals: Understanding of nutrition guidelines, daily intake of sodium <1535m, cholesterol <2048m calories 30% from fat and 7% or less from saturated fats, daily to have 5 or more servings of fruits and vegetables.  Biometrics:     Pre Biometrics - 03/17/16 1434      Pre Biometrics   Waist Circumference 49 inches   Hip Circumference 42.25 inches   Waist to Hip Ratio 1.16 %   Triceps Skinfold 28 mm   % Body Fat 37.4 %   Grip Strength 49.5 kg   Flexibility 8.75 in   Single Leg Stand 30 seconds       Nutrition Therapy Plan and Nutrition Goals:     Nutrition Therapy & Goals - 03/21/16 1052      Nutrition Therapy   Diet Carb Modified, Therapeutic Lifestyle Change     Personal Nutrition Goals   Personal Goal #1 1-2 lb wt loss/week to a wt loss goal of 6-24 lb     Intervention Plan   Intervention Prescribe, educate and counsel regarding individualized specific dietary modifications aiming towards targeted core components such as weight,  hypertension, lipid management, diabetes, heart failure and other comorbidities.   Expected Outcomes Short Term Goal: Understand basic principles of dietary content, such as calories, fat, sodium, cholesterol and nutrients.;Long Term Goal: Adherence to prescribed nutrition plan.      Nutrition Discharge: Nutrition Scores:     Nutrition Assessments - 04/13/16 0908      MEDFICTS Scores   Pre Score 71  Nutrition Goals Re-Evaluation:   Psychosocial: Target Goals: Acknowledge presence or absence of depression, maximize coping skills, provide positive support system. Participant is able to verbalize types and ability to use techniques and skills needed for reducing stress and depression.  Initial Review & Psychosocial Screening:     Initial Psych Review & Screening - 03/21/16 1050      Initial Review   Current issues with Current Stress Concerns   Source of Stress Concerns Occupation   Comments pt reports having work related stress.     Family Dynamics   Good Support System? Yes   Comments Pt cares for wife who has      Barriers   Psychosocial barriers to participate in program The patient should benefit from training in stress management and relaxation.     Screening Interventions   Interventions Encouraged to exercise      Quality of Life Scores:     Quality of Life - 03/23/16 1013      Quality of Life Scores   GLOBAL Pre --  pt QOL scores somewhat altered by recent cardiac event.  pt denies significant or ongoing concerns.      PHQ-9: Recent Review Flowsheet Data    Depression screen [facility] 2/9 03/21/2016   Decreased Interest 0   Down, Depressed, Hopeless 0   PHQ - 2 Score 0      Psychosocial Evaluation and Intervention:     Psychosocial Evaluation - 03/30/16 0934      Psychosocial Evaluation & Interventions   Interventions Stress management education;Relaxation education;Encouraged to exercise with the program and follow exercise prescription    Comments no psychosocial needs identified, no intervention necessary    Continued Psychosocial Services Needed No      Psychosocial Re-Evaluation:     Psychosocial Re-Evaluation    [place] Name 03/30/16 0934 04/26/16 0943           Psychosocial Re-Evaluation   Interventions Encouraged to attend Cardiac Rehabilitation for the exercise Encouraged to attend Cardiac Rehabilitation for the exercise      Comments pt states he is adjusting to exercise lifestyle.  pt has decreased PO salt intake which has lowered resting BP  no psychosocial needs identified, no intervention necessary. pt is returning to work and has changed exercise class time to 6:46am. pt feels he has increased strength and stamina.  pt admits he often skips home exercise and has noticed weight gain.  pt encouraged to incorporate home exercise into his routine to decrease CAD risk factors and assist with weight loss efforts.      Continued Psychosocial Services Needed No No         Vocational Rehabilitation: Provide vocational rehab assistance to qualifying candidates.   Vocational Rehab Evaluation & Intervention:     Vocational Rehab - 03/17/16 1518      Initial Vocational Rehab Evaluation & Intervention   Assessment shows need for Vocational Rehabilitation No  Pt reports he may return back to work without any difficulty      Education: Education Goals: Education classes will be provided on a weekly basis, covering required topics. Participant will state understanding/return demonstration of topics presented.  Learning Barriers/Preferences:     Learning Barriers/Preferences - 03/17/16 0913      Learning Barriers/Preferences   Learning Barriers Sight  reading glasses   Learning Preferences Pictoral;Skilled Demonstration      Education Topics: Count Your Pulse:  -Group instruction provided by verbal instruction, demonstration, patient participation and written materials to support subject.  Instructors  address importance of being able to find your pulse and how to count your pulse when at home without a heart monitor.  Patients get hands on experience counting their pulse with staff help and individually. Flowsheet Row CARDIAC REHAB PHASE II EXERCISE from 04/27/2016 in [facility]  Date  04/22/16  Educator  Andi Hence, RN  Instruction Review Code  2- meets goals/outcomes      Heart Attack, Angina, and Risk Factor Modification:  -Group instruction provided by verbal instruction, video, and written materials to support subject.  Instructors address signs and symptoms of angina and heart attacks.    Also discuss risk factors for heart disease and how to make changes to improve heart health risk factors. Flowsheet Row CARDIAC REHAB PHASE II EXERCISE from 04/27/2016 in [facility]  Date  04/06/16  Instruction Review Code  2- meets goals/outcomes      Functional Fitness:  -Group instruction provided by verbal instruction, demonstration, patient participation, and written materials to support subject.  Instructors address safety measures for doing things around the house.  Discuss how to get up and down off the floor, how to pick things up properly, how to safely get out of a chair without assistance, and balance training. Flowsheet Row CARDIAC REHAB PHASE II EXERCISE from 04/27/2016 in [place]  Date  04/08/16  Instruction Review Code  2- meets goals/outcomes      Meditation and Mindfulness:  -Group instruction provided by verbal instruction, patient participation, and written materials to support subject.  Instructor addresses importance of mindfulness and meditation practice to help reduce stress and improve awareness.  Instructor also leads participants through a meditation exercise.  Flowsheet Row CARDIAC REHAB PHASE II EXERCISE from 04/27/2016 in [place]  Date   04/13/16  Instruction Review Code  2- meets goals/outcomes      Stretching for Flexibility and Mobility:  -Group instruction provided by verbal instruction, patient participation, and written materials to support subject.  Instructors lead participants through series of stretches that are designed to increase flexibility thus improving mobility.  These stretches are additional exercise for major muscle groups that are typically performed during regular warm up and cool down. Flowsheet Row CARDIAC REHAB PHASE II EXERCISE from 04/27/2016 in [facility]  Date  04/15/16  Instruction Review Code  2- meets goals/outcomes      Hands Only CPR Anytime:  -Group instruction provided by verbal instruction, video, patient participation and written materials to support subject.  Instructors co-teach with AHA video for hands only CPR.  Participants get hands on experience with mannequins.   Nutrition I class: Heart Healthy Eating:  -Group instruction provided by PowerPoint slides, verbal discussion, and written materials to support subject matter. The instructor gives an explanation and review of the Therapeutic Lifestyle Changes diet recommendations, which includes a discussion on lipid goals, dietary fat, sodium, fiber, plant stanol/sterol esters, sugar, and the components of a well-balanced, healthy diet.   Nutrition II class: Lifestyle Skills:  -Group instruction provided by PowerPoint slides, verbal discussion, and written materials to support subject matter. The instructor gives an explanation and review of label reading, grocery shopping for heart health, heart healthy recipe modifications, and ways to make healthier choices when eating out. Flowsheet Row CARDIAC REHAB PHASE II EXERCISE from 04/27/2016 in [place]  Date  03/29/16  Educator  RD  Instruction  Review Code  2- meets goals/outcomes      Diabetes Question & Answer:   -Group instruction provided by PowerPoint slides, verbal discussion, and written materials to support subject matter. The instructor gives an explanation and review of diabetes co-morbidities, pre- and post-prandial blood glucose goals, pre-exercise blood glucose goals, signs, symptoms, and treatment of hypoglycemia and hyperglycemia, and foot care basics. Flowsheet Row CARDIAC REHAB PHASE II EXERCISE from 04/27/2016 in [place]  Date  04/01/16  Educator  Derek M[place]  Instruction Review Code  2- meets goals/outcomes      Diabetes Blitz:  -Group instruction provided by PowerPoint slides, verbal discussion, and written materials to support subject matter. The instructor gives an explanation and review of the physiology behind type 1 and type 2 diabetes, diabetes medications and rational behind using different medications, pre- and post-prandial blood glucose recommendations and Hemoglobin A1c goals, diabetes diet, and exercise including blood glucose guidelines for exercising safely.    Portion Distortion:  -Group instruction provided by PowerPoint slides, verbal discussion, written materials, and food models to support subject matter. The instructor gives an explanation of serving size versus portion size, changes in portions sizes over the last 20 years, and what consists of a serving from each food group. Flowsheet Row CARDIAC REHAB PHASE II EXERCISE from 04/27/2016 in [place]  Date  03/23/16  Educator  RD  Instruction Review Code  2- meets goals/outcomes      Stress Management:  -Group instruction provided by verbal instruction, video, and written materials to support subject matter.  Instructors review role of stress in heart disease and how to cope with stress positively.     Exercising on Your Own:  -Group instruction provided by verbal instruction, power point, and written materials to support subject.  Instructors  discuss benefits of exercise, components of exercise, frequency and intensity of exercise, and end points for exercise.  Also discuss use of nitroglycerin and activating EMS.  Review options of places to exercise outside of rehab.  Review guidelines for sex with heart disease.   Cardiac Drugs I:  -Group instruction provided by verbal instruction and written materials to support subject.  Instructor reviews cardiac drug classes: antiplatelets, anticoagulants, beta blockers, and statins.  Instructor discusses reasons, side effects, and lifestyle considerations for each drug class. Flowsheet Row CARDIAC REHAB PHASE II EXERCISE from 04/27/2016 in [place]  Date  03/30/16  Educator  Clydia [facility]ano [facility]  Instruction Review Code  2- meets goals/outcomes      Cardiac Drugs II:  -Group instruction provided by verbal instruction and written materials to support subject.  Instructor reviews cardiac drug classes: angiotensin converting enzyme inhibitors (ACE-I), angiotensin II receptor blockers (ARBs), nitrates, and calcium channel blockers.  Instructor discusses reasons, side effects, and lifestyle considerations for each drug class. Flowsheet Row CARDIAC REHAB PHASE II EXERCISE from 04/27/2016 in [place]  Date  04/27/16  Instruction Review Code  2- meets goals/outcomes      Anatomy and Physiology of the Circulatory System:  -Group instruction provided by verbal instruction, video, and written materials to support subject.  Reviews functional anatomy of heart, how it relates to various diagnoses, and what role the heart plays in the overall system. Flowsheet Row CARDIAC REHAB PHASE II EXERCISE from 04/27/2016 in [place]  Date  04/20/16  Instruction Review Code  2- meets goals/outcomes  Knowledge Questionnaire Score:     Knowledge Questionnaire Score - 03/21/16 1055      Knowledge Questionnaire  Score   Pre Score 22/24     DM 14/15      Core Components/Risk Factors/Patient Goals at Admission:     Personal Goals and Risk Factors at Admission - 03/17/16 0916      Core Components/Risk Factors/Patient Goals on Admission    Weight Management Yes;Obesity;Weight Loss   Intervention Weight Management: Develop a combined nutrition and exercise program designed to reach desired caloric intake, while maintaining appropriate intake of nutrient and fiber, sodium and fats, and appropriate energy expenditure required for the weight goal.;Weight Management: Provide education and appropriate resources to help participant work on and attain dietary goals.;Weight Management/Obesity: Establish reasonable short term and long term weight goals.;Obesity: Provide education and appropriate resources to help participant work on and attain dietary goals.   Expected Outcomes Short Term: Continue to assess and modify interventions until short term weight is achieved;Long Term: Adherence to nutrition and physical activity/exercise program aimed toward attainment of established weight goal;Weight Maintenance: Understanding of the daily nutrition guidelines, which includes 25-35% calories from fat, 7% or less cal from saturated fats, less than 258m cholesterol, less than 1.5gm of sodium, & 5 or more servings of fruits and vegetables daily;Weight Loss: Understanding of general recommendations for a balanced deficit meal plan, which promotes 1-2 lb weight loss per week and includes a negative energy balance of [id number] kcal/d;Understanding recommendations for meals to include 15-35% energy as protein, 25-35% energy from fat, 35-60% energy from carbohydrates, less than 2029mof dietary cholesterol, 20-35 gm of total fiber daily;Understanding of distribution of calorie intake throughout the day with the consumption of 4-5 meals/snacks   Increase Strength and Stamina Yes   Intervention Provide advice, education, support and  counseling about physical activity/exercise needs.;Develop an individualized exercise prescription for aerobic and resistive training based on initial evaluation findings, risk stratification, comorbidities and participant's personal goals.   Expected Outcomes Achievement of increased cardiorespiratory fitness and enhanced flexibility, muscular endurance and strength shown through measurements of functional capacity and personal statement of participant.   Improve shortness of breath with ADL's Yes   Intervention Provide education, individualized exercise plan and daily activity instruction to help decrease symptoms of SOB with activities of daily living.   Expected Outcomes Short Term: Achieves a reduction of symptoms when performing activities of daily living.   Diabetes Yes   Intervention Provide education about signs/symptoms and action to take for hypo/hyperglycemia.;Provide education about proper nutrition, including hydration, and aerobic/resistive exercise prescription along with prescribed medications to achieve blood glucose in normal ranges: Fasting glucose 65-99 mg/dL   Expected Outcomes Short Term: Participant verbalizes understanding of the signs/symptoms and immediate care of hyper/hypoglycemia, proper foot care and importance of medication, aerobic/resistive exercise and nutrition plan for blood glucose control.;Long Term: Attainment of HbA1C < 7%.   Hypertension Yes   Intervention Provide education on lifestyle modifcations including regular physical activity/exercise, weight management, moderate sodium restriction and increased consumption of fresh fruit, vegetables, and low fat dairy, alcohol moderation, and smoking cessation.;Monitor prescription use compliance.   Expected Outcomes Short Term: Continued assessment and intervention until BP is < 140/9053mG in hypertensive participants. < 130/96m58m in hypertensive participants with diabetes, heart failure or chronic kidney  disease.;Long Term: Maintenance of blood pressure at goal levels.   Lipids Yes   Intervention Provide education and support for participant on nutrition & aerobic/resistive exercise along with prescribed medications to  achieve LDL <78m, HDL >473m   Expected Outcomes Short Term: Participant states understanding of desired cholesterol values and is compliant with medications prescribed. Participant is following exercise prescription and nutrition guidelines.;Long Term: Cholesterol controlled with medications as prescribed, with individualized exercise RX and with personalized nutrition plan. Value goals: LDL < 7028mHDL > 40 mg.   Personal Goal Other Yes   Personal Goal Get back to work, improve lung capacity and lose wt ( short: 10lbs in 6 weeks and long: 40lbs in 4 months)   Intervention Provide nutrition and exercise counseling to improve cardiovascular fitness and assist with weightloss   Expected Outcomes Pt will be able to return to work, improve fitness levels and lose weight      Core Components/Risk Factors/Patient Goals Review:      Goals and Risk Factor Review    Row Name 04/26/16 0914 04/26/16 0919           Core Components/Risk Factors/Patient Goals Review   Personal Goals Review Other;Weight Management/Obesity  -      Review Pt weight is fluctuating. Pt has met with Nutritionist and Diabetes educator regarding weighloss tips and nutrition. Pt weight is fluctuating. Pt has met with Nutritionist and Diabetes educator regarding weighloss tips and nutrition. Pt returned to work on Nov. 6      Expected Outcomes Pt will continue to follow nutrition plan to assist with weighloss goals Pt will continue to follow nutrition plan to assist with weighloss goals. Pt will be able to work without complications/difficulty         Core Components/Risk Factors/Patient Goals at Discharge (Final Review):      Goals and Risk Factor Review - 04/26/16 0919      Core Components/Risk  Factors/Patient Goals Review   Review Pt weight is fluctuating. Pt has met with Nutritionist and Diabetes educator regarding weighloss tips and nutrition. Pt returned to work on Nov. 6   Expected Outcomes Pt will continue to follow nutrition plan to assist with weighloss goals. Pt will be able to work without complications/difficulty      ITP Comments:     ITP Comments    Row Name 03/17/16 0912 03/25/16 1350         ITP Comments Dr. TraFransico Himedical Director Patient attended the "Taking the high out of high blood pressure" video/lecture education class on 03/25/16. Met outcomes/goals.         Comments: Pt is making expected progress toward personal goals after completing 16 sessions. Recommend continued exercise and life style modification education including  stress management and relaxation techniques to decrease cardiac risk profile.

## 2016-04-29 ENCOUNTER — Encounter (HOSPITAL_COMMUNITY): Admission: RE | Admit: 2016-04-29 | Payer: BLUE CROSS/BLUE SHIELD | Source: Ambulatory Visit

## 2016-04-29 ENCOUNTER — Encounter (HOSPITAL_COMMUNITY): Payer: BLUE CROSS/BLUE SHIELD

## 2016-05-02 ENCOUNTER — Encounter (HOSPITAL_COMMUNITY): Payer: BLUE CROSS/BLUE SHIELD

## 2016-05-02 ENCOUNTER — Encounter (HOSPITAL_COMMUNITY)
Admission: RE | Admit: 2016-05-02 | Discharge: 2016-05-02 | Disposition: A | Discharge status: Home / Self Care | Payer: BLUE CROSS/BLUE SHIELD | Source: Ambulatory Visit | Attending: Cardiology | Admitting: Cardiology

## 2016-05-02 DIAGNOSIS — Z951 Presence of aortocoronary bypass graft: Secondary | ICD-10-CM

## 2016-05-04 ENCOUNTER — Encounter (HOSPITAL_COMMUNITY)
Admission: RE | Admit: 2016-05-04 | Discharge: 2016-05-04 | Disposition: A | Discharge status: Home / Self Care | Payer: BLUE CROSS/BLUE SHIELD | Source: Ambulatory Visit | Attending: Cardiology | Admitting: Cardiology

## 2016-05-04 ENCOUNTER — Encounter (HOSPITAL_COMMUNITY): Payer: BLUE CROSS/BLUE SHIELD

## 2016-05-04 DIAGNOSIS — Z951 Presence of aortocoronary bypass graft: Secondary | ICD-10-CM

## 2016-05-06 ENCOUNTER — Encounter (HOSPITAL_COMMUNITY)
Admission: RE | Admit: 2016-05-06 | Discharge: 2016-05-06 | Disposition: A | Discharge status: Home / Self Care | Payer: BLUE CROSS/BLUE SHIELD | Source: Ambulatory Visit | Attending: Cardiology | Admitting: Cardiology

## 2016-05-06 ENCOUNTER — Encounter (HOSPITAL_COMMUNITY): Payer: BLUE CROSS/BLUE SHIELD

## 2016-05-06 DIAGNOSIS — Z951 Presence of aortocoronary bypass graft: Secondary | ICD-10-CM | POA: Diagnosis not present

## 2016-05-09 ENCOUNTER — Encounter (HOSPITAL_COMMUNITY): Payer: BLUE CROSS/BLUE SHIELD

## 2016-05-11 ENCOUNTER — Encounter (HOSPITAL_COMMUNITY): Admission: RE | Admit: 2016-05-11 | Payer: BLUE CROSS/BLUE SHIELD | Source: Ambulatory Visit

## 2016-05-11 ENCOUNTER — Encounter (HOSPITAL_COMMUNITY): Payer: BLUE CROSS/BLUE SHIELD

## 2016-05-13 ENCOUNTER — Encounter (HOSPITAL_COMMUNITY): Payer: BLUE CROSS/BLUE SHIELD

## 2016-05-16 ENCOUNTER — Encounter (HOSPITAL_COMMUNITY): Payer: BLUE CROSS/BLUE SHIELD

## 2016-05-18 ENCOUNTER — Encounter (HOSPITAL_COMMUNITY): Payer: BLUE CROSS/BLUE SHIELD

## 2016-05-18 ENCOUNTER — Encounter (HOSPITAL_COMMUNITY)
Admission: RE | Admit: 2016-05-18 | Discharge: 2016-05-18 | Disposition: A | Discharge status: Home / Self Care | Payer: BLUE CROSS/BLUE SHIELD | Source: Ambulatory Visit | Attending: Cardiology | Admitting: Cardiology

## 2016-05-18 DIAGNOSIS — Z951 Presence of aortocoronary bypass graft: Secondary | ICD-10-CM

## 2016-05-20 ENCOUNTER — Encounter (HOSPITAL_COMMUNITY): Payer: BLUE CROSS/BLUE SHIELD

## 2016-05-20 ENCOUNTER — Encounter (HOSPITAL_COMMUNITY): Admission: RE | Admit: 2016-05-20 | Payer: BLUE CROSS/BLUE SHIELD | Source: Ambulatory Visit

## 2016-05-23 ENCOUNTER — Encounter (HOSPITAL_COMMUNITY)
Admission: RE | Admit: 2016-05-23 | Discharge: 2016-05-23 | Disposition: A | Discharge status: Home / Self Care | Payer: BLUE CROSS/BLUE SHIELD | Source: Ambulatory Visit | Attending: Cardiology | Admitting: Cardiology

## 2016-05-23 ENCOUNTER — Encounter (HOSPITAL_COMMUNITY): Payer: BLUE CROSS/BLUE SHIELD

## 2016-05-23 DIAGNOSIS — Z951 Presence of aortocoronary bypass graft: Secondary | ICD-10-CM | POA: Diagnosis not present

## 2016-05-25 ENCOUNTER — Encounter (HOSPITAL_COMMUNITY)
Admission: RE | Admit: 2016-05-25 | Discharge: 2016-05-25 | Disposition: A | Discharge status: Home / Self Care | Payer: BLUE CROSS/BLUE SHIELD | Source: Ambulatory Visit | Attending: Cardiology | Admitting: Cardiology

## 2016-05-25 ENCOUNTER — Encounter (HOSPITAL_COMMUNITY): Payer: BLUE CROSS/BLUE SHIELD

## 2016-05-25 VITALS — Wt 264.6 lb

## 2016-05-25 DIAGNOSIS — Z951 Presence of aortocoronary bypass graft: Secondary | ICD-10-CM

## 2016-05-26 NOTE — Progress Notes (Addendum)
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  atorvastatin (LIPITOR) 80 MG tablet, Take 1 tablet (80 mg total) by mouth daily., Disp: 30 tablet, Rfl: 10 .  clopidogrel (PLAVIX) 75 MG tablet, Take 1 tablet (75 mg total) by mouth daily., Disp: 90 tablet, Rfl: 3 .  metFORMIN (GLUCOPHAGE) 500 MG tablet, Take 1 tablet (500 mg total) by mouth 2 (two) times daily with a meal., Disp: 60 tablet, Rfl: 2 .  metoprolol (LOPRESSOR) 50 MG tablet, Take 1 tablet (50 mg total) by mouth 2 (two) times daily., Disp: 60 tablet, Rfl: 10 .  traMADol (ULTRAM) 50 MG tablet, Take 1 tablet (50 mg total) by mouth every 8 (eight) hours as needed., Disp: 30 tablet, Rfl: 0  Past Medical History: Past Medical History:  Diagnosis Date  . Chickenpox   . Hyperlipemia   . Hypertension   . Kidney stones 2013    Tobacco Use: History  Smoking Status  . Former Smoker  . Packs/day: 2.00  . Years: 35.00  . Types: Cigarettes  . Quit date: 12/21/2013  Smokeless Tobacco  . Never Used    Labs: Recent Review Flowsheet Data    Labs for ITP Cardiac and Pulmonary Rehab Latest Ref Rng & Units 01/13/2016 01/13/2016 01/13/2016 01/14/2016 01/16/2016   Cholestrol 0 - 200 mg/dL - - - - -   LDLCALC 0 - 99 mg/dL - - - - -   LDLDIRECT mg/dL - - - - -   HDL >40 mg/dL - - - - -   Trlycerides <150 mg/dL - - - - -   Hemoglobin A1c 4.8 - 5.6 % - - - - 6.8(H)   PHART 7.350 -  7.450 7.334(L) 7.324(L) - - -   PCO2ART 35.0 - 45.0 mmHg 43.0 45.0 - - -   HCO3 20.0 - 24.0 mEq/L 22.9 23.3 - - -   TCO2 0 - 100 mmol/L _0 -   ACIDBASEDEF 0.0 - 2.0 mmol/L 3.0(H) 3.0(H) - - -   O2SAT % 99.0 99.0 - - -      Capillary Blood Glucose: Lab Results  Component Value Date   GLUCAP 144 (H) 04/01/2016   GLUCAP 126 (H) 03/28/2016   GLUCAP 117 (H) 03/25/2016   GLUCAP 153 (H) 03/23/2016   GLUCAP 135 (H) 03/23/2016     Exercise Target Goals:    Exercise Program Goal: Individual exercise prescription set with THRR, safety & activity barriers. Participant demonstrates ability to understand and report RPE using BORG scale, to self-measure pulse accurately, and to acknowledge the importance of the exercise prescription.  Exercise Prescription Goal: Starting with aerobic activity 30 plus minutes a day, 3 days per week for initial exercise prescription. Provide home exercise prescription and guidelines that participant acknowledges understanding prior to discharge.  Activity Barriers & Risk Stratification:     Activity Barriers & Cardiac Risk Stratification - 03/17/16 0913      Activity  Barriers & Cardiac Risk Stratification   Activity Barriers None   Cardiac Risk Stratification High      6 Minute Walk:     6 Minute Walk    Row Name 03/17/16 1433         6 Minute Walk   Phase Initial     Distance 1837 feet     Walk Time 6 minutes     # of Rest Breaks 0     MPH 3.47     METS 4.2     RPE 11     VO2 Peak 14.76     Symptoms No     Resting HR 70 bpm     Resting BP 138/80     Max Ex. HR 109 bpm     Max Ex. BP 140/78     2 Minute Post BP 122/78        Initial Exercise Prescription:     Initial Exercise Prescription - 03/17/16 1400      Date of Initial Exercise RX and Referring Provider   Date 03/17/16   Referring Provider Kirk Ruths, MD     Bike   Level 1   Minutes 10   METs 2.57     NuStep   Level 3   Minutes 10   METs 2      Track   Laps 11   Minutes 10   METs 2.92     Prescription Details   Frequency (times per week) 3   Duration Progress to 30 minutes of continuous aerobic without signs/symptoms of physical distress     Intensity   THRR 40-80% of Max Heartrate 66-132   Ratings of Perceived Exertion 11-13   Perceived Dyspnea 0-4     Progression   Progression Continue to progress workloads to maintain intensity without signs/symptoms of physical distress.     Resistance Training   Training Prescription Yes   Weight 2lbs   Reps 10-12      Perform Capillary Blood Glucose checks as needed.  Exercise Prescription Changes:     Exercise Prescription Changes    Row Name 03/30/16 1100 04/26/16 0900 05/16/16 1100         Exercise Review   Progression Yes Yes Yes       Response to Exercise   Blood Pressure (Admit) 148/82 132/84 118/78     Blood Pressure (Exercise) 148/88 162/80 158/82     Blood Pressure (Exit) 106/74 108/70 122/68     Heart Rate (Admit) 722 bpm 78 bpm 79 bpm     Heart Rate (Exercise) 104 bpm 136 bpm 142 bpm     Heart Rate (Exit) 72 bpm 87 bpm 86 bpm     Rating of Perceived Exertion (Exercise) '12 12 12     '$ Comments reviewed HEP on 03/28/16 reviewed HEP on 03/28/16 reviewed HEP on 03/28/16       Progression   Average METs 2.9 4 4.3       Resistance Training   Training Prescription Yes Yes Yes     Weight 3lbs 5lbs 5lbs     Reps 10-12 10-12 10-12       Bike   Level 1 1.8 1.8     Minutes '10 10 10     '$ METs 2.57 3.83 3.83       NuStep   Level '3 5 5     '$ Minutes '10 10 10     '$ METs 3.2 4.3 5  Track   Laps '18 18 17     '$ Minutes '10 10 10     '$ METs 4.14 4.14 3.95       Home Exercise Plan   Plans to continue exercise at Home  reviewed HEP on 03/28/16 Home  reviewed HEP on 03/28/16 Home  reviewed HEP on 03/28/16     Frequency Add 2 additional days to program exercise sessions. Add 2 additional days to program exercise sessions. Add 2 additional days to program exercise  sessions.        Exercise Comments:     Exercise Comments    Row Name 04/26/16 0913           Exercise Comments Reviewed METs and goals. Pt is tolerating exercise well; will continue to monitor exercise progression          Discharge Exercise Prescription (Final Exercise Prescription Changes):     Exercise Prescription Changes - 05/16/16 1100      Exercise Review   Progression Yes     Response to Exercise   Blood Pressure (Admit) 118/78   Blood Pressure (Exercise) 158/82   Blood Pressure (Exit) 122/68   Heart Rate (Admit) 79 bpm   Heart Rate (Exercise) 142 bpm   Heart Rate (Exit) 86 bpm   Rating of Perceived Exertion (Exercise) 12   Comments reviewed HEP on 03/28/16     Progression   Average METs 4.3     Resistance Training   Training Prescription Yes   Weight 5lbs   Reps 10-12     Bike   Level 1.8   Minutes 10   METs 3.83     NuStep   Level 5   Minutes 10   METs 5     Track   Laps 17   Minutes 10   METs 3.95     Home Exercise Plan   Plans to continue exercise at Home  reviewed HEP on 03/28/16   Frequency Add 2 additional days to program exercise sessions.      Nutrition:  Target Goals: Understanding of nutrition guidelines, daily intake of sodium '1500mg'$ , cholesterol '200mg'$ , calories 30% from fat and 7% or less from saturated fats, daily to have 5 or more servings of fruits and vegetables.  Biometrics:     Pre Biometrics - 03/17/16 1434      Pre Biometrics   Waist Circumference 49 inches   Hip Circumference 42.25 inches   Waist to Hip Ratio 1.16 %   Triceps Skinfold 28 mm   % Body Fat 37.4 %   Grip Strength 49.5 kg   Flexibility 8.75 in   Single Leg Stand 30 seconds       Nutrition Therapy Plan and Nutrition Goals:     Nutrition Therapy & Goals - 03/21/16 1052      Nutrition Therapy   Diet Carb Modified, Therapeutic Lifestyle Change     Personal Nutrition Goals   Personal Goal #1 1-2 lb wt loss/week to a wt loss goal of  6-24 lb     Intervention Plan   Intervention Prescribe, educate and counsel regarding individualized specific dietary modifications aiming towards targeted core components such as weight, hypertension, lipid management, diabetes, heart failure and other comorbidities.   Expected Outcomes Short Term Goal: Understand basic principles of dietary content, such as calories, fat, sodium, cholesterol and nutrients.;Long Term Goal: Adherence to prescribed nutrition plan.      Nutrition Discharge: Nutrition Scores:     Nutrition Assessments - 04/13/16 8527  MEDFICTS Scores   Pre Score 71      Nutrition Goals Re-Evaluation:   Psychosocial: Target Goals: Acknowledge presence or absence of depression, maximize coping skills, provide positive support system. Participant is able to verbalize types and ability to use techniques and skills needed for reducing stress and depression.  Initial Review & Psychosocial Screening:     Initial Psych Review & Screening - 03/21/16 1050      Initial Review   Current issues with Current Stress Concerns   Source of Stress Concerns Occupation   Comments pt reports having work related stress.     Family Dynamics   Good Support System? Yes   Comments Pt cares for wife who has      Barriers   Psychosocial barriers to participate in program The patient should benefit from training in stress management and relaxation.     Screening Interventions   Interventions Encouraged to exercise      Quality of Life Scores:     Quality of Life - 03/23/16 1013      Quality of Life Scores   GLOBAL Pre --  pt QOL scores somewhat altered by recent cardiac event.  pt denies significant or ongoing concerns.      PHQ-9: Recent Review Flowsheet Data    Depression screen [facility] 2/9 03/21/2016   Decreased Interest 0   Down, Depressed, Hopeless 0   PHQ - 2 Score 0      Psychosocial Evaluation and Intervention:     Psychosocial Evaluation - 05/26/16 1301       Psychosocial Evaluation & Interventions   Interventions Encouraged to exercise with the program and follow exercise prescription   Comments no psychosocial needs identified, no intervention necessary    Continued Psychosocial Services Needed No     Discharge Psychosocial Assessment & Intervention   Discharge Continue support measures as needed      Psychosocial Re-Evaluation:     Psychosocial Re-Evaluation    [place] Name 03/30/16 [place] 04/26/16 0943           Psychosocial Re-Evaluation   Interventions Encouraged to attend Cardiac Rehabilitation for the exercise Encouraged to attend Cardiac Rehabilitation for the exercise      Comments pt states he is adjusting to exercise lifestyle.  pt has decreased PO salt intake which has lowered resting BP  no psychosocial needs identified, no intervention necessary. pt is returning to work and has changed exercise class time to 6:46am. pt feels he has increased strength and stamina.  pt admits he often skips home exercise and has noticed weight gain.  pt encouraged to incorporate home exercise into his routine to decrease CAD risk factors and assist with weight loss efforts.      Continued Psychosocial Services Needed No No         Vocational Rehabilitation: Provide vocational rehab assistance to qualifying candidates.   Vocational Rehab Evaluation & Intervention:     Vocational Rehab - 03/17/16 1518      Initial Vocational Rehab Evaluation & Intervention   Assessment shows need for Vocational Rehabilitation No  Pt reports he may return back to work without any difficulty      Education: Education Goals: Education classes will be provided on a weekly basis, covering required topics. Participant will state understanding/return demonstration of topics presented.  Learning Barriers/Preferences:     Learning Barriers/Preferences - 03/17/16 0913      Learning Barriers/Preferences   Learning Barriers Sight  reading glasses    Learning Preferences Pictoral;Skilled  Demonstration      Education Topics: Count Your Pulse:  -Group instruction provided by verbal instruction, demonstration, patient participation and written materials to support subject.  Instructors address importance of being able to find your pulse and how to count your pulse when at home without a heart monitor.  Patients get hands on experience counting their pulse with staff help and individually. Flowsheet Row CARDIAC REHAB PHASE II EXERCISE from 04/27/2016 in [facility]  Date  04/22/16  Educator  Andi Hence, RN  Instruction Review Code  2- meets goals/outcomes      Heart Attack, Angina, and Risk Factor Modification:  -Group instruction provided by verbal instruction, video, and written materials to support subject.  Instructors address signs and symptoms of angina and heart attacks.    Also discuss risk factors for heart disease and how to make changes to improve heart health risk factors. Flowsheet Row CARDIAC REHAB PHASE II EXERCISE from 04/27/2016 in [place]  Date  04/06/16  Instruction Review Code  2- meets goals/outcomes      Functional Fitness:  -Group instruction provided by verbal instruction, demonstration, patient participation, and written materials to support subject.  Instructors address safety measures for doing things around the house.  Discuss how to get up and down off the floor, how to pick things up properly, how to safely get out of a chair without assistance, and balance training. Flowsheet Row CARDIAC REHAB PHASE II EXERCISE from 04/27/2016 in [facility]  Date  04/08/16  Instruction Review Code  2- meets goals/outcomes      Meditation and Mindfulness:  -Group instruction provided by verbal instruction, patient participation, and written materials to support subject.  Instructor addresses importance of mindfulness and  meditation practice to help reduce stress and improve awareness.  Instructor also leads participants through a meditation exercise.  Flowsheet Row CARDIAC REHAB PHASE II EXERCISE from 04/27/2016 in [place]  Date  04/13/16  Instruction Review Code  2- meets goals/outcomes      Stretching for Flexibility and Mobility:  -Group instruction provided by verbal instruction, patient participation, and written materials to support subject.  Instructors lead participants through series of stretches that are designed to increase flexibility thus improving mobility.  These stretches are additional exercise for major muscle groups that are typically performed during regular warm up and cool down. Flowsheet Row CARDIAC REHAB PHASE II EXERCISE from 04/27/2016 in [place]  Date  04/15/16  Instruction Review Code  2- meets goals/outcomes      Hands Only CPR Anytime:  -Group instruction provided by verbal instruction, video, patient participation and written materials to support subject.  Instructors co-teach with AHA video for hands only CPR.  Participants get hands on experience with mannequins.   Nutrition I class: Heart Healthy Eating:  -Group instruction provided by PowerPoint slides, verbal discussion, and written materials to support subject matter. The instructor gives an explanation and review of the Therapeutic Lifestyle Changes diet recommendations, which includes a discussion on lipid goals, dietary fat, sodium, fiber, plant stanol/sterol esters, sugar, and the components of a well-balanced, healthy diet.   Nutrition II class: Lifestyle Skills:  -Group instruction provided by PowerPoint slides, verbal discussion, and written materials to support subject matter. The instructor gives an explanation and review of label reading, grocery shopping for heart health, heart healthy recipe modifications, and ways to make healthier choices  when  eating out. Flowsheet Row CARDIAC REHAB PHASE II EXERCISE from 04/27/2016 in [place]  Date  03/29/16  Educator  RD  Instruction Review Code  2- meets goals/outcomes      Diabetes Question & Answer:  -Group instruction provided by PowerPoint slides, verbal discussion, and written materials to support subject matter. The instructor gives an explanation and review of diabetes co-morbidities, pre- and post-prandial blood glucose goals, pre-exercise blood glucose goals, signs, symptoms, and treatment of hypoglycemia and hyperglycemia, and foot care basics. Flowsheet Row CARDIAC REHAB PHASE II EXERCISE from 04/27/2016 in [place]  Date  04/01/16  Educator  Derek Mound  Instruction Review Code  2- meets goals/outcomes      Diabetes Blitz:  -Group instruction provided by PowerPoint slides, verbal discussion, and written materials to support subject matter. The instructor gives an explanation and review of the physiology behind type 1 and type 2 diabetes, diabetes medications and rational behind using different medications, pre- and post-prandial blood glucose recommendations and Hemoglobin A1c goals, diabetes diet, and exercise including blood glucose guidelines for exercising safely.    Portion Distortion:  -Group instruction provided by PowerPoint slides, verbal discussion, written materials, and food models to support subject matter. The instructor gives an explanation of serving size versus portion size, changes in portions sizes over the last 20 years, and what consists of a serving from each food group. Flowsheet Row CARDIAC REHAB PHASE II EXERCISE from 04/27/2016 in [place]  Date  03/23/16  Educator  RD  Instruction Review Code  2- meets goals/outcomes      Stress Management:  -Group instruction provided by verbal instruction, video, and written materials to support subject matter.   Instructors review role of stress in heart disease and how to cope with stress positively.     Exercising on Your Own:  -Group instruction provided by verbal instruction, power point, and written materials to support subject.  Instructors discuss benefits of exercise, components of exercise, frequency and intensity of exercise, and end points for exercise.  Also discuss use of nitroglycerin and activating EMS.  Review options of places to exercise outside of rehab.  Review guidelines for sex with heart disease.   Cardiac Drugs I:  -Group instruction provided by verbal instruction and written materials to support subject.  Instructor reviews cardiac drug classes: antiplatelets, anticoagulants, beta blockers, and statins.  Instructor discusses reasons, side effects, and lifestyle considerations for each drug class. Flowsheet Row CARDIAC REHAB PHASE II EXERCISE from 04/27/2016 in [place]  Date  03/30/16  Educator  Clydia Llano [facility]  Instruction Review Code  2- meets goals/outcomes      Cardiac Drugs II:  -Group instruction provided by verbal instruction and written materials to support subject.  Instructor reviews cardiac drug classes: angiotensin converting enzyme inhibitors (ACE-I), angiotensin II receptor blockers (ARBs), nitrates, and calcium channel blockers.  Instructor discusses reasons, side effects, and lifestyle considerations for each drug class. Flowsheet Row CARDIAC REHAB PHASE II EXERCISE from 04/27/2016 in [place]  Date  04/27/16  Instruction Review Code  2- meets goals/outcomes      Anatomy and Physiology of the Circulatory System:  -Group instruction provided by verbal instruction, video, and written materials to support subject.  Reviews functional anatomy of heart, how it relates to various diagnoses, and what role the heart plays in the overall system. [place] REHAB PHASE II  EXERCISE from  04/27/2016 in [place]  Date  04/20/16  Instruction Review Code  2- meets goals/outcomes      Knowledge Questionnaire Score:     Knowledge Questionnaire Score - 03/21/16 1055      Knowledge Questionnaire Score   Pre Score 22/24     DM 14/15      Core Components/Risk Factors/Patient Goals at Admission:     Personal Goals and Risk Factors at Admission - 03/17/16 0916      Core Components/Risk Factors/Patient Goals on Admission    Weight Management Yes;Obesity;Weight Loss   Intervention Weight Management: Develop a combined nutrition and exercise program designed to reach desired caloric intake, while maintaining appropriate intake of nutrient and fiber, sodium and fats, and appropriate energy expenditure required for the weight goal.;Weight Management: Provide education and appropriate resources to help participant work on and attain dietary goals.;Weight Management/Obesity: Establish reasonable short term and long term weight goals.;Obesity: Provide education and appropriate resources to help participant work on and attain dietary goals.   Expected Outcomes Short Term: Continue to assess and modify interventions until short term weight is achieved;Long Term: Adherence to nutrition and physical activity/exercise program aimed toward attainment of established weight goal;Weight Maintenance: Understanding of the daily nutrition guidelines, which includes 25-35% calories from fat, 7% or less cal from saturated fats, less than '200mg'$  cholesterol, less than 1.5gm of sodium, & 5 or more servings of fruits and vegetables daily;Weight Loss: Understanding of general recommendations for a balanced deficit meal plan, which promotes 1-2 lb weight loss per week and includes a negative energy balance of [phone] kcal/d;Understanding recommendations for meals to include 15-35% energy as protein, 25-35% energy from fat, 35-60% energy from carbohydrates, less than '200mg'$  of  dietary cholesterol, 20-35 gm of total fiber daily;Understanding of distribution of calorie intake throughout the day with the consumption of 4-5 meals/snacks   Increase Strength and Stamina Yes   Intervention Provide advice, education, support and counseling about physical activity/exercise needs.;Develop an individualized exercise prescription for aerobic and resistive training based on initial evaluation findings, risk stratification, comorbidities and participant's personal goals.   Expected Outcomes Achievement of increased cardiorespiratory fitness and enhanced flexibility, muscular endurance and strength shown through measurements of functional capacity and personal statement of participant.   Improve shortness of breath with ADL's Yes   Intervention Provide education, individualized exercise plan and daily activity instruction to help decrease symptoms of SOB with activities of daily living.   Expected Outcomes Short Term: Achieves a reduction of symptoms when performing activities of daily living.   Diabetes Yes   Intervention Provide education about signs/symptoms and action to take for hypo/hyperglycemia.;Provide education about proper nutrition, including hydration, and aerobic/resistive exercise prescription along with prescribed medications to achieve blood glucose in normal ranges: Fasting glucose 65-99 mg/dL   Expected Outcomes Short Term: Participant verbalizes understanding of the signs/symptoms and immediate care of hyper/hypoglycemia, proper foot care and importance of medication, aerobic/resistive exercise and nutrition plan for blood glucose control.;Long Term: Attainment of HbA1C < 7%.   Hypertension Yes   Intervention Provide education on lifestyle modifcations including regular physical activity/exercise, weight management, moderate sodium restriction and increased consumption of fresh fruit, vegetables, and low fat dairy, alcohol moderation, and smoking cessation.;Monitor  prescription use compliance.   Expected Outcomes Short Term: Continued assessment and intervention until BP is < 140/49m HG in hypertensive participants. < 130/866mHG in hypertensive participants with diabetes, heart failure or chronic kidney disease.;Long Term: Maintenance of blood  pressure at goal levels.   Lipids Yes   Intervention Provide education and support for participant on nutrition & aerobic/resistive exercise along with prescribed medications to achieve LDL '70mg'$ , HDL >'40mg'$ .   Expected Outcomes Short Term: Participant states understanding of desired cholesterol values and is compliant with medications prescribed. Participant is following exercise prescription and nutrition guidelines.;Long Term: Cholesterol controlled with medications as prescribed, with individualized exercise RX and with personalized nutrition plan. Value goals: LDL < '70mg'$ , HDL > 40 mg.   Personal Goal Other Yes   Personal Goal Get back to work, improve lung capacity and lose wt ( short: 10lbs in 6 weeks and long: 40lbs in 4 months)   Intervention Provide nutrition and exercise counseling to improve cardiovascular fitness and assist with weightloss   Expected Outcomes Pt will be able to return to work, improve fitness levels and lose weight      Core Components/Risk Factors/Patient Goals Review:      Goals and Risk Factor Review    Row Name 04/26/16 0914 04/26/16 0919           Core Components/Risk Factors/Patient Goals Review   Personal Goals Review Other;Weight Management/Obesity  -      Review Pt weight is fluctuating. Pt has met with Nutritionist and Diabetes educator regarding weighloss tips and nutrition. Pt weight is fluctuating. Pt has met with Nutritionist and Diabetes educator regarding weighloss tips and nutrition. Pt returned to work on Nov. 6      Expected Outcomes Pt will continue to follow nutrition plan to assist with weighloss goals Pt will continue to follow nutrition plan to assist with  weighloss goals. Pt will be able to work without complications/difficulty         Core Components/Risk Factors/Patient Goals at Discharge (Final Review):      Goals and Risk Factor Review - 04/26/16 0919      Core Components/Risk Factors/Patient Goals Review   Review Pt weight is fluctuating. Pt has met with Nutritionist and Diabetes educator regarding weighloss tips and nutrition. Pt returned to work on Nov. 6   Expected Outcomes Pt will continue to follow nutrition plan to assist with weighloss goals. Pt will be able to work without complications/difficulty      ITP Comments:     ITP Comments    Row Name 03/17/16 0912 03/25/16 1350         ITP Comments Dr. Fransico Him, Medical Director Patient attended the "Taking the high out of high blood pressure" video/lecture education class on 03/25/16. Met outcomes/goals.         Comments: Pt is making expected progress toward personal goals after completing 24 sessions.Pt has returned to work and is doing well.  .Repeat Psychosocial Assessment: Pt with supportive wife denies any Psychosocial needs or interventions at this time. Recommend continued exercise and life style modification education including  stress management and relaxation Psychologist, clinical, BSN  techniques to decrease cardiac risk profile.

## 2016-05-27 ENCOUNTER — Encounter (HOSPITAL_COMMUNITY): Payer: BLUE CROSS/BLUE SHIELD

## 2016-05-27 ENCOUNTER — Encounter (HOSPITAL_COMMUNITY)
Admission: RE | Admit: 2016-05-27 | Discharge: 2016-05-27 | Disposition: A | Discharge status: Home / Self Care | Payer: BLUE CROSS/BLUE SHIELD | Source: Ambulatory Visit | Attending: Cardiology | Admitting: Cardiology

## 2016-05-27 DIAGNOSIS — Z951 Presence of aortocoronary bypass graft: Secondary | ICD-10-CM

## 2016-05-30 ENCOUNTER — Encounter (HOSPITAL_COMMUNITY): Payer: BLUE CROSS/BLUE SHIELD

## 2016-06-01 ENCOUNTER — Encounter (HOSPITAL_COMMUNITY): Payer: BLUE CROSS/BLUE SHIELD

## 2016-06-01 ENCOUNTER — Encounter (HOSPITAL_COMMUNITY)
Admission: RE | Admit: 2016-06-01 | Discharge: 2016-06-01 | Disposition: A | Discharge status: Home / Self Care | Payer: BLUE CROSS/BLUE SHIELD | Source: Ambulatory Visit | Attending: Cardiology | Admitting: Cardiology

## 2016-06-01 DIAGNOSIS — Z951 Presence of aortocoronary bypass graft: Secondary | ICD-10-CM | POA: Diagnosis not present

## 2016-06-03 ENCOUNTER — Encounter (HOSPITAL_COMMUNITY): Payer: BLUE CROSS/BLUE SHIELD

## 2016-06-06 ENCOUNTER — Encounter (HOSPITAL_COMMUNITY): Payer: BLUE CROSS/BLUE SHIELD

## 2016-06-06 ENCOUNTER — Encounter (HOSPITAL_COMMUNITY)
Admission: RE | Admit: 2016-06-06 | Discharge: 2016-06-06 | Disposition: A | Discharge status: Home / Self Care | Payer: BLUE CROSS/BLUE SHIELD | Source: Ambulatory Visit | Attending: Cardiology | Admitting: Cardiology

## 2016-06-06 DIAGNOSIS — Z951 Presence of aortocoronary bypass graft: Secondary | ICD-10-CM

## 2016-06-08 ENCOUNTER — Encounter (HOSPITAL_COMMUNITY): Payer: BLUE CROSS/BLUE SHIELD

## 2016-06-10 ENCOUNTER — Encounter (HOSPITAL_COMMUNITY): Payer: BLUE CROSS/BLUE SHIELD

## 2016-06-10 ENCOUNTER — Encounter (HOSPITAL_COMMUNITY)
Admission: RE | Admit: 2016-06-10 | Discharge: 2016-06-10 | Disposition: A | Discharge status: Home / Self Care | Payer: BLUE CROSS/BLUE SHIELD | Source: Ambulatory Visit | Attending: Cardiology | Admitting: Cardiology

## 2016-06-13 ENCOUNTER — Encounter (HOSPITAL_COMMUNITY): Payer: BLUE CROSS/BLUE SHIELD

## 2016-06-15 ENCOUNTER — Encounter (HOSPITAL_COMMUNITY): Payer: BLUE CROSS/BLUE SHIELD

## 2016-06-17 ENCOUNTER — Encounter (HOSPITAL_COMMUNITY): Admission: RE | Admit: 2016-06-17 | Payer: BLUE CROSS/BLUE SHIELD | Source: Ambulatory Visit

## 2016-06-17 ENCOUNTER — Encounter (HOSPITAL_COMMUNITY): Payer: BLUE CROSS/BLUE SHIELD

## 2016-06-20 ENCOUNTER — Encounter (HOSPITAL_COMMUNITY): Payer: BLUE CROSS/BLUE SHIELD

## 2016-06-22 ENCOUNTER — Encounter (HOSPITAL_COMMUNITY): Payer: BLUE CROSS/BLUE SHIELD

## 2016-06-23 ENCOUNTER — Encounter (HOSPITAL_COMMUNITY): Payer: Self-pay | Admitting: *Deleted

## 2016-06-23 NOTE — Progress Notes (Signed)
Cardiac Individual Treatment Plan  Patient Details  Name: Dennis Hopkins MRN: [id number] Date of Birth: April 05, 1961 Referring Provider:   Flowsheet Row CARDIAC REHAB PHASE II ORIENTATION from 03/17/2016 in [place]  Referring Provider  Kirk Ruths, MD      Initial Encounter Date:  [place] PHASE II ORIENTATION from 03/17/2016 in [place]  Date  03/17/16  Referring Provider  Kirk Ruths, MD      Visit Diagnosis: No diagnosis found.  Patient's Home Medications on Admission:  Current Outpatient Prescriptions:  .  atorvastatin (LIPITOR) 80 MG tablet, Take 1 tablet (80 mg total) by mouth daily., Disp: 30 tablet, Rfl: 10 .  clopidogrel (PLAVIX) 75 MG tablet, Take 1 tablet (75 mg total) by mouth daily., Disp: 90 tablet, Rfl: 3 .  metFORMIN (GLUCOPHAGE) 500 MG tablet, Take 1 tablet (500 mg total) by mouth 2 (two) times daily with a meal., Disp: 60 tablet, Rfl: 2 .  metoprolol (LOPRESSOR) 50 MG tablet, Take 1 tablet (50 mg total) by mouth 2 (two) times daily., Disp: 60 tablet, Rfl: 10 .  traMADol (ULTRAM) 50 MG tablet, Take 1 tablet (50 mg total) by mouth every 8 (eight) hours as needed., Disp: 30 tablet, Rfl: 0  Past Medical History: Past Medical History:  Diagnosis Date  . Chickenpox   . Hyperlipemia   . Hypertension   . Kidney stones 2013    Tobacco Use: History  Smoking Status  . Former Smoker  . Packs/day: 2.00  . Years: 35.00  . Types: Cigarettes  . Quit date: 12/21/2013  Smokeless Tobacco  . Never Used    Labs: Recent Review Flowsheet Data    Labs for ITP Cardiac and Pulmonary Rehab Latest Ref Rng & Units 01/13/2016 01/13/2016 01/13/2016 01/14/2016 01/16/2016   Cholestrol 0 - 200 mg/dL - - - - -   LDLCALC 0 - 99 mg/dL - - - - -   LDLDIRECT mg/dL - - - - -   HDL >40 mg/dL - - - - -   Trlycerides <150 mg/dL - - - - -   Hemoglobin A1c 4.8 - 5.6 % - - - - 6.8(H)   PHART 7.350 -  7.450 7.334(L) 7.324(L) - - -   PCO2ART 35.0 - 45.0 mmHg 43.0 45.0 - - -   HCO3 20.0 - 24.0 mEq/L 22.9 23.3 - - -   TCO2 0 - 100 mmol/L 24 25 24 28  -   ACIDBASEDEF 0.0 - 2.0 mmol/L 3.0(H) 3.0(H) - - -   O2SAT % 99.0 99.0 - - -      Capillary Blood Glucose: Lab Results  Component Value Date   GLUCAP 144 (H) 04/01/2016   GLUCAP 126 (H) 03/28/2016   GLUCAP 117 (H) 03/25/2016   GLUCAP 153 (H) 03/23/2016   GLUCAP 135 (H) 03/23/2016     Exercise Target Goals:    Exercise Program Goal: Individual exercise prescription set with THRR, safety & activity barriers. Participant demonstrates ability to understand and report RPE using BORG scale, to self-measure pulse accurately, and to acknowledge the importance of the exercise prescription.  Exercise Prescription Goal: Starting with aerobic activity 30 plus minutes a day, 3 days per week for initial exercise prescription. Provide home exercise prescription and guidelines that participant acknowledges understanding prior to discharge.  Activity Barriers & Risk Stratification:   6 Minute Walk:   Initial Exercise Prescription:   Perform Capillary Blood Glucose checks as needed.  Exercise  Prescription Changes:     Exercise Prescription Changes    Row Name 04/26/16 0900 05/16/16 1100 06/09/16 1200         Exercise Review   Progression Yes Yes Yes       Response to Exercise   Blood Pressure (Admit) 132/84 118/78 148/86     Blood Pressure (Exercise) 162/80 158/82 178/98     Blood Pressure (Exit) 108/70 122/68 118/70     Heart Rate (Admit) 78 bpm 79 bpm 67 bpm     Heart Rate (Exercise) 136 bpm 142 bpm 137 bpm     Heart Rate (Exit) 87 bpm 86 bpm 67 bpm     Rating of Perceived Exertion (Exercise) 12 12 12      Comments reviewed HEP on 03/28/16 reviewed HEP on 03/28/16 reviewed HEP on 03/28/16       Progression   Average METs 4 4.3 4.4       Resistance Training   Training Prescription Yes Yes Yes     Weight 5lbs 5lbs 5lbs      Reps 10-12 10-12 10-12       Bike   Level 1.8 1.8 1.8     Minutes 10 10 10      METs 3.83 3.83 3.83       NuStep   Level 5 5 5      Minutes 10 10 10      METs 4.3 5 5.3       Track   Laps 18 17 18      Minutes 10 10 10      METs 4.14 3.95 4.14       Home Exercise Plan   Plans to continue exercise at Home  reviewed HEP on 03/28/16 Home  reviewed HEP on 03/28/16 Home  reviewed HEP on 03/28/16     Frequency Add 2 additional days to program exercise sessions. Add 2 additional days to program exercise sessions. Add 2 additional days to program exercise sessions.        Exercise Comments:     Exercise Comments    Row Name 04/26/16 0913 06/22/16 0815         Exercise Comments Reviewed METs and goals. Pt is tolerating exercise well; will continue to monitor exercise progression Have not reviewed METs and goals with pt. Pt has been absent from CR for two or more weeks          Discharge Exercise Prescription (Final Exercise Prescription Changes):     Exercise Prescription Changes - 06/09/16 1200      Exercise Review   Progression Yes     Response to Exercise   Blood Pressure (Admit) 148/86   Blood Pressure (Exercise) 178/98   Blood Pressure (Exit) 118/70   Heart Rate (Admit) 67 bpm   Heart Rate (Exercise) 137 bpm   Heart Rate (Exit) 67 bpm   Rating of Perceived Exertion (Exercise) 12   Comments reviewed HEP on 03/28/16     Progression   Average METs 4.4     Resistance Training   Training Prescription Yes   Weight 5lbs   Reps 10-12     Bike   Level 1.8   Minutes 10   METs 3.83     NuStep   Level 5   Minutes 10   METs 5.3     Track   Laps 18   Minutes 10   METs 4.14     Home Exercise Plan   Plans to continue exercise at Home  reviewed  HEP on 03/28/16   Frequency Add 2 additional days to program exercise sessions.      Nutrition:  Target Goals: Understanding of nutrition guidelines, daily intake of sodium <1546m, cholesterol <2080m calories 30% from  fat and 7% or less from saturated fats, daily to have 5 or more servings of fruits and vegetables.  Biometrics:    Nutrition Therapy Plan and Nutrition Goals:   Nutrition Discharge: Nutrition Scores:   Nutrition Goals Re-Evaluation:   Psychosocial: Target Goals: Acknowledge presence or absence of depression, maximize coping skills, provide positive support system. Participant is able to verbalize types and ability to use techniques and skills needed for reducing stress and depression.  Initial Review & Psychosocial Screening:   Quality of Life Scores:   PHQ-9: Recent Review Flowsheet Data    [facility]   Decreased Interest 0   Down, Depressed, Hopeless 0   PHQ - 2 Score 0      Psychosocial Evaluation and Intervention:     Psychosocial Evaluation - 06/23/16 1237      Psychosocial Evaluation & Interventions   Interventions Encouraged to exercise with the program and follow exercise prescription   Comments no psychosocial needs identified, no intervention necessary    Continued Psychosocial Services Needed No     Discharge Psychosocial Assessment & Intervention   Discharge Continue support measures as needed      Psychosocial Re-Evaluation:     Psychosocial Re-Evaluation    Row Name 04/26/16 0943 06/23/16 1237           Psychosocial Re-Evaluation   Interventions Encouraged to attend Cardiac Rehabilitation for the exercise Encouraged to attend Cardiac Rehabilitation for the exercise      Comments no psychosocial needs identified, no intervention necessary. pt is returning to work and has changed exercise class time to 6:46am. pt feels he has increased strength and stamina.  pt admits he often skips home exercise and has noticed weight gain.  pt encouraged to incorporate home exercise into his routine to decrease CAD risk factors and assist with weight loss efforts. no psychosocial needs identified, no intervention necessary. Pt with  supportive family.      Continued Psychosocial Services Needed No No         Vocational Rehabilitation: Provide vocational rehab assistance to qualifying candidates.   Vocational Rehab Evaluation & Intervention:   Education: Education Goals: Education classes will be provided on a weekly basis, covering required topics. Participant will state understanding/return demonstration of topics presented.  Learning Barriers/Preferences:   Education Topics: Count Your Pulse:  -Group instruction provided by verbal instruction, demonstration, patient participation and written materials to support subject.  Instructors address importance of being able to find your pulse and how to count your pulse when at home without a heart monitor.  Patients get hands on experience counting their pulse with staff help and individually. Flowsheet Row CARDIAC REHAB PHASE II [place]ERC[place]  04/[place]/16  Educator  JoAndi HenceRN  Instruction Review Code  2- meets goals/outcomes      Heart Attack, Angina, and Risk Factor Modification:  -Group instruction provided by verbal instruction, video, and written materials to support subject.  Instructors address signs and symptoms of angina and heart attacks.    Also discuss risk factors for heart disease and how to make changes to improve heart health risk factors. Flowsheet Row CARDIAC REHAB PHASE [place] EXERC[place]  [facility]  Date  04/06/16  Instruction Review Code  2- meets goals/outcomes      Functional Fitness:  -Group instruction provided by verbal instruction, demonstration, patient participation, and written materials to support subject.  Instructors address safety measures for doing things around the house.  Discuss how to get up and down off the floor, how to pick things up properly, how to safely get out of a chair without assistance, and balance  training. Flowsheet Row CARDIAC REHAB PHASE II EXERCISE from 04/27/2016 in [place]  Date  04/08/16  Instruction Review Code  2- meets goals/outcomes      Meditation and Mindfulness:  -Group instruction provided by verbal instruction, patient participation, and written materials to support subject.  Instructor addresses importance of mindfulness and meditation practice to help reduce stress and improve awareness.  Instructor also leads participants through a meditation exercise.  Flowsheet Row CARDIAC REHAB PHASE II EXERCISE from 04/27/2016 in [place]  Date  04/13/16  Instruction Review Code  2- meets goals/outcomes      Stretching for Flexibility and Mobility:  -Group instruction provided by verbal instruction, patient participation, and written materials to support subject.  Instructors lead participants through series of stretches that are designed to increase flexibility thus improving mobility.  These stretches are additional exercise for major muscle groups that are typically performed during regular warm up and cool down. Flowsheet Row CARDIAC REHAB PHASE II EXERCISE from 04/27/2016 in [place]  Date  04/15/16  Instruction Review Code  2- meets goals/outcomes      Hands Only CPR Anytime:  -Group instruction provided by verbal instruction, video, patient participation and written materials to support subject.  Instructors co-teach with AHA video for hands only CPR.  Participants get hands on experience with mannequins.   Nutrition I class: Heart Healthy Eating:  -Group instruction provided by PowerPoint slides, verbal discussion, and written materials to support subject matter. The instructor gives an explanation and review of the Therapeutic Lifestyle Changes diet recommendations, which includes a discussion on lipid goals, dietary fat, sodium, fiber, plant stanol/sterol esters,  sugar, and the components of a well-balanced, healthy diet.   Nutrition II class: Lifestyle Skills:  -Group instruction provided by PowerPoint slides, verbal discussion, and written materials to support subject matter. The instructor gives an explanation and review of label reading, grocery shopping for heart health, heart healthy recipe modifications, and ways to make healthier choices when eating out. Flowsheet Row CARDIAC REHAB PHASE II EXERCISE from 04/27/2016 in [place]  Date  03/29/16  Educator  RD  Instruction Review Code  2- meets goals/outcomes      Diabetes Question & Answer:  -Group instruction provided by PowerPoint slides, verbal discussion, and written materials to support subject matter. The instructor gives an explanation and review of diabetes co-morbidities, pre- and post-prandial blood glucose goals, pre-exercise blood glucose goals, signs, symptoms, and treatment of hypoglycemia and hyperglycemia, and foot care basics. Flowsheet Row CARDIAC REHAB PHASE II EXERCISE from 04/27/2016 in [place]  Date  04/01/16  Educator  Derek Mound  Instruction Review Code  2- meets goals/outcomes      Diabetes Blitz:  -Group instruction provided by PowerPoint slides, verbal discussion, and written materials to support subject matter. The instructor gives an explanation and review of the physiology behind type 1 and type 2 diabetes, diabetes medications and rational behind using different medications,  pre- and post-prandial blood glucose recommendations and Hemoglobin A1c goals, diabetes diet, and exercise including blood glucose guidelines for exercising safely.    Portion Distortion:  -Group instruction provided by PowerPoint slides, verbal discussion, written materials, and food models to support subject matter. The instructor gives an explanation of serving size versus portion size, changes in portions sizes over the  last 20 years, and what consists of a serving from each food group. Flowsheet Row CARDIAC REHAB PHASE II EXERCISE from 04/27/2016 in [place]  Date  03/23/16  Educator  RD  Instruction Review Code  2- meets goals/outcomes      Stress Management:  -Group instruction provided by verbal instruction, video, and written materials to support subject matter.  Instructors review role of stress in heart disease and how to cope with stress positively.     Exercising on Your Own:  -Group instruction provided by verbal instruction, power point, and written materials to support subject.  Instructors discuss benefits of exercise, components of exercise, frequency and intensity of exercise, and end points for exercise.  Also discuss use of nitroglycerin and activating EMS.  Review options of places to exercise outside of rehab.  Review guidelines for sex with heart disease.   Cardiac Drugs I:  -Group instruction provided by verbal instruction and written materials to support subject.  Instructor reviews cardiac drug classes: antiplatelets, anticoagulants, beta blockers, and statins.  Instructor discusses reasons, side effects, and lifestyle considerations for each drug class. Flowsheet Row CARDIAC REHAB PHASE II EXERCISE from 04/27/2016 in [place]  Date  03/30/16  Educator  Clydia Llano [facility]  Instruction Review Code  2- meets goals/outcomes      Cardiac Drugs II:  -Group instruction provided by verbal instruction and written materials to support subject.  Instructor reviews cardiac drug classes: angiotensin converting enzyme inhibitors (ACE-I), angiotensin II receptor blockers (ARBs), nitrates, and calcium channel blockers.  Instructor discusses reasons, side effects, and lifestyle considerations for each drug class. Flowsheet Row CARDIAC REHAB PHASE II EXERCISE from 04/27/2016 in [place]  Date  04/27/16   Instruction Review Code  2- meets goals/outcomes      Anatomy and Physiology of the Circulatory System:  -Group instruction provided by verbal instruction, video, and written materials to support subject.  Reviews functional anatomy of heart, how it relates to various diagnoses, and what role the heart plays in the overall system. Flowsheet Row CARDIAC REHAB PHASE II EXERCISE from 04/27/2016 in [place]  Date  04/20/16  Instruction Review Code  2- meets goals/outcomes      Knowledge Questionnaire Score:   Core Components/Risk Factors/Patient Goals at Admission:   Core Components/Risk Factors/Patient Goals Review:      Goals and Risk Factor Review    Row Name 04/26/16 0914 04/26/16 0919 06/09/16 1239         Core Components/Risk Factors/Patient Goals Review   Personal Goals Review Other;Weight Management/Obesity  - Other;Weight Management/Obesity     Review Pt weight is fluctuating. Pt has met with Nutritionist and Diabetes educator regarding weighloss tips and nutrition. Pt weight is fluctuating. Pt has met with Nutritionist and Diabetes educator regarding weighloss tips and nutrition. Pt returned to work on Nov. 6 Pt weight is still fluctuating and pt is not consistent with attendace to CRPII.  pt states work someitimes gets in the way but he is exercising at home 3 days/week for 30 min/day  Expected Outcomes Pt will continue to follow nutrition plan to assist with weighloss goals Pt will continue to follow nutrition plan to assist with weighloss goals. Pt will be able to work without complications/difficulty Pt will continue to follow nutrition plan to assist with weighloss goals. Pt will be able to work without complications/difficulty        Core Components/Risk Factors/Patient Goals at Discharge (Final Review):      Goals and Risk Factor Review - 06/09/16 1239      Core Components/Risk Factors/Patient Goals Review   Personal Goals  Review Other;Weight Management/Obesity   Review Pt weight is still fluctuating and pt is not consistent with attendace to CRPII.  pt states work someitimes gets in the way but he is exercising at home 3 days/week for 30 min/day   Expected Outcomes Pt will continue to follow nutrition plan to assist with weighloss goals. Pt will be able to work without complications/difficulty      ITP Comments:   Comments:  Pt is making expected progress toward personal goals after completing 21 sessions. Pt often pushes himself too hard for heart health. Repeat Psychosocial Assessment reveals no changes from previous assessment. Pt interacting positively with fellow participants.  Pt feels he is making progress toward meeting his goals.  Pt has returned to work and averages 1 -2 exercise sessions a week. Recommend continued exercise and life style modification education including  stress management and relaxation techniques to decrease cardiac risk profile. Cherre Huger, BSN

## 2016-06-24 ENCOUNTER — Encounter (HOSPITAL_COMMUNITY)
Admission: RE | Admit: 2016-06-24 | Discharge: 2016-06-24 | Disposition: A | Discharge status: Home / Self Care | Payer: BLUE CROSS/BLUE SHIELD | Source: Ambulatory Visit | Attending: Cardiology | Admitting: Cardiology

## 2016-06-24 ENCOUNTER — Encounter (HOSPITAL_COMMUNITY): Payer: BLUE CROSS/BLUE SHIELD

## 2016-06-24 DIAGNOSIS — Z951 Presence of aortocoronary bypass graft: Secondary | ICD-10-CM | POA: Insufficient documentation

## 2016-06-27 ENCOUNTER — Encounter (HOSPITAL_COMMUNITY): Payer: BLUE CROSS/BLUE SHIELD

## 2016-06-28 ENCOUNTER — Other Ambulatory Visit: Payer: Self-pay | Admitting: Family Medicine

## 2016-06-28 DIAGNOSIS — E1151 Type 2 diabetes mellitus with diabetic peripheral angiopathy without gangrene: Secondary | ICD-10-CM

## 2016-06-28 DIAGNOSIS — IMO0002 Reserved for concepts with insufficient information to code with codable children: Secondary | ICD-10-CM

## 2016-06-28 DIAGNOSIS — E1165 Type 2 diabetes mellitus with hyperglycemia: Principal | ICD-10-CM

## 2016-06-29 ENCOUNTER — Encounter (HOSPITAL_COMMUNITY): Payer: BLUE CROSS/BLUE SHIELD

## 2016-07-01 ENCOUNTER — Encounter (HOSPITAL_COMMUNITY): Payer: BLUE CROSS/BLUE SHIELD

## 2016-07-11 ENCOUNTER — Telehealth: Payer: Self-pay

## 2016-07-11 NOTE — Telephone Encounter (Signed)
Received Long Term Disability paperwork. In order to complete, pt will need an appt.  Please call patient to schedule for an OV.

## 2016-07-11 NOTE — Telephone Encounter (Signed)
Patient scheduled for 07/19/16

## 2016-07-14 ENCOUNTER — Encounter (HOSPITAL_COMMUNITY): Payer: Self-pay | Admitting: *Deleted

## 2016-07-14 ENCOUNTER — Telehealth (HOSPITAL_COMMUNITY): Payer: Self-pay | Admitting: *Deleted

## 2016-07-14 NOTE — Telephone Encounter (Signed)
Pt called regarding returning back to exercise.  Pt last exercise session was on 12/18.  Pt indicated that he did not plan to return due to work schedule difficulties.  Advised pt that rehab would send postage paid envelope for him to return his parking badge.  Pt indicated that he would.  Alanson Alyarlette Ernestine Langworthy RN, BSN

## 2016-07-14 NOTE — Progress Notes (Signed)
Discharge Summary  Patient Details  Name: Dennis Hopkins MRN: [id number] Date of Birth: Nov 08, 1960 Referring Provider:   Flowsheet Row CARDIAC REHAB PHASE II ORIENTATION from 03/17/2016 in [place]  Referring Provider  Kirk Ruths, MD       Number of Visits: 27  Reason for Discharge:  Early Exit:  Back to work  Smoking History:  History  Smoking Status  . Former Smoker  . Packs/day: 2.00  . Years: 35.00  . Types: Cigarettes  . Quit date: 12/21/2013  Smokeless Tobacco  . Never Used    Diagnosis:  No diagnosis found.  ADL UCSD:   Initial Exercise Prescription:   Discharge Exercise Prescription (Final Exercise Prescription Changes):     Exercise Prescription Changes - 07/20/16 1600      Exercise Review   Progression Yes     Response to Exercise   Blood Pressure (Admit) 128/80   Blood Pressure (Exercise) 188/86   Blood Pressure (Exit) 138/86   Heart Rate (Admit) 61 bpm   Heart Rate (Exercise) 150 bpm   Heart Rate (Exit) 72 bpm   Rating of Perceived Exertion (Exercise) 12   Comments reviewed HEP on 03/28/16     Progression   Average METs 4.4     Resistance Training   Training Prescription Yes   Weight 6lbs   Reps 10-12     Bike   Level 1.8   Minutes 10   METs 3.83     NuStep   Level 5   Minutes 10   METs 5.3     Track   Laps 18   Minutes 10   METs 4.14     Home Exercise Plan   Plans to continue exercise at Home  reviewed HEP on 03/28/16   Frequency Add 2 additional days to program exercise sessions.      Functional Capacity:   Psychological, QOL, Others - Outcomes: PHQ 2/9: Depression screen PHQ 2/9 03/21/2016  Decreased Interest 0  Down, Depressed, Hopeless 0  PHQ - 2 Score 0    Quality of Life:   Personal Goals: Goals established at orientation with interventions provided to work toward goal.    Personal Goals Discharge:     Goals and Risk Factor Review    Row Name 06/09/16 1239             Core Components/Risk Factors/Patient Goals Review   Personal Goals Review Other;Weight Management/Obesity       Review Pt weight is still fluctuating and pt is not consistent with attendace to CRPII.  pt states work someitimes gets in the way but he is exercising at home 3 days/week for 30 min/day       Expected Outcomes Pt will continue to follow nutrition plan to assist with weighloss goals. Pt will be able to work without complications/difficulty          Nutrition & Weight - Outcomes:      Post Biometrics - 07/20/16 1649       Post  Biometrics   Weight 264 lb 8.8 oz (120 kg)      Nutrition:   Nutrition Discharge:   Education Questionnaire Score:   Goals reviewed with patient. Pt graduated from cardiac rehab program today with completion of 27 exercise sessions in Phase II. Pt maintained good attendance and progressed nicely during his participation in rehab as evidenced by increased MET level. Pt advised to use caution when exercising.  Pt would often push hard  to where he would exceed his target heart rate.  Pt at times would not decrease his exertional level even when asked several times.  Unable to reconcile  medication list. Unable to complete any post assessment screenings.  Pt feels he has achieved his goals during cardiac rehab.   Pt plans to continue exercise on his own. Mattheus Rauls Elana Alm, BSN  .

## 2016-07-19 ENCOUNTER — Telehealth: Payer: Self-pay

## 2016-07-19 ENCOUNTER — Ambulatory Visit (INDEPENDENT_AMBULATORY_CARE_PROVIDER_SITE_OTHER): Payer: BLUE CROSS/BLUE SHIELD | Admitting: Family Medicine

## 2016-07-19 ENCOUNTER — Encounter: Payer: Self-pay | Admitting: Family Medicine

## 2016-07-19 VITALS — BP 158/88 | HR 71 | Temp 98.7°F | Ht 72.0 in | Wt 279.4 lb

## 2016-07-19 DIAGNOSIS — I25118 Atherosclerotic heart disease of native coronary artery with other forms of angina pectoris: Secondary | ICD-10-CM

## 2016-07-19 DIAGNOSIS — E1151 Type 2 diabetes mellitus with diabetic peripheral angiopathy without gangrene: Secondary | ICD-10-CM | POA: Insufficient documentation

## 2016-07-19 DIAGNOSIS — E119 Type 2 diabetes mellitus without complications: Secondary | ICD-10-CM | POA: Insufficient documentation

## 2016-07-19 DIAGNOSIS — I10 Essential (primary) hypertension: Secondary | ICD-10-CM

## 2016-07-19 DIAGNOSIS — G5602 Carpal tunnel syndrome, left upper limb: Secondary | ICD-10-CM

## 2016-07-19 DIAGNOSIS — M25512 Pain in left shoulder: Secondary | ICD-10-CM

## 2016-07-19 DIAGNOSIS — E1169 Type 2 diabetes mellitus with other specified complication: Secondary | ICD-10-CM

## 2016-07-19 DIAGNOSIS — Z794 Long term (current) use of insulin: Secondary | ICD-10-CM | POA: Insufficient documentation

## 2016-07-19 DIAGNOSIS — E1165 Type 2 diabetes mellitus with hyperglycemia: Secondary | ICD-10-CM

## 2016-07-19 DIAGNOSIS — IMO0002 Reserved for concepts with insufficient information to code with codable children: Secondary | ICD-10-CM | POA: Insufficient documentation

## 2016-07-19 DIAGNOSIS — Z951 Presence of aortocoronary bypass graft: Secondary | ICD-10-CM | POA: Diagnosis not present

## 2016-07-19 DIAGNOSIS — E785 Hyperlipidemia, unspecified: Secondary | ICD-10-CM

## 2016-07-19 DIAGNOSIS — Z131 Encounter for screening for diabetes mellitus: Secondary | ICD-10-CM

## 2016-07-19 HISTORY — DX: Type 2 diabetes mellitus without complications: Z79.4

## 2016-07-19 HISTORY — DX: Type 2 diabetes mellitus without complications: E11.9

## 2016-07-19 MED ORDER — METOPROLOL TARTRATE 50 MG PO TABS: ORAL_TABLET | ORAL | 5 refills | Status: DC

## 2016-07-19 MED ORDER — TRAMADOL HCL 50 MG PO TABS: 50.0000 mg | ORAL_TABLET | Freq: Three times a day (TID) | ORAL | 0 refills | Status: DC | PRN

## 2016-07-19 NOTE — Telephone Encounter (Signed)
Per Dr. Laury AxonLowne Chase's instructions, to cancel the Rx for Metoprolol 50mg  (1 tab po in the morning and 2 tabs po at night), [facility]

## 2016-07-19 NOTE — Assessment & Plan Note (Signed)
Check labs con't meds Lipitor, plavix

## 2016-07-19 NOTE — Patient Instructions (Signed)
Coronary Artery Disease, Male °Coronary artery disease (CAD) is a process in which the blood vessels of the heart (coronary arteries) become narrow or blocked. The narrowing or blockage can lead to decreased blood flow to the heart muscle (angina). Prolonged reduced blood flow can cause a heart attack (myocardial infarction, MI). Because CAD is the leading cause of death in men, it is important to understand what causes this condition and how it is treated. °What are the causes? °Atherosclerosis is the cause of CAD. This is the buildup of fat and cholesterol (plaque) on the inside of the arteries. Over time, the plaque may narrow or block the artery, and this will lessen blood flow to the heart. Plaque can also become weak and break off within a coronary artery to form a clot and cause a sudden blockage. °What increases the risk? °Many risk factors increase your chances of getting CAD, including: °· High cholesterol levels. °· High blood pressure (hypertension). °· Tobacco use. °· Diabetes. °· Age. Men over age 45 are at a greater risk of CAD. °· Gender. Men often develop CAD earlier in life than women. °· Family history of CAD. °· Obesity. °· Lack of exercise. °· A diet high in saturated fats. °What are the signs or symptoms? °Many people do not experience any symptoms during the early stages of CAD. As the condition progresses, symptoms may include: °· Chest pain. °¨ The pain can be described as a crushing or squeezing in the chest, or a tightness, pressure, fullness, or heaviness in the chest. °¨ The pain can last more than a few minutes or can stop and recur. °· Pain in the arms, neck, jaw, or back. °· Unexplained heartburn or indigestion. °· Shortness of breath. °· Nausea. °· Sudden cold sweats. °Less common symptoms of CAD in men can include: °· Fatigue. °· Unexplained feelings of nervousness or anxiety. °· Weakness. °· Diarrhea. °· Sudden light-headedness. °How is this diagnosed? °Tests to diagnose CAD may  include: °· ECG (electrocardiogram). °· Exercise stress test. This looks for signs of blockage when the heart is being exercised. °· Pharmacologic stress test. This test looks for signs of blockage when the heart is being stressed with a medicine. °· Blood tests. °· Coronary angiogram. This is a procedure to look at the coronary arteries to see if there is any blockage. °How is this treated? °The treatment of CAD may include the following: °· Healthy behavioral changes to reduce or control risk factors. °· Medicine. °· Coronary stenting. A stent helps to keep an artery open. °· Coronary angioplasty. This procedure widens a narrowed or blocked artery. °· Coronary artery bypass surgery. This will allow your blood to pass the blockage (bypass) to reach your heart. °Follow these instructions at home: °· Take medicines only as directed by your health care provider. °· Do not take the following medicines unless your health care provider approves: °¨ Nonsteroidal anti-inflammatory drugs (NSAIDs), such as ibuprofen, naproxen, or celecoxib. °¨ Vitamin supplements that contain vitamin A, vitamin E, or both. °· Manage other health conditions such as hypertension and diabetes as directed by your health care provider. °· Follow a heart-healthy diet. A dietitian can help to educate you about healthy food options and changes. °· Use healthy cooking methods such as roasting, grilling, broiling, baking, poaching, steaming, or stir-frying. Talk to a dietitian to learn more about healthy cooking methods. °· Follow an exercise program approved by your health care provider. °· Maintain a healthy weight. Lose weight as approved by your health care   provider. °· Plan rest periods when fatigued. °· Learn to manage stress. °· Do not use any tobacco products, including cigarettes, chewing tobacco, or electronic cigarettes. If you need help quitting, ask your health care provider. °· If you drink alcohol, and your health care provider  approves, limit your alcohol intake to no more than 1 drink per day. One drink equals 12 ounces of beer, 5 ounces of wine, or 1½ ounces of hard liquor. °· Stop illegal drug use. °· Your health care provider may ask you to monitor your blood pressure. A blood pressure reading consists of a higher number over a lower number, such as 110 over 72, which is written as 110/72. Ideally, your blood pressure should be: °¨ Below 140/90 if you have no other medical conditions. °¨ Below 130/80 if you have diabetes or kidney disease. °· Keep all follow-up visits as directed by your health care provider. This is important. °Get help right away if: °· You have pain in your chest, neck, arm, jaw, stomach, or back that lasts more than a few minutes, is recurring, or is unrelieved by taking medicine under your tongue (sublingual nitroglycerin). °· You have profuse sweating without cause. °· You have unexplained: °¨ Heartburn or indigestion. °¨ Shortness of breath or difficulty breathing. °¨ Nausea or vomiting. °¨ Fatigue. °¨ Feelings of nervousness or anxiety. °¨ Weakness. °¨ Diarrhea. °· You have sudden light-headedness or dizziness. °· You faint. °These symptoms may represent a serious problem that is an emergency. Do not wait to see if the symptoms will go away. Get medical help right away. Call your local emergency services (911 in the [place] Do not drive yourself to the hospital.  °This information is not intended to replace advice given to you by your health care provider. Make sure you discuss any questions you have with your health care provider. °Document Released: 01/01/2014 Document Revised: 11/12/2015 Document Reviewed: 10/08/2013 °Elsevier Interactive Patient Education © 2017 [employer] ° °

## 2016-07-19 NOTE — Progress Notes (Signed)
Patient ID: Dennis Hopkins, male   DOB: 25-Aug-1960, 56 y.o.   MRN: [id number]

## 2016-07-19 NOTE — Progress Notes (Signed)
Pre visit review using our clinic review tool, if applicable. No additional management support is needed unless otherwise documented below in the visit note. 

## 2016-07-19 NOTE — Progress Notes (Signed)
Subjective:    Patient ID: Dennis Hopkins, male    DOB: 12/19/1960, 56 y.o.   MRN: [id number]  Chief Complaint  Patient presents with  . Paperwork    Longterm Disability.    HPI Patient is in today for assistance with his long term disability paperwork. No additional concerns noted for this visit.    Past Medical History:  Diagnosis Date  . Chickenpox   . Hyperlipemia   . Hypertension   . Kidney stones 2013    Past Surgical History:  Procedure Laterality Date  . CARDIAC CATHETERIZATION N/A 01/07/2016   Procedure: Left Heart Cath and Coronary Angiography;  Surgeon: Corky Crafts, MD;  Location: [facility] INVASIVE CV LAB;  Service: Cardiovascular;  Laterality: N/A;  . CORONARY ARTERY BYPASS GRAFT N/A 01/13/2016   Procedure: CORONARY ARTERY BYPASS GRAFTING (CABG) x4 Endoscopic Harvesting of the Right Greater Saphenous Vein;  Surgeon: Loreli Slot, MD;  Location: [facility] OR;  Service: Open Heart Surgery;  Laterality: N/A;  . TEE WITHOUT CARDIOVERSION N/A 01/13/2016   Procedure: TRANSESOPHAGEAL ECHOCARDIOGRAM (TEE);  Surgeon: Loreli Slot, MD;  Location: [facility] OR;  Service: Open Heart Surgery;  Laterality: N/A;  . TONSILLECTOMY      Family History  Problem Relation Age of Onset  . Arthritis Mother   . Arthritis Father   . Hyperlipidemia Father   . Diabetes Brother     Social History   Social History  . Marital status: Married    Spouse name: N/A  . Number of children: 1  . Years of education: N/A   Occupational History  . Not on file.   Social History Main Topics  . Smoking status: Former Smoker    Packs/day: 2.00    Years: 35.00    Types: Cigarettes    Quit date: 12/21/2013  . Smokeless tobacco: Never Used  . Alcohol use 0.0 oz/week     Comment: 1-2 drinks per night  . Drug use: No  . Sexual activity: Yes    Partners: Female   Other Topics Concern  . Not on file   Social History Narrative  . No narrative on file    Outpatient Medications Prior  to Visit  Medication Sig Dispense Refill  . atorvastatin (LIPITOR) 80 MG tablet Take 1 tablet (80 mg total) by mouth daily. 30 tablet 10  . clopidogrel (PLAVIX) 75 MG tablet Take 1 tablet (75 mg total) by mouth daily. 90 tablet 3  . metFORMIN (GLUCOPHAGE) 500 MG tablet TAKE ONE TABLET BY MOUTH TWICE DAILY WITH MEALS 180 tablet 1  . metoprolol (LOPRESSOR) 50 MG tablet Take 1 tablet (50 mg total) by mouth 2 (two) times daily. 60 tablet 10  . traMADol (ULTRAM) 50 MG tablet Take 1 tablet (50 mg total) by mouth every 8 (eight) hours as needed. 30 tablet 0   No facility-administered medications prior to visit.     Allergies  Allergen Reactions  . Asa [Aspirin] Anaphylaxis and Swelling    Lips swelling    Review of Systems  Constitutional: Negative for fever and malaise/fatigue.  HENT: Negative for congestion.   Eyes: Negative for blurred vision.  Respiratory: Negative for cough and shortness of breath.   Cardiovascular: Negative for chest pain, palpitations and leg swelling.  Gastrointestinal: Negative for vomiting.  Musculoskeletal: Negative for back pain.  Skin: Negative for rash.  Neurological: Negative for loss of consciousness and headaches.       Objective:    Physical Exam  Constitutional: He  is oriented to person, place, and time. He appears well-developed and well-nourished. No distress.  HENT:  Head: Normocephalic and atraumatic.  Eyes: Conjunctivae are normal.  Neck: Normal range of motion. No thyromegaly present.  Cardiovascular: Normal rate and regular rhythm.   Pulmonary/Chest: Effort normal and breath sounds normal. He has no wheezes.  Abdominal: Soft. Bowel sounds are normal. There is no tenderness.  Musculoskeletal: He exhibits no edema or deformity.  Neurological: He is alert and oriented to person, place, and time.  Skin: Skin is warm and dry. He is not diaphoretic.  Psychiatric: He has a normal mood and affect.    BP (!) 158/88 (BP Location: Right Arm,  Patient Position: Sitting, Cuff Size: Large)   Pulse 71   Temp 98.7 F (37.1 C) (Oral)   Ht 6' (1.829 m)   Wt 279 lb 6.4 oz (126.7 kg)   SpO2 99% Comment: RA  BMI 37.89 kg/m  Wt Readings from Last 3 Encounters:  07/19/16 279 lb 6.4 oz (126.7 kg)  04/06/16 262 lb 2 oz (118.9 kg)  03/17/16 263 lb 0.1 oz (119.3 kg)     Lab Results  Component Value Date   WBC 11.2 (H) 01/16/2016   HGB 10.2 (L) 01/16/2016   HCT 31.8 (L) 01/16/2016   PLT 189 01/16/2016   GLUCOSE 157 (H) 01/18/2016   CHOL 224 (H) 01/08/2016   TRIG 535 (H) 01/08/2016   HDL 30 (L) 01/08/2016   LDLDIRECT 161.0 09/03/2015   LDLCALC UNABLE TO CALCULATE IF TRIGLYCERIDE OVER 400 mg/dL 16/03/9603   ALT 34 54/02/8118   AST 25 01/08/2016   NA 133 (L) 01/18/2016   K 3.9 01/18/2016   CL 101 01/18/2016   CREATININE 0.95 01/18/2016   BUN 21 (H) 01/18/2016   CO2 23 01/18/2016   TSH 1.54 09/29/2014   INR 1.32 01/13/2016   HGBA1C 6.8 (H) 01/16/2016   MICROALBUR 1.1 09/29/2014    Lab Results  Component Value Date   TSH 1.54 09/29/2014   Lab Results  Component Value Date   WBC 11.2 (H) 01/16/2016   HGB 10.2 (L) 01/16/2016   HCT 31.8 (L) 01/16/2016   MCV 87.8 01/16/2016   PLT 189 01/16/2016   Lab Results  Component Value Date   NA 133 (L) 01/18/2016   K 3.9 01/18/2016   CO2 23 01/18/2016   GLUCOSE 157 (H) 01/18/2016   BUN 21 (H) 01/18/2016   CREATININE 0.95 01/18/2016   BILITOT 0.3 01/08/2016   ALKPHOS 44 01/08/2016   AST 25 01/08/2016   ALT 34 01/08/2016   PROT 6.3 (L) 01/08/2016   ALBUMIN 3.5 01/08/2016   CALCIUM 8.9 01/18/2016   ANIONGAP 9 01/18/2016   GFR 90.87 12/16/2015   Lab Results  Component Value Date   CHOL 224 (H) 01/08/2016   Lab Results  Component Value Date   HDL 30 (L) 01/08/2016   Lab Results  Component Value Date   LDLCALC UNABLE TO CALCULATE IF TRIGLYCERIDE OVER 400 mg/dL 14/78/2956   Lab Results  Component Value Date   TRIG 535 (H) 01/08/2016   Lab Results  Component  Value Date   CHOLHDL 7.5 01/08/2016   Lab Results  Component Value Date   HGBA1C 6.8 (H) 01/16/2016       Assessment & Plan:   Problem List Items Addressed This Visit      Unprioritized   Coronary artery disease    Check labs con't meds Lipitor, plavix      Relevant Medications  metoprolol (LOPRESSOR) 50 MG tablet   Other Relevant Orders   Ambulatory referral to Cardiology   Diabetes mellitus (HCC)   HTN (hypertension)    Elevated--- pt admitted to forgetting his pm dose of metoprolol most of the time Pt encouraged to set reminders on phone or leave notes to help him remember.  If he is unable to he will discuss possibility of change to qd med F/u cardiology       Relevant Medications   metoprolol (LOPRESSOR) 50 MG tablet   Other Relevant Orders   Comprehensive metabolic panel   Microalbumin / creatinine urine ratio   Hyperlipidemia    Check labs con't lipitor      Relevant Medications   metoprolol (LOPRESSOR) 50 MG tablet   S/P CABG x 4 - Primary    Per cardiology      Relevant Orders   Ambulatory referral to Cardiology    Other Visit Diagnoses    Carpal tunnel syndrome of left wrist       Relevant Medications   traMADol (ULTRAM) 50 MG tablet   Left shoulder pain, unspecified chronicity       Relevant Orders   AMB referral to orthopedics   Hyperlipidemia associated with type 2 diabetes mellitus (HCC)       Relevant Medications   metoprolol (LOPRESSOR) 50 MG tablet   Other Relevant Orders   Lipid panel   Diabetes mellitus screening       Relevant Orders   Hemoglobin A1c   Urinalysis      I have discontinued Mr. Luckman's metoprolol. I have also changed his metoprolol. Additionally, I am having him maintain his clopidogrel, atorvastatin, metFORMIN, and traMADol.  Meds ordered this encounter  Medications  . traMADol (ULTRAM) 50 MG tablet    Sig: Take 1 tablet (50 mg total) by mouth every 8 (eight) hours as needed.    Dispense:  30 tablet     Refill:  0  . DISCONTD: metoprolol (LOPRESSOR) 50 MG tablet    Sig: 1 po qam and 2 po qpm    Dispense:  90 tablet    Refill:  5    Please send future rx requests electronically  . metoprolol (LOPRESSOR) 50 MG tablet    Sig: 1 po bid    Dispense:  60 tablet    Refill:  5    Please send future rx requests electronically    CMA served as scribe during this visit. History, Physical and Plan performed by medical provider. Documentation and orders reviewed and attested to.   Donato SchultzYvonne R Lowne Chase, DO

## 2016-07-19 NOTE — Assessment & Plan Note (Signed)
Check labs con't lipitor  

## 2016-07-19 NOTE — Assessment & Plan Note (Signed)
Elevated--- pt admitted to forgetting his pm dose of metoprolol most of the time Pt encouraged to set reminders on phone or leave notes to help him remember.  If he is unable to he will discuss possibility of change to qd med F/u cardiology

## 2016-07-19 NOTE — Assessment & Plan Note (Addendum)
Check labs con't metformin With cad

## 2016-07-19 NOTE — Assessment & Plan Note (Signed)
Per cardiology 

## 2016-07-20 NOTE — Addendum Note (Signed)
Encounter addended by: Ples SpecterAshley L Herberto Ledwell on: 07/20/2016  4:53 PM<BR>    Actions taken: Flowsheet data copied forward, Flowsheet accepted, Visit Navigator Flowsheet section accepted

## 2016-07-21 NOTE — Addendum Note (Signed)
Encounter addended by: Jacques EarthlyEdna Brewbaker Sherion Dooly, RD on: 07/21/2016  9:09 AM<BR>    Actions taken: Flowsheet data copied forward, Visit Navigator Flowsheet section accepted

## 2016-07-28 ENCOUNTER — Encounter (HOSPITAL_COMMUNITY): Payer: Self-pay | Admitting: *Deleted

## 2016-07-29 ENCOUNTER — Other Ambulatory Visit (INDEPENDENT_AMBULATORY_CARE_PROVIDER_SITE_OTHER): Payer: Self-pay

## 2016-07-29 DIAGNOSIS — M25512 Pain in left shoulder: Secondary | ICD-10-CM

## 2016-08-01 ENCOUNTER — Ambulatory Visit (INDEPENDENT_AMBULATORY_CARE_PROVIDER_SITE_OTHER): Payer: Self-pay | Admitting: Orthopaedic Surgery

## 2016-08-01 ENCOUNTER — Other Ambulatory Visit (HOSPITAL_BASED_OUTPATIENT_CLINIC_OR_DEPARTMENT_OTHER): Payer: BLUE CROSS/BLUE SHIELD

## 2016-12-15 ENCOUNTER — Telehealth: Payer: Self-pay | Admitting: *Deleted

## 2016-12-15 NOTE — Telephone Encounter (Signed)
Received Registered Dietician Progress Note/Physician Orders from [facility], forwarded to provider/SLS 06/28

## 2017-03-03 ENCOUNTER — Other Ambulatory Visit: Payer: Self-pay | Admitting: Cardiology

## 2017-04-12 ENCOUNTER — Other Ambulatory Visit: Payer: Self-pay | Admitting: Family Medicine

## 2017-04-12 DIAGNOSIS — IMO0002 Reserved for concepts with insufficient information to code with codable children: Secondary | ICD-10-CM

## 2017-04-12 DIAGNOSIS — E1165 Type 2 diabetes mellitus with hyperglycemia: Principal | ICD-10-CM

## 2017-04-12 DIAGNOSIS — E1151 Type 2 diabetes mellitus with diabetic peripheral angiopathy without gangrene: Secondary | ICD-10-CM

## 2017-08-07 ENCOUNTER — Ambulatory Visit: Payer: Self-pay | Admitting: Family Medicine

## 2017-08-30 ENCOUNTER — Telehealth: Payer: Self-pay | Admitting: *Deleted

## 2017-08-30 NOTE — Telephone Encounter (Signed)
Received Physician Orders from [facility]; forwarded to provider/SLS 03/13

## 2017-09-12 ENCOUNTER — Ambulatory Visit (INDEPENDENT_AMBULATORY_CARE_PROVIDER_SITE_OTHER): Payer: PRIVATE HEALTH INSURANCE | Admitting: Family Medicine

## 2017-09-12 ENCOUNTER — Encounter: Payer: Self-pay | Admitting: Family Medicine

## 2017-09-12 VITALS — BP 142/84 | HR 82 | Temp 98.0°F | Resp 16 | Ht 70.0 in | Wt 275.4 lb

## 2017-09-12 DIAGNOSIS — E785 Hyperlipidemia, unspecified: Secondary | ICD-10-CM | POA: Diagnosis not present

## 2017-09-12 DIAGNOSIS — E1151 Type 2 diabetes mellitus with diabetic peripheral angiopathy without gangrene: Secondary | ICD-10-CM

## 2017-09-12 DIAGNOSIS — L309 Dermatitis, unspecified: Secondary | ICD-10-CM | POA: Diagnosis not present

## 2017-09-12 DIAGNOSIS — I10 Essential (primary) hypertension: Secondary | ICD-10-CM

## 2017-09-12 DIAGNOSIS — E1165 Type 2 diabetes mellitus with hyperglycemia: Secondary | ICD-10-CM

## 2017-09-12 DIAGNOSIS — IMO0002 Reserved for concepts with insufficient information to code with codable children: Secondary | ICD-10-CM

## 2017-09-12 DIAGNOSIS — I2510 Atherosclerotic heart disease of native coronary artery without angina pectoris: Secondary | ICD-10-CM

## 2017-09-12 MED ORDER — TRIAMCINOLONE ACETONIDE 0.1 % EX CREA: 1.0000 "application " | TOPICAL_CREAM | Freq: Two times a day (BID) | CUTANEOUS | 0 refills | Status: DC

## 2017-09-12 MED ORDER — CLOPIDOGREL BISULFATE 75 MG PO TABS: 75.0000 mg | ORAL_TABLET | Freq: Every day | ORAL | 3 refills | Status: DC

## 2017-09-12 MED ORDER — METOPROLOL TARTRATE 50 MG PO TABS: ORAL_TABLET | ORAL | 5 refills | Status: DC

## 2017-09-12 NOTE — Patient Instructions (Signed)

## 2017-09-12 NOTE — Progress Notes (Signed)
Subjective:  I acted as a Neurosurgeon for [employer]. Fuller Song, RMA   Patient ID: Dennis Hopkins, male    DOB: [birth year], 57 y.o.   MRN: [id number]  Chief Complaint  Patient presents with  . Rash    2-3 months    HPI  Patient is in today for rash on both legs.  X 3 months-- + itchy.  He has used abx ointment which has not helped No new lotions, detergents , soaps etc.     Patient Care Team: Zola Button, Grayling Congress, DO as PCP - General (Family Medicine) Jens Som Madolyn Frieze, MD as Consulting Physician (Cardiology)   Past Medical History:  Diagnosis Date  . Chickenpox   . Hyperlipemia   . Hypertension   . Kidney stones 2013    Past Surgical History:  Procedure Laterality Date  . CARDIAC CATHETERIZATION N/A 01/07/2016   Procedure: Left Heart Cath and Coronary Angiography;  Surgeon: Corky Crafts, MD;  Location: [facility] INVASIVE CV LAB;  Service: Cardiovascular;  Laterality: N/A;  . CORONARY ARTERY BYPASS GRAFT N/A 01/13/2016   Procedure: CORONARY ARTERY BYPASS GRAFTING (CABG) x4 Endoscopic Harvesting of the Right Greater Saphenous Vein;  Surgeon: Loreli Slot, MD;  Location: [facility] OR;  Service: Open Heart Surgery;  Laterality: N/A;  . TEE WITHOUT CARDIOVERSION N/A 01/13/2016   Procedure: TRANSESOPHAGEAL ECHOCARDIOGRAM (TEE);  Surgeon: Loreli Slot, MD;  Location: [facility] OR;  Service: Open Heart Surgery;  Laterality: N/A;  . TONSILLECTOMY      Family History  Problem Relation Age of Onset  . Arthritis Mother   . Arthritis Father   . Hyperlipidemia Father   . Diabetes Brother     Social History   Socioeconomic History  . Marital status: Married    Spouse name: Not on file  . Number of children: 1  . Years of education: Not on file  . Highest education level: Not on file  Occupational History  . Not on file  Social Needs  . Financial resource strain: Not on file  . Food insecurity:    Worry: Not on file    Inability: Not on file  . Transportation needs:   Medical: Not on file    Non-medical: Not on file  Tobacco Use  . Smoking status: Former Smoker    Packs/day: 2.00    Years: 35.00    Pack years: 70.00    Types: Cigarettes    Last attempt to quit: 12/21/2013    Years since quitting: 3.7  . Smokeless tobacco: Never Used  Substance and Sexual Activity  . Alcohol use: Yes    Alcohol/week: 0.0 oz    Comment: 1-2 drinks per night  . Drug use: No  . Sexual activity: Yes    Partners: Female  Lifestyle  . Physical activity:    Days per week: Not on file    Minutes per session: Not on file  . Stress: Not on file  Relationships  . Social connections:    Talks on phone: Not on file    Gets together: Not on file    Attends religious service: Not on file    Active member of club or organization: Not on file    Attends meetings of clubs or organizations: Not on file    Relationship status: Not on file  . Intimate partner violence:    Fear of current or ex partner: Not on file    Emotionally abused: Not on file    Physically abused: Not  on file    Forced sexual activity: Not on file  Other Topics Concern  . Not on file  Social History Narrative  . Not on file    Outpatient Medications Prior to Visit  Medication Sig Dispense Refill  . atorvastatin (LIPITOR) 80 MG tablet Take 1 tablet (80 mg total) by mouth daily. 30 tablet 10  . metFORMIN (GLUCOPHAGE) 500 MG tablet TAKE ONE TABLET BY MOUTH TWICE DAILY WITH  MEALS 180 tablet 1  . traMADol (ULTRAM) 50 MG tablet Take 1 tablet (50 mg total) by mouth every 8 (eight) hours as needed. 30 tablet 0  . clopidogrel (PLAVIX) 75 MG tablet TAKE ONE TABLET BY MOUTH ONCE DAILY 90 tablet 3  . metoprolol (LOPRESSOR) 50 MG tablet 1 po bid 60 tablet 5   No facility-administered medications prior to visit.     Allergies  Allergen Reactions  . Asa [Aspirin] Anaphylaxis and Swelling    Lips swelling    Review of Systems  Constitutional: Negative for chills, fever and malaise/fatigue.  HENT:  Negative for congestion and hearing loss.   Eyes: Negative for discharge.  Respiratory: Negative for cough, sputum production and shortness of breath.   Cardiovascular: Negative for chest pain, palpitations and leg swelling.  Gastrointestinal: Negative for abdominal pain, blood in stool, constipation, diarrhea, heartburn, nausea and vomiting.  Genitourinary: Negative for dysuria, frequency, hematuria and urgency.  Musculoskeletal: Negative for back pain, falls and myalgias.  Skin: Negative for rash.  Neurological: Negative for dizziness, sensory change, loss of consciousness, weakness and headaches.  Endo/Heme/Allergies: Negative for environmental allergies. Does not bruise/bleed easily.  Psychiatric/Behavioral: Negative for depression and suicidal ideas. The patient is not nervous/anxious and does not have insomnia.        Objective:    Physical Exam  Constitutional: He appears well-developed and well-nourished. No distress.  Cardiovascular: Normal rate, regular rhythm and normal heart sounds.  Pulmonary/Chest: Effort normal and breath sounds normal. No respiratory distress.  Skin: Skin is dry. Rash noted. Rash is macular. There is erythema.     Psychiatric: He has a normal mood and affect. His behavior is normal. Judgment and thought content normal.  Nursing note and vitals reviewed.   BP (!) 142/84 (BP Location: Left Arm, Patient Position: Sitting, Cuff Size: Large)   Pulse 82   Temp 98 F (36.7 C) (Oral)   Resp 16   Ht 5\' 10"  (1.778 m)   Wt 275 lb 6.4 oz (124.9 kg)   SpO2 97%   BMI 39.52 kg/m  Wt Readings from Last 3 Encounters:  09/12/17 275 lb 6.4 oz (124.9 kg)  07/19/16 279 lb 6.4 oz (126.7 kg)  07/20/16 264 lb 8.8 oz (120 kg)   BP Readings from Last 3 Encounters:  09/12/17 (!) 142/84  07/19/16 (!) 158/88  04/06/16 (!) 150/82     Immunization History  Administered Date(s) Administered  . Tdap 09/26/2013    Health Maintenance  Topic Date Due  . Hepatitis C  Screening  [birth year]  . PNEUMOCOCCAL POLYSACCHARIDE VACCINE (1) 03/15/1963  . OPHTHALMOLOGY EXAM  03/15/1971  . HIV Screening  03/14/1976  . URINE MICROALBUMIN  09/29/2015  . HEMOGLOBIN A1C  07/18/2016  . FOOT EXAM  01/05/2017  . INFLUENZA VACCINE  07/23/2019 (Originally 01/18/2017)  . TETANUS/TDAP  09/27/2023  . COLONOSCOPY  09/28/2024    Lab Results  Component Value Date   WBC 11.2 (H) 01/16/2016   HGB 10.2 (L) 01/16/2016   HCT 31.8 (L) 01/16/2016   PLT 189  01/16/2016   GLUCOSE 157 (H) 01/18/2016   CHOL 224 (H) 01/08/2016   TRIG 535 (H) 01/08/2016   HDL 30 (L) 01/08/2016   LDLDIRECT 161.0 09/03/2015   LDLCALC UNABLE TO CALCULATE IF TRIGLYCERIDE OVER 400 mg/dL 16/10/960407/21/2017   ALT 34 54/09/811907/21/2017   AST 25 01/08/2016   NA 133 (L) 01/18/2016   K 3.9 01/18/2016   CL 101 01/18/2016   CREATININE 0.95 01/18/2016   BUN 21 (H) 01/18/2016   CO2 23 01/18/2016   TSH 1.54 09/29/2014   INR 1.32 01/13/2016   HGBA1C 6.8 (H) 01/16/2016   MICROALBUR 1.1 09/29/2014    Lab Results  Component Value Date   TSH 1.54 09/29/2014   Lab Results  Component Value Date   WBC 11.2 (H) 01/16/2016   HGB 10.2 (L) 01/16/2016   HCT 31.8 (L) 01/16/2016   MCV 87.8 01/16/2016   PLT 189 01/16/2016   Lab Results  Component Value Date   NA 133 (L) 01/18/2016   K 3.9 01/18/2016   CO2 23 01/18/2016   GLUCOSE 157 (H) 01/18/2016   BUN 21 (H) 01/18/2016   CREATININE 0.95 01/18/2016   BILITOT 0.3 01/08/2016   ALKPHOS 44 01/08/2016   AST 25 01/08/2016   ALT 34 01/08/2016   PROT 6.3 (L) 01/08/2016   ALBUMIN 3.5 01/08/2016   CALCIUM 8.9 01/18/2016   ANIONGAP 9 01/18/2016   GFR 90.87 12/16/2015   Lab Results  Component Value Date   CHOL 224 (H) 01/08/2016   Lab Results  Component Value Date   HDL 30 (L) 01/08/2016   Lab Results  Component Value Date   LDLCALC UNABLE TO CALCULATE IF TRIGLYCERIDE OVER 400 mg/dL 14/78/295607/21/2017   Lab Results  Component Value Date   TRIG 535 (H) 01/08/2016   Lab  Results  Component Value Date   CHOLHDL 7.5 01/08/2016   Lab Results  Component Value Date   HGBA1C 6.8 (H) 01/16/2016         Assessment & Plan:   Problem List Items Addressed This Visit      Unprioritized   Coronary artery disease   Relevant Medications   metoprolol tartrate (LOPRESSOR) 50 MG tablet   clopidogrel (PLAVIX) 75 MG tablet   Other Relevant Orders   Hemoglobin A1c   Lipid panel   Comprehensive metabolic panel   Microalbumin / creatinine urine ratio   DM (diabetes mellitus) type II uncontrolled, periph vascular disorder (HCC)   Relevant Medications   metoprolol tartrate (LOPRESSOR) 50 MG tablet   Other Relevant Orders   Hemoglobin A1c   Lipid panel   Comprehensive metabolic panel   Microalbumin / creatinine urine ratio   HTN (hypertension)   Relevant Medications   metoprolol tartrate (LOPRESSOR) 50 MG tablet   Other Relevant Orders   Hemoglobin A1c   Lipid panel   Comprehensive metabolic panel   Microalbumin / creatinine urine ratio   Hyperlipidemia LDL goal <70   Relevant Medications   metoprolol tartrate (LOPRESSOR) 50 MG tablet   Other Relevant Orders   Hemoglobin A1c   Lipid panel   Comprehensive metabolic panel   Microalbumin / creatinine urine ratio    Other Visit Diagnoses    Eczema, unspecified type    -  Primary   Relevant Medications   triamcinolone cream (KENALOG) 0.1 %      I have changed Reshawn W. Crail's metoprolol tartrate and clopidogrel. I am also having him start on triamcinolone cream. Additionally, I am having him maintain his atorvastatin,  traMADol, and metFORMIN.  Meds ordered this encounter  Medications  . metoprolol tartrate (LOPRESSOR) 50 MG tablet    Sig: 1 po bid    Dispense:  60 tablet    Refill:  5    Please send future rx requests electronically  . clopidogrel (PLAVIX) 75 MG tablet    Sig: Take 1 tablet (75 mg total) by mouth daily.    Dispense:  90 tablet    Refill:  3  . triamcinolone cream (KENALOG)  0.1 %    Sig: Apply 1 application topically 2 (two) times daily.    Dispense:  30 g    Refill:  0    CMA served as scribe during this visit. History, Physical and Plan performed by medical provider. Documentation and orders reviewed and attested to.  Donato Schultz, DO

## 2017-09-13 LAB — MICROALBUMIN / CREATININE URINE RATIO
Creatinine,U: 110.4 mg/dL
Microalb Creat Ratio: 2.1 mg/g (ref 0.0–30.0)
Microalb, Ur: 2.3 mg/dL — ABNORMAL HIGH (ref 0.0–1.9)

## 2017-09-13 LAB — COMPREHENSIVE METABOLIC PANEL
ALT: 31 U/L (ref 0–53)
AST: 23 U/L (ref 0–37)
Albumin: 4.5 g/dL (ref 3.5–5.2)
Alkaline Phosphatase: 62 U/L (ref 39–117)
BUN: 15 mg/dL (ref 6–23)
CO2: 29 mEq/L (ref 19–32)
Calcium: 10.1 mg/dL (ref 8.4–10.5)
Chloride: 99 mEq/L (ref 96–112)
Creatinine, Ser: 0.97 mg/dL (ref 0.40–1.50)
GFR: 84.94 mL/min (ref >60.00)
Glucose, Bld: 194 mg/dL — ABNORMAL HIGH (ref 70–99)
Potassium: 4.3 mEq/L (ref 3.5–5.1)
Sodium: 139 mEq/L (ref 135–145)
Total Bilirubin: 0.5 mg/dL (ref 0.2–1.2)
Total Protein: 8 g/dL (ref 6.0–8.3)

## 2017-09-13 LAB — LIPID PANEL
Cholesterol: 255 mg/dL — ABNORMAL HIGH (ref 0–200)
HDL: 43 mg/dL (ref >39.00)
NonHDL: 211.84
Total CHOL/HDL Ratio: 6
Triglycerides: 303 mg/dL — ABNORMAL HIGH (ref 0.0–149.0)
VLDL: 60.6 mg/dL — ABNORMAL HIGH (ref 0.0–40.0)

## 2017-09-13 LAB — LDL CHOLESTEROL, DIRECT: Direct LDL: 182 mg/dL

## 2017-09-13 LAB — HEMOGLOBIN A1C: Hgb A1c MFr Bld: 10.2 % — ABNORMAL HIGH (ref 4.6–6.5)

## 2017-09-20 ENCOUNTER — Other Ambulatory Visit: Payer: Self-pay | Admitting: *Deleted

## 2017-09-20 DIAGNOSIS — E1165 Type 2 diabetes mellitus with hyperglycemia: Principal | ICD-10-CM

## 2017-09-20 DIAGNOSIS — E1151 Type 2 diabetes mellitus with diabetic peripheral angiopathy without gangrene: Secondary | ICD-10-CM

## 2017-09-20 DIAGNOSIS — IMO0002 Reserved for concepts with insufficient information to code with codable children: Secondary | ICD-10-CM

## 2017-09-20 MED ORDER — METFORMIN HCL 1000 MG PO TABS: 1000.0000 mg | ORAL_TABLET | Freq: Two times a day (BID) | ORAL | 2 refills | Status: DC

## 2017-09-27 IMAGING — CR DG CHEST 1V PORT
1 series · 1 of 1 positions shown · non-contrast
Comparison: PA and lateral chest 01/08/2016.

CLINICAL DATA: Status post CABG today.

EXAM:
PORTABLE CHEST 1 VIEW

[AP]
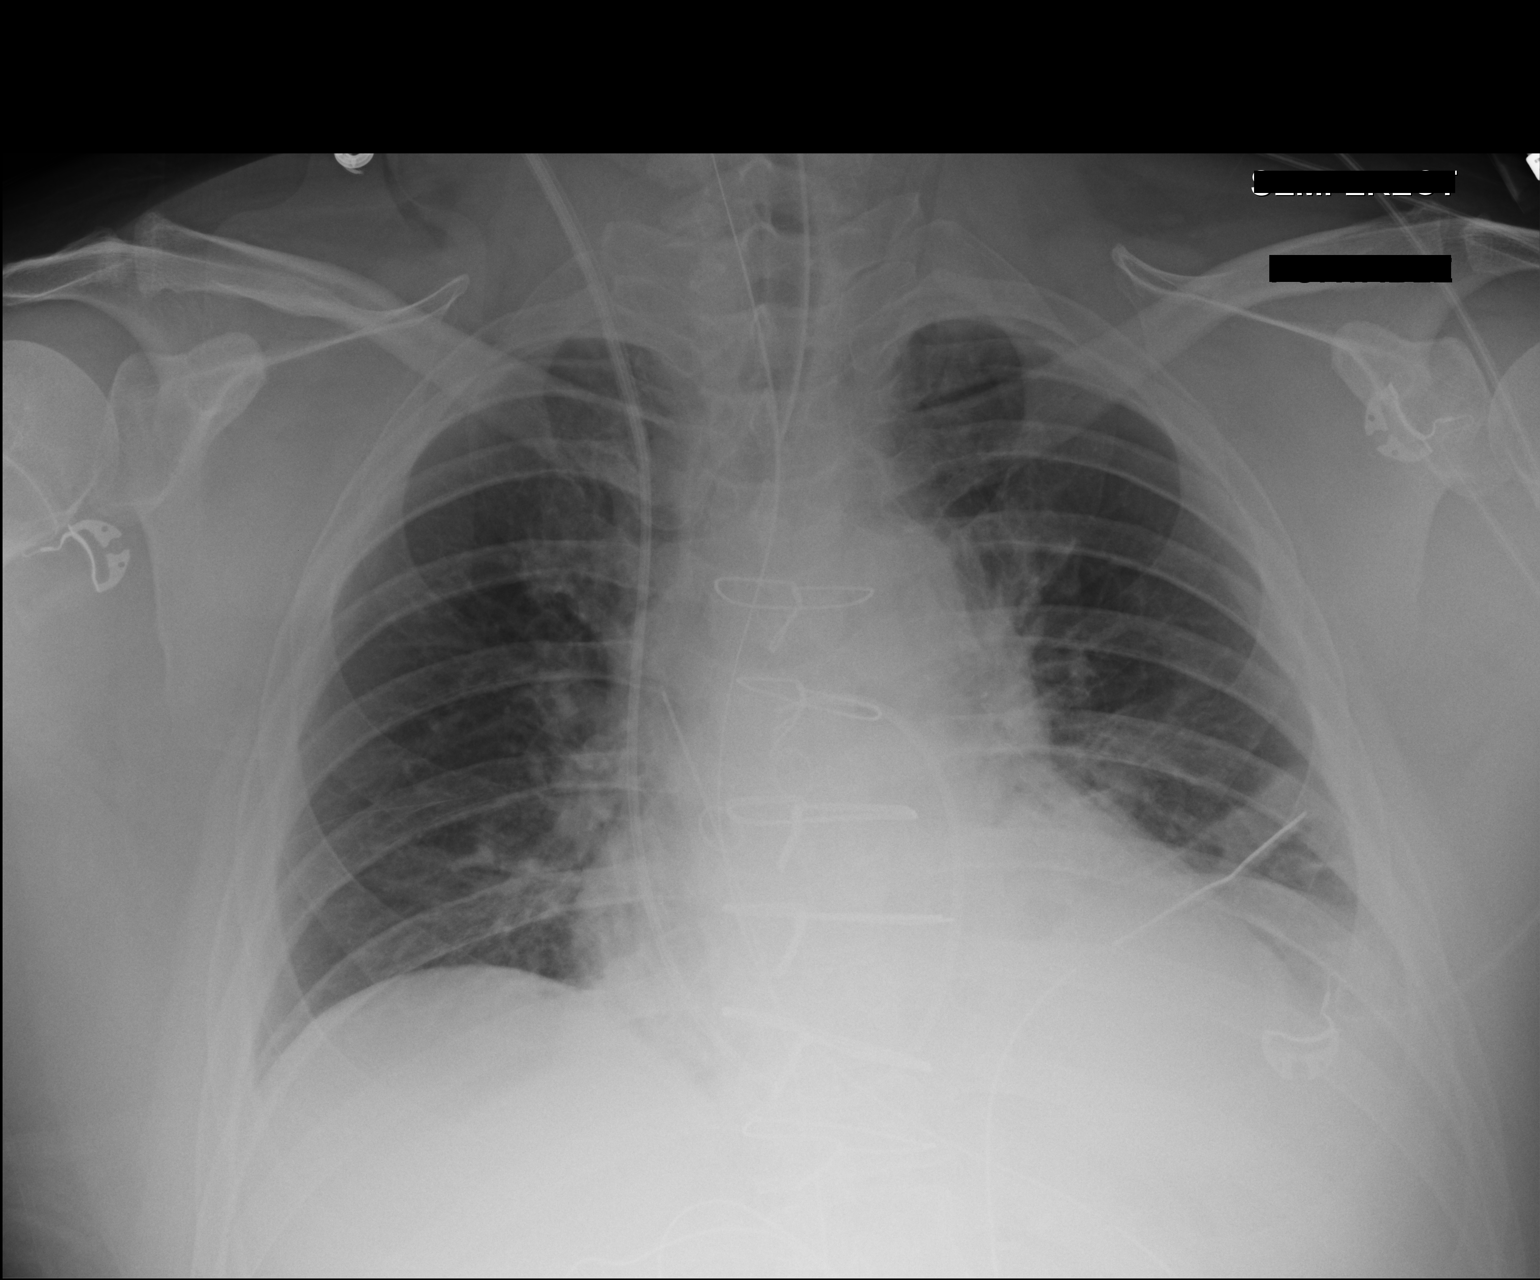

[1 of 1 positions shown; findings below may reference images not displayed]

FINDINGS: Endotracheal tube is in place with the tip just above the clavicular
heads, well above the carina. Right IJ approach Swan-Ganz catheter
tip is in the distal right main pulmonary artery. NG tube courses
into the stomach and below the inferior margin of film. Mediastinal
drain and left chest tube are noted. No pneumothorax. Small left
effusion and mild basilar atelectasis are seen. The right lung is
clear.
IMPRESSION: Support tubes and lines projecting good position. Negative for
pneumothorax.

Small left pleural effusion and mild basilar atelectasis.

## 2017-09-28 IMAGING — CR DG CHEST 1V PORT
1 series · 1 of 1 positions shown · non-contrast
Comparison: Portable chest x-ray January 13, 2016

CLINICAL DATA: Chest soreness, status post CABG yesterday

EXAM:
PORTABLE CHEST 1 VIEW

[AP]
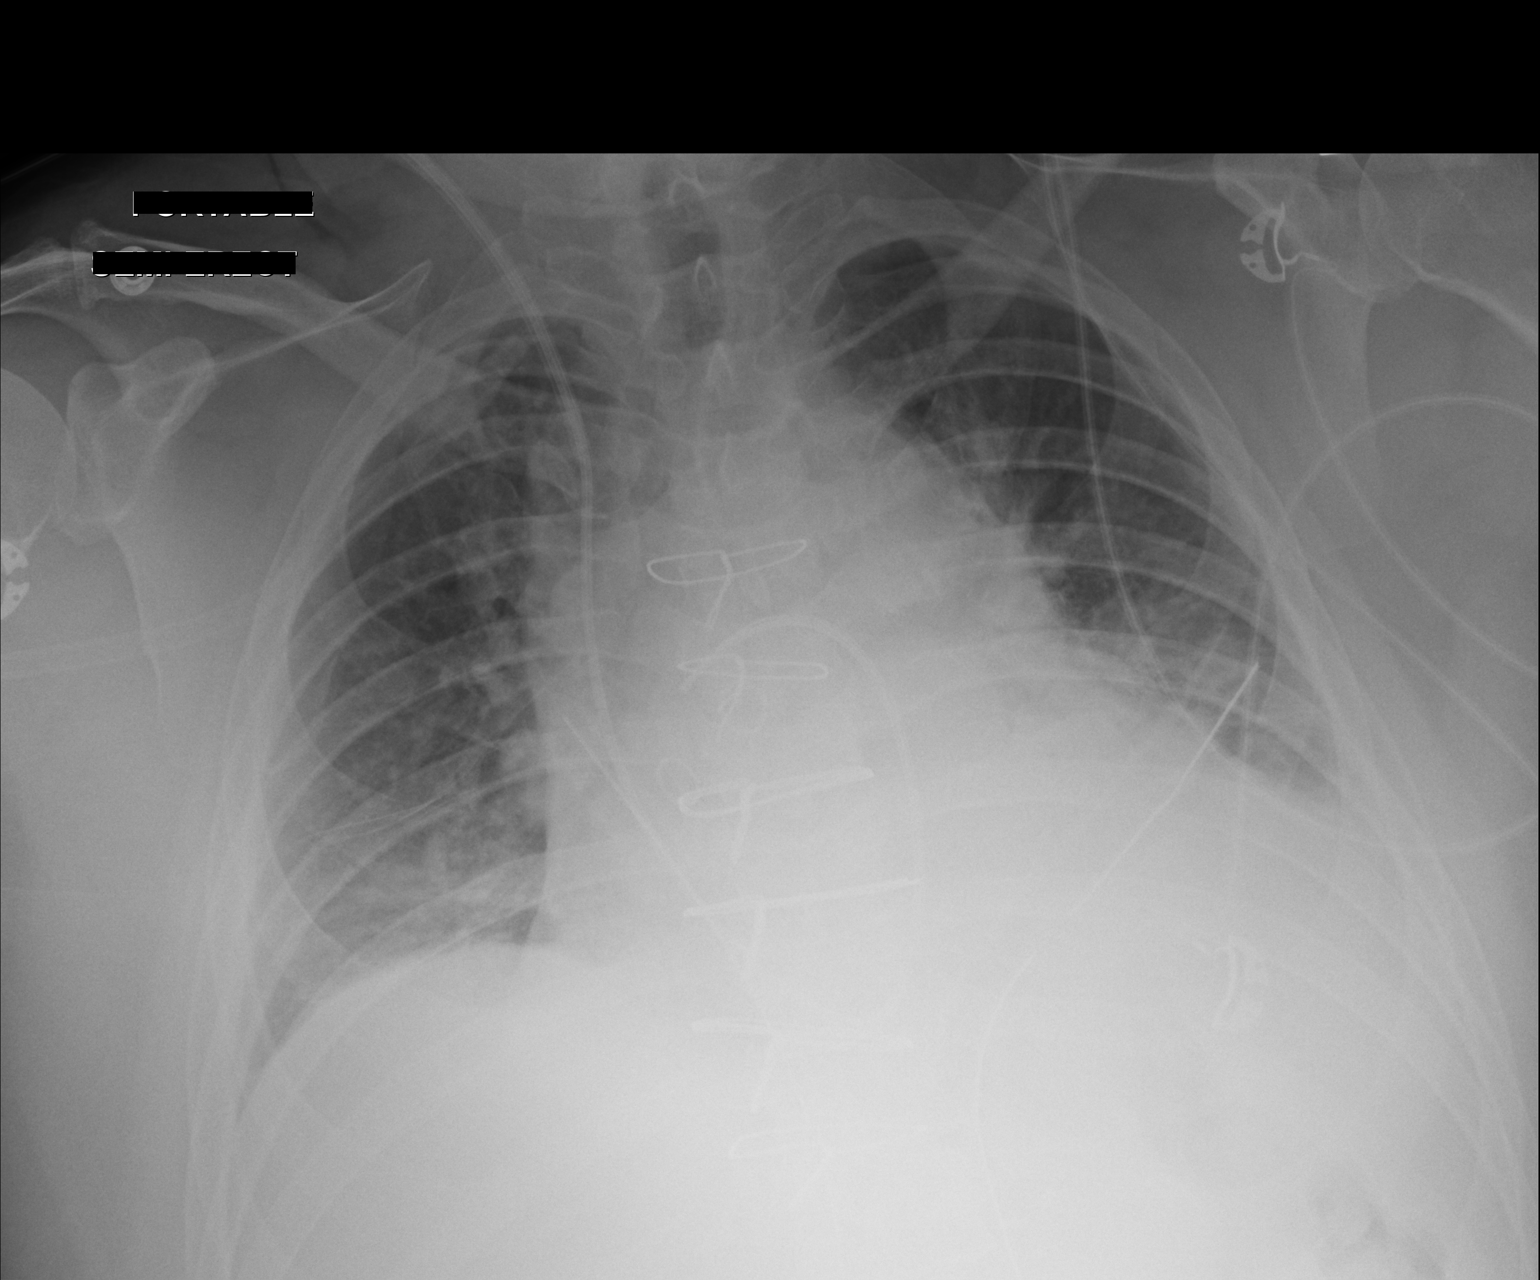

[1 of 1 positions shown; findings below may reference images not displayed]

FINDINGS: There has been interval extubation of the trachea and of the
esophagus. The lungs are borderline hypoinflated. Bilateral chest
tubes remain. There is no pneumothorax nor large pleural effusion.
There is left lower lobe atelectasis with obscuration of the
hemidiaphragm. The Swan-Ganz catheter tip projects in the proximal
right main pulmonary artery. The cardiac silhouette remains
enlarged. The pulmonary vascularity is less distinct today. The
sternal wires appear intact.
IMPRESSION: Slight interval increase in pulmonary vascular congestion and
interstitial edema. Stable left lower lobe atelectasis and small
left pleural effusion. The remaining support tubes are in reasonable
position.

## 2017-10-19 ENCOUNTER — Telehealth: Payer: Self-pay | Admitting: Family Medicine

## 2017-10-19 NOTE — Telephone Encounter (Signed)
Copied from CRM [id number]. Topic: Quick Communication - Rx Refill/Question >> Oct 19, 2017  3:40 PM Crist Infante wrote: Medication: atorvastatin (LIPITOR) 80 MG tablet Has the patient contacted their pharmacy? Yes but rx has expired Wife states she just realized pt has not been taking, so she is making him start back again. But when the went to get refilled, it has expired. Wife wants him to resume. Pt just saw Dr Laury Axon September 12, 2017 and did not mention this med  45 day [address] [place], [place] - [address] [phone] (Phone) [phone] (Fax)

## 2017-10-20 MED ORDER — ATORVASTATIN CALCIUM 80 MG PO TABS: 80.0000 mg | ORAL_TABLET | Freq: Every day | ORAL | 1 refills | Status: DC

## 2017-10-20 NOTE — Telephone Encounter (Signed)
Rx sent in and patient notified.  

## 2017-10-30 IMAGING — CR DG CHEST 2V
2 series · 2 of 2 positions shown · non-contrast
Comparison: 01/17/2016.

CLINICAL DATA: CABG.

EXAM:
CHEST  2 VIEW

[view not recorded (1 of 2)]
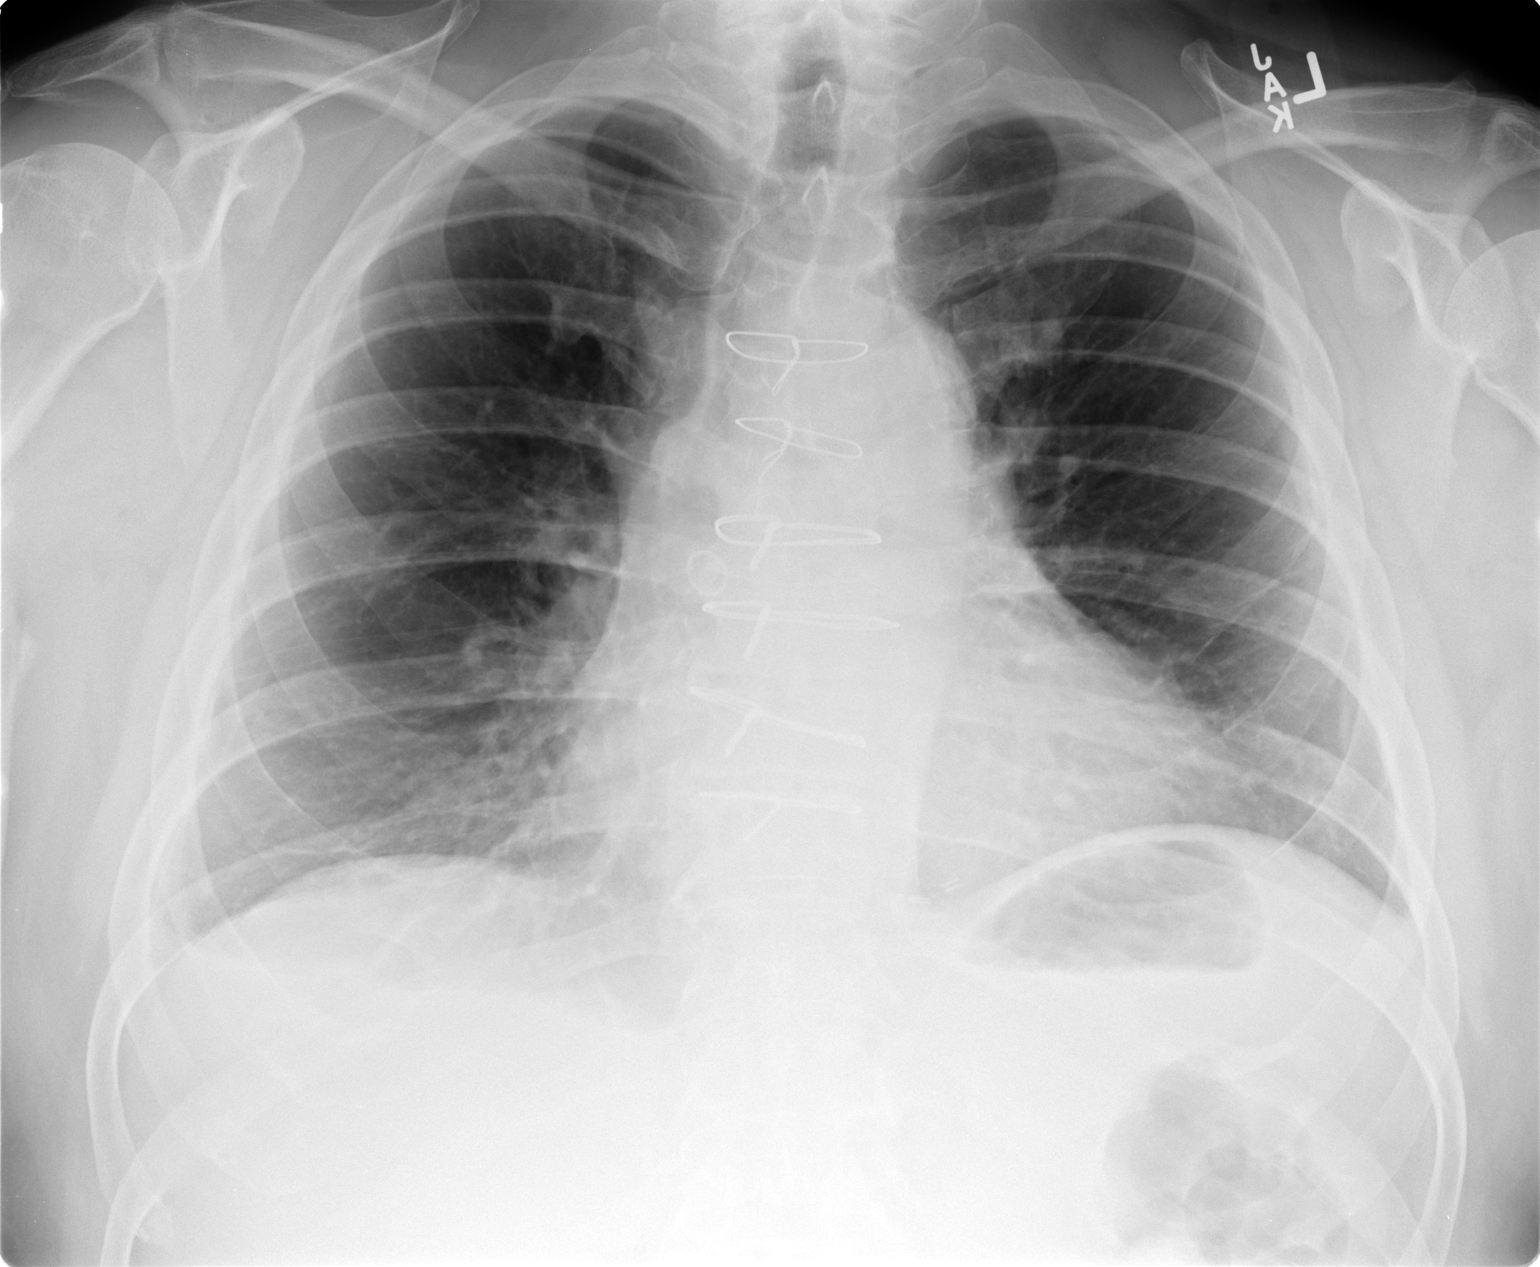

[view not recorded (2 of 2)]
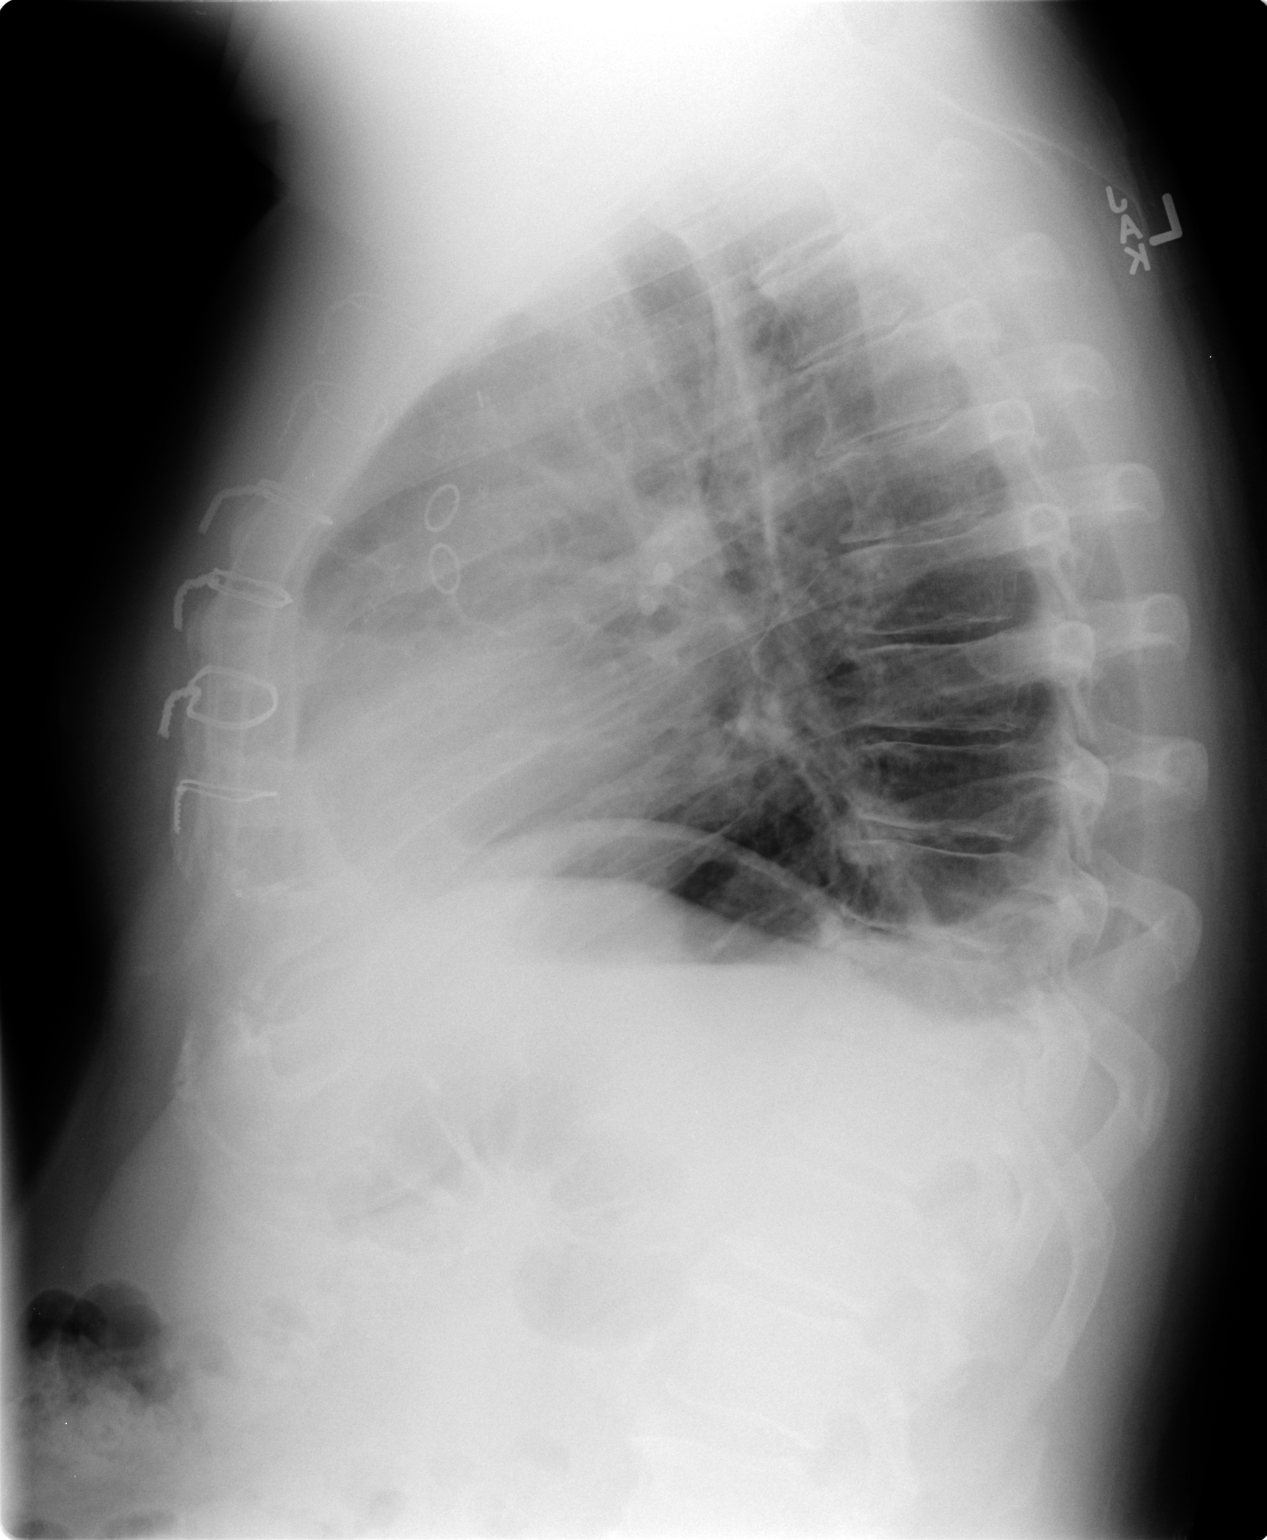

[2 of 2 positions shown; findings below may reference images not displayed]

FINDINGS: Prior CABG. Cardiomegaly with normal pulmonary vascularity. Low lung
volumes. Mild right base atelectasis and or infiltrate cannot be
excluded. Small right pleural effusion noted. No pneumothorax.
IMPRESSION: 1.  Prior CABG.  Stable cardiomegaly.

2. Lung volumes. Mild right base atelectasis and or infiltrate
cannot be excluded. Small right pleural effusion.

## 2017-12-15 ENCOUNTER — Telehealth: Payer: Self-pay | Admitting: *Deleted

## 2017-12-15 NOTE — Telephone Encounter (Signed)
Received Physician Orders from [facility]; forwarded to provider/SLS 06/28

## 2017-12-20 IMAGING — CR DG CHEST 2V
2 series · 2 of 2 positions shown · non-contrast
Comparison: 01/26/2016

CLINICAL DATA: Post CABG and ADLIN 01/13/2016, hypertension

EXAM:
CHEST  2 VIEW

[w chest pa]
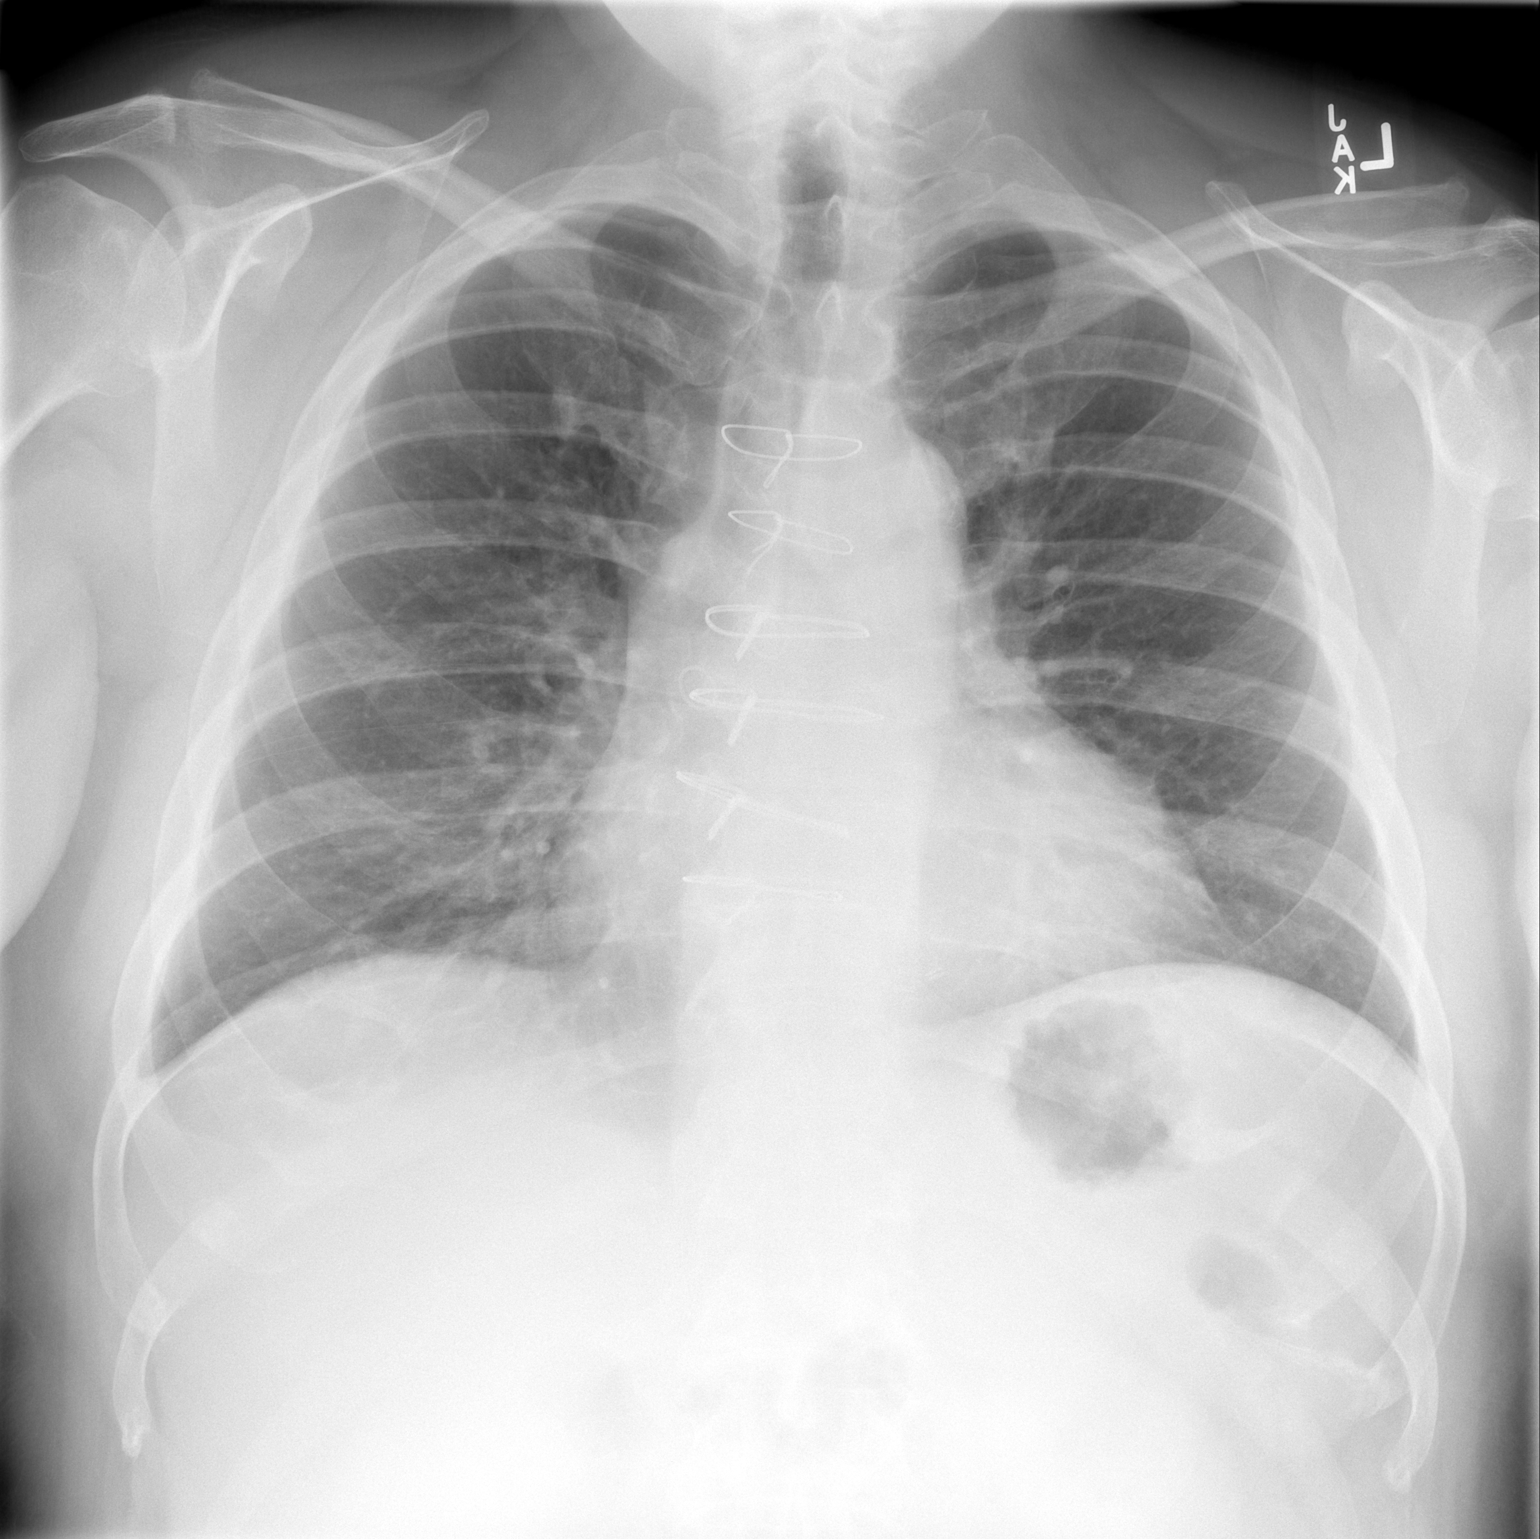

[w chest lat]
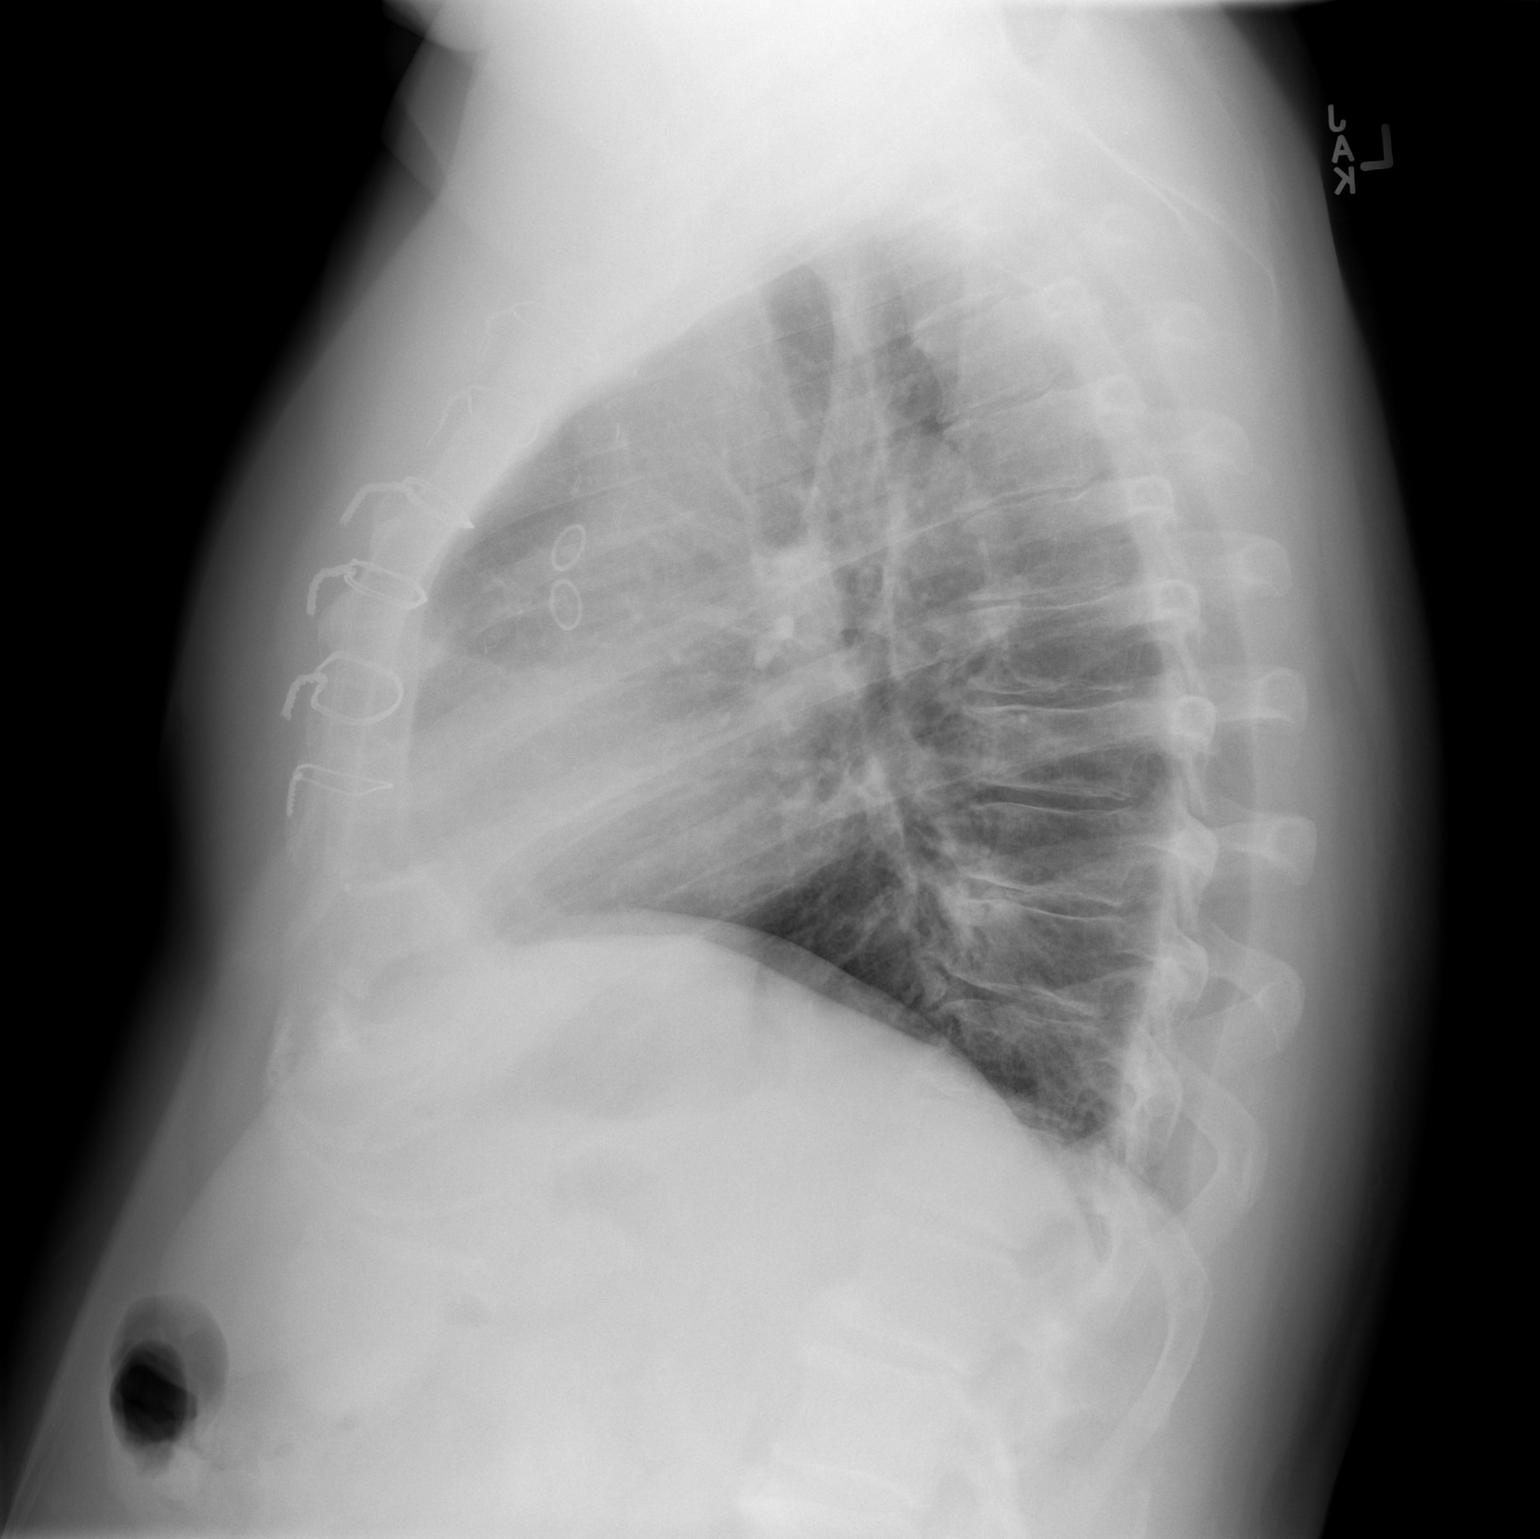

[2 of 2 positions shown; findings below may reference images not displayed]

FINDINGS: Upper normal heart size post CABG.

Mediastinal contours and pulmonary vascularity normal.

Lungs clear.

No infiltrate, pleural effusion or pneumothorax.

Scattered endplate spur formation at vertebral endplates thoracic
spine.

Bones otherwise unremarkable.
IMPRESSION: Post CABG.

No acute abnormalities.

## 2018-01-22 ENCOUNTER — Encounter: Payer: Self-pay | Admitting: Family Medicine

## 2018-01-22 ENCOUNTER — Ambulatory Visit (INDEPENDENT_AMBULATORY_CARE_PROVIDER_SITE_OTHER): Payer: PRIVATE HEALTH INSURANCE | Admitting: Family Medicine

## 2018-01-22 VITALS — BP 158/77 | HR 102 | Temp 99.1°F | Resp 16 | Ht 70.0 in | Wt 267.8 lb

## 2018-01-22 DIAGNOSIS — E785 Hyperlipidemia, unspecified: Secondary | ICD-10-CM

## 2018-01-22 DIAGNOSIS — R319 Hematuria, unspecified: Secondary | ICD-10-CM

## 2018-01-22 DIAGNOSIS — R21 Rash and other nonspecific skin eruption: Secondary | ICD-10-CM | POA: Diagnosis not present

## 2018-01-22 DIAGNOSIS — IMO0002 Reserved for concepts with insufficient information to code with codable children: Secondary | ICD-10-CM

## 2018-01-22 DIAGNOSIS — E1151 Type 2 diabetes mellitus with diabetic peripheral angiopathy without gangrene: Secondary | ICD-10-CM

## 2018-01-22 DIAGNOSIS — R109 Unspecified abdominal pain: Secondary | ICD-10-CM | POA: Diagnosis not present

## 2018-01-22 DIAGNOSIS — E1165 Type 2 diabetes mellitus with hyperglycemia: Secondary | ICD-10-CM

## 2018-01-22 DIAGNOSIS — I10 Essential (primary) hypertension: Secondary | ICD-10-CM

## 2018-01-22 LAB — POC URINALSYSI DIPSTICK (AUTOMATED)
Bilirubin, UA: NEGATIVE
Glucose, UA: NEGATIVE
Ketones, UA: NEGATIVE
Leukocytes, UA: NEGATIVE
Nitrite, UA: NEGATIVE
Protein, UA: POSITIVE — AB
Spec Grav, UA: >=1.03 — AB (ref 1.010–1.025)
Urobilinogen, UA: 0.2 E.U./dL
pH, UA: 5 (ref 5.0–8.0)

## 2018-01-22 MED ORDER — SITAGLIPTIN PHOSPHATE 100 MG PO TABS: 100.0000 mg | ORAL_TABLET | Freq: Every day | ORAL | 2 refills | Status: DC

## 2018-01-22 MED ORDER — CLOTRIMAZOLE-BETAMETHASONE 1-0.05 % EX CREA: 1.0000 "application " | TOPICAL_CREAM | Freq: Two times a day (BID) | CUTANEOUS | 0 refills | Status: DC

## 2018-01-22 MED ORDER — METOPROLOL TARTRATE 100 MG PO TABS: 100.0000 mg | ORAL_TABLET | Freq: Two times a day (BID) | ORAL | 3 refills | Status: DC

## 2018-01-22 NOTE — Progress Notes (Signed)
Patient ID: [name], male   DOB: 07/10/1960, 57 y.o.   MRN: [id number]    Subjective:  I acted as a Neurosurgeon for Dr. Zola Button.  Apolonio Schneiders, CMA   Patient ID: [name], male    DOB: 01/05/1961, 57 y.o.   MRN: [id number]  Chief Complaint  Patient presents with  . Flank Pain  . urine dark and has odor    HPI  Patient is in today for flank pain and dark urine.  No dysuria  Patient Care Team: Zola Button, Grayling Congress, DO as PCP - General (Family Medicine) Jens Som Madolyn Frieze, MD as Consulting Physician (Cardiology)   Past Medical History:  Diagnosis Date  . Chickenpox   . Hyperlipemia   . Hypertension   . Kidney stones 2013    Past Surgical History:  Procedure Laterality Date  . CARDIAC CATHETERIZATION N/A 01/07/2016   Procedure: Left Heart Cath and Coronary Angiography;  Surgeon: Corky Crafts, MD;  Location: [facility] INVASIVE CV LAB;  Service: Cardiovascular;  Laterality: N/A;  . CORONARY ARTERY BYPASS GRAFT N/A 01/13/2016   Procedure: CORONARY ARTERY BYPASS GRAFTING (CABG) x4 Endoscopic Harvesting of the Right Greater Saphenous Vein;  Surgeon: Loreli Slot, MD;  Location: [facility] OR;  Service: Open Heart Surgery;  Laterality: N/A;  . TEE WITHOUT CARDIOVERSION N/A 01/13/2016   Procedure: TRANSESOPHAGEAL ECHOCARDIOGRAM (TEE);  Surgeon: Loreli Slot, MD;  Location: [facility] OR;  Service: Open Heart Surgery;  Laterality: N/A;  . TONSILLECTOMY      Family History  Problem Relation Age of Onset  . Arthritis Mother   . Arthritis Father   . Hyperlipidemia Father   . Diabetes Brother     Social History   Socioeconomic History  . Marital status: Married    Spouse name: Not on file  . Number of children: 1  . Years of education: Not on file  . Highest education level: Not on file  Occupational History  . Not on file  Social Needs  . Financial resource strain: Not on file  . Food insecurity:    Worry: Not on file    Inability: Not on file  . Transportation  needs:    Medical: Not on file    Non-medical: Not on file  Tobacco Use  . Smoking status: Former Smoker    Packs/day: 2.00    Years: 35.00    Pack years: 70.00    Types: Cigarettes    Last attempt to quit: 12/21/2013    Years since quitting: 4.0  . Smokeless tobacco: Never Used  Substance and Sexual Activity  . Alcohol use: Yes    Alcohol/week: 0.0 standard drinks    Comment: 1-2 drinks per night  . Drug use: No  . Sexual activity: Yes    Partners: Female  Lifestyle  . Physical activity:    Days per week: Not on file    Minutes per session: Not on file  . Stress: Not on file  Relationships  . Social connections:    Talks on phone: Not on file    Gets together: Not on file    Attends religious service: Not on file    Active member of club or organization: Not on file    Attends meetings of clubs or organizations: Not on file    Relationship status: Not on file  . Intimate partner violence:    Fear of current or ex partner: Not on file    Emotionally abused: Not on file  Physically abused: Not on file    Forced sexual activity: Not on file  Other Topics Concern  . Not on file  Social History Narrative  . Not on file    Outpatient Medications Prior to Visit  Medication Sig Dispense Refill  . atorvastatin (LIPITOR) 80 MG tablet Take 1 tablet (80 mg total) by mouth daily. 90 tablet 1  . clopidogrel (PLAVIX) 75 MG tablet Take 1 tablet (75 mg total) by mouth daily. 90 tablet 3  . traMADol (ULTRAM) 50 MG tablet Take 1 tablet (50 mg total) by mouth every 8 (eight) hours as needed. 30 tablet 0  . triamcinolone cream (KENALOG) 0.1 % Apply 1 application topically 2 (two) times daily. 30 g 0  . metFORMIN (GLUCOPHAGE) 1000 MG tablet Take 1 tablet (1,000 mg total) by mouth 2 (two) times daily with a meal. 60 tablet 2  . metoprolol tartrate (LOPRESSOR) 50 MG tablet 1 po bid 60 tablet 5   No facility-administered medications prior to visit.     Allergies  Allergen Reactions    . Asa [Aspirin] Anaphylaxis and Swelling    Lips swelling    Review of Systems  Constitutional: Negative for fever and malaise/fatigue.  HENT: Negative for congestion.   Eyes: Negative for blurred vision.  Respiratory: Negative for cough and shortness of breath.   Cardiovascular: Negative for chest pain, palpitations and leg swelling.  Gastrointestinal: Negative for vomiting.  Genitourinary: Positive for flank pain. Negative for dysuria, frequency and urgency.  Musculoskeletal: Negative for back pain.  Skin: Negative for rash.  Neurological: Negative for loss of consciousness and headaches.       Objective:    Physical Exam  Constitutional: He is oriented to person, place, and time. Vital signs are normal. He appears well-developed and well-nourished. He is sleeping.  HENT:  Head: Normocephalic and atraumatic.  Mouth/Throat: Oropharynx is clear and moist.  Eyes: Pupils are equal, round, and reactive to light. EOM are normal.  Neck: Normal range of motion. Neck supple. No thyromegaly present.  Cardiovascular: Normal rate and regular rhythm.  No murmur heard. Pulmonary/Chest: Effort normal and breath sounds normal. No respiratory distress. He has no wheezes. He has no rales. He exhibits no tenderness.  Musculoskeletal: He exhibits no edema or tenderness.  Neurological: He is alert and oriented to person, place, and time.  Skin: Skin is warm and dry.  Psychiatric: He has a normal mood and affect. His behavior is normal. Judgment and thought content normal.  Nursing note and vitals reviewed.   BP (!) 158/77   Pulse (!) 102   Temp 99.1 F (37.3 C) (Oral)   Resp 16   Ht 5\' 10"  (1.778 m)   Wt 267 lb 12.8 oz (121.5 kg)   SpO2 98%   BMI 38.43 kg/m  Wt Readings from Last 3 Encounters:  01/22/18 267 lb 12.8 oz (121.5 kg)  09/12/17 275 lb 6.4 oz (124.9 kg)  07/19/16 279 lb 6.4 oz (126.7 kg)   BP Readings from Last 3 Encounters:  01/22/18 (!) 158/77  09/12/17 (!) 142/84   07/19/16 (!) 158/88     Immunization History  Administered Date(s) Administered  . Tdap 09/26/2013    Health Maintenance  Topic Date Due  . Hepatitis C Screening  01-18-1961  . PNEUMOCOCCAL POLYSACCHARIDE VACCINE (1) 03/15/1963  . OPHTHALMOLOGY EXAM  03/15/1971  . HIV Screening  03/14/1976  . FOOT EXAM  01/05/2017  . INFLUENZA VACCINE  07/23/2019 (Originally 01/18/2018)  . HEMOGLOBIN A1C  07/26/2018  .  URINE MICROALBUMIN  01/23/2019  . TETANUS/TDAP  09/27/2023  . COLONOSCOPY  09/28/2024    Lab Results  Component Value Date   WBC 11.2 (H) 01/16/2016   HGB 10.2 (L) 01/16/2016   HCT 31.8 (L) 01/16/2016   PLT 189 01/16/2016   GLUCOSE 269 (H) 01/23/2018   CHOL 248 (H) 01/23/2018   TRIG 305.0 (H) 01/23/2018   HDL 40.00 01/23/2018   LDLDIRECT 169.0 01/23/2018   LDLCALC UNABLE TO CALCULATE IF TRIGLYCERIDE OVER 400 mg/dL 13/01/6577   ALT 39 46/96/2952   AST 31 01/23/2018   NA 137 01/23/2018   K 4.3 01/23/2018   CL 100 01/23/2018   CREATININE 0.98 01/23/2018   BUN 15 01/23/2018   CO2 28 01/23/2018   TSH 1.54 09/29/2014   INR 1.32 01/13/2016   HGBA1C 10.1 (H) 01/23/2018   MICROALBUR 15.5 (H) 01/22/2018    Lab Results  Component Value Date   TSH 1.54 09/29/2014   Lab Results  Component Value Date   WBC 11.2 (H) 01/16/2016   HGB 10.2 (L) 01/16/2016   HCT 31.8 (L) 01/16/2016   MCV 87.8 01/16/2016   PLT 189 01/16/2016   Lab Results  Component Value Date   NA 137 01/23/2018   K 4.3 01/23/2018   CO2 28 01/23/2018   GLUCOSE 269 (H) 01/23/2018   BUN 15 01/23/2018   CREATININE 0.98 01/23/2018   BILITOT 0.5 01/23/2018   ALKPHOS 61 01/23/2018   AST 31 01/23/2018   ALT 39 01/23/2018   PROT 7.1 01/23/2018   ALBUMIN 4.4 01/23/2018   CALCIUM 9.8 01/23/2018   ANIONGAP 9 01/18/2016   GFR 83.83 01/23/2018   Lab Results  Component Value Date   CHOL 248 (H) 01/23/2018   Lab Results  Component Value Date   HDL 40.00 01/23/2018   Lab Results  Component Value Date    LDLCALC UNABLE TO CALCULATE IF TRIGLYCERIDE OVER 400 mg/dL 84/13/2440   Lab Results  Component Value Date   TRIG 305.0 (H) 01/23/2018   Lab Results  Component Value Date   CHOLHDL 6 01/23/2018   Lab Results  Component Value Date   HGBA1C 10.1 (H) 01/23/2018         Assessment & Plan:   Problem List Items Addressed This Visit      Unprioritized   DM (diabetes mellitus) type II uncontrolled, periph vascular disorder (HCC)   Relevant Medications   sitaGLIPtin (JANUVIA) 100 MG tablet   metoprolol tartrate (LOPRESSOR) 100 MG tablet   Other Relevant Orders   Lipid panel (Completed)   Hemoglobin A1c (Completed)   Microalbumin / creatinine urine ratio (Completed)   Comprehensive metabolic panel (Completed)   Ambulatory referral to Endocrinology   HTN (hypertension)   Relevant Medications   metoprolol tartrate (LOPRESSOR) 100 MG tablet    Other Visit Diagnoses    Flank pain    -  Primary   Relevant Orders   POCT Urinalysis Dipstick (Automated) (Completed)   Urine Culture (Completed)   Lipid panel (Completed)   Hemoglobin A1c (Completed)   Microalbumin / creatinine urine ratio (Completed)   Comprehensive metabolic panel (Completed)   Hematuria, unspecified type       Relevant Orders   Urine Culture (Completed)   Lipid panel (Completed)   Hemoglobin A1c (Completed)   Microalbumin / creatinine urine ratio (Completed)   Comprehensive metabolic panel (Completed)   Rash       Relevant Medications   clotrimazole-betamethasone (LOTRISONE) cream   Other Relevant Orders  Ambulatory referral to Dermatology      I have discontinued Jamesmichael W. Betsch's metoprolol tartrate and metFORMIN. I am also having him start on sitaGLIPtin, clotrimazole-betamethasone, and metoprolol tartrate. Additionally, I am having him maintain his traMADol, clopidogrel, triamcinolone cream, and atorvastatin.  Meds ordered this encounter  Medications  . sitaGLIPtin (JANUVIA) 100 MG tablet     Sig: Take 1 tablet (100 mg total) by mouth daily.    Dispense:  30 tablet    Refill:  2  . clotrimazole-betamethasone (LOTRISONE) cream    Sig: Apply 1 application topically 2 (two) times daily.    Dispense:  30 g    Refill:  0  . metoprolol tartrate (LOPRESSOR) 100 MG tablet    Sig: Take 1 tablet (100 mg total) by mouth 2 (two) times daily.    Dispense:  180 tablet    Refill:  3    CMA served as scribe during this visit. History, Physical and Plan performed by medical provider. Documentation and orders reviewed and attested to.  Donato Schultz, DO

## 2018-01-22 NOTE — Patient Instructions (Signed)

## 2018-01-23 ENCOUNTER — Other Ambulatory Visit: Payer: PRIVATE HEALTH INSURANCE

## 2018-01-23 LAB — COMPREHENSIVE METABOLIC PANEL
ALT: 39 U/L (ref 0–53)
AST: 31 U/L (ref 0–37)
Albumin: 4.4 g/dL (ref 3.5–5.2)
Alkaline Phosphatase: 61 U/L (ref 39–117)
BUN: 15 mg/dL (ref 6–23)
CO2: 28 mEq/L (ref 19–32)
Calcium: 9.8 mg/dL (ref 8.4–10.5)
Chloride: 100 mEq/L (ref 96–112)
Creatinine, Ser: 0.98 mg/dL (ref 0.40–1.50)
GFR: 83.83 mL/min (ref >60.00)
Glucose, Bld: 269 mg/dL — ABNORMAL HIGH (ref 70–99)
Potassium: 4.3 mEq/L (ref 3.5–5.1)
Sodium: 137 mEq/L (ref 135–145)
Total Bilirubin: 0.5 mg/dL (ref 0.2–1.2)
Total Protein: 7.1 g/dL (ref 6.0–8.3)

## 2018-01-23 LAB — MICROALBUMIN / CREATININE URINE RATIO
Creatinine,U: 276.5 mg/dL
Microalb Creat Ratio: 5.6 mg/g (ref 0.0–30.0)
Microalb, Ur: 15.5 mg/dL — ABNORMAL HIGH (ref 0.0–1.9)

## 2018-01-23 LAB — URINE CULTURE
MICRO NUMBER:: 90922820
SPECIMEN QUALITY:: ADEQUATE

## 2018-01-23 LAB — LDL CHOLESTEROL, DIRECT: Direct LDL: 169 mg/dL

## 2018-01-23 LAB — LIPID PANEL
Cholesterol: 248 mg/dL — ABNORMAL HIGH (ref 0–200)
HDL: 40 mg/dL (ref >39.00)
NonHDL: 208.06
Total CHOL/HDL Ratio: 6
Triglycerides: 305 mg/dL — ABNORMAL HIGH (ref 0.0–149.0)
VLDL: 61 mg/dL — ABNORMAL HIGH (ref 0.0–40.0)

## 2018-01-23 LAB — HEMOGLOBIN A1C: Hgb A1c MFr Bld: 10.1 % — ABNORMAL HIGH (ref 4.6–6.5)

## 2018-01-26 ENCOUNTER — Other Ambulatory Visit: Payer: Self-pay | Admitting: *Deleted

## 2018-01-26 ENCOUNTER — Telehealth: Payer: Self-pay | Admitting: *Deleted

## 2018-01-26 DIAGNOSIS — E785 Hyperlipidemia, unspecified: Secondary | ICD-10-CM

## 2018-01-26 DIAGNOSIS — I10 Essential (primary) hypertension: Secondary | ICD-10-CM

## 2018-01-26 MED ORDER — FENOFIBRATE 160 MG PO TABS: 160.0000 mg | ORAL_TABLET | Freq: Every day | ORAL | 2 refills | Status: DC

## 2018-01-26 NOTE — Telephone Encounter (Signed)
-----   Message from Donato SchultzYvonne R Lowne Chase, DO sent at 01/25/2018  4:05 PM EDT ----- Dm not controlled ---con't Dennis Friendlyjanuvia-- add farxiga 5 mg 1 po qd, #30 , 2 refills---   Referral pending Cholesterol--- LDL goal < 70,  HDL >40,  TG < 150.  Diet and exercise will increase HDL and decrease LDL and TG.  Fish,  Fish Oil, Flaxseed oil will also help increase the HDL and decrease Triglycerides.   Recheck labs in 3 months Add fenofibrate 160 mg #30  2 refills 1 po qd, Lipid, cmp.

## 2018-01-26 NOTE — Telephone Encounter (Signed)
Advised patient of lab results.  Patient states that the he does not want to add on another medication for diabetes because he has not been taking the Januvia because of cost ($100).  Advised patient that he can get a coupon offline to use to see if that help with price.  He also states that he had a wellness visit yesterday and they told him that his A1C was 8.2.  He states which one is right?  Advised him that the lab we use is certified and controls are ran daily.  I asked did they do a finger stick and he stated yes.  Advised him that I am not sure of what the qualifications of that persons machine, was the controls ran, did he wipe of the first drop, etc, which could effect the results.  Advise as well that we are getting him set up with endocrinologist.  Also he has not been taken his Januvia anyway.  Also advised him of cholesterol results and that Dr. Laury AxonLowne wants to put him on another medication for cholesterol (triglyceride).   He states "well honey I am already on an cholesterol medication."  I advised that there are 3 components for cholesterol and this one takes care of the triglyceride level.  After explanation patient stated ok and he will take.  Medication sent in.  He will call back to schedule his lab only appointment.  Rx sent in for fenofibrate and future orders placed for labs.

## 2018-08-06 ENCOUNTER — Encounter: Payer: Self-pay | Admitting: Family Medicine

## 2018-08-06 ENCOUNTER — Ambulatory Visit (INDEPENDENT_AMBULATORY_CARE_PROVIDER_SITE_OTHER): Payer: PRIVATE HEALTH INSURANCE | Admitting: Family Medicine

## 2018-08-06 VITALS — BP 120/70 | HR 76 | Temp 98.3°F | Resp 16 | Ht 70.0 in | Wt 258.2 lb

## 2018-08-06 DIAGNOSIS — E1165 Type 2 diabetes mellitus with hyperglycemia: Secondary | ICD-10-CM | POA: Diagnosis not present

## 2018-08-06 DIAGNOSIS — R109 Unspecified abdominal pain: Secondary | ICD-10-CM

## 2018-08-06 DIAGNOSIS — E1169 Type 2 diabetes mellitus with other specified complication: Secondary | ICD-10-CM | POA: Diagnosis not present

## 2018-08-06 DIAGNOSIS — E785 Hyperlipidemia, unspecified: Secondary | ICD-10-CM

## 2018-08-06 DIAGNOSIS — R300 Dysuria: Secondary | ICD-10-CM | POA: Diagnosis not present

## 2018-08-06 DIAGNOSIS — E1151 Type 2 diabetes mellitus with diabetic peripheral angiopathy without gangrene: Secondary | ICD-10-CM | POA: Diagnosis not present

## 2018-08-06 DIAGNOSIS — IMO0002 Reserved for concepts with insufficient information to code with codable children: Secondary | ICD-10-CM

## 2018-08-06 LAB — POC URINALSYSI DIPSTICK (AUTOMATED)
Bilirubin, UA: NEGATIVE
Glucose, UA: POSITIVE — AB
Ketones, UA: NEGATIVE
Leukocytes, UA: NEGATIVE
Nitrite, UA: NEGATIVE
Protein, UA: POSITIVE — AB
Spec Grav, UA: >=1.03 — AB (ref 1.010–1.025)
Urobilinogen, UA: 0.2 E.U./dL
pH, UA: 5.5 (ref 5.0–8.0)

## 2018-08-06 LAB — GLUCOSE, POCT (MANUAL RESULT ENTRY): POC Glucose: 308 mg/dl — AB (ref 70–99)

## 2018-08-06 MED ORDER — SITAGLIPTIN PHOSPHATE 50 MG PO TABS: 50.0000 mg | ORAL_TABLET | Freq: Every day | ORAL | 2 refills | Status: DC

## 2018-08-06 NOTE — Patient Instructions (Signed)
Carbohydrate Counting for Diabetes Mellitus, Adult  Carbohydrate counting is a method of keeping track of how many carbohydrates you eat. Eating carbohydrates naturally increases the amount of sugar (glucose) in the blood. Counting how many carbohydrates you eat helps keep your blood glucose within normal limits, which helps you manage your diabetes (diabetes mellitus). It is important to know how many carbohydrates you can safely have in each meal. This is different for every person. A diet and nutrition specialist (registered dietitian) can help you make a meal plan and calculate how many carbohydrates you should have at each meal and snack. Carbohydrates are found in the following foods:  Grains, such as breads and cereals.  Dried beans and soy products.  Starchy vegetables, such as potatoes, peas, and corn.  Fruit and fruit juices.  Milk and yogurt.  Sweets and snack foods, such as cake, cookies, candy, chips, and soft drinks. How do I count carbohydrates? There are two ways to count carbohydrates in food. You can use either of the methods or a combination of both. Reading "Nutrition Facts" on packaged food The "Nutrition Facts" list is included on the labels of almost all packaged foods and beverages in the [place] It includes:  The serving size.  Information about nutrients in each serving, including the grams (g) of carbohydrate per serving. To use the "Nutrition Facts":  Decide how many servings you will have.  Multiply the number of servings by the number of carbohydrates per serving.  The resulting number is the total amount of carbohydrates that you will be having. Learning standard serving sizes of other foods When you eat carbohydrate foods that are not packaged or do not include "Nutrition Facts" on the label, you need to measure the servings in order to count the amount of carbohydrates:  Measure the foods that you will eat with a food scale or measuring cup, if needed.   Decide how many standard-size servings you will eat.  Multiply the number of servings by 15. Most carbohydrate-rich foods have about 15 g of carbohydrates per serving. ? For example, if you eat 8 oz (170 g) of strawberries, you will have eaten 2 servings and 30 g of carbohydrates (2 servings x 15 g = 30 g).  For foods that have more than one food mixed, such as soups and casseroles, you must count the carbohydrates in each food that is included. The following list contains standard serving sizes of common carbohydrate-rich foods. Each of these servings has about 15 g of carbohydrates:   hamburger bun or  English muffin.   oz (15 mL) syrup.   oz (14 g) jelly.  1 slice of bread.  1 six-inch tortilla.  3 oz (85 g) cooked rice or pasta.  4 oz (113 g) cooked dried beans.  4 oz (113 g) starchy vegetable, such as peas, corn, or potatoes.  4 oz (113 g) hot cereal.  4 oz (113 g) mashed potatoes or  of a large baked potato.  4 oz (113 g) canned or frozen fruit.  4 oz (120 mL) fruit juice.  4-6 crackers.  6 chicken nuggets.  6 oz (170 g) unsweetened dry cereal.  6 oz (170 g) plain fat-free yogurt or yogurt sweetened with artificial sweeteners.  8 oz (240 mL) milk.  8 oz (170 g) fresh fruit or one small piece of fruit.  24 oz (680 g) popped popcorn. Example of carbohydrate counting Sample meal  3 oz (85 g) chicken breast.  6 oz (170 g)   brown rice.  4 oz (113 g) corn.  8 oz (240 mL) milk.  8 oz (170 g) strawberries with sugar-free whipped topping. Carbohydrate calculation 1. Identify the foods that contain carbohydrates: ? Rice. ? Corn. ? Milk. ? Strawberries. 2. Calculate how many servings you have of each food: ? 2 servings rice. ? 1 serving corn. ? 1 serving milk. ? 1 serving strawberries. 3. Multiply each number of servings by 15 g: ? 2 servings rice x 15 g = 30 g. ? 1 serving corn x 15 g = 15 g. ? 1 serving milk x 15 g = 15 g. ? 1 serving  strawberries x 15 g = 15 g. 4. Add together all of the amounts to find the total grams of carbohydrates eaten: ? 30 g + 15 g + 15 g + 15 g = 75 g of carbohydrates total. Summary  Carbohydrate counting is a method of keeping track of how many carbohydrates you eat.  Eating carbohydrates naturally increases the amount of sugar (glucose) in the blood.  Counting how many carbohydrates you eat helps keep your blood glucose within normal limits, which helps you manage your diabetes.  A diet and nutrition specialist (registered dietitian) can help you make a meal plan and calculate how many carbohydrates you should have at each meal and snack. This information is not intended to replace advice given to you by your health care provider. Make sure you discuss any questions you have with your health care provider. Document Released: 06/06/2005 Document Revised: 12/14/2016 Document Reviewed: 11/18/2015 Elsevier Interactive Patient Education  2019 Elsevier Inc.  

## 2018-08-06 NOTE — Assessment & Plan Note (Signed)
Lab Results  Component Value Date   HGBA1C 10.1 (H) 01/23/2018   Check labs today Pt will try to get [place]--- if unable to afford start amaryl 2 mg daily Refer to endo

## 2018-08-06 NOTE — Progress Notes (Signed)
Patient ID: Dennis Hopkins, male    DOB: Apr 14, 1961  Age: 58 y.o. MRN: [id number]    Subjective:  Subjective  HPI Dennis HolterDarrell W Karpf presents with c/o b/l flank pain and urinary frequency   He is not checking his bs.  He is not taking [place] due to price and never checked online for coupon or pt assistance.  No NVD ,, no abd pain  Review of Systems  Constitutional: Negative for appetite change, diaphoresis, fatigue and unexpected weight change.  Eyes: Negative for pain, redness and visual disturbance.  Respiratory: Negative for cough, chest tightness, shortness of breath and wheezing.   Cardiovascular: Negative for chest pain, palpitations and leg swelling.  Endocrine: Negative for cold intolerance, heat intolerance, polydipsia, polyphagia and polyuria.  Genitourinary: Positive for flank pain and frequency. Negative for difficulty urinating, dysuria and urgency.  Musculoskeletal: Positive for back pain.  Neurological: Negative for dizziness, light-headedness, numbness and headaches.    History Past Medical History:  Diagnosis Date  . Chickenpox   . Hyperlipemia   . Hypertension   . Kidney stones 2013    He has a past surgical history that includes Tonsillectomy; Cardiac catheterization (N/A, 01/07/2016); Coronary artery bypass graft (N/A, 01/13/2016); and TEE without cardioversion (N/A, 01/13/2016).   His family history includes Arthritis in his father and mother; Diabetes in his brother; Hyperlipidemia in his father.He reports that he quit smoking about 4 years ago. His smoking use included cigarettes. He has a 70.00 pack-year smoking history. He has never used smokeless tobacco. He reports current alcohol use. He reports that he does not use drugs.  Current Outpatient Medications on File Prior to Visit  Medication Sig Dispense Refill  . atorvastatin (LIPITOR) 80 MG tablet Take 1 tablet (80 mg total) by mouth daily. 90 tablet 1  . clopidogrel (PLAVIX) 75 MG tablet Take 1 tablet (75 mg  total) by mouth daily. 90 tablet 3  . metoprolol tartrate (LOPRESSOR) 100 MG tablet Take 1 tablet (100 mg total) by mouth 2 (two) times daily. 180 tablet 3  . fenofibrate 160 MG tablet Take 1 tablet (160 mg total) by mouth daily. (Patient not taking: Reported on 08/06/2018) 30 tablet 2   No current facility-administered medications on file prior to visit.      Objective:  Objective  Physical Exam Vitals signs and nursing note reviewed.  Constitutional:      General: He is sleeping.     Appearance: He is well-developed.  HENT:     Head: Normocephalic and atraumatic.  Eyes:     Pupils: Pupils are equal, round, and reactive to light.  Neck:     Musculoskeletal: Normal range of motion and neck supple.     Thyroid: No thyromegaly.  Cardiovascular:     Rate and Rhythm: Normal rate and regular rhythm.     Heart sounds: No murmur.  Pulmonary:     Effort: Pulmonary effort is normal. No respiratory distress.     Breath sounds: Normal breath sounds. No wheezing or rales.  Chest:     Chest wall: No tenderness.  Abdominal:     General: There is no distension.     Tenderness: There is no right CVA tenderness, left CVA tenderness, guarding or rebound.  Musculoskeletal:        General: No tenderness.  Skin:    General: Skin is warm and dry.  Neurological:     Mental Status: He is oriented to person, place, and time.  Psychiatric:  Behavior: Behavior normal.        Thought Content: Thought content normal.        Judgment: Judgment normal.    BP 120/70 (BP Location: Left Arm, Cuff Size: Normal)   Pulse 76   Temp 98.3 F (36.8 C) (Oral)   Resp 16   Ht 5\' 10"  (1.778 m)   Wt 258 lb 3.2 oz (117.1 kg)   SpO2 98%   BMI 37.05 kg/m  Wt Readings from Last 3 Encounters:  08/06/18 258 lb 3.2 oz (117.1 kg)  01/22/18 267 lb 12.8 oz (121.5 kg)  09/12/17 275 lb 6.4 oz (124.9 kg)     Lab Results  Component Value Date   WBC 11.2 (H) 01/16/2016   HGB 10.2 (L) 01/16/2016   HCT 31.8  (L) 01/16/2016   PLT 189 01/16/2016   GLUCOSE 269 (H) 01/23/2018   CHOL 248 (H) 01/23/2018   TRIG 305.0 (H) 01/23/2018   HDL 40.00 01/23/2018   LDLDIRECT 169.0 01/23/2018   LDLCALC UNABLE TO CALCULATE IF TRIGLYCERIDE OVER 400 mg/dL 48/88/9169   ALT 39 45/08/8880   AST 31 01/23/2018   NA 137 01/23/2018   K 4.3 01/23/2018   CL 100 01/23/2018   CREATININE 0.98 01/23/2018   BUN 15 01/23/2018   CO2 28 01/23/2018   TSH 1.54 09/29/2014   INR 1.32 01/13/2016   HGBA1C 10.1 (H) 01/23/2018   MICROALBUR 15.5 (H) 01/22/2018    No results found.   Assessment & Plan:  Plan  I have discontinued Dreden W. Puckett's traMADol, triamcinolone cream, and clotrimazole-betamethasone. I am also having him maintain his clopidogrel, atorvastatin, metoprolol tartrate, fenofibrate, and sitaGLIPtin.  Meds ordered this encounter  Medications  . DISCONTD: sitaGLIPtin (JANUVIA) 50 MG tablet    Sig: Take 1 tablet (50 mg total) by mouth daily.    Dispense:  30 tablet    Refill:  2  . sitaGLIPtin (JANUVIA) 50 MG tablet    Sig: Take 1 tablet (50 mg total) by mouth daily.    Dispense:  30 tablet    Refill:  2    Problem List Items Addressed This Visit      Unprioritized   DM (diabetes mellitus) type II uncontrolled, periph vascular disorder (HCC) - Primary    Lab Results  Component Value Date   HGBA1C 10.1 (H) 01/23/2018   Check labs today Pt will try to get [place]--- if unable to afford start amaryl 2 mg daily Refer to endo      Relevant Medications   sitaGLIPtin (JANUVIA) 50 MG tablet   Other Relevant Orders   Lipid panel   Hemoglobin A1c   Comprehensive metabolic panel   Microalbumin / creatinine urine ratio   Ambulatory referral to Endocrinology   POCT Glucose (CBG) (Completed)    Other Visit Diagnoses    Dysuria       Relevant Orders   POCT Urinalysis Dipstick (Automated) (Completed)   Urine Culture   Lipid panel   Hemoglobin A1c   Comprehensive metabolic panel   Microalbumin /  creatinine urine ratio   Flank pain       Relevant Orders   POCT Urinalysis Dipstick (Automated) (Completed)   Urine Culture   Hyperlipidemia associated with type 2 diabetes mellitus (HCC)       Relevant Medications   sitaGLIPtin (JANUVIA) 50 MG tablet   Other Relevant Orders   Lipid panel   Hemoglobin A1c   Comprehensive metabolic panel   Microalbumin / creatinine urine ratio  Follow-up: Return in about 3 months (around 11/04/2018), or if symptoms worsen or fail to improve, for hypertension, hyperlipidemia, diabetes II.  Donato Schultz, DO

## 2018-08-07 ENCOUNTER — Other Ambulatory Visit: Payer: PRIVATE HEALTH INSURANCE

## 2018-08-07 LAB — MICROALBUMIN / CREATININE URINE RATIO
Creatinine,U: 114.6 mg/dL
Microalb Creat Ratio: 4.6 mg/g (ref 0.0–30.0)
Microalb, Ur: 5.3 mg/dL — ABNORMAL HIGH (ref 0.0–1.9)

## 2018-08-08 LAB — URINE CULTURE
MICRO NUMBER:: 204263
Result:: NO GROWTH
SPECIMEN QUALITY:: ADEQUATE

## 2018-08-10 ENCOUNTER — Telehealth: Payer: Self-pay | Admitting: *Deleted

## 2018-08-10 NOTE — Telephone Encounter (Signed)
Ok to refer to nephrology for proteinuria  But need blood work done

## 2018-08-10 NOTE — Telephone Encounter (Signed)
Patient notified referral placed and he will call to schedule lab work.

## 2018-08-10 NOTE — Telephone Encounter (Signed)
Patient stated that he was still having "kidney pain" and was we going to refer him to a kidney specialist.

## 2018-10-18 ENCOUNTER — Other Ambulatory Visit: Payer: PRIVATE HEALTH INSURANCE

## 2018-10-22 ENCOUNTER — Other Ambulatory Visit (INDEPENDENT_AMBULATORY_CARE_PROVIDER_SITE_OTHER): Payer: PRIVATE HEALTH INSURANCE

## 2018-10-22 ENCOUNTER — Other Ambulatory Visit: Payer: Self-pay

## 2018-10-22 DIAGNOSIS — IMO0002 Reserved for concepts with insufficient information to code with codable children: Secondary | ICD-10-CM

## 2018-10-22 DIAGNOSIS — R300 Dysuria: Secondary | ICD-10-CM | POA: Diagnosis not present

## 2018-10-22 DIAGNOSIS — E1165 Type 2 diabetes mellitus with hyperglycemia: Secondary | ICD-10-CM | POA: Diagnosis not present

## 2018-10-22 DIAGNOSIS — E1169 Type 2 diabetes mellitus with other specified complication: Secondary | ICD-10-CM

## 2018-10-22 DIAGNOSIS — E785 Hyperlipidemia, unspecified: Secondary | ICD-10-CM

## 2018-10-22 DIAGNOSIS — E1151 Type 2 diabetes mellitus with diabetic peripheral angiopathy without gangrene: Secondary | ICD-10-CM

## 2018-10-22 LAB — COMPREHENSIVE METABOLIC PANEL
ALT: 28 U/L (ref 0–53)
AST: 28 U/L (ref 0–37)
Albumin: 4 g/dL (ref 3.5–5.2)
Alkaline Phosphatase: 66 U/L (ref 39–117)
BUN: 21 mg/dL (ref 6–23)
CO2: 24 mEq/L (ref 19–32)
Calcium: 8.9 mg/dL (ref 8.4–10.5)
Chloride: 96 mEq/L (ref 96–112)
Creatinine, Ser: 1.37 mg/dL (ref 0.40–1.50)
GFR: 53.44 mL/min — ABNORMAL LOW (ref >60.00)
Glucose, Bld: 332 mg/dL — ABNORMAL HIGH (ref 70–99)
Potassium: 4 mEq/L (ref 3.5–5.1)
Sodium: 134 mEq/L — ABNORMAL LOW (ref 135–145)
Total Bilirubin: 1 mg/dL (ref 0.2–1.2)
Total Protein: 7.2 g/dL (ref 6.0–8.3)

## 2018-10-22 LAB — HEMOGLOBIN A1C: Hgb A1c MFr Bld: 12.1 % — ABNORMAL HIGH (ref 4.6–6.5)

## 2018-10-22 LAB — LIPID PANEL
Cholesterol: 207 mg/dL — ABNORMAL HIGH (ref 0–200)
HDL: 40.2 mg/dL (ref >39.00)
NonHDL: 166.49
Total CHOL/HDL Ratio: 5
Triglycerides: 238 mg/dL — ABNORMAL HIGH (ref 0.0–149.0)
VLDL: 47.6 mg/dL — ABNORMAL HIGH (ref 0.0–40.0)

## 2018-10-22 LAB — LDL CHOLESTEROL, DIRECT: Direct LDL: 143 mg/dL

## 2018-10-22 NOTE — Addendum Note (Signed)
Addended by: Harley Alto on: 10/22/2018 10:50 AM   Modules accepted: Orders

## 2018-10-25 ENCOUNTER — Other Ambulatory Visit: Payer: Self-pay | Admitting: Family Medicine

## 2018-10-25 DIAGNOSIS — E1169 Type 2 diabetes mellitus with other specified complication: Secondary | ICD-10-CM

## 2018-10-25 DIAGNOSIS — E785 Hyperlipidemia, unspecified: Secondary | ICD-10-CM

## 2018-10-25 DIAGNOSIS — E1165 Type 2 diabetes mellitus with hyperglycemia: Secondary | ICD-10-CM

## 2018-10-31 ENCOUNTER — Ambulatory Visit: Payer: PRIVATE HEALTH INSURANCE | Admitting: Internal Medicine

## 2018-11-05 ENCOUNTER — Telehealth: Payer: Self-pay | Admitting: *Deleted

## 2018-11-05 NOTE — Telephone Encounter (Signed)
Left message on machine to call back to reschedule appointment tomorrow for an earlier virtual appointment

## 2018-11-06 ENCOUNTER — Ambulatory Visit: Payer: PRIVATE HEALTH INSURANCE | Admitting: Family Medicine

## 2018-11-08 ENCOUNTER — Other Ambulatory Visit: Payer: Self-pay

## 2018-11-08 ENCOUNTER — Ambulatory Visit (INDEPENDENT_AMBULATORY_CARE_PROVIDER_SITE_OTHER): Payer: PRIVATE HEALTH INSURANCE | Admitting: Internal Medicine

## 2018-11-08 ENCOUNTER — Encounter: Payer: Self-pay | Admitting: Internal Medicine

## 2018-11-08 DIAGNOSIS — E1165 Type 2 diabetes mellitus with hyperglycemia: Secondary | ICD-10-CM | POA: Diagnosis not present

## 2018-11-08 DIAGNOSIS — IMO0002 Reserved for concepts with insufficient information to code with codable children: Secondary | ICD-10-CM

## 2018-11-08 DIAGNOSIS — E1151 Type 2 diabetes mellitus with diabetic peripheral angiopathy without gangrene: Secondary | ICD-10-CM

## 2018-11-08 MED ORDER — GLIPIZIDE 5 MG PO TABS: 5.0000 mg | ORAL_TABLET | Freq: Two times a day (BID) | ORAL | 3 refills | Status: DC

## 2018-11-08 MED ORDER — SITAGLIPTIN PHOSPHATE 50 MG PO TABS: 50.0000 mg | ORAL_TABLET | Freq: Every day | ORAL | 3 refills | Status: DC

## 2018-11-08 NOTE — Progress Notes (Signed)
Virtual Visit via Video Note  I connected with [name] on 11/08/18 at  3:00 PM EDT by a video enabled telemedicine application and verified that I am speaking with the correct person using two identifiers.   I discussed the limitations of evaluation and management by telemedicine and the availability of in person appointments. The patient expressed understanding and agreed to proceed.  -Location of the patient : Work  -Location of the provider : office  -The names of all persons participating in the telemedicine service : Pt and myself     Name: Dennis Hopkins  MRN/ DOB: [id number], 1960-12-27   Age/ Sex: 58 y.o., male    PCP: Donato Schultz, DO   Reason for Endocrinology Evaluation: Type 2 Diabetes Mellitus     Date of Initial Endocrinology Visit: 11/08/2018     PATIENT IDENTIFIER: Dennis Hopkins is a 58 y.o. male with a past medical history of T2DM, HTN, CAD (S/P CABG in 2017) and Dyslipidemia . The patient presented for initial endocrinology clinic visit on 11/08/2018 for consultative assistance with his diabetes management.    HPI: [name] was    Diagnosed with T2DM in 2017 Prior Medications tried/Intolerance: Metformin  Currently checking blood sugars 0 x / day. Hypoglycemia episodes : no        Hemoglobin A1c has ranged from 6.8% in 2017, peaking at 12.1% in 2020. Patient required assistance for hypoglycemia: no Patient has required hospitalization within the last 1 year from hyper or hypoglycemia: no  In terms of diet, the patient drinks sugar-sweetened beverages, eats 2 meals a day, rarely snacks.    HOME DIABETES REGIMEN: Januvia 50 mg daily - not taking   Statin: yes ACE-I/ARB: no Prior Diabetic Education: Yes   METER DOWNLOAD SUMMARY: does not check    DIABETIC COMPLICATIONS: Microvascular complications:    Denies: CKD, neuropathy   Last eye exam: Completed long time ago   Macrovascular complications:   CAD (S/P CABG 2019)   Denies: PVD, CVA   PAST HISTORY: Past Medical History:  Past Medical History:  Diagnosis Date  . Chickenpox   . Hyperlipemia   . Hypertension   . Kidney stones 2013    Past Surgical History:  Past Surgical History:  Procedure Laterality Date  . CARDIAC CATHETERIZATION N/A 01/07/2016   Procedure: Left Heart Cath and Coronary Angiography;  Surgeon: Corky Crafts, MD;  Location: [facility] INVASIVE CV LAB;  Service: Cardiovascular;  Laterality: N/A;  . CORONARY ARTERY BYPASS GRAFT N/A 01/13/2016   Procedure: CORONARY ARTERY BYPASS GRAFTING (CABG) x4 Endoscopic Harvesting of the Right Greater Saphenous Vein;  Surgeon: Loreli Slot, MD;  Location: [facility] OR;  Service: Open Heart Surgery;  Laterality: N/A;  . TEE WITHOUT CARDIOVERSION N/A 01/13/2016   Procedure: TRANSESOPHAGEAL ECHOCARDIOGRAM (TEE);  Surgeon: Loreli Slot, MD;  Location: [facility] OR;  Service: Open Heart Surgery;  Laterality: N/A;  . TONSILLECTOMY        Social History:  reports that he quit smoking about 4 years ago. His smoking use included cigarettes. He has a 70.00 pack-year smoking history. He has never used smokeless tobacco. He reports current alcohol use. He reports that he does not use drugs. Family History:  Family History  Problem Relation Age of Onset  . Arthritis Mother   . Arthritis Father   . Hyperlipidemia Father   . Diabetes Brother       HOME MEDICATIONS: Allergies as of 11/08/2018  Reactions   Asa [aspirin] Anaphylaxis, Swelling   Lips swelling      Medication List       Accurate as of Nov 08, 2018  9:56 AM. If you have any questions, ask your nurse or doctor.        atorvastatin 80 MG tablet Commonly known as:  LIPITOR Take 1 tablet (80 mg total) by mouth daily.   clopidogrel 75 MG tablet Commonly known as:  PLAVIX Take 1 tablet (75 mg total) by mouth daily.   fenofibrate 160 MG tablet Take 1 tablet (160 mg total) by mouth daily.   metoprolol tartrate 100 MG tablet  Commonly known as:  LOPRESSOR Take 1 tablet (100 mg total) by mouth 2 (two) times daily.   sitaGLIPtin 50 MG tablet Commonly known as:  Januvia Take 1 tablet (50 mg total) by mouth daily.        ALLERGIES: Allergies  Allergen Reactions  . Asa [Aspirin] Anaphylaxis and Swelling    Lips swelling     REVIEW OF SYSTEMS: A comprehensive ROS was conducted with the patient and is negative except as per HPI and below:  Review of Systems  Constitutional: Negative for weight loss.  HENT: Negative for congestion and sore throat.   Eyes: Positive for blurred vision. Negative for pain.  Respiratory: Negative for cough and shortness of breath.   Cardiovascular: Negative for chest pain and palpitations.  Gastrointestinal: Negative for diarrhea and nausea.  Genitourinary: Positive for frequency.  Musculoskeletal: Positive for back pain.  Neurological: Positive for tingling. Negative for tremors.       In the hands   Endo/Heme/Allergies: Negative for polydipsia.  Psychiatric/Behavioral: Negative for depression. The patient is not nervous/anxious.         DATA REVIEWED:  Lab Results  Component Value Date   HGBA1C 12.1 (H) 10/22/2018   HGBA1C 10.1 (H) 01/23/2018   HGBA1C 10.2 (H) 09/12/2017   Lab Results  Component Value Date   MICROALBUR 5.3 (H) 08/06/2018   LDLCALC UNABLE TO CALCULATE IF TRIGLYCERIDE OVER 400 mg/dL 16/10/960407/21/2017   CREATININE 1.37 10/22/2018   Lab Results  Component Value Date   MICRALBCREAT 4.6 08/06/2018    Lab Results  Component Value Date   CHOL 207 (H) 10/22/2018   HDL 40.20 10/22/2018   LDLCALC UNABLE TO CALCULATE IF TRIGLYCERIDE OVER 400 mg/dL 54/09/811907/21/2017   LDLDIRECT 143.0 10/22/2018   TRIG 238.0 (H) 10/22/2018   CHOLHDL 5 10/22/2018        ASSESSMENT / PLAN / RECOMMENDATIONS:   1) Type 2 Diabetes Mellitus, Poorly controlled, With Macrovascular complications - Most recent A1c of 12.1 %. Goal A1c < 7.0 %.    Plan: GENERAL: Poorly controlled  diabetes is due to medication non-adherence and dietary indiscretions. I have discussed with the patient the pathophysiology of diabetes. We went over the natural progression of the disease. We talked about both insulin resistance and insulin deficiency. We stressed the importance of lifestyle changes including diet and exercise. I explained the complications associated with diabetes including retinopathy, nephropathy, neuropathy as well as increased risk of cardiovascular disease. We went over the benefit seen with glycemic control.    I explained to the patient that diabetic patients are at higher than normal risk for amputations. The patient was informed that diabetes is the number one cause of non-traumatic amputations in [place].   Pt declines to check glucose at home , states he is scared of needle. We discussed the importance of glucose checks.  He  has this flank pain that he attributes to taking Metformin in the past and januvia , I have reassured him that neither one would cause such symptoms, but not taking medications will cause hyperglycemia and subsequently renal failure.   I have advised him that with an A1c of 12.1%, the best way to bring his glucose down is with insulin , but pt rejects this.   He also declined  restarting Metformin  Pt is concerned about the cost of medications   Will start with Glpizide, pt agreed to have his wife check his glucose in 2 weeks, and he will contact me should his BG remain over 200 mg/dL.   MEDICATIONS:  Start Glipizide 5 mg BID   Januvia 50 mg daily   EDUCATION / INSTRUCTIONS:  BG monitoring instructions: Patient is instructed to check his blood sugars 1 times a day, fasting.  Call [facility] if: BG persistently < 70 or > 300. . I reviewed the Rule of 15 for the treatment of hypoglycemia in detail with the patient. Literature supplied.   2) Diabetic complications:   Eye: Unknown to have diabetic retinopathy. I have  strongly urged him to have an eye exam soon  Neuro/ Feet: Does not have known diabetic peripheral neuropathy.  Renal: Patient does not have known baseline CKD. He is not on an ACEI/ARB at present. Check urine albumin/creatinine ratio yearly starting at time of diagnosis. If albuminuria is positive, treatment is geared toward better glucose, blood pressure control and use of ACE inhibitors or ARBs. Monitor electrolytes and creatinine once to twice yearly.   3) Lipids: Patient is not taking his statin. We discussed the cardiovascular benefits of statins and he was encouraged to restart again    4) Hypertension: Historically this has been fluctuating.        I discussed the assessment and treatment plan with the patient. The patient was provided an opportunity to ask questions and all were answered. The patient agreed with the plan and demonstrated an understanding of the instructions.   The patient was advised to call back or seek an in-person evaluation if the symptoms worsen or if the condition fails to improve as anticipated.  F/u in 3 months    Signed electronically by: Lyndle Herrlich, MD  [facility] Endocrinology  [facility] [address] [place], [place] [address] Phone: [phone] FAX: [phone]   CC: [place] [address] [place] [address] Phone: [phone] Fax: [phone]   Return to Endocrinology clinic as below: Future Appointments  Date Time Provider Department Center  11/08/2018  3:00 PM Maddix Kliewer, Konrad Dolores, MD [facility] None  11/15/2018  4:00 PM Donato Schultz, DO [facility]

## 2018-11-15 ENCOUNTER — Encounter: Payer: Self-pay | Admitting: Family Medicine

## 2018-11-15 ENCOUNTER — Other Ambulatory Visit: Payer: Self-pay

## 2018-11-15 ENCOUNTER — Ambulatory Visit (INDEPENDENT_AMBULATORY_CARE_PROVIDER_SITE_OTHER): Payer: PRIVATE HEALTH INSURANCE | Admitting: Family Medicine

## 2018-11-15 DIAGNOSIS — E785 Hyperlipidemia, unspecified: Secondary | ICD-10-CM

## 2018-11-15 DIAGNOSIS — E1165 Type 2 diabetes mellitus with hyperglycemia: Secondary | ICD-10-CM | POA: Diagnosis not present

## 2018-11-15 DIAGNOSIS — I2510 Atherosclerotic heart disease of native coronary artery without angina pectoris: Secondary | ICD-10-CM | POA: Diagnosis not present

## 2018-11-15 DIAGNOSIS — IMO0002 Reserved for concepts with insufficient information to code with codable children: Secondary | ICD-10-CM

## 2018-11-15 DIAGNOSIS — E1151 Type 2 diabetes mellitus with diabetic peripheral angiopathy without gangrene: Secondary | ICD-10-CM

## 2018-11-15 DIAGNOSIS — G8929 Other chronic pain: Secondary | ICD-10-CM

## 2018-11-15 DIAGNOSIS — M545 Low back pain, unspecified: Secondary | ICD-10-CM

## 2018-11-15 MED ORDER — LISINOPRIL 5 MG PO TABS: 5.0000 mg | ORAL_TABLET | Freq: Every day | ORAL | 3 refills | Status: DC

## 2018-11-15 MED ORDER — CYCLOBENZAPRINE HCL 10 MG PO TABS: 10.0000 mg | ORAL_TABLET | Freq: Three times a day (TID) | ORAL | 0 refills | Status: DC | PRN

## 2018-11-15 NOTE — Assessment & Plan Note (Signed)
Per endo °

## 2018-11-15 NOTE — Progress Notes (Signed)
Virtual Visit via Video Note  I connected with Dennis Hopkins on 11/15/18 at  4:00 PM EDT by a video enabled telemedicine application and verified that I am speaking with the correct person using two identifiers.  Location: Patient work  Provider: home    I discussed the limitations of evaluation and management by telemedicine and the availability of in person appointments. The patient expressed understanding and agreed to proceed.  History of Present Illness: Pt is at work with no complaints except he is still having some back spasms He did see endocrine and meds have been adjusted   Past Medical History:  Diagnosis Date  . Chickenpox   . Hyperlipemia   . Hypertension   . Kidney stones 2013   Current Outpatient Medications on File Prior to Visit  Medication Sig Dispense Refill  . atorvastatin (LIPITOR) 80 MG tablet Take 1 tablet (80 mg total) by mouth daily. 90 tablet 1  . clopidogrel (PLAVIX) 75 MG tablet Take 1 tablet (75 mg total) by mouth daily. (Patient not taking: Reported on 11/08/2018) 90 tablet 3  . fenofibrate 160 MG tablet Take 1 tablet (160 mg total) by mouth daily. (Patient not taking: Reported on 08/06/2018) 30 tablet 2  . glipiZIDE (GLUCOTROL) 5 MG tablet Take 1 tablet (5 mg total) by mouth 2 (two) times daily before a meal. 60 tablet 3  . metoprolol tartrate (LOPRESSOR) 100 MG tablet Take 1 tablet (100 mg total) by mouth 2 (two) times daily. 180 tablet 3  . sitaGLIPtin (JANUVIA) 50 MG tablet Take 1 tablet (50 mg total) by mouth daily. 90 tablet 3   No current facility-administered medications on file prior to visit.       Observations/Objective: .pt unable to check vitals Pt is in NAD  Assessment and Plan:  1. Type 2 diabetes mellitus with hyperglycemia, without long-term current use of insulin (HCC) Per endo microalbumin was elevated---last time it was checked  - lisinopril (ZESTRIL) 5 MG tablet; Take 1 tablet (5 mg total) by mouth daily.  Dispense: 30  tablet; Refill: 3  2. Coronary artery disease involving native coronary artery of native heart, angina presence unspecified Pt needs cardiology f/u  - Ambulatory referral to Cardiology  3. Chronic low back pain without sciatica, unspecified back pain laterality Improving slowly  - cyclobenzaprine (FLEXERIL) 10 MG tablet; Take 1 tablet (10 mg total) by mouth 3 (three) times daily as needed for muscle spasms.  Dispense: 30 tablet; Refill: 0  4. Hyperlipidemia LDL goal <70 Tolerating statin, encouraged heart healthy diet, avoid trans fats, minimize simple carbs and saturated fats. Increase exercise as tolerated Recheck labs in 3 months ---lipitor just restarted   5. DM (diabetes mellitus) type II uncontrolled, periph vascular disorder (HCC) Per endo  Follow Up Instructions:    I discussed the assessment and treatment plan with the patient. The patient was provided an opportunity to ask questions and all were answered. The patient agreed with the plan and demonstrated an understanding of the instructions.   The patient was advised to call back or seek an in-person evaluation if the symptoms worsen or if the condition fails to improve as anticipated.  I provided 25 minutes of non-face-to-face time during this encounter.   Donato Schultz, DO

## 2018-11-15 NOTE — Assessment & Plan Note (Signed)
Pt started his lipitor again Recheck labs 3 months with ov

## 2019-02-05 ENCOUNTER — Other Ambulatory Visit: Payer: Self-pay | Admitting: Family Medicine

## 2019-02-05 DIAGNOSIS — I10 Essential (primary) hypertension: Secondary | ICD-10-CM

## 2019-02-07 ENCOUNTER — Ambulatory Visit: Payer: Self-pay | Admitting: *Deleted

## 2019-02-07 NOTE — Telephone Encounter (Signed)
Dennis Hopkins , wife called in concerned about his having abd pain.    I returned her call.  Dennis Hopkins is having severe pain that is coming from his back around into his right lower abd.    It will grab  Him and hurt so bad he almost goes to the floor.' He came home for lunch and was in such pain he was almost crying.    "My husband doesn't cry so I know it's bad".   He went back to work.   Dennis Hopkins said he won't go to the doctor if he can help it.     I let her know he needs to go to the ED.   He is also having blood in his urine.   He has a strong history of several kidney stones over the last 3-4 yrs that he has passed.  She is going to pick him up from work and take him to the ED at [facility] now  I sent these notes to Dr. Carollee Herter so she would be aware of the referral.  .   Reason for Disposition . [1] SEVERE pain (e.g., excruciating) AND [2] present > 1 hour    History of kidney stones  Answer Assessment - Initial Assessment Questions 1. LOCATION: "Where does it hurt?"      He went to an urgent care over the weekend they didn't know what was going on.     They gave him a muscle relaxer. Dennis Hopkins called.   The pain on the right side of his abdominal.     Last Wednesday a week ago.  Today came home from work because in so much pain.    2. RADIATION: "Does the pain shoot anywhere else?" (e.g., chest, back)     It's coming from back to side  On right. 3. ONSET: "When did the pain begin?" (Minutes, hours or days ago)      A week ago Wednesday. 4. SUDDEN: "Gradual or sudden onset?"     Gradually worse    Today it came on all at once.     He has a history of kidney stones.   A strong history over the last 3-4 times over 3 yrs. 5. PATTERN "Does the pain come and go, or is it constant?"    - If constant: "Is it getting better, staying the same, or worsening?"      (Note: Constant means the pain never goes away completely; most serious pain is constant and it progresses)      - If intermittent: "How long does it last?" "Do you have pain now?"     (Note: Intermittent means the pain goes away completely between bouts)     Constant.   6. SEVERITY: "How bad is the pain?"  (e.g., Scale 1-10; mild, moderate, or severe)    - MILD (1-3): doesn't interfere with normal activities, abdomen soft and not tender to touch     - MODERATE (4-7): interferes with normal activities or awakens from sleep, tender to touch     - SEVERE (8-10): excruciating pain, doubled over, unable to do any normal activities       Severe today 7. RECURRENT SYMPTOM: "Have you ever had this type of abdominal pain before?" If so, ask: "When was the last time?" and "What happened that time?"      Yes.   He was crying today and he don't cry.  8. CAUSE: "What do you think is  causing the abdominal pain?"     A kidney stone   Had many stones. 9. RELIEVING/AGGRAVATING FACTORS: "What makes it better or worse?" (e.g., movement, antacids, bowel movement)     It grabs and he just about goes to the floor. 10. OTHER SYMPTOMS: "Has there been any vomiting, diarrhea, constipation, or urine problems?"       He has had blood in his urine.  Protocols used: ABDOMINAL PAIN - MALE-A-AH

## 2019-02-08 ENCOUNTER — Ambulatory Visit: Payer: PRIVATE HEALTH INSURANCE | Admitting: Internal Medicine

## 2019-02-08 NOTE — Progress Notes (Deleted)
Name: Dennis Hopkins  Age/ Sex: 58 y.o., male   MRN/ DOB: [id number], 05-03-1961     PCP: [name], DO   Reason for Endocrinology Evaluation: Type 2 Diabetes Mellitus  Initial Endocrine Consultative Visit: 11/08/2018    PATIENT IDENTIFIER: Dennis Hopkins is a 58 y.o. male with a past medical history of T2DM, HTN, CAD (S/P CABG in 2017) and Dyslipidemia  . The patient has followed with Endocrinology clinic since 11/08/2018 for consultative assistance with management of his diabetes.  DIABETIC HISTORY:  Dennis Hopkins was diagnosed with T2DM in 2017, has been on metformin in the past, then [place] introduced. His hemoglobin A1c has ranged from 6.8% in 2017, peaking at 12.1% in 2020.  On his initial visit to our clinic his A1c 12.1%. He was on Januvia . Glipizide started in 10/2018   SUBJECTIVE:   During the last visit (11/08/2018): A1c 12.1%. Continued Januvia and started Glipizide.   Today (02/08/2019): Dennis Hopkins  He checks his blood sugars *** times daily, preprandial to breakfast and ***. The patient has *** had hypoglycemic episodes since the last clinic visit, which typically occur *** x / - most often occuring ***. The patient is *** symptomatic with these episodes, with symptoms of {symptoms; hypoglycemia:9084048}. Otherwise, the patient {HAS/HAS NOT:522402} required any recent emergency interventions for hypoglycemia and {HAS/HAS NOT:522402} had recent hospitalizations secondary to hyper or hypoglycemic episodes.    ROS: As per HPI and as detailed below: ROS    HOME DIABETES REGIMEN:  Glpizide 5 mg BID  Januvia 50 mg daily     METER DOWNLOAD SUMMARY: Date range evaluated: *** Fingerstick Blood Glucose Tests = *** Average Number Tests/Day = *** Overall Mean FS Glucose = *** Standard Deviation = ***  BG Ranges: Low = *** High = ***   Hypoglycemic Events/30 Days: BG < 50 = ***  Episodes of symptomatic severe hypoglycemia = ***    HISTORY:  Past Medical History:  Past Medical History:  Diagnosis Date  . Chickenpox   . Hyperlipemia   . Hypertension   . Kidney stones 2013    Past Surgical History:  Past Surgical History:  Procedure Laterality Date  . CARDIAC CATHETERIZATION N/A 01/07/2016   Procedure: Left Heart Cath and Coronary Angiography;  Surgeon: Jettie Booze, MD;  Location: [facility];  Service: Cardiovascular;  Laterality: N/A;  . CORONARY ARTERY BYPASS GRAFT N/A 01/13/2016   Procedure: CORONARY ARTERY BYPASS GRAFTING (CABG) x4 Endoscopic Harvesting of the Right Greater Saphenous Vein;  Surgeon: Melrose Nakayama, MD;  Location: [place];  Service: Open Heart Surgery;  Laterality: N/A;  . TEE WITHOUT CARDIOVERSION N/A 01/13/2016   Procedure: TRANSESOPHAGEAL ECHOCARDIOGRAM (TEE);  Surgeon: Melrose Nakayama, MD;  Location: [place];  Service: Open Heart Surgery;  Laterality: N/A;  . TONSILLECTOMY       Social History:  reports that he quit smoking about 5 years ago. His smoking use included cigarettes. He has a 70.00 pack-year smoking history. He has never used smokeless tobacco. He reports current alcohol use. He reports that he does not use drugs. Family History:  Family History  Problem Relation Age of Onset  . Arthritis Mother   . Diabetes Mother   . Arthritis Father   . Hyperlipidemia Father   . Diabetes Father   . Diabetes Brother       HOME MEDICATIONS: Allergies as of 02/08/2019      Reactions   Asa [aspirin] Anaphylaxis, Swelling  Lips swelling      Medication List       Accurate as of February 08, 2019 12:52 PM. If you have any questions, ask your nurse or doctor.        atorvastatin 80 MG tablet Commonly known as: LIPITOR Take 1 tablet (80 mg total) by mouth daily.   clopidogrel 75 MG tablet Commonly known as: PLAVIX Take 1 tablet (75 mg total) by mouth daily.   cyclobenzaprine 10 MG tablet Commonly  known as: FLEXERIL Take 1 tablet (10 mg total) by mouth 3 (three) times daily as needed for muscle spasms.   fenofibrate 160 MG tablet Take 1 tablet (160 mg total) by mouth daily.   glipiZIDE 5 MG tablet Commonly known as: GLUCOTROL Take 1 tablet (5 mg total) by mouth 2 (two) times daily before a meal.   lisinopril 5 MG tablet Commonly known as: ZESTRIL Take 1 tablet (5 mg total) by mouth daily.   metoprolol tartrate 100 MG tablet Commonly known as: LOPRESSOR Take 1 tablet by mouth twice daily   sitaGLIPtin 50 MG tablet Commonly known as: Januvia Take 1 tablet (50 mg total) by mouth daily.        OBJECTIVE:   Vital Signs: There were no vitals taken for this visit.  Wt Readings from Last 3 Encounters:  08/06/18 258 lb 3.2 oz (117.1 kg)  01/22/18 267 lb 12.8 oz (121.5 kg)  09/12/17 275 lb 6.4 oz (124.9 kg)     Exam: General: Pt appears well and is in NAD  Hydration: Well-hydrated with moist mucous membranes and good skin turgor  HEENT: Head: Unremarkable with good dentition. Oropharynx clear without exudate.  Eyes: External eye exam normal without stare, lid lag or exophthalmos.  EOM intact.  PERRL.  Neck: General: Supple without adenopathy. Thyroid: Thyroid size normal.  No goiter or nodules appreciated. No thyroid bruit.  Lungs: Clear with good BS bilat with no rales, rhonchi, or wheezes  Heart: RRR with normal S1 and S2 and no gallops; no murmurs; no rub  Abdomen: Normoactive bowel sounds, soft, nontender, without masses or organomegaly palpable  Extremities: No pretibial edema. No tremor. Normal strength and motion throughout. See detailed diabetic foot exam below.  Skin: Normal texture and temperature to palpation. No rash noted. No Acanthosis nigricans/skin tags. No lipohypertrophy.  Neuro: MS is good with appropriate affect, pt is alert and Ox3    DM foot exam: Please see diabetic assessment flow-sheet detailed below:           DATA REVIEWED:  Lab  Results  Component Value Date   HGBA1C 12.1 (H) 10/22/2018   HGBA1C 10.1 (H) 01/23/2018   HGBA1C 10.2 (H) 09/12/2017   Lab Results  Component Value Date   MICROALBUR 5.3 (H) 08/06/2018   LDLCALC UNABLE TO CALCULATE IF TRIGLYCERIDE OVER 400 mg/dL 16/10/960407/21/2017   CREATININE 1.37 10/22/2018   Lab Results  Component Value Date   MICRALBCREAT 4.6 08/06/2018     Lab Results  Component Value Date   CHOL 207 (H) 10/22/2018   HDL 40.20 10/22/2018   LDLCALC UNABLE TO CALCULATE IF TRIGLYCERIDE OVER 400 mg/dL 54/09/811907/21/2017   LDLDIRECT 143.0 10/22/2018   TRIG 238.0 (H) 10/22/2018   CHOLHDL 5 10/22/2018         ASSESSMENT / PLAN / RECOMMENDATIONS:   1) Type {NUMBERS 1 OR 2:522190} Diabetes Mellitus, ***controlled, With *** complications - Most recent A1c of *** %. Goal A1c < *** %.  ***  Plan: MEDICATIONS:  ***  EDUCATION / INSTRUCTIONS:  BG monitoring instructions: Patient is instructed to check his blood sugars *** times a day, ***.  Call [facility] if: BG persistently < 70 or > 300. . I reviewed the Rule of 15 for the treatment of hypoglycemia in detail with the patient. Literature supplied.  REFERRALS:  ***.   2) Diabetic complications:   Eye: Does *** have known diabetic retinopathy.   Neuro/ Feet: Does *** have known diabetic peripheral neuropathy .   Renal: Patient does *** have known baseline CKD. He   is *** on an ACEI/ARB at present. Check urine albumin/creatinine ratio yearly starting at time of diagnosis. If albuminuria is positive, treatment is geared toward better glucose, blood pressure control and use of ACE inhibitors or ARBs. Monitor electrolytes and creatinine once to twice yearly.   3) Lipids: Patient is *** on a statin.  4) Hypertension: *** at goal of < 140/90 mmHg.    F/U in ***    Signed electronically by: Lyndle HerrlichAbby Jaralla Amaris Delafuente, MD  [facility]  [facility] [address] [place],  [place] [address] Phone: [phone] FAX: [phone]   CC: Virgina OrganLowne Chase, Yvonne R, DO [address] [place] [address] Phone: [phone]  Fax: [phone]  Return to Endocrinology clinic as below: Future Appointments  Date Time Provider Department Center  02/08/2019  2:00 PM Alondria Mousseau, Konrad DoloresIbtehal Jaralla, MD [id number] None

## 2019-02-09 ENCOUNTER — Emergency Department (HOSPITAL_BASED_OUTPATIENT_CLINIC_OR_DEPARTMENT_OTHER)
Admission: EM | Admit: 2019-02-09 | Discharge: 2019-02-09 | Disposition: A | Discharge status: Home / Self Care | Payer: PRIVATE HEALTH INSURANCE | Attending: Emergency Medicine | Admitting: Emergency Medicine

## 2019-02-09 ENCOUNTER — Other Ambulatory Visit: Payer: Self-pay

## 2019-02-09 ENCOUNTER — Emergency Department (HOSPITAL_BASED_OUTPATIENT_CLINIC_OR_DEPARTMENT_OTHER): Payer: PRIVATE HEALTH INSURANCE

## 2019-02-09 ENCOUNTER — Encounter (HOSPITAL_BASED_OUTPATIENT_CLINIC_OR_DEPARTMENT_OTHER): Payer: Self-pay | Admitting: *Deleted

## 2019-02-09 DIAGNOSIS — R109 Unspecified abdominal pain: Secondary | ICD-10-CM | POA: Diagnosis not present

## 2019-02-09 DIAGNOSIS — Y929 Unspecified place or not applicable: Secondary | ICD-10-CM | POA: Insufficient documentation

## 2019-02-09 DIAGNOSIS — Y999 Unspecified external cause status: Secondary | ICD-10-CM | POA: Diagnosis not present

## 2019-02-09 DIAGNOSIS — I25118 Atherosclerotic heart disease of native coronary artery with other forms of angina pectoris: Secondary | ICD-10-CM | POA: Insufficient documentation

## 2019-02-09 DIAGNOSIS — X58XXXA Exposure to other specified factors, initial encounter: Secondary | ICD-10-CM | POA: Insufficient documentation

## 2019-02-09 DIAGNOSIS — Z951 Presence of aortocoronary bypass graft: Secondary | ICD-10-CM | POA: Insufficient documentation

## 2019-02-09 DIAGNOSIS — Z79899 Other long term (current) drug therapy: Secondary | ICD-10-CM | POA: Insufficient documentation

## 2019-02-09 DIAGNOSIS — I10 Essential (primary) hypertension: Secondary | ICD-10-CM | POA: Diagnosis not present

## 2019-02-09 DIAGNOSIS — Z87891 Personal history of nicotine dependence: Secondary | ICD-10-CM | POA: Diagnosis not present

## 2019-02-09 DIAGNOSIS — E119 Type 2 diabetes mellitus without complications: Secondary | ICD-10-CM | POA: Insufficient documentation

## 2019-02-09 DIAGNOSIS — Y939 Activity, unspecified: Secondary | ICD-10-CM | POA: Insufficient documentation

## 2019-02-09 DIAGNOSIS — S39012A Strain of muscle, fascia and tendon of lower back, initial encounter: Secondary | ICD-10-CM

## 2019-02-09 DIAGNOSIS — S3992XA Unspecified injury of lower back, initial encounter: Secondary | ICD-10-CM | POA: Diagnosis present

## 2019-02-09 LAB — URINALYSIS, ROUTINE W REFLEX MICROSCOPIC
Bilirubin Urine: NEGATIVE
Glucose, UA: >=500 mg/dL — AB
Hgb urine dipstick: NEGATIVE
Ketones, ur: NEGATIVE mg/dL
Leukocytes,Ua: NEGATIVE
Nitrite: NEGATIVE
Protein, ur: NEGATIVE mg/dL
Specific Gravity, Urine: 1.025 (ref 1.005–1.030)
pH: 5 (ref 5.0–8.0)

## 2019-02-09 LAB — URINALYSIS, MICROSCOPIC (REFLEX)

## 2019-02-09 MED ORDER — LIDOCAINE 5 % EX PTCH: 1.0000 | MEDICATED_PATCH | CUTANEOUS | 0 refills | Status: DC

## 2019-02-09 MED ORDER — OXYCODONE-ACETAMINOPHEN 5-325 MG PO TABS
1.0000 | ORAL_TABLET | Freq: Once | ORAL | Status: AC
  Administered 2019-02-09: 1 via ORAL
  Filled 2019-02-09: qty 1

## 2019-02-09 MED ORDER — METHOCARBAMOL 500 MG PO TABS: 500.0000 mg | ORAL_TABLET | Freq: Two times a day (BID) | ORAL | 0 refills | Status: DC

## 2019-02-09 NOTE — ED Provider Notes (Signed)
[facility] EMERGENCY DEPARTMENT Provider Note   CSN: [id number] Arrival date & time: 02/09/19  1336     History   Chief Complaint Chief Complaint  Patient presents with   Back Pain    HPI Dennis Hopkins is a 58 y.o. male with a past medical history of diabetes, kidney stones, CAD presents to ED for left lower back pain for the past 2 weeks.  Describes the pain as aching, sharp worse with movement, palpation and no specific aggravating or alleviating factor.  He is concerned that he has another kidney stone.  He cannot recall any inciting event that may have triggered the symptoms but he does do heavy lifting at work.  He has tried extra strength Tylenol with no improvement in his symptoms.  He last had a kidney stone over 5 years ago which passed on its own.  He denies any vomiting but does endorse nausea.  Denies any abdominal pain, shortness of breath, chest pain, dysuria, hematuria, numbness in arms or legs, prior back surgeries.     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   Coronary artery disease 01/12/2016   Exertional angina (HCC)    Chest pain 11/11/2015   HTN (hypertension) 10/20/2015   Hyperlipidemia LDL goal <70 10/20/2015   Preventative health care 09/29/2014   Obesity (BMI 30-39.9) 09/26/2013   Tobacco use disorder 09/26/2013    Past Surgical History:  Procedure Laterality Date   CARDIAC CATHETERIZATION N/A 01/07/2016   Procedure: Left Heart Cath and Coronary Angiography;  Surgeon: Corky CraftsJayadeep S Varanasi, MD;  [facility];  Service: Cardiovascular;  Laterality: N/A;   CORONARY ARTERY BYPASS GRAFT N/A 01/13/2016   Procedure: CORONARY ARTERY BYPASS  GRAFTING (CABG) x4 Endoscopic Harvesting of the Right Greater Saphenous Vein;  Surgeon: Loreli SlotSteven C Hendrickson, MD;  [facility];  Service: Open Heart Surgery;  Laterality: N/A;   TEE WITHOUT CARDIOVERSION N/A 01/13/2016   Procedure: TRANSESOPHAGEAL ECHOCARDIOGRAM (TEE);  Surgeon: Loreli SlotSteven C Hendrickson, MD;  [facility];  Service: Open Heart Surgery;  Laterality: N/A;   TONSILLECTOMY          Home Medications    Prior to Admission medications   Medication Sig Start Date End Date Taking? Authorizing Provider  atorvastatin (LIPITOR) 80 MG tablet Take 1 tablet (80 mg total) by mouth daily. 10/20/17  Yes Donato SchultzLowne Chase, Yvonne R, DO  cyclobenzaprine (FLEXERIL) 10 MG tablet Take 1 tablet (10 mg total) by mouth 3 (three) times daily as needed for muscle spasms. 11/15/18  Yes Seabron SpatesLowne Chase, Yvonne R, DO  lisinopril (ZESTRIL) 5 MG tablet Take 1 tablet (5 mg total) by mouth daily. 11/15/18  Yes Seabron SpatesLowne Chase, Yvonne R, DO  metoprolol tartrate (LOPRESSOR) 100 MG tablet Take 1 tablet by mouth twice daily 02/05/19  Yes Lowne Florina Ouhase, Yvonne R, DO  clopidogrel (PLAVIX) 75 MG tablet Take 1 tablet (75 mg total) by mouth daily. Patient not taking: Reported on 11/08/2018 09/12/17   Zola ButtonLowne Chase, Yvonne R, DO  FARXIGA 5 MG TABS tablet TAKE 1 TABLET BY MOUTH ONCE DAILY FOR 30 DAYS 02/01/19   [provider]  fenofibrate 160 MG tablet Take 1 tablet (  160 mg total) by mouth daily. Patient not taking: Reported on 08/06/2018 01/26/18   Carollee Herter, Alferd Apa, DO  glipiZIDE (GLUCOTROL) 5 MG tablet Take 1 tablet (5 mg total) by mouth 2 (two) times daily before a meal. 11/08/18   Shamleffer, Melanie Crazier, MD  lidocaine (LIDODERM) 5 % Place 1 patch onto the skin daily. Remove & Discard patch within 12 hours or as directed by MD 02/09/19   Delia Heady, PA-C  methocarbamol (ROBAXIN) 500 MG tablet Take 1 tablet (500 mg total) by mouth 2 (two) times daily. 02/09/19   Faustine Tates, PA-C  sitaGLIPtin (JANUVIA) 50 MG tablet Take 1  tablet (50 mg total) by mouth daily. 11/08/18   Shamleffer, Melanie Crazier, MD    Family History Family History  Problem Relation Age of Onset   Arthritis Mother    Diabetes Mother    Arthritis Father    Hyperlipidemia Father    Diabetes Father    Diabetes Brother     Social History Social History   Tobacco Use   Smoking status: Former Smoker    Packs/day: 2.00    Years: 35.00    Pack years: 70.00    Types: Cigarettes    Quit date: 12/21/2013    Years since quitting: 5.1   Smokeless tobacco: Never Used  Substance Use Topics   Alcohol use: Yes    Alcohol/week: 0.0 standard drinks    Comment: 1-2 drinks per night   Drug use: No     Allergies   Asa [aspirin]   Review of Systems Review of Systems  Constitutional: Negative for appetite change, chills and fever.  HENT: Negative for ear pain, rhinorrhea, sneezing and sore throat.   Eyes: Negative for photophobia and visual disturbance.  Respiratory: Negative for cough, chest tightness, shortness of breath and wheezing.   Cardiovascular: Negative for chest pain and palpitations.  Gastrointestinal: Positive for nausea. Negative for abdominal pain, blood in stool, constipation, diarrhea and vomiting.  Genitourinary: Negative for dysuria, hematuria and urgency.  Musculoskeletal: Positive for back pain. Negative for myalgias.  Skin: Negative for rash.  Neurological: Negative for dizziness, weakness and light-headedness.     Physical Exam Updated Vital Signs BP (!) 163/87 (BP Location: Left Arm)    Pulse 70    Temp 98.8 F (37.1 C) (Oral)    Resp 16    Ht 5\' 10"  (1.778 m)    Wt 117.9 kg    SpO2 100%    BMI 37.31 kg/m   Physical Exam Vitals signs and nursing note reviewed.  Constitutional:      General: He is not in acute distress.    Appearance: He is well-developed.  HENT:     Head: Normocephalic and atraumatic.     Nose: Nose normal.  Eyes:     General: No scleral icterus.       Left eye: No  discharge.     Conjunctiva/sclera: Conjunctivae normal.  Neck:     Musculoskeletal: Normal range of motion and neck supple.  Cardiovascular:     Rate and Rhythm: Normal rate and regular rhythm.     Heart sounds: Normal heart sounds. No murmur. No friction rub. No gallop.   Pulmonary:     Effort: Pulmonary effort is normal. No respiratory distress.     Breath sounds: Normal breath sounds.  Abdominal:     General: Bowel sounds are normal. There is no distension.     Palpations: Abdomen is soft.     Tenderness: There  is no abdominal tenderness. There is no guarding.  Musculoskeletal: Normal range of motion.        General: Tenderness present.       Back:     Comments: No midline spinal tenderness present in lumbar, thoracic or cervical spine. No step-off palpated. No visible bruising, edema or temperature change noted. No objective signs of numbness present. No saddle anesthesia. 2+ DP pulses bilaterally. Sensation intact to light touch. Strength 5/5 in bilateral lower extremities.  Skin:    General: Skin is warm and dry.     Findings: No rash.  Neurological:     Mental Status: He is alert.     Motor: No abnormal muscle tone.     Coordination: Coordination normal.      ED Treatments / Results  Labs (all labs ordered are listed, but only abnormal results are displayed) Labs Reviewed  URINALYSIS, ROUTINE W REFLEX MICROSCOPIC - Abnormal; Notable for the following components:      Result Value   Glucose, UA >=500 (*)    All other components within normal limits  URINALYSIS, MICROSCOPIC (REFLEX) - Abnormal; Notable for the following components:   Bacteria, UA FEW (*)    All other components within normal limits    EKG None  Radiology Ct Renal Stone Study  Result Date: 02/09/2019 CLINICAL DATA:  Left low back pain for 2 weeks. EXAM: CT ABDOMEN AND PELVIS WITHOUT CONTRAST TECHNIQUE: Multidetector CT imaging of the abdomen and pelvis was performed following the standard protocol  without IV contrast. COMPARISON:  08/12/2008 FINDINGS: Lower chest: Unremarkable. Hepatobiliary: The liver shows diffusely decreased attenuation suggesting fat deposition. There is no evidence for gallstones, gallbladder wall thickening, or pericholecystic fluid. No intrahepatic or extrahepatic biliary dilation. Pancreas: No focal mass lesion. No dilatation of the main duct. No intraparenchymal cyst. No peripancreatic edema. Spleen: No splenomegaly. No focal mass lesion. Adrenals/Urinary Tract: No adrenal nodule or mass. Right kidney and ureter unremarkable. Clustered stones are a single irregular stone in the lower pole left kidney measures 4 x 11 x 9 mm. 1.7 x 0.7 x 0.8 cm stone is identified in the left renal pelvis with mild left hydronephrosis. No left ureteral stone. No bladder stone. Stomach/Bowel: Stomach is unremarkable. No gastric wall thickening. No evidence of outlet obstruction. Duodenum is normally positioned as is the ligament of Treitz. No small bowel wall thickening. No small bowel dilatation. The terminal ileum is normal. The appendix is normal. No gross colonic mass. No colonic wall thickening. Vascular/Lymphatic: There is abdominal aortic atherosclerosis without aneurysm. There is no gastrohepatic or hepatoduodenal ligament lymphadenopathy. No intraperitoneal or retroperitoneal lymphadenopathy. No pelvic sidewall lymphadenopathy. Reproductive: The prostate gland and seminal vesicles are unremarkable. Other: No intraperitoneal free fluid. Musculoskeletal: Small left groin hernia contains only fat. No worrisome lytic or sclerotic osseous abnormality. IMPRESSION: 1. 1.7 x 0.7 x 0.8 cm stone in the left renal pelvis with mild left hydronephrosis. 2. Clustered stones versus single irregular stone in the lower pole left kidney. 3. Hepatic steatosis. 4.  Aortic Atherosclerois (ICD10-170.0) Electronically Signed   By: Kennith CenterEric  Mansell M.D.   On: 02/09/2019 16:59    Procedures Procedures (including  critical care time)  Medications Ordered in ED Medications  oxyCODONE-acetaminophen (PERCOCET/ROXICET) 5-325 MG per tablet 1 tablet (1 tablet Oral Given 02/09/19 1624)     Initial Impression / Assessment and Plan / ED Course  I have reviewed the triage vital signs and the nursing notes.  Pertinent labs & imaging results that were available  during my care of the patient were reviewed by me and considered in my medical decision making (see chart for details).        58 year old male presents to ED for left-sided back pain concern for kidney stone.  Symptoms have been going on for 2 weeks.  Describes the pain is aching, worse with movement, palpation.  No improvement with extra strength Tylenol.  States that this could feel similar to kidney stone that he had about 5 years ago which passed on its own.  Denies abdominal pain.  On my exam there is tenderness palpation the left flank without rebound or guarding.  His vital signs are within normal limits.  Urinalysis without evidence of infection with the exception of free bacteria.  CT renal stone study shows a 1.7 cm stone in left renal pelvis without evidence of obstructing stone or other cause of symptoms.  Suspect the symptoms are musculoskeletal in nature. Patient denies any concerning symptoms suggestive of cauda equina requiring urgent imaging at this time such as loss of sensation in the lower extremities, lower extremity weakness, loss of bowel or bladder control, saddle anesthesia, urinary retention, fever/chills, IVDU. Exam demonstrated no  weakness on exam today. No preceding injury or trauma to suggest acute fracture. Doubt AAA as cause of patient's back pain as patient lacks major risk factors, had no abdominal TTP, and has symmetric and intact distal pulses. Patient given strict return precautions for any symptoms indicating worsening neurologic function in the lower extremities.  Patient is hemodynamically stable, in NAD, and able to  ambulate in the ED. Evaluation does not show pathology that would require ongoing emergent intervention or inpatient treatment. I explained the diagnosis to the patient. Pain has been managed and has no complaints prior to discharge. Patient is comfortable with above plan and is stable for discharge at this time. All questions were answered prior to disposition. Strict return precautions for returning to the ED were discussed. Encouraged follow up with PCP.   An After Visit Summary was printed and given to the patient.   Portions of this note were generated with Scientist, clinical (histocompatibility and immunogenetics)Dragon dictation software. Dictation errors may occur despite best attempts at proofreading.   Final Clinical Impressions(s) / ED Diagnoses   Final diagnoses:  Strain of lumbar region, initial encounter    ED Discharge Orders         Ordered    methocarbamol (ROBAXIN) 500 MG tablet  2 times daily     02/09/19 1726    lidocaine (LIDODERM) 5 %  Every 24 hours     02/09/19 1726           Dietrich PatesKhatri, Terre Zabriskie, PA-C 02/09/19 1728    Melene PlanFloyd, Dan, DO 02/09/19 1858

## 2019-02-09 NOTE — ED Notes (Signed)
Patient transported to CT 

## 2019-02-09 NOTE — ED Triage Notes (Signed)
Pt reports left lower back pain x 2 weeks. Denies falls, denies known Injury but states he does heavy lifting at work

## 2019-02-09 NOTE — Discharge Instructions (Signed)
Take medications to help with your symptoms. Return to the ED if you start to have worsening symptoms, loss of control of your bowels or bladder, develop a fever, chest pain, numbness in arms or legs.

## 2019-02-14 ENCOUNTER — Other Ambulatory Visit: Payer: Self-pay

## 2019-02-14 ENCOUNTER — Ambulatory Visit (INDEPENDENT_AMBULATORY_CARE_PROVIDER_SITE_OTHER): Payer: PRIVATE HEALTH INSURANCE | Admitting: Family Medicine

## 2019-02-14 ENCOUNTER — Encounter: Payer: Self-pay | Admitting: Family Medicine

## 2019-02-14 VITALS — BP 140/80 | HR 74 | Temp 97.0°F | Resp 18 | Ht 70.0 in | Wt 254.0 lb

## 2019-02-14 DIAGNOSIS — M5441 Lumbago with sciatica, right side: Secondary | ICD-10-CM | POA: Insufficient documentation

## 2019-02-14 DIAGNOSIS — N200 Calculus of kidney: Secondary | ICD-10-CM | POA: Insufficient documentation

## 2019-02-14 HISTORY — DX: Lumbago with sciatica, right side: M54.41

## 2019-02-14 MED ORDER — METHOCARBAMOL 500 MG PO TABS: 500.0000 mg | ORAL_TABLET | Freq: Two times a day (BID) | ORAL | 0 refills | Status: DC | PRN

## 2019-02-14 NOTE — Assessment & Plan Note (Signed)
Ct reviewed --- stone was large but nonobstructive Will refer to urology for eval  Drink more water ---- less alcohol

## 2019-02-14 NOTE — Progress Notes (Signed)
Patient ID: Dennis Hopkins, male    DOB: 06-06-1961  Age: 58 y.o. MRN: [id number]    Subjective:  Subjective  HPI Dennis Hopkins presents for f/u from ER for L flank pain and kidney stone.  Er did not feel his pain was from the kidney stone---it was non obstructive  Pain sometimes radiates down L leg   Review of Systems  Constitutional: Negative.   HENT: Negative for congestion, ear pain, hearing loss, nosebleeds, postnasal drip, rhinorrhea, sinus pressure, sneezing and tinnitus.   Eyes: Negative for photophobia, discharge, itching and visual disturbance.  Respiratory: Negative.   Cardiovascular: Negative.   Gastrointestinal: Negative for abdominal distention, abdominal pain, anal bleeding, blood in stool and constipation.  Endocrine: Negative.   Genitourinary: Positive for flank pain.  Skin: Negative.   Allergic/Immunologic: Negative.   Neurological: Negative for dizziness, weakness, light-headedness, numbness and headaches.  Psychiatric/Behavioral: Negative for agitation, confusion, decreased concentration, dysphoric mood, sleep disturbance and suicidal ideas. The patient is not nervous/anxious.     History Past Medical History:  Diagnosis Date   Chickenpox    Hyperlipemia    Hypertension    Kidney stones 2013    He has a past surgical history that includes Tonsillectomy; Cardiac catheterization (N/A, 01/07/2016); Coronary artery bypass graft (N/A, 01/13/2016); and TEE without cardioversion (N/A, 01/13/2016).   His family history includes Arthritis in his father and mother; Diabetes in his brother, father, and mother; Hyperlipidemia in his father.He reports that he quit  smoking about 5 years ago. His smoking use included cigarettes. He has a 70.00 pack-year smoking history. He has never used smokeless tobacco. He reports current alcohol use. He reports that he does not use drugs.  Current Outpatient Medications on File Prior to Visit  Medication Sig Dispense Refill   atorvastatin (LIPITOR) 80 MG tablet Take 1 tablet (80 mg total) by mouth daily. 90 tablet 1   cyclobenzaprine (FLEXERIL) 10 MG tablet Take 1 tablet (10 mg total) by mouth 3 (three) times daily as needed for muscle spasms. 30 tablet 0   FARXIGA 5 MG TABS tablet TAKE 1 TABLET BY MOUTH ONCE DAILY FOR 30 DAYS     glipiZIDE (GLUCOTROL) 5 MG tablet Take 1 tablet (5 mg total) by mouth 2 (two) times daily before a meal. 60 tablet 3   lidocaine (LIDODERM) 5 % Place 1 patch onto the skin daily. Remove & Discard patch within 12 hours or as directed by MD 30 patch 0   lisinopril (ZESTRIL) 5 MG tablet Take 1 tablet (5 mg total) by mouth daily. 30 tablet 3   metoprolol tartrate (  LOPRESSOR) 100 MG tablet Take 1 tablet by mouth twice daily 180 tablet 0   sitaGLIPtin (JANUVIA) 50 MG tablet Take 1 tablet (50 mg total) by mouth daily. 90 tablet 3   clopidogrel (PLAVIX) 75 MG tablet Take 1 tablet (75 mg total) by mouth daily. (Patient not taking: Reported on 11/08/2018) 90 tablet 3   fenofibrate 160 MG tablet Take 1 tablet (160 mg total) by mouth daily. (Patient not taking: Reported on 08/06/2018) 30 tablet 2   No current facility-administered medications on file prior to visit.      Objective:  Objective  Physical Exam Vitals signs and nursing note reviewed.  Constitutional:      General: He is sleeping.     Appearance: He is well-developed.  HENT:     Head: Normocephalic and atraumatic.  Eyes:     Pupils: Pupils are equal, round, and reactive to light.  Neck:     Musculoskeletal: Normal range of motion and neck supple.     Thyroid: No thyromegaly.  Cardiovascular:     Rate and Rhythm: Normal rate  and regular rhythm.     Heart sounds: No murmur.  Pulmonary:     Effort: Pulmonary effort is normal. No respiratory distress.     Breath sounds: Normal breath sounds. No wheezing or rales.  Chest:     Chest wall: No tenderness.  Abdominal:     General: There is no distension.     Palpations: Abdomen is soft.     Tenderness: There is no abdominal tenderness. There is no right CVA tenderness, left CVA tenderness, guarding or rebound.  Musculoskeletal:        General: No tenderness.  Skin:    General: Skin is warm and dry.  Neurological:     General: No focal deficit present.     Mental Status: He is oriented to person, place, and time.     Motor: No weakness.     Coordination: Coordination normal.     Gait: Gait normal.     Deep Tendon Reflexes: Reflexes normal.  Psychiatric:        Behavior: Behavior normal.        Thought Content: Thought content normal.        Judgment: Judgment normal.    BP 140/80 (BP Location: Right Arm, Patient Position: Sitting, Cuff Size: Normal)    Pulse 74    Temp (!) 97 F (36.1 C) (Temporal)    Resp 18    Ht 5\' 10"  (1.778 m)    Wt 254 lb (115.2 kg)    SpO2 98%    BMI 36.45 kg/m  Wt Readings from Last 3 Encounters:  02/14/19 254 lb (115.2 kg)  02/09/19 260 lb (117.9 kg)  08/06/18 258 lb 3.2 oz (117.1 kg)     Lab Results  Component Value Date   WBC 11.2 (H) 01/16/2016   HGB 10.2 (L) 01/16/2016   HCT 31.8 (L) 01/16/2016   PLT 189 01/16/2016   GLUCOSE 332 (H) 10/22/2018   CHOL 207 (H) 10/22/2018   TRIG 238.0 (H) 10/22/2018   HDL 40.20 10/22/2018   LDLDIRECT 143.0 10/22/2018   LDLCALC UNABLE TO CALCULATE IF TRIGLYCERIDE OVER 400 mg/dL 95/12/2255   ALT 28 50/51/8335   AST 28 10/22/2018   NA 134 (L) 10/22/2018   K 4.0 10/22/2018   CL 96 10/22/2018   CREATININE 1.37 10/22/2018   BUN 21 10/22/2018   CO2 24 10/22/2018   TSH 1.54 09/29/2014   INR 1.32  01/13/2016   HGBA1C 12.1 (H) 10/22/2018   MICROALBUR 5.3 (H) 08/06/2018    Ct Renal  Stone Study  Result Date: 02/09/2019 CLINICAL DATA:  Left low back pain for 2 weeks. EXAM: CT ABDOMEN AND PELVIS WITHOUT CONTRAST TECHNIQUE: Multidetector CT imaging of the abdomen and pelvis was performed following the standard protocol without IV contrast. COMPARISON:  08/12/2008 FINDINGS: Lower chest: Unremarkable. Hepatobiliary: The liver shows diffusely decreased attenuation suggesting fat deposition. There is no evidence for gallstones, gallbladder wall thickening, or pericholecystic fluid. No intrahepatic or extrahepatic biliary dilation. Pancreas: No focal mass lesion. No dilatation of the main duct. No intraparenchymal cyst. No peripancreatic edema. Spleen: No splenomegaly. No focal mass lesion. Adrenals/Urinary Tract: No adrenal nodule or mass. Right kidney and ureter unremarkable. Clustered stones are a single irregular stone in the lower pole left kidney measures 4 x 11 x 9 mm. 1.7 x 0.7 x 0.8 cm stone is identified in the left renal pelvis with mild left hydronephrosis. No left ureteral stone. No bladder stone. Stomach/Bowel: Stomach is unremarkable. No gastric wall thickening. No evidence of outlet obstruction. Duodenum is normally positioned as is the ligament of Treitz. No small bowel wall thickening. No small bowel dilatation. The terminal ileum is normal. The appendix is normal. No gross colonic mass. No colonic wall thickening. Vascular/Lymphatic: There is abdominal aortic atherosclerosis without aneurysm. There is no gastrohepatic or hepatoduodenal ligament lymphadenopathy. No intraperitoneal or retroperitoneal lymphadenopathy. No pelvic sidewall lymphadenopathy. Reproductive: The prostate gland and seminal vesicles are unremarkable. Other: No intraperitoneal free fluid. Musculoskeletal: Small left groin hernia contains only fat. No worrisome lytic or sclerotic osseous abnormality. IMPRESSION: 1. 1.7 x 0.7 x 0.8 cm stone in the left renal pelvis with mild left hydronephrosis. 2. Clustered  stones versus single irregular stone in the lower pole left kidney. 3. Hepatic steatosis. 4.  Aortic Atherosclerois (ICD10-170.0) Electronically Signed   By: Misty Stanley M.D.   On: 02/09/2019 16:59     Assessment & Plan:  Plan  I have changed Malikye W. Soliz's methocarbamol. I am also having him maintain his clopidogrel, atorvastatin, fenofibrate, sitaGLIPtin, glipiZIDE, lisinopril, cyclobenzaprine, metoprolol tartrate, Farxiga, and lidocaine.  Meds ordered this encounter  Medications   methocarbamol (ROBAXIN) 500 MG tablet    Sig: Take 1 tablet (500 mg total) by mouth 2 (two) times daily as needed for muscle spasms.    Dispense:  20 tablet    Refill:  0    Problem List Items Addressed This Visit    None    Visit Diagnoses    Kidney stones    -  Primary   Relevant Orders   Ambulatory referral to Urology   Acute left-sided low back pain with right-sided sciatica       Relevant Medications   methocarbamol (ROBAXIN) 500 MG tablet   Other Relevant Orders   DG Lumbar Spine Complete      Follow-up: Return if symptoms worsen or fail to improve.  [name], DO

## 2019-02-14 NOTE — Assessment & Plan Note (Signed)
Refill muscle relaxer  Heat/ ice Check xray

## 2019-02-14 NOTE — Patient Instructions (Signed)
Acute Back Pain, Adult Acute back pain is sudden and usually short-lived. It is often caused by an injury to the muscles and tissues in the back. The injury may result from:  A muscle or ligament getting overstretched or torn (strained). Ligaments are tissues that connect bones to each other. Lifting something improperly can cause a back strain.  Wear and tear (degeneration) of the spinal disks. Spinal disks are circular tissue that provides cushioning between the bones of the spine (vertebrae).  Twisting motions, such as while playing sports or doing yard work.  A hit to the back.  Arthritis. You may have a physical exam, lab tests, and imaging tests to find the cause of your pain. Acute back pain usually goes away with rest and home care. Follow these instructions at home: Managing pain, stiffness, and swelling  Take over-the-counter and prescription medicines only as told by your health care provider.  Your health care provider may recommend applying ice during the first 24-48 hours after your pain starts. To do this: ? Put ice in a plastic bag. ? Place a towel between your skin and the bag. ? Leave the ice on for 20 minutes, 2-3 times a day.  If directed, apply heat to the affected area as often as told by your health care provider. Use the heat source that your health care provider recommends, such as a moist heat pack or a heating pad. ? Place a towel between your skin and the heat source. ? Leave the heat on for 20-30 minutes. ? Remove the heat if your skin turns bright red. This is especially important if you are unable to feel pain, heat, or cold. You have a greater risk of getting burned. Activity   Do not stay in bed. Staying in bed for more than 1-2 days can delay your recovery.  Sit up and stand up straight. Avoid leaning forward when you sit, or hunching over when you stand. ? If you work at a desk, sit close to it so you do not need to lean over. Keep your chin tucked  in. Keep your neck drawn back, and keep your elbows bent at a right angle. Your arms should look like the letter "L." ? Sit high and close to the steering wheel when you drive. Add lower back (lumbar) support to your car seat, if needed.  Take short walks on even surfaces as soon as you are able. Try to increase the length of time you walk each day.  Do not sit, drive, or stand in one place for more than 30 minutes at a time. Sitting or standing for long periods of time can put stress on your back.  Do not drive or use heavy machinery while taking prescription pain medicine.  Use proper lifting techniques. When you bend and lift, use positions that put less stress on your back: ? Bend your knees. ? Keep the load close to your body. ? Avoid twisting.  Exercise regularly as told by your health care provider. Exercising helps your back heal faster and helps prevent back injuries by keeping muscles strong and flexible.  Work with a physical therapist to make a safe exercise program, as recommended by your health care provider. Do any exercises as told by your physical therapist. Lifestyle  Maintain a healthy weight. Extra weight puts stress on your back and makes it difficult to have good posture.  Avoid activities or situations that make you feel anxious or stressed. Stress and anxiety increase muscle   tension and can make back pain worse. Learn ways to manage anxiety and stress, such as through exercise. General instructions  Sleep on a firm mattress in a comfortable position. Try lying on your side with your knees slightly bent. If you lie on your back, put a pillow under your knees.  Follow your treatment plan as told by your health care provider. This may include: ? Cognitive or behavioral therapy. ? Acupuncture or massage therapy. ? Meditation or yoga. Contact a health care provider if:  You have pain that is not relieved with rest or medicine.  You have increasing pain going down  into your legs or buttocks.  Your pain does not improve after 2 weeks.  You have pain at night.  You lose weight without trying.  You have a fever or chills. Get help right away if:  You develop new bowel or bladder control problems.  You have unusual weakness or numbness in your arms or legs.  You develop nausea or vomiting.  You develop abdominal pain.  You feel faint. Summary  Acute back pain is sudden and usually short-lived.  Use proper lifting techniques. When you bend and lift, use positions that put less stress on your back.  Take over-the-counter and prescription medicines and apply heat or ice as directed by your health care provider. This information is not intended to replace advice given to you by your health care provider. Make sure you discuss any questions you have with your health care provider. Document Released: 06/06/2005 Document Revised: 09/25/2018 Document Reviewed: 01/18/2017 Elsevier Patient Education  2020 Elsevier Inc.  

## 2019-09-02 ENCOUNTER — Other Ambulatory Visit: Payer: Self-pay | Admitting: Family Medicine

## 2019-09-02 DIAGNOSIS — I10 Essential (primary) hypertension: Secondary | ICD-10-CM

## 2019-09-02 DIAGNOSIS — M5441 Lumbago with sciatica, right side: Secondary | ICD-10-CM

## 2019-09-03 NOTE — Telephone Encounter (Signed)
Pt is overdue for follow up. Mailed letter to call for appointment.

## 2019-11-19 ENCOUNTER — Encounter: Payer: Self-pay | Admitting: Family Medicine

## 2019-11-19 ENCOUNTER — Other Ambulatory Visit: Payer: Self-pay

## 2019-11-19 ENCOUNTER — Ambulatory Visit (INDEPENDENT_AMBULATORY_CARE_PROVIDER_SITE_OTHER): Payer: PRIVATE HEALTH INSURANCE | Admitting: Family Medicine

## 2019-11-19 VITALS — BP 128/78 | HR 75 | Temp 97.0°F | Resp 18 | Ht 70.0 in | Wt 258.8 lb

## 2019-11-19 DIAGNOSIS — I25118 Atherosclerotic heart disease of native coronary artery with other forms of angina pectoris: Secondary | ICD-10-CM

## 2019-11-19 DIAGNOSIS — E785 Hyperlipidemia, unspecified: Secondary | ICD-10-CM

## 2019-11-19 DIAGNOSIS — E1165 Type 2 diabetes mellitus with hyperglycemia: Secondary | ICD-10-CM

## 2019-11-19 DIAGNOSIS — E1169 Type 2 diabetes mellitus with other specified complication: Secondary | ICD-10-CM | POA: Diagnosis not present

## 2019-11-19 DIAGNOSIS — M5441 Lumbago with sciatica, right side: Secondary | ICD-10-CM | POA: Diagnosis not present

## 2019-11-19 DIAGNOSIS — E1151 Type 2 diabetes mellitus with diabetic peripheral angiopathy without gangrene: Secondary | ICD-10-CM

## 2019-11-19 DIAGNOSIS — IMO0002 Reserved for concepts with insufficient information to code with codable children: Secondary | ICD-10-CM

## 2019-11-19 DIAGNOSIS — I10 Essential (primary) hypertension: Secondary | ICD-10-CM | POA: Diagnosis not present

## 2019-11-19 MED ORDER — METHOCARBAMOL 500 MG PO TABS: ORAL_TABLET | ORAL | 1 refills | Status: DC

## 2019-11-19 MED ORDER — SITAGLIPTIN PHOSPHATE 100 MG PO TABS: 100.0000 mg | ORAL_TABLET | Freq: Every day | ORAL | 2 refills | Status: DC

## 2019-11-19 MED ORDER — METOPROLOL TARTRATE 100 MG PO TABS: 100.0000 mg | ORAL_TABLET | Freq: Two times a day (BID) | ORAL | 3 refills | Status: DC

## 2019-11-19 MED ORDER — GLIPIZIDE 5 MG PO TABS: 5.0000 mg | ORAL_TABLET | Freq: Two times a day (BID) | ORAL | 3 refills | Status: DC

## 2019-11-19 NOTE — Progress Notes (Signed)
Patient ID: Dennis Hopkins, male    DOB: 11-14-1960  Age: 59 y.o. MRN: [id number]    Subjective:  Subjective  HPI Dennis Hopkins presents for dm, htn and cholesterol    Pt had a health fair at work and his bp was high and bs was high as well.  He ran out of metoprolol and never came back in.  HYPERTENSION   Blood pressure range-not checking --- ran high at work   Chest pain- no      Dyspnea- no Lightheadedness- no   Edema- no  Other side effects - no   Medication compliance: good Low salt diet- yes    DIABETES    Blood Sugar ranges-running high  Polyuria- mo New Visual problems- no  Hypoglycemic symptoms- no  Other side effects-no Medication compliance - good Last eye exam- due  Foot exam- today   HYPERLIPIDEMIA  Medication compliance- good RUQ pain- no  Muscle aches- no Other side effects-no      Review of Systems  Constitutional: Negative for appetite change, diaphoresis, fatigue and unexpected weight change.  Eyes: Negative for pain, redness and visual disturbance.  Respiratory: Negative for cough, chest tightness, shortness of breath and wheezing.   Cardiovascular: Negative for chest pain, palpitations and leg swelling.  Endocrine: Negative for cold intolerance, heat intolerance, polydipsia, polyphagia and polyuria.  Genitourinary: Negative for difficulty urinating, dysuria and frequency.  Neurological: Negative for dizziness, light-headedness, numbness and headaches.    History Past Medical History:  Diagnosis Date  . Chickenpox   . Hyperlipemia   . Hypertension   . Kidney stones 2013    He has a past surgical history that includes Tonsillectomy; Cardiac catheterization (N/A, 01/07/2016); Coronary artery bypass graft (N/A, 01/13/2016); and TEE without cardioversion (N/A, 01/13/2016).   His family history includes Arthritis in his father and mother; Diabetes in his brother, father, and mother; Hyperlipidemia in his father.He reports that he quit smoking  about 5 years ago. His smoking use included cigarettes. He has a 70.00 pack-year smoking history. He has never used smokeless tobacco. He reports current alcohol use. He reports that he does not use drugs.  Current Outpatient Medications on File Prior to Visit  Medication Sig Dispense Refill  . atorvastatin (LIPITOR) 80 MG tablet Take 1 tablet (80 mg total) by mouth daily. 90 tablet 1  . cyclobenzaprine (FLEXERIL) 10 MG tablet Take 1 tablet (10 mg total) by mouth 3 (three) times daily as needed for muscle spasms. 30 tablet 0  . FARXIGA 5 MG TABS tablet TAKE 1 TABLET BY MOUTH ONCE DAILY FOR 30 DAYS    . lidocaine (LIDODERM) 5 % Place 1 patch onto the skin daily. Remove & Discard patch within 12 hours or as directed by MD 30 patch 0  . lisinopril (ZESTRIL) 5 MG tablet Take 1 tablet (5 mg total) by mouth daily. 30 tablet 3  . fenofibrate 160 MG tablet Take 1 tablet (160 mg total) by mouth daily. (Patient not taking: Reported on 08/06/2018) 30 tablet 2   No current facility-administered medications on file prior to visit.     Objective:  Objective  Physical Exam Vitals and nursing note reviewed.  Constitutional:      General: He is sleeping.     Appearance: He is well-developed.  HENT:     Head: Normocephalic and atraumatic.  Eyes:     Pupils: Pupils are equal, round, and reactive to light.  Neck:     Thyroid: No thyromegaly.  Cardiovascular:  Rate and Rhythm: Normal rate and regular rhythm.     Heart sounds: No murmur.  Pulmonary:     Effort: Pulmonary effort is normal. No respiratory distress.     Breath sounds: Normal breath sounds. No wheezing or rales.  Chest:     Chest wall: No tenderness.  Musculoskeletal:        General: No tenderness.     Cervical back: Normal range of motion and neck supple.  Skin:    General: Skin is warm and dry.  Neurological:     Mental Status: He is oriented to person, place, and time.  Psychiatric:        Behavior: Behavior normal.         Thought Content: Thought content normal.        Judgment: Judgment normal.    Diabetic Foot Exam - Simple   Simple Foot Form Diabetic Foot exam was performed with the following findings: Yes 11/19/2019  3:13 PM  Visual Inspection No deformities, no ulcerations, no other skin breakdown bilaterally: Yes Sensation Testing Intact to touch and monofilament testing bilaterally: Yes Pulse Check Posterior Tibialis and Dorsalis pulse intact bilaterally: Yes Comments He can feel monofilament but c/o middle toe both feet numb     BP 128/78 (BP Location: Right Arm, Patient Position: Sitting, Cuff Size: Large)   Pulse 75   Temp (!) 97 F (36.1 C) (Temporal)   Resp 18   Ht 5\' 10"  (1.778 m)   Wt 258 lb 12.8 oz (117.4 kg)   SpO2 97%   BMI 37.13 kg/m  Wt Readings from Last 3 Encounters:  11/19/19 258 lb 12.8 oz (117.4 kg)  02/14/19 254 lb (115.2 kg)  02/09/19 260 lb (117.9 kg)     Lab Results  Component Value Date   WBC 11.2 (H) 01/16/2016   HGB 10.2 (L) 01/16/2016   HCT 31.8 (L) 01/16/2016   PLT 189 01/16/2016   GLUCOSE 332 (H) 10/22/2018   CHOL 207 (H) 10/22/2018   TRIG 238.0 (H) 10/22/2018   HDL 40.20 10/22/2018   LDLDIRECT 143.0 10/22/2018   LDLCALC UNABLE TO CALCULATE IF TRIGLYCERIDE OVER 400 mg/dL 01/08/2016   ALT 28 10/22/2018   AST 28 10/22/2018   NA 134 (L) 10/22/2018   K 4.0 10/22/2018   CL 96 10/22/2018   CREATININE 1.37 10/22/2018   BUN 21 10/22/2018   CO2 24 10/22/2018   TSH 1.54 09/29/2014   INR 1.32 01/13/2016   HGBA1C 12.1 (H) 10/22/2018   MICROALBUR 5.3 (H) 08/06/2018    CT Renal Stone Study  Result Date: 02/09/2019 CLINICAL DATA:  Left low back pain for 2 weeks. EXAM: CT ABDOMEN AND PELVIS WITHOUT CONTRAST TECHNIQUE: Multidetector CT imaging of the abdomen and pelvis was performed following the standard protocol without IV contrast. COMPARISON:  08/12/2008 FINDINGS: Lower chest: Unremarkable. Hepatobiliary: The liver shows diffusely decreased attenuation  suggesting fat deposition. There is no evidence for gallstones, gallbladder wall thickening, or pericholecystic fluid. No intrahepatic or extrahepatic biliary dilation. Pancreas: No focal mass lesion. No dilatation of the main duct. No intraparenchymal cyst. No peripancreatic edema. Spleen: No splenomegaly. No focal mass lesion. Adrenals/Urinary Tract: No adrenal nodule or mass. Right kidney and ureter unremarkable. Clustered stones are a single irregular stone in the lower pole left kidney measures 4 x 11 x 9 mm. 1.7 x 0.7 x 0.8 cm stone is identified in the left renal pelvis with mild left hydronephrosis. No left ureteral stone. No bladder stone. Stomach/Bowel: Stomach is unremarkable.  No gastric wall thickening. No evidence of outlet obstruction. Duodenum is normally positioned as is the ligament of Treitz. No small bowel wall thickening. No small bowel dilatation. The terminal ileum is normal. The appendix is normal. No gross colonic mass. No colonic wall thickening. Vascular/Lymphatic: There is abdominal aortic atherosclerosis without aneurysm. There is no gastrohepatic or hepatoduodenal ligament lymphadenopathy. No intraperitoneal or retroperitoneal lymphadenopathy. No pelvic sidewall lymphadenopathy. Reproductive: The prostate gland and seminal vesicles are unremarkable. Other: No intraperitoneal free fluid. Musculoskeletal: Small left groin hernia contains only fat. No worrisome lytic or sclerotic osseous abnormality. IMPRESSION: 1. 1.7 x 0.7 x 0.8 cm stone in the left renal pelvis with mild left hydronephrosis. 2. Clustered stones versus single irregular stone in the lower pole left kidney. 3. Hepatic steatosis. 4.  Aortic Atherosclerois (ICD10-170.0) Electronically Signed   By: Kennith Center M.D.   On: 02/09/2019 16:59     Assessment & Plan:  Plan  I have discontinued Aycen W. Merlin's clopidogrel and sitaGLIPtin. I have also changed his metoprolol tartrate. Additionally, I am having him start on  sitaGLIPtin. Lastly, I am having him maintain his atorvastatin, fenofibrate, lisinopril, cyclobenzaprine, Farxiga, lidocaine, glipiZIDE, and methocarbamol.  Meds ordered this encounter  Medications  . metoprolol tartrate (LOPRESSOR) 100 MG tablet    Sig: Take 1 tablet (100 mg total) by mouth 2 (two) times daily.    Dispense:  60 tablet    Refill:  3  . glipiZIDE (GLUCOTROL) 5 MG tablet    Sig: Take 1 tablet (5 mg total) by mouth 2 (two) times daily before a meal.    Dispense:  60 tablet    Refill:  3  . sitaGLIPtin (JANUVIA) 100 MG tablet    Sig: Take 1 tablet (100 mg total) by mouth daily.    Dispense:  30 tablet    Refill:  2  . methocarbamol (ROBAXIN) 500 MG tablet    Sig: TAKE 1 TABLET BY MOUTH TWICE DAILY AS NEEDED FOR MUSCLE SPASM    Dispense:  45 tablet    Refill:  1    Will need ov for more refills    Problem List Items Addressed This Visit      Unprioritized   Acute left-sided low back pain with right-sided sciatica   Relevant Medications   methocarbamol (ROBAXIN) 500 MG tablet   Coronary artery disease    Cont meds Check labs       Relevant Medications   metoprolol tartrate (LOPRESSOR) 100 MG tablet   DM (diabetes mellitus) type II uncontrolled, periph vascular disorder (HCC)    hgba1c not acceptable, minimize simple carbs. Increase exercise as tolerated. Continue current meds       Relevant Medications   metoprolol tartrate (LOPRESSOR) 100 MG tablet   glipiZIDE (GLUCOTROL) 5 MG tablet   sitaGLIPtin (JANUVIA) 100 MG tablet   Other Relevant Orders   Ambulatory referral to Endocrinology   Hemoglobin A1c   Comprehensive metabolic panel   Microalbumin / creatinine urine ratio   HTN (hypertension)    Poorly controlled will alter medications, encouraged DASH diet, minimize caffeine and obtain adequate sleep. Report concerning symptoms and follow up as directed and as needed Refill metoprolol      Relevant Medications   metoprolol tartrate (LOPRESSOR) 100 MG  tablet   Other Relevant Orders   Lipid panel   Microalbumin / creatinine urine ratio   Hyperlipidemia LDL goal <70    Tolerating statin, encouraged heart healthy diet, avoid trans fats, minimize simple carbs  and saturated fats. Increase exercise as tolerated      Relevant Medications   metoprolol tartrate (LOPRESSOR) 100 MG tablet    Other Visit Diagnoses    Hyperlipidemia associated with type 2 diabetes mellitus (HCC)    -  Primary   Relevant Medications   glipiZIDE (GLUCOTROL) 5 MG tablet   sitaGLIPtin (JANUVIA) 100 MG tablet      Follow-up: Return in about 2 weeks (around 12/03/2019), or if symptoms worsen or fail to improve, for hypertension--- also make endo app with Dr Lonzo Cloud .  Donato Schultz, DO

## 2019-11-19 NOTE — Patient Instructions (Signed)
DASH Eating Plan DASH stands for "Dietary Approaches to Stop Hypertension." The DASH eating plan is a healthy eating plan that has been shown to reduce high blood pressure (hypertension). It may also reduce your risk for type 2 diabetes, heart disease, and stroke. The DASH eating plan may also help with weight loss. What are tips for following this plan?  General guidelines  Avoid eating more than 2,300 mg (milligrams) of salt (sodium) a day. If you have hypertension, you may need to reduce your sodium intake to 1,500 mg a day.  Limit alcohol intake to no more than 1 drink a day for nonpregnant women and 2 drinks a day for men. One drink equals 12 oz of beer, 5 oz of wine, or 1 oz of hard liquor.  Work with your health care provider to maintain a healthy body weight or to lose weight. Ask what an ideal weight is for you.  Get at least 30 minutes of exercise that causes your heart to beat faster (aerobic exercise) most days of the week. Activities may include walking, swimming, or biking.  Work with your health care provider or diet and nutrition specialist (dietitian) to adjust your eating plan to your individual calorie needs. Reading food labels   Check food labels for the amount of sodium per serving. Choose foods with less than 5 percent of the Daily Value of sodium. Generally, foods with less than 300 mg of sodium per serving fit into this eating plan.  To find whole grains, look for the word "whole" as the first word in the ingredient list. Shopping  Buy products labeled as "low-sodium" or "no salt added."  Buy fresh foods. Avoid canned foods and premade or frozen meals. Cooking  Avoid adding salt when cooking. Use salt-free seasonings or herbs instead of table salt or sea salt. Check with your health care provider or pharmacist before using salt substitutes.  Do not fry foods. Cook foods using healthy methods such as baking, boiling, grilling, and broiling instead.  Cook with  heart-healthy oils, such as olive, canola, soybean, or sunflower oil. Meal planning  Eat a balanced diet that includes: ? 5 or more servings of fruits and vegetables each day. At each meal, try to fill half of your plate with fruits and vegetables. ? Up to 6-8 servings of whole grains each day. ? Less than 6 oz of lean meat, poultry, or fish each day. A 3-oz serving of meat is about the same size as a deck of cards. One egg equals 1 oz. ? 2 servings of low-fat dairy each day. ? A serving of nuts, seeds, or beans 5 times each week. ? Heart-healthy fats. Healthy fats called Omega-3 fatty acids are found in foods such as flaxseeds and coldwater fish, like sardines, salmon, and mackerel.  Limit how much you eat of the following: ? Canned or prepackaged foods. ? Food that is high in trans fat, such as fried foods. ? Food that is high in saturated fat, such as fatty meat. ? Sweets, desserts, sugary drinks, and other foods with added sugar. ? Full-fat dairy products.  Do not salt foods before eating.  Try to eat at least 2 vegetarian meals each week.  Eat more home-cooked food and less restaurant, buffet, and fast food.  When eating at a restaurant, ask that your food be prepared with less salt or no salt, if possible. What foods are recommended? The items listed may not be a complete list. Talk with your dietitian about   what dietary choices are best for you. Grains Whole-grain or whole-wheat bread. Whole-grain or whole-wheat pasta. Brown rice. Oatmeal. Quinoa. Bulgur. Whole-grain and low-sodium cereals. Pita bread. Low-fat, low-sodium crackers. Whole-wheat flour tortillas. Vegetables Fresh or frozen vegetables (raw, steamed, roasted, or grilled). Low-sodium or reduced-sodium tomato and vegetable juice. Low-sodium or reduced-sodium tomato sauce and tomato paste. Low-sodium or reduced-sodium canned vegetables. Fruits All fresh, dried, or frozen fruit. Canned fruit in natural juice (without  added sugar). Meat and other protein foods Skinless chicken or turkey. Ground chicken or turkey. Pork with fat trimmed off. Fish and seafood. Egg whites. Dried beans, peas, or lentils. Unsalted nuts, nut butters, and seeds. Unsalted canned beans. Lean cuts of beef with fat trimmed off. Low-sodium, lean deli meat. Dairy Low-fat (1%) or fat-free (skim) milk. Fat-free, low-fat, or reduced-fat cheeses. Nonfat, low-sodium ricotta or cottage cheese. Low-fat or nonfat yogurt. Low-fat, low-sodium cheese. Fats and oils Soft margarine without trans fats. Vegetable oil. Low-fat, reduced-fat, or light mayonnaise and salad dressings (reduced-sodium). Canola, safflower, olive, soybean, and sunflower oils. Avocado. Seasoning and other foods Herbs. Spices. Seasoning mixes without salt. Unsalted popcorn and pretzels. Fat-free sweets. What foods are not recommended? The items listed may not be a complete list. Talk with your dietitian about what dietary choices are best for you. Grains Baked goods made with fat, such as croissants, muffins, or some breads. Dry pasta or rice meal packs. Vegetables Creamed or fried vegetables. Vegetables in a cheese sauce. Regular canned vegetables (not low-sodium or reduced-sodium). Regular canned tomato sauce and paste (not low-sodium or reduced-sodium). Regular tomato and vegetable juice (not low-sodium or reduced-sodium). Pickles. Olives. Fruits Canned fruit in a light or heavy syrup. Fried fruit. Fruit in cream or butter sauce. Meat and other protein foods Fatty cuts of meat. Ribs. Fried meat. Bacon. Sausage. Bologna and other processed lunch meats. Salami. Fatback. Hotdogs. Bratwurst. Salted nuts and seeds. Canned beans with added salt. Canned or smoked fish. Whole eggs or egg yolks. Chicken or turkey with skin. Dairy Whole or 2% milk, cream, and half-and-half. Whole or full-fat cream cheese. Whole-fat or sweetened yogurt. Full-fat cheese. Nondairy creamers. Whipped toppings.  Processed cheese and cheese spreads. Fats and oils Butter. Stick margarine. Lard. Shortening. Ghee. Bacon fat. Tropical oils, such as coconut, palm kernel, or palm oil. Seasoning and other foods Salted popcorn and pretzels. Onion salt, garlic salt, seasoned salt, table salt, and sea salt. Worcestershire sauce. Tartar sauce. Barbecue sauce. Teriyaki sauce. Soy sauce, including reduced-sodium. Steak sauce. Canned and packaged gravies. Fish sauce. Oyster sauce. Cocktail sauce. Horseradish that you find on the shelf. Ketchup. Mustard. Meat flavorings and tenderizers. Bouillon cubes. Hot sauce and Tabasco sauce. Premade or packaged marinades. Premade or packaged taco seasonings. Relishes. Regular salad dressings. Where to find more information:  National Heart, Lung, and Blood Institute: www.nhlbi.nih.gov  American Heart Association: www.heart.org Summary  The DASH eating plan is a healthy eating plan that has been shown to reduce high blood pressure (hypertension). It may also reduce your risk for type 2 diabetes, heart disease, and stroke.  With the DASH eating plan, you should limit salt (sodium) intake to 2,300 mg a day. If you have hypertension, you may need to reduce your sodium intake to 1,500 mg a day.  When on the DASH eating plan, aim to eat more fresh fruits and vegetables, whole grains, lean proteins, low-fat dairy, and heart-healthy fats.  Work with your health care provider or diet and nutrition specialist (dietitian) to adjust your eating plan to your   individual calorie needs. This information is not intended to replace advice given to you by your health care provider. Make sure you discuss any questions you have with your health care provider. Document Revised: 05/19/2017 Document Reviewed: 05/30/2016 Elsevier Patient Education  2020 Elsevier Inc.  

## 2019-11-19 NOTE — Assessment & Plan Note (Signed)
hgba1c not acceptable, minimize simple carbs. Increase exercise as tolerated. Continue current meds

## 2019-11-19 NOTE — Assessment & Plan Note (Signed)
Tolerating statin, encouraged heart healthy diet, avoid trans fats, minimize simple carbs and saturated fats. Increase exercise as tolerated 

## 2019-11-19 NOTE — Assessment & Plan Note (Signed)
Cont meds Check labs 

## 2019-11-19 NOTE — Assessment & Plan Note (Signed)
Poorly controlled will alter medications, encouraged DASH diet, minimize caffeine and obtain adequate sleep. Report concerning symptoms and follow up as directed and as needed Refill metoprolol

## 2019-11-20 LAB — COMPREHENSIVE METABOLIC PANEL
ALT: 36 U/L (ref 0–53)
AST: 28 U/L (ref 0–37)
Albumin: 4 g/dL (ref 3.5–5.2)
Alkaline Phosphatase: 55 U/L (ref 39–117)
BUN: 14 mg/dL (ref 6–23)
CO2: 30 mEq/L (ref 19–32)
Calcium: 9.4 mg/dL (ref 8.4–10.5)
Chloride: 98 mEq/L (ref 96–112)
Creatinine, Ser: 1.08 mg/dL (ref 0.40–1.50)
GFR: 70.06 mL/min (ref >60.00)
Glucose, Bld: 313 mg/dL — ABNORMAL HIGH (ref 70–99)
Potassium: 4 mEq/L (ref 3.5–5.1)
Sodium: 135 mEq/L (ref 135–145)
Total Bilirubin: 0.3 mg/dL (ref 0.2–1.2)
Total Protein: 6.9 g/dL (ref 6.0–8.3)

## 2019-11-20 LAB — LIPID PANEL
Cholesterol: 257 mg/dL — ABNORMAL HIGH (ref 0–200)
HDL: 42.8 mg/dL (ref >39.00)
NonHDL: 213.79
Total CHOL/HDL Ratio: 6
Triglycerides: 275 mg/dL — ABNORMAL HIGH (ref 0.0–149.0)
VLDL: 55 mg/dL — ABNORMAL HIGH (ref 0.0–40.0)

## 2019-11-20 LAB — MICROALBUMIN / CREATININE URINE RATIO
Creatinine,U: 72.2 mg/dL
Microalb Creat Ratio: 6.1 mg/g (ref 0.0–30.0)
Microalb, Ur: 4.4 mg/dL — ABNORMAL HIGH (ref 0.0–1.9)

## 2019-11-20 LAB — LDL CHOLESTEROL, DIRECT: Direct LDL: 173 mg/dL

## 2019-11-20 LAB — HEMOGLOBIN A1C: Hgb A1c MFr Bld: 13.6 % — ABNORMAL HIGH (ref 4.6–6.5)

## 2019-11-27 ENCOUNTER — Encounter: Payer: Self-pay | Admitting: Internal Medicine

## 2019-11-27 ENCOUNTER — Other Ambulatory Visit: Payer: Self-pay

## 2019-11-27 ENCOUNTER — Ambulatory Visit: Payer: PRIVATE HEALTH INSURANCE | Admitting: Internal Medicine

## 2019-11-27 VITALS — BP 128/64 | HR 77 | Ht 70.0 in | Wt 259.6 lb

## 2019-11-27 DIAGNOSIS — E1151 Type 2 diabetes mellitus with diabetic peripheral angiopathy without gangrene: Secondary | ICD-10-CM

## 2019-11-27 DIAGNOSIS — IMO0002 Reserved for concepts with insufficient information to code with codable children: Secondary | ICD-10-CM

## 2019-11-27 DIAGNOSIS — E1165 Type 2 diabetes mellitus with hyperglycemia: Secondary | ICD-10-CM | POA: Diagnosis not present

## 2019-11-27 LAB — GLUCOSE, POCT (MANUAL RESULT ENTRY): POC Glucose: 350 mg/dl — AB (ref 70–99)

## 2019-11-27 MED ORDER — INSULIN LISPRO PROT & LISPRO (75-25 MIX) 100 UNIT/ML KWIKPEN: 24.0000 [IU] | PEN_INJECTOR | Freq: Two times a day (BID) | SUBCUTANEOUS | 6 refills | Status: DC

## 2019-11-27 MED ORDER — INSULIN PEN NEEDLE 32G X 4 MM MISC: 1.0000 | Freq: Two times a day (BID) | 6 refills | Status: DC

## 2019-11-27 NOTE — Patient Instructions (Addendum)
-   STOP Glipizide - STOP Januvia  - Start Humalog MIX 24 units with Breakfast and 24 units with Supper     HOW TO TREAT LOW BLOOD SUGARS (Blood sugar LESS THAN 70 MG/DL)  Please follow the RULE OF 15 for the treatment of hypoglycemia treatment (when your (blood sugars are less than 70 mg/dL)    STEP 1: Take 15 grams of carbohydrates when your blood sugar is low, which includes:   3-4 GLUCOSE TABS  OR  3-4 OZ OF JUICE OR REGULAR SODA OR  ONE TUBE OF GLUCOSE GEL     STEP 2: RECHECK blood sugar in 15 MINUTES STEP 3: If your blood sugar is still low at the 15 minute recheck --> then, go back to STEP 1 and treat AGAIN with another 15 grams of carbohydrates.

## 2019-11-27 NOTE — Progress Notes (Signed)
Name: Dennis Hopkins  Age/ Sex: 59 y.o., male   MRN/ DOB: [id number], 07-Jun-1961     PCP: Donato Schultz, DO   Reason for Endocrinology Evaluation: Type 2 Diabetes Mellitus  Initial Endocrine Consultative Visit: 11/08/2019    PATIENT IDENTIFIER: [name] is a 59 y.o. male with a past medical history of T2DM, HTN, CAD (S/P CABG in 2017) and Dyslipidemia.  The patient has followed with Endocrinology clinic since 11/08/2019 for consultative assistance with management of his diabetes.  DIABETIC HISTORY:  Dennis Hopkins was diagnosed with DM in 2017, has been on metformin in the past Dennis Hopkins attributes flank pain to metformin. His hemoglobin A1c has ranged from 6.8% in 2017, peaking at 12.1% in 2020.  On his initial visit to our clinic his A1c was 12.1% . He had a prescription for Januvia but was not taking.  Dennis Hopkins declined restarting metformin. We started Glipizide.  SUBJECTIVE:   During the last visit (11/08/2018): A1c 12.1% . Was not taking anything. Declined restarting Metformin, we restarted [place] and started Glipizide.    Today (11/27/2019): Dennis Hopkins is here for a follow up on diabetes. He has not been here in 13 months.  He checks his blood sugars 0 times daily.  He eats 3 meals a day, does not snack.  Drinks sugar-sweetened beverages    HOME DIABETES REGIMEN:  Glipizide 5 mg BID  Januvia 100 mg daily   Statin: Yes ACE-I/ARB: Yes   METER DOWNLOAD SUMMARY:does not check    DIABETIC COMPLICATIONS: Microvascular complications:    Denies: CKD, neuropathy   Last eye exam: Completed long time ago   Macrovascular complications:   CAD (S/P CABG 2019)  Denies: PVD, CVA  HISTORY:  Past Medical History:  Past Medical History:  Diagnosis Date  . Chickenpox   . Hyperlipemia   . Hypertension   . Kidney stones 2013   Past Surgical History:  Past Surgical History:  Procedure  Laterality Date  . CARDIAC CATHETERIZATION N/A 01/07/2016   Procedure: Left Heart Cath and Coronary Angiography;  Surgeon: Corky Crafts, MD;  Location: [facility] INVASIVE CV LAB;  Service: Cardiovascular;  Laterality: N/A;  . CORONARY ARTERY BYPASS GRAFT N/A 01/13/2016   Procedure: CORONARY ARTERY BYPASS GRAFTING (CABG) x4 Endoscopic Harvesting of the Right Greater Saphenous Vein;  Surgeon: Loreli Slot, MD;  Location: [facility] OR;  Service: Open Heart Surgery;  Laterality: N/A;  . TEE WITHOUT CARDIOVERSION N/A 01/13/2016   Procedure: TRANSESOPHAGEAL ECHOCARDIOGRAM (TEE);  Surgeon: Loreli Slot, MD;  Location: [facility] OR;  Service: Open Heart Surgery;  Laterality: N/A;  . TONSILLECTOMY      Social History:  reports that he quit smoking about 5 years ago. His smoking use included cigarettes. He has a 70.00 pack-year smoking history. He has never used smokeless tobacco. He reports current alcohol use. He reports that he does not use drugs. Family History:  Family History  Problem Relation Age of Onset  . Arthritis Mother   . Diabetes  Mother   . Arthritis Father   . Hyperlipidemia Father   . Diabetes Father   . Diabetes Brother      HOME MEDICATIONS: Allergies as of 11/27/2019      Reactions   Asa [aspirin] Anaphylaxis, Swelling   Lips swelling      Medication List       Accurate as of November 27, 2019  4:16 PM. If you have any questions, ask your nurse or doctor.        STOP taking these medications   Farxiga 5 MG Tabs tablet Generic drug: dapagliflozin propanediol Stopped by: Dorita Sciara, MD   glipiZIDE 5 MG tablet Commonly known as: GLUCOTROL Stopped by: Dorita Sciara, MD   sitaGLIPtin 100 MG tablet Commonly known as: Januvia Stopped by: Dorita Sciara, MD     TAKE these medications   atorvastatin 80 MG tablet Commonly known as: LIPITOR Take 1 tablet (80 mg total) by mouth daily.   cyclobenzaprine 10 MG tablet Commonly known as:  FLEXERIL Take 1 tablet (10 mg total) by mouth 3 (three) times daily as needed for muscle spasms.   fenofibrate 160 MG tablet Take 1 tablet (160 mg total) by mouth daily.   Insulin Lispro Prot & Lispro (75-25) 100 UNIT/ML Kwikpen Commonly known as: HumaLOG Mix 75/25 KwikPen Inject 24 Units into the skin in the morning and at bedtime. Started by: Dorita Sciara, MD   Insulin Pen Needle 32G X 4 MM Misc 1 Device by Does not apply route in the morning and at bedtime. Started by: Dorita Sciara, MD   lidocaine 5 % Commonly known as: Lidoderm Place 1 patch onto the skin daily. Remove & Discard patch within 12 hours or as directed by MD   lisinopril 5 MG tablet Commonly known as: ZESTRIL Take 1 tablet (5 mg total) by mouth daily.   methocarbamol 500 MG tablet Commonly known as: ROBAXIN TAKE 1 TABLET BY MOUTH TWICE DAILY AS NEEDED FOR MUSCLE SPASM   metoprolol tartrate 100 MG tablet Commonly known as: LOPRESSOR Take 1 tablet (100 mg total) by mouth 2 (two) times daily.        OBJECTIVE:   Vital Signs: BP 128/64 (BP Location: Right Arm, Patient Position: Sitting, Cuff Size: Large)   Pulse 77   Ht 5\' 10"  (1.778 m)   Wt 259 lb 9.6 oz (117.8 kg)   SpO2 98%   BMI 37.25 kg/m   Wt Readings from Last 3 Encounters:  11/27/19 259 lb 9.6 oz (117.8 kg)  11/19/19 258 lb 12.8 oz (117.4 kg)  02/14/19 254 lb (115.2 kg)     Exam: General: Dennis Hopkins appears well and is in NAD  Lungs: Clear with good BS bilat with no rales, rhonchi, or wheezes  Heart: RRR with normal S1 and S2 and no gallops; no murmurs; no rub  Extremities: 1+ pretibial edema.  Neuro: MS is good with appropriate affect, Dennis Hopkins is alert and Ox3     DATA REVIEWED:  Lab Results  Component Value Date   HGBA1C 13.6 (H) 11/19/2019   HGBA1C 12.1 (H) 10/22/2018   HGBA1C 10.1 (H) 01/23/2018   Lab Results  Component Value Date   MICROALBUR 4.4 (H) 11/19/2019   LDLCALC UNABLE TO CALCULATE IF TRIGLYCERIDE OVER 400  mg/dL 01/08/2016   CREATININE 1.08 11/19/2019   Lab Results  Component Value Date   MICRALBCREAT 6.1 11/19/2019     Lab Results  Component Value Date   CHOL 257 (H) 11/19/2019  HDL 42.80 11/19/2019   LDLCALC UNABLE TO CALCULATE IF TRIGLYCERIDE OVER 400 mg/dL 69/62/9528   LDLDIRECT 173.0 11/19/2019   TRIG 275.0 (H) 11/19/2019   CHOLHDL 6 11/19/2019         ASSESSMENT / PLAN / RECOMMENDATIONS:   1) Type 2 Diabetes Mellitus, Poorly controlled, With Macrovascular complications - Most recent A1c of 13.6 %. Goal A1c < 7.0 %.    - Poorly controlled diabetes due to medication non-adherence and dietary indiscretions. We again discussed the importance of low carb diet and avoiding sugar-sweetened beverages.  - His in-office Bg 350 mg/dL.  - I have advised to switch to insulin due to severe hyperglycemia - We discussed microvascular complications of uncontrolled diabetes  -I went over with the patient various strategies as to how he can be reminded to take the second dose of both medications during the day  MEDICATIONS:  - STOP Glipizide - STOP Januvia  - Start Humalog MIX 24 units with Breakfast and 24 units with Supper   EDUCATION / INSTRUCTIONS:  BG monitoring instructions: Patient is instructed to check his blood sugars 2 times a day, before breakfast and supper.  Call [facility] if: BG persistently < 70 or > 300. . I reviewed the Rule of 15 for the treatment of hypoglycemia in detail with the patient. Literature supplied.     2) Diabetic complications:   Eye: Does not have known diabetic retinopathy. Dennis Hopkins urged to have an eye exam   Neuro/ Feet: Does not have known diabetic peripheral neuropathy .   Renal: Patient does not have known baseline CKD. He   is  on an ACEI/ARB at present.    F/U in 3 months    Signed electronically by: Lyndle Herrlich, MD  [facility] Endocrinology  [facility] [address] [place], [place] [address] Phone: [phone] FAX: [phone]   CC: [place] Organ [address] [place] [address] Phone: [phone]  Fax: [phone]  Return to Endocrinology clinic as below: Future Appointments  Date Time Provider Department Center  12/02/2019  4:00 PM Donato Schultz, DO [id number]

## 2019-12-02 ENCOUNTER — Ambulatory Visit (INDEPENDENT_AMBULATORY_CARE_PROVIDER_SITE_OTHER): Payer: PRIVATE HEALTH INSURANCE | Admitting: Family Medicine

## 2019-12-02 ENCOUNTER — Other Ambulatory Visit: Payer: Self-pay

## 2019-12-02 ENCOUNTER — Encounter: Payer: Self-pay | Admitting: Family Medicine

## 2019-12-02 VITALS — BP 132/80 | HR 77 | Temp 97.0°F | Resp 18 | Ht 70.0 in | Wt 260.8 lb

## 2019-12-02 DIAGNOSIS — I10 Essential (primary) hypertension: Secondary | ICD-10-CM | POA: Diagnosis not present

## 2019-12-02 MED ORDER — LISINOPRIL 10 MG PO TABS: 10.0000 mg | ORAL_TABLET | Freq: Every day | ORAL | 3 refills | Status: DC

## 2019-12-02 NOTE — Patient Instructions (Signed)
DASH Eating Plan DASH stands for "Dietary Approaches to Stop Hypertension." The DASH eating plan is a healthy eating plan that has been shown to reduce high blood pressure (hypertension). It may also reduce your risk for type 2 diabetes, heart disease, and stroke. The DASH eating plan may also help with weight loss. What are tips for following this plan?  General guidelines  Avoid eating more than 2,300 mg (milligrams) of salt (sodium) a day. If you have hypertension, you may need to reduce your sodium intake to 1,500 mg a day.  Limit alcohol intake to no more than 1 drink a day for nonpregnant women and 2 drinks a day for men. One drink equals 12 oz of beer, 5 oz of wine, or 1 oz of hard liquor.  Work with your health care provider to maintain a healthy body weight or to lose weight. Ask what an ideal weight is for you.  Get at least 30 minutes of exercise that causes your heart to beat faster (aerobic exercise) most days of the week. Activities may include walking, swimming, or biking.  Work with your health care provider or diet and nutrition specialist (dietitian) to adjust your eating plan to your individual calorie needs. Reading food labels   Check food labels for the amount of sodium per serving. Choose foods with less than 5 percent of the Daily Value of sodium. Generally, foods with less than 300 mg of sodium per serving fit into this eating plan.  To find whole grains, look for the word "whole" as the first word in the ingredient list. Shopping  Buy products labeled as "low-sodium" or "no salt added."  Buy fresh foods. Avoid canned foods and premade or frozen meals. Cooking  Avoid adding salt when cooking. Use salt-free seasonings or herbs instead of table salt or sea salt. Check with your health care provider or pharmacist before using salt substitutes.  Do not fry foods. Cook foods using healthy methods such as baking, boiling, grilling, and broiling instead.  Cook with  heart-healthy oils, such as olive, canola, soybean, or sunflower oil. Meal planning  Eat a balanced diet that includes: ? 5 or more servings of fruits and vegetables each day. At each meal, try to fill half of your plate with fruits and vegetables. ? Up to 6-8 servings of whole grains each day. ? Less than 6 oz of lean meat, poultry, or fish each day. A 3-oz serving of meat is about the same size as a deck of cards. One egg equals 1 oz. ? 2 servings of low-fat dairy each day. ? A serving of nuts, seeds, or beans 5 times each week. ? Heart-healthy fats. Healthy fats called Omega-3 fatty acids are found in foods such as flaxseeds and coldwater fish, like sardines, salmon, and mackerel.  Limit how much you eat of the following: ? Canned or prepackaged foods. ? Food that is high in trans fat, such as fried foods. ? Food that is high in saturated fat, such as fatty meat. ? Sweets, desserts, sugary drinks, and other foods with added sugar. ? Full-fat dairy products.  Do not salt foods before eating.  Try to eat at least 2 vegetarian meals each week.  Eat more home-cooked food and less restaurant, buffet, and fast food.  When eating at a restaurant, ask that your food be prepared with less salt or no salt, if possible. What foods are recommended? The items listed may not be a complete list. Talk with your dietitian about   what dietary choices are best for you. Grains Whole-grain or whole-wheat bread. Whole-grain or whole-wheat pasta. Brown rice. Oatmeal. Quinoa. Bulgur. Whole-grain and low-sodium cereals. Pita bread. Low-fat, low-sodium crackers. Whole-wheat flour tortillas. Vegetables Fresh or frozen vegetables (raw, steamed, roasted, or grilled). Low-sodium or reduced-sodium tomato and vegetable juice. Low-sodium or reduced-sodium tomato sauce and tomato paste. Low-sodium or reduced-sodium canned vegetables. Fruits All fresh, dried, or frozen fruit. Canned fruit in natural juice (without  added sugar). Meat and other protein foods Skinless chicken or turkey. Ground chicken or turkey. Pork with fat trimmed off. Fish and seafood. Egg whites. Dried beans, peas, or lentils. Unsalted nuts, nut butters, and seeds. Unsalted canned beans. Lean cuts of beef with fat trimmed off. Low-sodium, lean deli meat. Dairy Low-fat (1%) or fat-free (skim) milk. Fat-free, low-fat, or reduced-fat cheeses. Nonfat, low-sodium ricotta or cottage cheese. Low-fat or nonfat yogurt. Low-fat, low-sodium cheese. Fats and oils Soft margarine without trans fats. Vegetable oil. Low-fat, reduced-fat, or light mayonnaise and salad dressings (reduced-sodium). Canola, safflower, olive, soybean, and sunflower oils. Avocado. Seasoning and other foods Herbs. Spices. Seasoning mixes without salt. Unsalted popcorn and pretzels. Fat-free sweets. What foods are not recommended? The items listed may not be a complete list. Talk with your dietitian about what dietary choices are best for you. Grains Baked goods made with fat, such as croissants, muffins, or some breads. Dry pasta or rice meal packs. Vegetables Creamed or fried vegetables. Vegetables in a cheese sauce. Regular canned vegetables (not low-sodium or reduced-sodium). Regular canned tomato sauce and paste (not low-sodium or reduced-sodium). Regular tomato and vegetable juice (not low-sodium or reduced-sodium). Pickles. Olives. Fruits Canned fruit in a light or heavy syrup. Fried fruit. Fruit in cream or butter sauce. Meat and other protein foods Fatty cuts of meat. Ribs. Fried meat. Bacon. Sausage. Bologna and other processed lunch meats. Salami. Fatback. Hotdogs. Bratwurst. Salted nuts and seeds. Canned beans with added salt. Canned or smoked fish. Whole eggs or egg yolks. Chicken or turkey with skin. Dairy Whole or 2% milk, cream, and half-and-half. Whole or full-fat cream cheese. Whole-fat or sweetened yogurt. Full-fat cheese. Nondairy creamers. Whipped toppings.  Processed cheese and cheese spreads. Fats and oils Butter. Stick margarine. Lard. Shortening. Ghee. Bacon fat. Tropical oils, such as coconut, palm kernel, or palm oil. Seasoning and other foods Salted popcorn and pretzels. Onion salt, garlic salt, seasoned salt, table salt, and sea salt. Worcestershire sauce. Tartar sauce. Barbecue sauce. Teriyaki sauce. Soy sauce, including reduced-sodium. Steak sauce. Canned and packaged gravies. Fish sauce. Oyster sauce. Cocktail sauce. Horseradish that you find on the shelf. Ketchup. Mustard. Meat flavorings and tenderizers. Bouillon cubes. Hot sauce and Tabasco sauce. Premade or packaged marinades. Premade or packaged taco seasonings. Relishes. Regular salad dressings. Where to find more information:  National Heart, Lung, and Blood Institute: www.nhlbi.nih.gov  American Heart Association: www.heart.org Summary  The DASH eating plan is a healthy eating plan that has been shown to reduce high blood pressure (hypertension). It may also reduce your risk for type 2 diabetes, heart disease, and stroke.  With the DASH eating plan, you should limit salt (sodium) intake to 2,300 mg a day. If you have hypertension, you may need to reduce your sodium intake to 1,500 mg a day.  When on the DASH eating plan, aim to eat more fresh fruits and vegetables, whole grains, lean proteins, low-fat dairy, and heart-healthy fats.  Work with your health care provider or diet and nutrition specialist (dietitian) to adjust your eating plan to your   individual calorie needs. This information is not intended to replace advice given to you by your health care provider. Make sure you discuss any questions you have with your health care provider. Document Revised: 05/19/2017 Document Reviewed: 05/30/2016 Elsevier Patient Education  2020 Elsevier Inc.  

## 2019-12-02 NOTE — Progress Notes (Signed)
Patient ID: Dennis Hopkins, male    DOB: Jul 22, 1960  Age: 59 y.o. MRN: [id number]    Subjective:  Subjective  HPI Dennis Hopkins presents for f/u bp.   Pt is now seeing endo for DM No complaints   Review of Systems  Constitutional: Negative for appetite change, diaphoresis, fatigue and unexpected weight change.  Eyes: Negative for pain, redness and visual disturbance.  Respiratory: Negative for cough, chest tightness, shortness of breath and wheezing.   Cardiovascular: Negative for chest pain, palpitations and leg swelling.  Endocrine: Negative for cold intolerance, heat intolerance, polydipsia, polyphagia and polyuria.  Genitourinary: Negative for difficulty urinating, dysuria and frequency.  Neurological: Negative for dizziness, light-headedness, numbness and headaches.    History Past Medical History:  Diagnosis Date  . Chickenpox   . Hyperlipemia   . Hypertension   . Kidney stones 2013    He has a past surgical history that includes Tonsillectomy; Cardiac catheterization (N/A, 01/07/2016); Coronary artery bypass graft (N/A, 01/13/2016); and TEE without cardioversion (N/A, 01/13/2016).   His family history includes Arthritis in his father and mother; Diabetes in his brother, father, and mother; Hyperlipidemia in his father.He reports that he quit smoking about 5 years ago. His smoking use included cigarettes. He has a 70.00 pack-year smoking history. He has never used smokeless tobacco. He reports current alcohol use. He reports that he does not use drugs.  Current Outpatient Medications on File Prior to Visit  Medication Sig Dispense Refill  . atorvastatin (LIPITOR) 80 MG tablet Take 1 tablet (80 mg total) by mouth daily. 90 tablet 1  . Insulin Lispro Prot & Lispro (HUMALOG MIX 75/25 KWIKPEN) (75-25) 100 UNIT/ML Kwikpen Inject 24 Units into the skin in the morning and at bedtime. 30 mL 6  . Insulin Pen Needle 32G X 4 MM MISC 1 Device by Does not apply route in the morning and at  bedtime. 100 each 6  . methocarbamol (ROBAXIN) 500 MG tablet TAKE 1 TABLET BY MOUTH TWICE DAILY AS NEEDED FOR MUSCLE SPASM 45 tablet 1  . metoprolol tartrate (LOPRESSOR) 100 MG tablet Take 1 tablet (100 mg total) by mouth 2 (two) times daily. 60 tablet 3  . cyclobenzaprine (FLEXERIL) 10 MG tablet Take 1 tablet (10 mg total) by mouth 3 (three) times daily as needed for muscle spasms. (Patient not taking: Reported on 11/27/2019) 30 tablet 0  . fenofibrate 160 MG tablet Take 1 tablet (160 mg total) by mouth daily. (Patient not taking: Reported on 08/06/2018) 30 tablet 2  . lidocaine (LIDODERM) 5 % Place 1 patch onto the skin daily. Remove & Discard patch within 12 hours or as directed by MD (Patient not taking: Reported on 11/27/2019) 30 patch 0   No current facility-administered medications on file prior to visit.     Objective:  Objective  Physical Exam Vitals and nursing note reviewed.  Constitutional:      General: He is not in acute distress.    Appearance: He is well-developed.  Cardiovascular:     Rate and Rhythm: Normal rate and regular rhythm.     Heart sounds: Normal heart sounds.  Pulmonary:     Effort: Pulmonary effort is normal. No respiratory distress.     Breath sounds: Normal breath sounds.  Psychiatric:        Behavior: Behavior normal.        Thought Content: Thought content normal.        Judgment: Judgment normal.    BP 132/80 (BP Location:  Right Arm, Patient Position: Sitting, Cuff Size: Large)   Pulse 77   Temp (!) 97 F (36.1 C) (Temporal)   Resp 18   Ht 5\' 10"  (1.778 m)   Wt 260 lb 12.8 oz (118.3 kg)   SpO2 97%   BMI 37.42 kg/m  Wt Readings from Last 3 Encounters:  12/02/19 260 lb 12.8 oz (118.3 kg)  11/27/19 259 lb 9.6 oz (117.8 kg)  11/19/19 258 lb 12.8 oz (117.4 kg)     Lab Results  Component Value Date   WBC 11.2 (H) 01/16/2016   HGB 10.2 (L) 01/16/2016   HCT 31.8 (L) 01/16/2016   PLT 189 01/16/2016   GLUCOSE 313 (H) 11/19/2019   CHOL 257 (H)  11/19/2019   TRIG 275.0 (H) 11/19/2019   HDL 42.80 11/19/2019   LDLDIRECT 173.0 11/19/2019   LDLCALC UNABLE TO CALCULATE IF TRIGLYCERIDE OVER 400 mg/dL 01/08/2016   ALT 36 11/19/2019   AST 28 11/19/2019   NA 135 11/19/2019   K 4.0 11/19/2019   CL 98 11/19/2019   CREATININE 1.08 11/19/2019   BUN 14 11/19/2019   CO2 30 11/19/2019   TSH 1.54 09/29/2014   INR 1.32 01/13/2016   HGBA1C 13.6 (H) 11/19/2019   MICROALBUR 4.4 (H) 11/19/2019    CT Renal Stone Study  Result Date: 02/09/2019 CLINICAL DATA:  Left low back pain for 2 weeks. EXAM: CT ABDOMEN AND PELVIS WITHOUT CONTRAST TECHNIQUE: Multidetector CT imaging of the abdomen and pelvis was performed following the standard protocol without IV contrast. COMPARISON:  08/12/2008 FINDINGS: Lower chest: Unremarkable. Hepatobiliary: The liver shows diffusely decreased attenuation suggesting fat deposition. There is no evidence for gallstones, gallbladder wall thickening, or pericholecystic fluid. No intrahepatic or extrahepatic biliary dilation. Pancreas: No focal mass lesion. No dilatation of the main duct. No intraparenchymal cyst. No peripancreatic edema. Spleen: No splenomegaly. No focal mass lesion. Adrenals/Urinary Tract: No adrenal nodule or mass. Right kidney and ureter unremarkable. Clustered stones are a single irregular stone in the lower pole left kidney measures 4 x 11 x 9 mm. 1.7 x 0.7 x 0.8 cm stone is identified in the left renal pelvis with mild left hydronephrosis. No left ureteral stone. No bladder stone. Stomach/Bowel: Stomach is unremarkable. No gastric wall thickening. No evidence of outlet obstruction. Duodenum is normally positioned as is the ligament of Treitz. No small bowel wall thickening. No small bowel dilatation. The terminal ileum is normal. The appendix is normal. No gross colonic mass. No colonic wall thickening. Vascular/Lymphatic: There is abdominal aortic atherosclerosis without aneurysm. There is no gastrohepatic or  hepatoduodenal ligament lymphadenopathy. No intraperitoneal or retroperitoneal lymphadenopathy. No pelvic sidewall lymphadenopathy. Reproductive: The prostate gland and seminal vesicles are unremarkable. Other: No intraperitoneal free fluid. Musculoskeletal: Small left groin hernia contains only fat. No worrisome lytic or sclerotic osseous abnormality. IMPRESSION: 1. 1.7 x 0.7 x 0.8 cm stone in the left renal pelvis with mild left hydronephrosis. 2. Clustered stones versus single irregular stone in the lower pole left kidney. 3. Hepatic steatosis. 4.  Aortic Atherosclerois (ICD10-170.0) Electronically Signed   By: Misty Stanley M.D.   On: 02/09/2019 16:59     Assessment & Plan:  Plan  I have discontinued Ion W. Shi's lisinopril. I am also having him start on lisinopril. Additionally, I am having him maintain his atorvastatin, fenofibrate, cyclobenzaprine, lidocaine, metoprolol tartrate, methocarbamol, Insulin Lispro Prot & Lispro, and Insulin Pen Needle.  Meds ordered this encounter  Medications  . lisinopril (ZESTRIL) 10 MG tablet  Sig: Take 1 tablet (10 mg total) by mouth daily.    Dispense:  90 tablet    Refill:  3    Problem List Items Addressed This Visit      Unprioritized   HTN (hypertension) - Primary   Relevant Medications   lisinopril (ZESTRIL) 10 MG tablet    Poorly controlled will alter medications, encouraged DASH diet, minimize caffeine and obtain adequate sleep. Report concerning symptoms and follow up as directed and as needed con't metoprolol and [employer] lisinopril 10 mg daily   Follow-up: Return in about 3 months (around 03/03/2020).  Donato Schultz, DO

## 2020-01-28 ENCOUNTER — Telehealth (INDEPENDENT_AMBULATORY_CARE_PROVIDER_SITE_OTHER): Payer: PRIVATE HEALTH INSURANCE | Admitting: Family Medicine

## 2020-01-28 ENCOUNTER — Other Ambulatory Visit: Payer: Self-pay

## 2020-01-28 ENCOUNTER — Encounter: Payer: Self-pay | Admitting: Family Medicine

## 2020-01-28 VITALS — BP 154/94 | HR 81 | Temp 97.3°F | Ht 70.0 in

## 2020-01-28 DIAGNOSIS — J40 Bronchitis, not specified as acute or chronic: Secondary | ICD-10-CM

## 2020-01-28 DIAGNOSIS — R0981 Nasal congestion: Secondary | ICD-10-CM | POA: Diagnosis not present

## 2020-01-28 MED ORDER — AZITHROMYCIN 250 MG PO TABS: ORAL_TABLET | ORAL | 0 refills | Status: DC

## 2020-01-28 MED ORDER — FLUTICASONE PROPIONATE 50 MCG/ACT NA SUSP: 2.0000 | Freq: Every day | NASAL | 6 refills | Status: DC

## 2020-01-28 NOTE — Progress Notes (Signed)
Virtual Visit via Video Note  I connected with Dennis Hopkins on 01/28/20 at  4:20 PM EDT by a video enabled telemedicine application and verified that I am speaking with the correct person using two identifiers.  Location: Patient: home alone --- wife in another room  Provider: office    I discussed the limitations of evaluation and management by telemedicine and the availability of in person appointments. The patient expressed understanding and agreed to proceed.  History of Present Illness: Pt is home c/o scratchy throat  , + dry cough , nasal congestion --- 1-2 months   No fever   No loss of taste or smell.   Pt has not taken anything for this      Observations/Objective:  Vitals:   01/28/20 1622  BP: (!) 154/94  Pulse: 81  Temp: (!) 97.3 F (36.3 C)  SpO2: 97%  pt is in nad--- -dry cough and scratchy throat x 1-2 months  Assessment and Plan: 1. Bronchitis z pack mucinex prn cough covid test if no improvement  -   2. Mild nasal congestion  - azithromycin (ZITHROMAX Z-PAK) 250 MG tablet; As directed  Dispense: 6 each; Refill: 0 flonase and claritin  --- covid test if no improvement  Follow Up Instructions:    I discussed the assessment and treatment plan with the patient. The patient was provided an opportunity to ask questions and all were answered. The patient agreed with the plan and demonstrated an understanding of the instructions.   The patient was advised to call back or seek an in-person evaluation if the symptoms worsen or if the condition fails to improve as anticipated.  I provided 25 minutes of non-face-to-face time during this encounter.   Donato Schultz, DO

## 2020-02-25 NOTE — Progress Notes (Deleted)
Name: Dennis Hopkins  Age/ Sex: 59 y.o., male   MRN/ DOB: [id number], [birth year]     PCP: Donato Schultz, DO   Reason for Endocrinology Evaluation: Type 2 Diabetes Mellitus  Initial Endocrine Consultative Visit: 11/08/2019    PATIENT IDENTIFIER: [name] is a 59 y.o. male with a past medical history of T2DM, HTN, CAD (S/P CABG in 2017) and Dyslipidemia.  The patient has followed with Endocrinology clinic since 11/08/2019 for consultative assistance with management of his diabetes.  DIABETIC HISTORY:  Mr. Mundis was diagnosed with DM in 2017, has been on metformin in the past pt attributes flank pain to metformin. His hemoglobin A1c has ranged from 6.8% in 2017, peaking at 12.1% in 2020.  On his initial visit to our clinic his A1c was 12.1% . He had a prescription for Januvia but was not taking.  Pt declined restarting metformin. We started Glipizide.    By 11/2019 we stopped Glipizide and Januvia and started insulin mix with an A1c of 13.6%     SUBJECTIVE:   During the last visit (11/27/2019): A1c 13.6% .We stopped Glipizide, Januvia and started HUmalog Mix     Today (02/26/2020): [name] is here for a follow up on diabetes. He has not been here in 13 months.  He checks his blood sugars 0 times daily.  He eats 3 meals a day, does not snack.  Drinks sugar-sweetened beverages    HOME DIABETES REGIMEN:  Humalog Mix 24 units BID   Statin: Yes ACE-I/ARB: Yes   METER DOWNLOAD SUMMARY:does not check    DIABETIC COMPLICATIONS: Microvascular complications:    Denies: CKD, neuropathy   Last eye exam: Completed long time ago   Macrovascular complications:   CAD (S/P CABG 2019)  Denies: PVD, CVA  HISTORY:  Past Medical History:  Past Medical History:  Diagnosis Date  . Chickenpox   . Hyperlipemia   . Hypertension   . Kidney stones 2013   Past Surgical History:    Past Surgical History:  Procedure Laterality Date  . CARDIAC CATHETERIZATION N/A 01/07/2016   Procedure: Left Heart Cath and Coronary Angiography;  Surgeon: Corky Crafts, MD;  Location: [facility] INVASIVE CV LAB;  Service: Cardiovascular;  Laterality: N/A;  . CORONARY ARTERY BYPASS GRAFT N/A 01/13/2016   Procedure: CORONARY ARTERY BYPASS GRAFTING (CABG) x4 Endoscopic Harvesting of the Right Greater Saphenous Vein;  Surgeon: Loreli Slot, MD;  Location: [facility] OR;  Service: Open Heart Surgery;  Laterality: N/A;  . TEE WITHOUT CARDIOVERSION N/A 01/13/2016   Procedure: TRANSESOPHAGEAL ECHOCARDIOGRAM (TEE);  Surgeon: Loreli Slot, MD;  Location: [facility] OR;  Service: Open Heart Surgery;  Laterality: N/A;  . TONSILLECTOMY      Social History:  reports that he quit smoking about 6 years ago. His smoking use included cigarettes. He has a 70.00 pack-year smoking history. He has never used smokeless tobacco. He reports current alcohol use. He reports that he does not use drugs. Family History:  Family History  Problem Relation Age of Onset  . Arthritis Mother   . Diabetes Mother   . Arthritis Father   . Hyperlipidemia Father   . Diabetes Father   . Diabetes Brother      HOME MEDICATIONS: Allergies as of 02/26/2020      Reactions   Asa [aspirin] Anaphylaxis, Swelling   Lips swelling      Medication List       Accurate as of February 26, 2020  8:40 AM. If you have any questions, ask your nurse or doctor.        atorvastatin 80 MG tablet Commonly known as: LIPITOR Take 1 tablet (80 mg total) by mouth daily.   azithromycin 250 MG tablet Commonly known as: Zithromax Z-Pak As directed   cyclobenzaprine 10 MG tablet Commonly known as: FLEXERIL Take 1 tablet (10 mg total) by mouth 3 (three) times daily as needed for muscle spasms.   fenofibrate 160 MG tablet Take 1 tablet (160 mg total) by mouth daily.   fluticasone 50 MCG/ACT nasal spray Commonly known as: FLONASE Place 2  sprays into both nostrils daily.   Insulin Lispro Prot & Lispro (75-25) 100 UNIT/ML Kwikpen Commonly known as: HumaLOG Mix 75/25 KwikPen Inject 24 Units into the skin in the morning and at bedtime.   Insulin Pen Needle 32G X 4 MM Misc 1 Device by Does not apply route in the morning and at bedtime.   lidocaine 5 % Commonly known as: Lidoderm Place 1 patch onto the skin daily. Remove & Discard patch within 12 hours or as directed by MD   lisinopril 10 MG tablet Commonly known as: ZESTRIL Take 1 tablet (10 mg total) by mouth daily.   methocarbamol 500 MG tablet Commonly known as: ROBAXIN TAKE 1 TABLET BY MOUTH TWICE DAILY AS NEEDED FOR MUSCLE SPASM   metoprolol tartrate 100 MG tablet Commonly known as: LOPRESSOR Take 1 tablet (100 mg total) by mouth 2 (two) times daily.        OBJECTIVE:   Vital Signs: There were no vitals taken for this visit.  Wt Readings from Last 3 Encounters:  12/02/19 260 lb 12.8 oz (118.3 kg)  11/27/19 259 lb 9.6 oz (117.8 kg)  11/19/19 258 lb 12.8 oz (117.4 kg)     Exam: General: Pt appears well and is in NAD  Lungs: Clear with good BS bilat with no rales, rhonchi, or wheezes  Heart: RRR with normal S1 and S2 and no gallops; no murmurs; no rub  Extremities: 1+ pretibial edema.  Neuro: MS is good with appropriate affect, pt is alert and Ox3     DATA REVIEWED:  Lab Results  Component Value Date   HGBA1C 13.6 (H) 11/19/2019   HGBA1C 12.1 (H) 10/22/2018   HGBA1C 10.1 (H) 01/23/2018   Lab Results  Component Value Date   MICROALBUR 4.4 (H) 11/19/2019   LDLCALC UNABLE TO CALCULATE IF TRIGLYCERIDE OVER 400 mg/dL 11/91/4782   CREATININE 1.08 11/19/2019   Lab Results  Component Value Date   MICRALBCREAT 6.1 11/19/2019     Lab Results  Component Value Date   CHOL 257 (H) 11/19/2019   HDL 42.80 11/19/2019   LDLCALC UNABLE TO CALCULATE IF TRIGLYCERIDE OVER 400 mg/dL 95/62/1308   LDLDIRECT 173.0 11/19/2019   TRIG 275.0 (H) 11/19/2019     CHOLHDL 6 11/19/2019         ASSESSMENT / PLAN / RECOMMENDATIONS:   1) Type 2 Diabetes Mellitus, Poorly controlled, With Macrovascular complications - Most recent  A1c of 13.6 %. Goal A1c < 7.0 %.    - Poorly controlled diabetes due to medication non-adherence and dietary indiscretions. We again discussed the importance of low carb diet and avoiding sugar-sweetened beverages.  - His in-office Bg 350 mg/dL.  - I have advised to switch to insulin due to severe hyperglycemia - We discussed microvascular complications of uncontrolled diabetes  -I went over with the patient various strategies as to how he can be reminded to take the second dose of both medications during the day  MEDICATIONS:  - STOP Glipizide - STOP Januvia  - Start Humalog MIX 24 units with Breakfast and 24 units with Supper   EDUCATION / INSTRUCTIONS:  BG monitoring instructions: Patient is instructed to check his blood sugars 2 times a day, before breakfast and supper.  Call [place] [facility] if: BG persistently < 70 or > 300. . I reviewed the Rule of 15 for the treatment of hypoglycemia in detail with the patient. Literature supplied.     2) Diabetic complications:   Eye: Does not have known diabetic retinopathy. Pt urged to have an eye exam   Neuro/ Feet: Does not have known diabetic peripheral neuropathy .   Renal: Patient does not have known baseline CKD. He   is  on an ACEI/ARB at present.    F/U in 3 months    Signed electronically by: Lyndle Herrlich, MD  [facility] Endocr[facility]ology  [facility] [address] [place], [place] [address] Phone: [phone] FAX: [phone]   CC: [facility] [address] [place] [address] Phone: [phone]  Fax: [phone]  Return to Endocrinology clinic as below: Future Appointments  Date Time Provider Department Center  02/26/2020  3:40 PM Kupono Marling, Konrad Dolores, MD  [facility]  03/06/2020  4:00 PM Donato Schultz, DO [facility]

## 2020-02-26 ENCOUNTER — Ambulatory Visit: Payer: PRIVATE HEALTH INSURANCE | Admitting: Internal Medicine

## 2020-02-26 DIAGNOSIS — Z0289 Encounter for other administrative examinations: Secondary | ICD-10-CM

## 2020-03-06 ENCOUNTER — Ambulatory Visit: Payer: PRIVATE HEALTH INSURANCE | Admitting: Family Medicine

## 2020-04-09 ENCOUNTER — Ambulatory Visit (INDEPENDENT_AMBULATORY_CARE_PROVIDER_SITE_OTHER): Payer: PRIVATE HEALTH INSURANCE | Admitting: Family Medicine

## 2020-04-09 ENCOUNTER — Encounter: Payer: Self-pay | Admitting: Family Medicine

## 2020-04-09 ENCOUNTER — Other Ambulatory Visit: Payer: Self-pay

## 2020-04-09 VITALS — BP 140/68 | HR 75 | Temp 97.8°F | Resp 18 | Ht 70.0 in | Wt 266.0 lb

## 2020-04-09 DIAGNOSIS — E1169 Type 2 diabetes mellitus with other specified complication: Secondary | ICD-10-CM

## 2020-04-09 DIAGNOSIS — E1165 Type 2 diabetes mellitus with hyperglycemia: Secondary | ICD-10-CM

## 2020-04-09 DIAGNOSIS — E1151 Type 2 diabetes mellitus with diabetic peripheral angiopathy without gangrene: Secondary | ICD-10-CM

## 2020-04-09 DIAGNOSIS — R8290 Unspecified abnormal findings in urine: Secondary | ICD-10-CM

## 2020-04-09 DIAGNOSIS — M545 Low back pain, unspecified: Secondary | ICD-10-CM

## 2020-04-09 DIAGNOSIS — IMO0002 Reserved for concepts with insufficient information to code with codable children: Secondary | ICD-10-CM

## 2020-04-09 DIAGNOSIS — E785 Hyperlipidemia, unspecified: Secondary | ICD-10-CM

## 2020-04-09 DIAGNOSIS — N200 Calculus of kidney: Secondary | ICD-10-CM

## 2020-04-09 LAB — POC URINALSYSI DIPSTICK (AUTOMATED)
Bilirubin, UA: NEGATIVE
Glucose, UA: POSITIVE — AB
Ketones, UA: NEGATIVE
Leukocytes, UA: NEGATIVE
Nitrite, UA: NEGATIVE
Protein, UA: POSITIVE — AB
Spec Grav, UA: >=1.03 — AB (ref 1.010–1.025)
Urobilinogen, UA: 0.2 E.U./dL
pH, UA: 5 (ref 5.0–8.0)

## 2020-04-09 NOTE — Assessment & Plan Note (Signed)
Tolerating statin, encouraged heart healthy diet, avoid trans fats, minimize simple carbs and saturated fats. Increase exercise as tolerated con't lipitor and fenofibrate  

## 2020-04-09 NOTE — Assessment & Plan Note (Signed)
Strain urine  Refer to urology

## 2020-04-09 NOTE — Patient Instructions (Signed)
Kidney Stones  Kidney stones are solid, rock-like deposits that form inside of the kidneys. The kidneys are a pair of organs that make urine. A kidney stone may form in a kidney and move into other parts of the urinary tract, including the tubes that connect the kidneys to the bladder (ureters), the bladder, and the tube that carries urine out of the body (urethra). As the stone moves through these areas, it can cause intense pain and block the flow of urine. Kidney stones are created when high levels of certain minerals are found in the urine. The stones are usually passed out of the body through urination, but in some cases, medical treatment may be needed to remove them. What are the causes? Kidney stones may be caused by:  A condition in which certain glands produce too much parathyroid hormone (primary hyperparathyroidism), which causes too much calcium buildup in the blood.  A buildup of uric acid crystals in the bladder (hyperuricosuria). Uric acid is a chemical that the body produces when you eat certain foods. It usually exits the body in the urine.  Narrowing (stricture) of one or both of the ureters.  A kidney blockage that is present at birth (congenital obstruction).  Past surgery on the kidney or the ureters, such as gastric bypass surgery. What increases the risk? The following factors may make you more likely to develop this condition:  Having had a kidney stone in the past.  Having a family history of kidney stones.  Not drinking enough water.  Eating a diet that is high in protein, salt (sodium), or sugar.  Being overweight or obese. What are the signs or symptoms? Symptoms of a kidney stone may include:  Pain in the side of the abdomen, right below the ribs (flank pain). Pain usually spreads (radiates) to the groin.  Needing to urinate frequently or urgently.  Painful urination.  Blood in the urine (hematuria).  Nausea.  Vomiting.  Fever and chills. How  is this diagnosed? This condition may be diagnosed based on:  Your symptoms and medical history.  A physical exam.  Blood tests.  Urine tests. These may be done before and after the stone passes out of your body through urination.  Imaging tests, such as a CT scan, abdominal X-ray, or ultrasound.  A procedure to examine the inside of the bladder (cystoscopy). How is this treated? Treatment for kidney stones depends on the size, location, and makeup of the stones. Kidney stones will often pass out of the body through urination. You may need to:  Increase your fluid intake to help pass the stone. In some cases, you may be given fluids through an IV and may need to be monitored at the hospital.  Take medicine for pain.  Make changes in your diet to help prevent kidney stones from coming back. Sometimes, medical procedures are needed to remove a kidney stone. This may involve:  A procedure to break up kidney stones using: ? A focused beam of light (laser therapy). ? Shock waves (extracorporeal shock wave lithotripsy).  Surgery to remove kidney stones. This may be needed if you have severe pain or have stones that block your urinary tract. Follow these instructions at home: Medicines  Take over-the-counter and prescription medicines only as told by your health care provider.  Ask your health care provider if the medicine prescribed to you requires you to avoid driving or using heavy machinery. Eating and drinking  Drink enough fluid to keep your urine pale yellow.   You may be instructed to drink at least 8-10 glasses of water each day. This will help you pass the kidney stone.  If directed, change your diet. This may include: ? Limiting how much sodium you eat. ? Eating more fruits and vegetables. ? Limiting how much animal protein--such as red meat, poultry, fish, and eggs--you eat.  Follow instructions from your health care provider about eating or drinking  restrictions. General instructions  Collect urine samples as told by your health care provider. You may need to collect a urine sample: ? 24 hours after you pass the stone. ? 8-12 weeks after passing the kidney stone, and every 6-12 months after that.  Strain your urine every time you urinate, for as long as directed. Use the strainer that your health care provider recommends.  Do not throw out the kidney stone after passing it. Keep the stone so it can be tested by your health care provider. Testing the makeup of your kidney stone may help prevent you from getting kidney stones in the future.  Keep all follow-up visits as told by your health care provider. This is important. You may need follow-up X-rays or ultrasounds to make sure that your stone has passed. How is this prevented? To prevent another kidney stone:  Drink enough fluid to keep your urine pale yellow. This is the best way to prevent kidney stones.  Eat a healthy diet and follow recommendations from your health care provider about foods to avoid. You may be instructed to eat a low-protein diet. Recommendations vary depending on the type of kidney stone that you have.  Maintain a healthy weight. Where to find more information  National Kidney Foundation (NKF): www.kidney.org  [employer] (UCF): www.urologyhealth.org Contact a health care provider if:  You have pain that gets worse or does not get better with medicine. Get help right away if:  You have a fever or chills.  You develop severe pain.  You develop new abdominal pain.  You faint.  You are unable to urinate. Summary  Kidney stones are solid, rock-like deposits that form inside of the kidneys.  Kidney stones can cause nausea, vomiting, blood in the urine, abdominal pain, and the urge to urinate frequently.  Treatment for kidney stones depends on the size, location, and makeup of the stones. Kidney stones will often pass out of the body  through urination.  Kidney stones can be prevented by drinking enough fluids, eating a healthy diet, and maintaining a healthy weight. This information is not intended to replace advice given to you by your health care provider. Make sure you discuss any questions you have with your health care provider. Document Revised: 10/23/2018 Document Reviewed: 10/23/2018 Elsevier Patient Education  2020 Elsevier Inc.  

## 2020-04-09 NOTE — Assessment & Plan Note (Signed)
F/u endo  

## 2020-04-09 NOTE — Progress Notes (Signed)
Patient ID: Dennis Hopkins, male    DOB: 01/11/1961  Age: 59 y.o. MRN: [id number]    Subjective:  Subjective  HPI Dennis Hopkins presents for f/u L kidney pain and stone.  He passed a stone thurs/ Friday last week.  He still has some low back pain   But no other symptoms ---ct scan done 2019 show mult stones on L side   Review of Systems  Constitutional: Negative for fever.  HENT: Negative for congestion.   Respiratory: Negative for cough and shortness of breath.   Cardiovascular: Negative for chest pain, palpitations and leg swelling.  Gastrointestinal: Negative for vomiting.  Genitourinary: Positive for flank pain. Negative for discharge, frequency, genital sores, hematuria, penile pain and penile swelling.  Musculoskeletal: Positive for back pain.  Skin: Negative for rash.  Neurological: Negative for headaches.    History Past Medical History:  Diagnosis Date  . Chickenpox   . Hyperlipemia   . Hypertension   . Kidney stones 2013    He has a past surgical history that includes Tonsillectomy; Cardiac catheterization (N/A, 01/07/2016); Coronary artery bypass graft (N/A, 01/13/2016); and TEE without cardioversion (N/A, 01/13/2016).   His family history includes Arthritis in his father and mother; Diabetes in his brother, father, and mother; Hyperlipidemia in his father.He reports that he quit smoking about 6 years ago. His smoking use included cigarettes. He has a 70.00 pack-year smoking history. He has never used smokeless tobacco. He reports current alcohol use. He reports that he does not use drugs.  Current Outpatient Medications on File Prior to Visit  Medication Sig Dispense Refill  . fluticasone (FLONASE) 50 MCG/ACT nasal spray Place 2 sprays into both nostrils daily. 16 g 6  . Insulin Lispro Prot & Lispro (HUMALOG MIX 75/25 KWIKPEN) (75-25) 100 UNIT/ML Kwikpen Inject 24 Units into the skin in the morning and at bedtime. 30 mL 6  . Insulin Pen Needle 32G X 4 MM MISC 1 Device  by Does not apply route in the morning and at bedtime. 100 each 6  . methocarbamol (ROBAXIN) 500 MG tablet TAKE 1 TABLET BY MOUTH TWICE DAILY AS NEEDED FOR MUSCLE SPASM 45 tablet 1  . metoprolol tartrate (LOPRESSOR) 100 MG tablet Take 1 tablet (100 mg total) by mouth 2 (two) times daily. 60 tablet 3  . atorvastatin (LIPITOR) 80 MG tablet Take 1 tablet (80 mg total) by mouth daily. 90 tablet 1  . fenofibrate 160 MG tablet Take 1 tablet (160 mg total) by mouth daily. (Patient not taking: Reported on 08/06/2018) 30 tablet 2  . lidocaine (LIDODERM) 5 % Place 1 patch onto the skin daily. Remove & Discard patch within 12 hours or as directed by MD (Patient not taking: Reported on 11/27/2019) 30 patch 0  . lisinopril (ZESTRIL) 10 MG tablet Take 1 tablet (10 mg total) by mouth daily. (Patient not taking: Reported on 04/09/2020) 90 tablet 3   No current facility-administered medications on file prior to visit.     Objective:  Objective  Physical Exam Vitals and nursing note reviewed.  Constitutional:      General: He is sleeping.     Appearance: He is well-developed.  HENT:     Head: Normocephalic and atraumatic.  Eyes:     Pupils: Pupils are equal, round, and reactive to light.  Neck:     Thyroid: No thyromegaly.  Cardiovascular:     Rate and Rhythm: Normal rate and regular rhythm.     Heart sounds: No murmur heard.  Pulmonary:     Effort: Pulmonary effort is normal. No respiratory distress.     Breath sounds: Normal breath sounds. No wheezing or rales.  Chest:     Chest wall: No tenderness.  Musculoskeletal:        General: Tenderness present.     Cervical back: Normal range of motion and neck supple.     Thoracic back: Tenderness present.     Lumbar back: Tenderness present. Negative right straight leg raise test and negative left straight leg raise test.       Back:  Skin:    General: Skin is warm and dry.  Neurological:     Mental Status: He is oriented to person, place, and  time.  Psychiatric:        Behavior: Behavior normal.        Thought Content: Thought content normal.        Judgment: Judgment normal.    BP 140/68 (BP Location: Right Arm, Patient Position: Sitting, Cuff Size: Large)   Pulse 75   Temp 97.8 F (36.6 C) (Oral)   Resp 18   Ht 5\' 10"  (1.778 m)   Wt 266 lb (120.7 kg)   SpO2 98%   BMI 38.17 kg/m  Wt Readings from Last 3 Encounters:  04/09/20 266 lb (120.7 kg)  12/02/19 260 lb 12.8 oz (118.3 kg)  11/27/19 259 lb 9.6 oz (117.8 kg)     Lab Results  Component Value Date   WBC 11.2 (H) 01/16/2016   HGB 10.2 (L) 01/16/2016   HCT 31.8 (L) 01/16/2016   PLT 189 01/16/2016   GLUCOSE 313 (H) 11/19/2019   CHOL 257 (H) 11/19/2019   TRIG 275.0 (H) 11/19/2019   HDL 42.80 11/19/2019   LDLDIRECT 173.0 11/19/2019   LDLCALC UNABLE TO CALCULATE IF TRIGLYCERIDE OVER 400 mg/dL 01/19/2020   ALT 36 50/35/4656   AST 28 11/19/2019   NA 135 11/19/2019   K 4.0 11/19/2019   CL 98 11/19/2019   CREATININE 1.08 11/19/2019   BUN 14 11/19/2019   CO2 30 11/19/2019   TSH 1.54 09/29/2014   INR 1.32 01/13/2016   HGBA1C 13.6 (H) 11/19/2019   MICROALBUR 4.4 (H) 11/19/2019    CT Renal Stone Study  Result Date: 02/09/2019 CLINICAL DATA:  Left low back pain for 2 weeks. EXAM: CT ABDOMEN AND PELVIS WITHOUT CONTRAST TECHNIQUE: Multidetector CT imaging of the abdomen and pelvis was performed following the standard protocol without IV contrast. COMPARISON:  08/12/2008 FINDINGS: Lower chest: Unremarkable. Hepatobiliary: The liver shows diffusely decreased attenuation suggesting fat deposition. There is no evidence for gallstones, gallbladder wall thickening, or pericholecystic fluid. No intrahepatic or extrahepatic biliary dilation. Pancreas: No focal mass lesion. No dilatation of the main duct. No intraparenchymal cyst. No peripancreatic edema. Spleen: No splenomegaly. No focal mass lesion. Adrenals/Urinary Tract: No adrenal nodule or mass. Right kidney and ureter  unremarkable. Clustered stones are a single irregular stone in the lower pole left kidney measures 4 x 11 x 9 mm. 1.7 x 0.7 x 0.8 cm stone is identified in the left renal pelvis with mild left hydronephrosis. No left ureteral stone. No bladder stone. Stomach/Bowel: Stomach is unremarkable. No gastric wall thickening. No evidence of outlet obstruction. Duodenum is normally positioned as is the ligament of Treitz. No small bowel wall thickening. No small bowel dilatation. The terminal ileum is normal. The appendix is normal. No gross colonic mass. No colonic wall thickening. Vascular/Lymphatic: There is abdominal aortic atherosclerosis without aneurysm. There is no  gastrohepatic or hepatoduodenal ligament lymphadenopathy. No intraperitoneal or retroperitoneal lymphadenopathy. No pelvic sidewall lymphadenopathy. Reproductive: The prostate gland and seminal vesicles are unremarkable. Other: No intraperitoneal free fluid. Musculoskeletal: Small left groin hernia contains only fat. No worrisome lytic or sclerotic osseous abnormality. IMPRESSION: 1. 1.7 x 0.7 x 0.8 cm stone in the left renal pelvis with mild left hydronephrosis. 2. Clustered stones versus single irregular stone in the lower pole left kidney. 3. Hepatic steatosis. 4.  Aortic Atherosclerois (ICD10-170.0) Electronically Signed   By: Kennith Center M.D.   On: 02/09/2019 16:59     Assessment & Plan:  Plan  I have discontinued Falon W. Udell's cyclobenzaprine and azithromycin. I am also having him maintain his atorvastatin, fenofibrate, lidocaine, metoprolol tartrate, methocarbamol, Insulin Lispro Prot & Lispro, Insulin Pen Needle, lisinopril, and fluticasone.  No orders of the defined types were placed in this encounter.   Problem List Items Addressed This Visit      Unprioritized   DM (diabetes mellitus) type II uncontrolled, periph vascular disorder (HCC)    F/u endo       Hyperlipidemia LDL goal <70    Tolerating statin, encouraged heart  healthy diet, avoid trans fats, minimize simple carbs and saturated fats. Increase exercise as tolerated con't lipitor and fenofibrate      Kidney stones    Strain urine  Refer to urology       Relevant Orders   Ambulatory referral to Urology    Other Visit Diagnoses    Acute low back pain, unspecified back pain laterality, unspecified whether sciatica present    -  Primary   Relevant Orders   POCT Urinalysis Dipstick (Automated) (Completed)   Abnormal urine findings       Relevant Orders   Urine Culture   Uncontrolled type 2 diabetes mellitus with hyperglycemia (HCC)       Relevant Orders   Microalbumin / creatinine urine ratio   Hemoglobin A1c   Hyperlipidemia associated with type 2 diabetes mellitus (HCC)       Relevant Orders   Lipid panel      Follow-up: Return if symptoms worsen or fail to improve.  Donato Schultz, DO

## 2020-04-13 ENCOUNTER — Other Ambulatory Visit: Payer: Self-pay | Admitting: Family Medicine

## 2020-04-13 DIAGNOSIS — E1165 Type 2 diabetes mellitus with hyperglycemia: Secondary | ICD-10-CM

## 2020-04-13 DIAGNOSIS — E1169 Type 2 diabetes mellitus with other specified complication: Secondary | ICD-10-CM

## 2020-04-13 DIAGNOSIS — E785 Hyperlipidemia, unspecified: Secondary | ICD-10-CM

## 2020-04-14 LAB — LIPID PANEL
Cholesterol: 242 mg/dL — ABNORMAL HIGH (ref <200)
HDL: 42 mg/dL (ref >=40)
LDL Cholesterol (Calc): 158 mg/dL (calc) — ABNORMAL HIGH
Non-HDL Cholesterol (Calc): 200 mg/dL (calc) — ABNORMAL HIGH (ref <130)
Total CHOL/HDL Ratio: 5.8 (calc) — ABNORMAL HIGH (ref <5.0)
Triglycerides: 255 mg/dL — ABNORMAL HIGH (ref <150)

## 2020-04-14 LAB — MICROALBUMIN / CREATININE URINE RATIO
Creatinine, Urine: 135 mg/dL (ref 20–320)
Microalb Creat Ratio: 23 mcg/mg creat (ref <30)
Microalb, Ur: 3.1 mg/dL

## 2020-04-14 LAB — URINE CULTURE
MICRO NUMBER:: 11101510
SPECIMEN QUALITY:: ADEQUATE

## 2020-04-14 LAB — HEMOGLOBIN A1C
Hgb A1c MFr Bld: 13.3 % of total Hgb — ABNORMAL HIGH (ref <5.7)
Mean Plasma Glucose: 335 (calc)
eAG (mmol/L): 18.6 (calc)

## 2020-04-15 ENCOUNTER — Other Ambulatory Visit: Payer: Self-pay

## 2020-04-15 MED ORDER — NITROFURANTOIN MONOHYD MACRO 100 MG PO CAPS: 100.0000 mg | ORAL_CAPSULE | Freq: Two times a day (BID) | ORAL | 0 refills | Status: DC

## 2020-08-03 ENCOUNTER — Other Ambulatory Visit: Payer: Self-pay | Admitting: Family Medicine

## 2020-08-03 DIAGNOSIS — I10 Essential (primary) hypertension: Secondary | ICD-10-CM

## 2020-08-24 ENCOUNTER — Other Ambulatory Visit: Payer: Self-pay

## 2020-08-24 ENCOUNTER — Encounter: Payer: Self-pay | Admitting: Family Medicine

## 2020-08-24 ENCOUNTER — Ambulatory Visit (INDEPENDENT_AMBULATORY_CARE_PROVIDER_SITE_OTHER): Payer: PRIVATE HEALTH INSURANCE | Admitting: Family Medicine

## 2020-08-24 VITALS — BP 134/76 | HR 75 | Temp 97.9°F | Resp 18 | Ht 70.0 in | Wt 261.8 lb

## 2020-08-24 DIAGNOSIS — E1169 Type 2 diabetes mellitus with other specified complication: Secondary | ICD-10-CM | POA: Diagnosis not present

## 2020-08-24 DIAGNOSIS — E1165 Type 2 diabetes mellitus with hyperglycemia: Secondary | ICD-10-CM | POA: Diagnosis not present

## 2020-08-24 DIAGNOSIS — I2510 Atherosclerotic heart disease of native coronary artery without angina pectoris: Secondary | ICD-10-CM

## 2020-08-24 DIAGNOSIS — E785 Hyperlipidemia, unspecified: Secondary | ICD-10-CM | POA: Diagnosis not present

## 2020-08-24 DIAGNOSIS — R350 Frequency of micturition: Secondary | ICD-10-CM

## 2020-08-24 DIAGNOSIS — R21 Rash and other nonspecific skin eruption: Secondary | ICD-10-CM

## 2020-08-24 DIAGNOSIS — I10 Essential (primary) hypertension: Secondary | ICD-10-CM

## 2020-08-24 DIAGNOSIS — E1151 Type 2 diabetes mellitus with diabetic peripheral angiopathy without gangrene: Secondary | ICD-10-CM

## 2020-08-24 DIAGNOSIS — IMO0002 Reserved for concepts with insufficient information to code with codable children: Secondary | ICD-10-CM

## 2020-08-24 DIAGNOSIS — R0683 Snoring: Secondary | ICD-10-CM

## 2020-08-24 HISTORY — DX: Frequency of micturition: R35.0

## 2020-08-24 HISTORY — DX: Snoring: R06.83

## 2020-08-24 HISTORY — DX: Rash and other nonspecific skin eruption: R21

## 2020-08-24 LAB — POC URINALSYSI DIPSTICK (AUTOMATED)
Bilirubin, UA: NEGATIVE
Glucose, UA: POSITIVE — AB
Ketones, UA: NEGATIVE
Leukocytes, UA: NEGATIVE
Nitrite, UA: NEGATIVE
Protein, UA: POSITIVE — AB
Spec Grav, UA: >=1.03 — AB (ref 1.010–1.025)
Urobilinogen, UA: 0.2 E.U./dL
pH, UA: 5 (ref 5.0–8.0)

## 2020-08-24 LAB — GLUCOSE, POCT (MANUAL RESULT ENTRY): POC Glucose: 243 mg/dl — AB (ref 70–99)

## 2020-08-24 MED ORDER — BETAMETHASONE DIPROPIONATE AUG 0.05 % EX CREA: TOPICAL_CREAM | Freq: Two times a day (BID) | CUTANEOUS | 0 refills | Status: DC

## 2020-08-24 NOTE — Assessment & Plan Note (Signed)
Pt has not had card f/u Requesting to see Dr Dulce Sellar Check labs today

## 2020-08-24 NOTE — Assessment & Plan Note (Signed)
B/l low ext Diprolene for no more than 2 weeks  F/u if no improvement

## 2020-08-24 NOTE — Patient Instructions (Addendum)

## 2020-08-24 NOTE — Assessment & Plan Note (Signed)
Will culture urine but most likely from uncontrolled DM  Check psa as well

## 2020-08-24 NOTE — Assessment & Plan Note (Signed)
Tolerating statin, encouraged heart healthy diet, avoid trans fats, minimize simple carbs and saturated fats. Increase exercise as tolerated 

## 2020-08-24 NOTE — Assessment & Plan Note (Signed)
Well controlled, no changes to meds. Encouraged heart healthy diet such as the DASH diet and exercise as tolerated.  °

## 2020-08-24 NOTE — Assessment & Plan Note (Signed)
Recheck labs today hgba1c to be checked , minimize simple carbs. Increase exercise as tolerated. Continue current meds F/u endo

## 2020-08-24 NOTE — Assessment & Plan Note (Signed)
Refer to pulm for sleep eval  

## 2020-08-24 NOTE — Progress Notes (Signed)
Patient ID: Dennis Hopkins, male    DOB: July 25, 1960  Age: 60 y.o. MRN: [id number]    Subjective:  Subjective  HPI Dennis Hopkins presents for office visit today. He complains of urinary frequency for several months, and the symptoms have steadily worsened. He denies taking any at home BP, he is taking 24 units of Insulin BID. His bp was 134/76 in the exam room. He reports having difficulty breathing when sleeping, but denies if he stops breathing at night. He denies feeling like he is gasping for air while lying down.   He endorses having dryness and itching on the skin of his anterior calf area. The patient has a PMHx of DM and kidney stone however, denies any symptoms today. He denies any chest pain, fever, N/V/D, chills, HA or testicular pain.    Review of Systems  Constitutional: Negative for chills, diaphoresis and fever.  HENT: Negative for congestion, rhinorrhea, sinus pain and sore throat.   Eyes: Negative for pain.  Respiratory: Positive for shortness of breath. Negative for cough and chest tightness.        Gasping at night and snoring   Cardiovascular: Negative for chest pain, palpitations and leg swelling.  Gastrointestinal: Negative for abdominal pain, diarrhea, nausea and vomiting.  Genitourinary: Positive for frequency. Negative for dysuria, flank pain, hematuria and testicular pain.  Musculoskeletal: Negative for back pain.  Skin: Positive for rash.  Neurological: Negative for light-headedness and headaches.    History Past Medical History:  Diagnosis Date  . Chickenpox   . Hyperlipemia   . Hypertension   . Kidney stones 2013    He has a past surgical history that includes Tonsillectomy; Cardiac catheterization (N/A, 01/07/2016); Coronary artery bypass graft (N/A, 01/13/2016); and TEE without cardioversion (N/A, 01/13/2016).   His family history includes Arthritis in his father and mother; Diabetes in his brother, father, and mother; Hyperlipidemia in his father.He  reports that he quit smoking about 6 years ago. His smoking use included cigarettes. He has a 70.00 pack-year smoking history. He has never used smokeless tobacco. He reports current alcohol use. He reports that he does not use drugs.  Current Outpatient Medications on File Prior to Visit  Medication Sig Dispense Refill  . lisinopril (ZESTRIL) 10 MG tablet Take 1 tablet (10 mg total) by mouth daily. 90 tablet 3  . atorvastatin (LIPITOR) 80 MG tablet Take 1 tablet (80 mg total) by mouth daily. 90 tablet 1  . fenofibrate 160 MG tablet Take 1 tablet (160 mg total) by mouth daily. (Patient not taking: No sig reported) 30 tablet 2  . fluticasone (FLONASE) 50 MCG/ACT nasal spray Place 2 sprays into both nostrils daily. 16 g 6  . Insulin Lispro Prot & Lispro (HUMALOG MIX 75/25 KWIKPEN) (75-25) 100 UNIT/ML Kwikpen Inject 24 Units into the skin in the morning and at bedtime. 30 mL 6  . Insulin Pen Needle 32G X 4 MM MISC 1 Device by Does not apply route in the morning and at bedtime. 100 each 6  . lidocaine (LIDODERM) 5 % Place 1 patch onto the skin daily. Remove & Discard patch within 12 hours or as directed by MD (Patient not taking: No sig reported) 30 patch 0  . methocarbamol (ROBAXIN) 500 MG tablet TAKE 1 TABLET BY MOUTH TWICE DAILY AS NEEDED FOR MUSCLE SPASM 45 tablet 1  . metoprolol tartrate (LOPRESSOR) 100 MG tablet Take 1 tablet (100 mg total) by mouth 2 (two) times daily. 180 tablet 1   No  current facility-administered medications on file prior to visit.     Objective:  Objective  Physical Exam Constitutional:      General: He is not in acute distress.Vital signs are normal.     Appearance: Normal appearance. He is well-developed and well-nourished. He is not ill-appearing.  HENT:     Head: Normocephalic and atraumatic.     Mouth/Throat:     Mouth: Oropharynx is clear and moist.  Eyes:     Extraocular Movements: EOM normal.     Pupils: Pupils are equal, round, and reactive to light.   Neck:     Thyroid: No thyromegaly.  Cardiovascular:     Rate and Rhythm: Normal rate and regular rhythm.     Heart sounds: No murmur heard.   Pulmonary:     Effort: Pulmonary effort is normal. No respiratory distress.     Breath sounds: Normal breath sounds. No wheezing or rales.  Chest:     Chest wall: No tenderness.  Musculoskeletal:        General: No tenderness or edema.     Cervical back: Normal range of motion and neck supple.  Skin:    General: Skin is warm.     Findings: Rash (erythematous and dry rash on bilaterally aspects of calfs) present.  Neurological:     Mental Status: He is alert and oriented to person, place, and time.  Psychiatric:        Mood and Affect: Mood and affect normal.        Behavior: Behavior normal.        Thought Content: Thought content normal.        Judgment: Judgment normal.    BP 134/76 (BP Location: Right Arm, Patient Position: Sitting, Cuff Size: Large)   Pulse 75   Temp 97.9 F (36.6 C) (Oral)   Resp 18   Ht 5\' 10"  (1.778 m)   Wt 261 lb 12.8 oz (118.8 kg)   SpO2 98%   BMI 37.56 kg/m  Wt Readings from Last 3 Encounters:  08/24/20 261 lb 12.8 oz (118.8 kg)  04/09/20 266 lb (120.7 kg)  12/02/19 260 lb 12.8 oz (118.3 kg)     Lab Results  Component Value Date   WBC 11.2 (H) 01/16/2016   HGB 10.2 (L) 01/16/2016   HCT 31.8 (L) 01/16/2016   PLT 189 01/16/2016   GLUCOSE 313 (H) 11/19/2019   CHOL 242 (H) 04/09/2020   TRIG 255 (H) 04/09/2020   HDL 42 04/09/2020   LDLDIRECT 173.0 11/19/2019   LDLCALC 158 (H) 04/09/2020   ALT 36 11/19/2019   AST 28 11/19/2019   NA 135 11/19/2019   K 4.0 11/19/2019   CL 98 11/19/2019   CREATININE 1.08 11/19/2019   BUN 14 11/19/2019   CO2 30 11/19/2019   TSH 1.54 09/29/2014   INR 1.32 01/13/2016   HGBA1C 13.3 (H) 04/09/2020   MICROALBUR 3.1 04/09/2020    CT Renal Stone Study  Result Date: 02/09/2019 CLINICAL DATA:  Left low back pain for 2 weeks. EXAM: CT ABDOMEN AND PELVIS WITHOUT  CONTRAST TECHNIQUE: Multidetector CT imaging of the abdomen and pelvis was performed following the standard protocol without IV contrast. COMPARISON:  08/12/2008 FINDINGS: Lower chest: Unremarkable. Hepatobiliary: The liver shows diffusely decreased attenuation suggesting fat deposition. There is no evidence for gallstones, gallbladder wall thickening, or pericholecystic fluid. No intrahepatic or extrahepatic biliary dilation. Pancreas: No focal mass lesion. No dilatation of the main duct. No intraparenchymal cyst. No peripancreatic edema. Spleen:  No splenomegaly. No focal mass lesion. Adrenals/Urinary Tract: No adrenal nodule or mass. Right kidney and ureter unremarkable. Clustered stones are a single irregular stone in the lower pole left kidney measures 4 x 11 x 9 mm. 1.7 x 0.7 x 0.8 cm stone is identified in the left renal pelvis with mild left hydronephrosis. No left ureteral stone. No bladder stone. Stomach/Bowel: Stomach is unremarkable. No gastric wall thickening. No evidence of outlet obstruction. Duodenum is normally positioned as is the ligament of Treitz. No small bowel wall thickening. No small bowel dilatation. The terminal ileum is normal. The appendix is normal. No gross colonic mass. No colonic wall thickening. Vascular/Lymphatic: There is abdominal aortic atherosclerosis without aneurysm. There is no gastrohepatic or hepatoduodenal ligament lymphadenopathy. No intraperitoneal or retroperitoneal lymphadenopathy. No pelvic sidewall lymphadenopathy. Reproductive: The prostate gland and seminal vesicles are unremarkable. Other: No intraperitoneal free fluid. Musculoskeletal: Small left groin hernia contains only fat. No worrisome lytic or sclerotic osseous abnormality. IMPRESSION: 1. 1.7 x 0.7 x 0.8 cm stone in the left renal pelvis with mild left hydronephrosis. 2. Clustered stones versus single irregular stone in the lower pole left kidney. 3. Hepatic steatosis. 4.  Aortic Atherosclerois  (ICD10-170.0) Electronically Signed   By: Kennith Center M.D.   On: 02/09/2019 16:59     Assessment & Plan:  Plan    Meds ordered this encounter  Medications  . augmented betamethasone dipropionate (DIPROLENE AF) 0.05 % cream    Sig: Apply topically 2 (two) times daily.    Dispense:  30 g    Refill:  0    Problem List Items Addressed This Visit      Unprioritized   Coronary artery disease    Pt has not had card f/u Requesting to see Dr Dulce Sellar Check labs today      Relevant Orders   Ambulatory referral to Cardiology   Diabetes [facility])   Relevant Orders   POCT glucose (manual entry) (Completed)   DM (diabetes mellitus) type II uncontrolled, periph vascular disorder (HCC)    Recheck labs today hgba1c to be checked , minimize simple carbs. Increase exercise as tolerated. Continue current meds F/u endo       HTN (hypertension)    Well controlled, no changes to meds. Encouraged heart healthy diet such as the DASH diet and exercise as tolerated.       Hyperlipidemia LDL goal <70    Tolerating statin, encouraged heart healthy diet, avoid trans fats, minimize simple carbs and saturated fats. Increase exercise as tolerated      Rash    B/l low ext Diprolene for no more than 2 weeks  F/u if no improvement       Relevant Medications   augmented betamethasone dipropionate (DIPROLENE AF) 0.05 % cream   Snoring    Refer to pulm for sleep eval       Relevant Orders   Ambulatory referral to Pulmonology   Urinary frequency - Primary    Will culture urine but most likely from uncontrolled DM  Check psa as well       Relevant Orders   POCT Urinalysis Dipstick (Automated) (Completed)   PSA   Urine Culture      Follow-up: Return in about 3 months (around 11/24/2020), or if symptoms worsen or fail to improve, for hypertension, hyperlipidemia.   I,Alexis Bryant,acting as a Neurosurgeon for [employer], DO.,have documented all relevant documentation on the behalf of  Donato Schultz, DO,as directed by  Donato Schultz, DO while in the presence of Donato Schultz, DO.  I, Donato Schultz, DO, have reviewed all documentation for this visit. The documentation on 08/24/20 for the exam, diagnosis, procedures, and orders are all accurate and complete.

## 2020-08-25 ENCOUNTER — Other Ambulatory Visit: Payer: Self-pay

## 2020-08-25 DIAGNOSIS — E785 Hyperlipidemia, unspecified: Secondary | ICD-10-CM

## 2020-08-25 DIAGNOSIS — E1169 Type 2 diabetes mellitus with other specified complication: Secondary | ICD-10-CM

## 2020-08-25 DIAGNOSIS — R809 Proteinuria, unspecified: Secondary | ICD-10-CM

## 2020-08-25 LAB — COMPREHENSIVE METABOLIC PANEL
ALT: 26 U/L (ref 0–53)
AST: 27 U/L (ref 0–37)
Albumin: 4.2 g/dL (ref 3.5–5.2)
Alkaline Phosphatase: 56 U/L (ref 39–117)
BUN: 21 mg/dL (ref 6–23)
CO2: 28 mEq/L (ref 19–32)
Calcium: 9.4 mg/dL (ref 8.4–10.5)
Chloride: 97 mEq/L (ref 96–112)
Creatinine, Ser: 1.06 mg/dL (ref 0.40–1.50)
GFR: 76.85 mL/min (ref >60.00)
Glucose, Bld: 243 mg/dL — ABNORMAL HIGH (ref 70–99)
Potassium: 4.1 mEq/L (ref 3.5–5.1)
Sodium: 135 mEq/L (ref 135–145)
Total Bilirubin: 0.7 mg/dL (ref 0.2–1.2)
Total Protein: 7.4 g/dL (ref 6.0–8.3)

## 2020-08-25 LAB — LIPID PANEL
Cholesterol: 253 mg/dL — ABNORMAL HIGH (ref 0–200)
HDL: 49.3 mg/dL (ref >39.00)
NonHDL: 203.53
Total CHOL/HDL Ratio: 5
Triglycerides: 234 mg/dL — ABNORMAL HIGH (ref 0.0–149.0)
VLDL: 46.8 mg/dL — ABNORMAL HIGH (ref 0.0–40.0)

## 2020-08-25 LAB — MICROALBUMIN / CREATININE URINE RATIO
Creatinine,U: 186.7 mg/dL
Microalb Creat Ratio: 5.4 mg/g (ref 0.0–30.0)
Microalb, Ur: 10.2 mg/dL — ABNORMAL HIGH (ref 0.0–1.9)

## 2020-08-25 LAB — HEMOGLOBIN A1C: Hgb A1c MFr Bld: 12.9 % — ABNORMAL HIGH (ref 4.6–6.5)

## 2020-08-25 LAB — URINE CULTURE
MICRO NUMBER:: 11615469
Result:: NO GROWTH
SPECIMEN QUALITY:: ADEQUATE

## 2020-08-25 LAB — LDL CHOLESTEROL, DIRECT: Direct LDL: 180 mg/dL

## 2020-08-25 LAB — PSA: PSA: 0.21 ng/mL (ref 0.10–4.00)

## 2020-08-25 MED ORDER — ATORVASTATIN CALCIUM 80 MG PO TABS: 80.0000 mg | ORAL_TABLET | Freq: Every day | ORAL | 1 refills | Status: DC

## 2020-08-25 MED ORDER — INSULIN LISPRO PROT & LISPRO (75-25 MIX) 100 UNIT/ML KWIKPEN: 30.0000 [IU] | PEN_INJECTOR | Freq: Two times a day (BID) | SUBCUTANEOUS | 6 refills | Status: DC

## 2020-09-04 DIAGNOSIS — B019 Varicella without complication: Secondary | ICD-10-CM | POA: Insufficient documentation

## 2020-09-04 DIAGNOSIS — I10 Essential (primary) hypertension: Secondary | ICD-10-CM | POA: Insufficient documentation

## 2020-09-04 DIAGNOSIS — E785 Hyperlipidemia, unspecified: Secondary | ICD-10-CM | POA: Insufficient documentation

## 2020-09-13 NOTE — Progress Notes (Signed)
Cardiology Office Note:    Date:  09/14/2020   ID:  Dennis Hopkins, DOB March 12, 1961, MRN [id number]  PCP:  Zola Button, Grayling Congress, DO  Cardiologist:  Norman Herrlich, MD   Referring MD: Zola Button, Grayling Congress, *  ASSESSMENT:    1. Coronary artery disease of native artery of native heart with stable angina pectoris (HCC)   2. S/P CABG x 4   3. Primary hypertension   4. Hyperlipidemia LDL goal <70    PLAN:    In order of problems listed above:  1. He has had bypass surgery has no recurrent angina that is uncontrolled risk factors of severe dyslipidemia poorly controlled diabetes.  I asked him to go back on antiplatelet therapy of clopidogrel he is intolerant of aspirin and resume the statin and if he does not tolerate rosuvastatin PCSK9 inhibitor.  He may require an ischemia evaluation will discuss at his next visit. 2. I am concerned that with his peripheral edema shortness of breath that he has evidence of heart failure check echocardiogram proBNP level initiate low-dose loop diuretic and follow-up in the office in 4 weeks 3. Initiate a different high intensity statin follow-up lipids at office visit 1 month  Next appointment 4 weeks   Medication Adjustments/Labs and Tests Ordered: Current medicines are reviewed at length with the patient today.  Concerns regarding medicines are outlined above.  No orders of the defined types were placed in this encounter.  No orders of the defined types were placed in this encounter.    Chief Complaint  Patient presents with  . Coronary Artery Disease    To establish cardiac care    History of Present Illness:    Dennis Hopkins is a 60 y.o. male who is being seen today to establish cardiology care at the request of Zola Button, Yvonne R, *.  H e.g.  E has a history of type 2 diabetes hypertension hyperlipidemia and CAD with previous He was last seen by cardiology 02/24/2016 Dr. Jens Som with a history of bypass surgery July 2017 with a left  thoracic artery anastomosed to LAD vein graft to marginal subsequent vein graft to the right coronary artery and PDA sequentially.  He also had mild 1 to 39% bilateral carotid artery stenosis.  He has a history of allergy to aspirin and was maintained on clopidogrel and a statin.  At that time his blood pressure is well controlled and he had arrangements for follow-up chest CT in June 2018 with a dilated thoracic aorta.  Chest CTA June 2017 showed a mild enlargement ascending aorta 39 mm.  The remainder of the thoracic aorta had normal caliber and calcific atherosclerosis.  He follows with endocrinology and his diabetes has been poorly controlled with most recent A1c of 13.6% he also has severe residual dyslipidemia with an LDL cholesterol of 180.  Component Ref Range & Units 3 wk ago 10 mo ago 1 yr ago 2 yr ago 3 yr ago 5 yr ago  Direct LDL mg/dL 174.0  814.4 CM  818.5 CM  169.0 CM  182.0 CM  161.0 CM    Component Ref Range & Units 3 wk ago 5 mo ago 10 mo ago 1 yr ago 2 yr ago 3 yr ago 4 yr ago  Hgb A1c MFr Bld 4.6 - 6.5 % 12.9High  13.3High R, CM  13.6High CM  12.1High CM  10.1High CM  10.2High CM  6.8High   Unfortunately he is not taking his statin he  has been intolerant of atorvastatin He is not taking antiplatelet therapy. Overall he is done well but he has noticed recently his weight is up perhaps 20 pounds in the last year he has edema and he short of breath at nighttime sleeping with his head elevated and wakes up short of breath at nighttime.  He also has mild exertional dyspnea during the day. He is not having angina chest pain palpitation or syncope. Past Medical History:  Diagnosis Date  . Chickenpox   . Hyperlipemia   . Hypertension   . Kidney stones 2013    Past Surgical History:  Procedure Laterality Date  . CARDIAC CATHETERIZATION N/A 01/07/2016   Procedure: Left Heart Cath and Coronary Angiography;  Surgeon: Corky Crafts, MD;  Location: [facility] INVASIVE CV LAB;   Service: Cardiovascular;  Laterality: N/A;  . CORONARY ARTERY BYPASS GRAFT N/A 01/13/2016   Procedure: CORONARY ARTERY BYPASS GRAFTING (CABG) x4 Endoscopic Harvesting of the Right Greater Saphenous Vein;  Surgeon: Loreli Slot, MD;  Location: [facility];  Service: Open Heart Surgery;  Laterality: N/A;  . TEE WITHOUT CARDIOVERSION N/A 01/13/2016   Procedure: TRANSESOPHAGEAL ECHOCARDIOGRAM (TEE);  Surgeon: Loreli Slot, MD;  Location: [facility] OR;  Service: Open Heart Surgery;  Laterality: N/A;  . TONSILLECTOMY      Current Medications: Current Meds  Medication Sig  . augmented betamethasone dipropionate (DIPROLENE AF) 0.05 % cream Apply topically 2 (two) times daily.  . Insulin Lispro Prot & Lispro (HUMALOG MIX 75/25 KWIKPEN) (75-25) 100 UNIT/ML Kwikpen Inject 30 Units into the skin in the morning and at bedtime.  . Insulin Pen Needle 32G X 4 MM MISC 1 Device by Does not apply route in the morning and at bedtime.  . methocarbamol (ROBAXIN) 500 MG tablet TAKE 1 TABLET BY MOUTH TWICE DAILY AS NEEDED FOR MUSCLE SPASM  . metoprolol tartrate (LOPRESSOR) 100 MG tablet Take 1 tablet (100 mg total) by mouth 2 (two) times daily.     Allergies:   Asa [aspirin]   Social History   Socioeconomic History  . Marital status: Married    Spouse name: Not on file  . Number of children: 1  . Years of education: Not on file  . Highest education level: Not on file  Occupational History  . Not on file  Tobacco Use  . Smoking status: Former Smoker    Packs/day: 2.00    Years: 35.00    Pack years: 70.00    Types: Cigarettes    Quit date: 12/21/2013    Years since quitting: 6.7  . Smokeless tobacco: Never Used  Vaping Use  . Vaping Use: Never used  Substance and Sexual Activity  . Alcohol use: Yes    Alcohol/week: 0.0 standard drinks    Comment: 1-2 drinks per night- wife reports he drinks more   . Drug use: No  . Sexual activity: Yes    Partners: Female  Other Topics Concern  . Not on file   Social History Narrative  . Not on file   Social Determinants of Health   Financial Resource Strain: Not on file  Food Insecurity: Not on file  Transportation Needs: Not on file  Physical Activity: Not on file  Stress: Not on file  Social Connections: Not on file     Family History: The patient's family history includes Arthritis in his father and mother; Diabetes in his brother, father, and mother; Hyperlipidemia in his father.  ROS:   ROS Please see the history of  present illness.    He has had a cough with clear sputum all other systems reviewed and are negative.  EKGs/Labs/Other Studies Reviewed:    The following studies were reviewed today:   EKG:  EKG is  ordered today.  The ekg ordered today is personally reviewed and demonstrates sinus rhythm ST-T abnormality ischemia lateral leads  Recent Labs: 08/24/2020: ALT 26; BUN 21; Creatinine, Ser 1.06; Potassium 4.1; Sodium 135  Recent Lipid Panel    Component Value Date/Time   CHOL 253 (H) 08/24/2020 1619   TRIG 234.0 (H) 08/24/2020 1619   HDL 49.30 08/24/2020 1619   CHOLHDL 5 08/24/2020 1619   VLDL 46.8 (H) 08/24/2020 1619   LDLCALC 158 (H) 04/09/2020 1627   LDLDIRECT 180.0 08/24/2020 1619    Physical Exam:    VS:  BP 138/72   Pulse 83   Ht 5\' 10"  (1.778 m)   Wt 264 lb (119.7 kg)   SpO2 98%   BMI 37.88 kg/m     Wt Readings from Last 3 Encounters:  09/14/20 264 lb (119.7 kg)  08/24/20 261 lb 12.8 oz (118.8 kg)  04/09/20 266 lb (120.7 kg)     GEN: Obese well nourished, well developed in no acute distress HEENT: Normal NECK: No JVD; No carotid bruits LYMPHATICS: No lymphadenopathy CARDIAC: RRR, no murmurs, rubs, gallops RESPIRATORY:  Clear to auscultation without rales, wheezing or rhonchi  ABDOMEN: Soft, non-tender, non-distended MUSCULOSKELETAL: 1-2+ bilateral lower extremity pitting edema; No deformity and stasis changes SKIN: Warm and dry NEUROLOGIC:  Alert and oriented x 3 PSYCHIATRIC:  Normal  affect     Signed, 04/11/20, MD  09/14/2020 2:49 PM    [facility]

## 2020-09-14 ENCOUNTER — Ambulatory Visit: Payer: PRIVATE HEALTH INSURANCE | Admitting: Cardiology

## 2020-09-14 ENCOUNTER — Encounter: Payer: Self-pay | Admitting: Cardiology

## 2020-09-14 ENCOUNTER — Other Ambulatory Visit: Payer: Self-pay

## 2020-09-14 VITALS — BP 138/72 | HR 83 | Ht 70.0 in | Wt 264.0 lb

## 2020-09-14 DIAGNOSIS — Z951 Presence of aortocoronary bypass graft: Secondary | ICD-10-CM

## 2020-09-14 DIAGNOSIS — E785 Hyperlipidemia, unspecified: Secondary | ICD-10-CM | POA: Diagnosis not present

## 2020-09-14 DIAGNOSIS — I10 Essential (primary) hypertension: Secondary | ICD-10-CM | POA: Diagnosis not present

## 2020-09-14 DIAGNOSIS — I25118 Atherosclerotic heart disease of native coronary artery with other forms of angina pectoris: Secondary | ICD-10-CM

## 2020-09-14 MED ORDER — FUROSEMIDE 20 MG PO TABS: 20.0000 mg | ORAL_TABLET | Freq: Every day | ORAL | 3 refills | Status: DC

## 2020-09-14 MED ORDER — CLOPIDOGREL BISULFATE 75 MG PO TABS: 75.0000 mg | ORAL_TABLET | Freq: Every day | ORAL | 3 refills | Status: DC

## 2020-09-14 MED ORDER — ROSUVASTATIN CALCIUM 20 MG PO TABS: 20.0000 mg | ORAL_TABLET | Freq: Every day | ORAL | 3 refills | Status: DC

## 2020-09-14 NOTE — Patient Instructions (Addendum)
Medication Instructions:  Your physician has recommended you make the following change in your medication:  START: Plavix 75 mg take one tablet by mouth daily.  START: Rosuvastatin 20 mg take one tablet by mouth daily.  START: Furosemide 20 mg take one tablet by mouth daily.  *If you need a refill on your cardiac medications before your next appointment, please call your pharmacy*   Lab Work: Your physician recommends that you return for lab work in: TODAY ProBNP, BMP If you have labs (blood work) drawn today and your tests are completely normal, you will receive your results only by: Marland Kitchen MyChart Message (if you have MyChart) OR . A paper copy in the mail If you have any lab test that is abnormal or we need to change your treatment, we will call you to review the results.   Testing/Procedures: Your physician has requested that you have an echocardiogram. Echocardiography is a painless test that uses sound waves to create images of your heart. It provides your doctor with information about the size and shape of your heart and how well your heart's chambers and valves are working. This procedure takes approximately one hour. There are no restrictions for this procedure.     Follow-Up: At [facility], you and your health needs are our priority.  As part of our continuing mission to provide you with exceptional heart care, we have created designated Provider Care Teams.  These Care Teams include your primary Cardiologist (physician) and Advanced Practice Providers (APPs -  Physician Assistants and Nurse Practitioners) who all work together to provide you with the care you need, when you need it.  We recommend signing up for the patient portal called "MyChart".  Sign up information is provided on this After Visit Summary.  MyChart is used to connect with patients for Virtual Visits (Telemedicine).  Patients are able to view lab/test results, encounter notes, upcoming appointments, etc.   Non-urgent messages can be sent to your provider as well.   To learn more about what you can do with MyChart, go to [email or web address].    Your next appointment:   4 week(s)  The format for your next appointment:   In Person  Provider:   Norman Herrlich, MD   Other Instructions

## 2020-09-15 ENCOUNTER — Telehealth: Payer: Self-pay

## 2020-09-15 LAB — BASIC METABOLIC PANEL
BUN/Creatinine Ratio: 17 (ref 9–20)
BUN: 19 mg/dL (ref 6–24)
CO2: 21 mmol/L (ref 20–29)
Calcium: 9.7 mg/dL (ref 8.7–10.2)
Chloride: 96 mmol/L (ref 96–106)
Creatinine, Ser: 1.12 mg/dL (ref 0.76–1.27)
Glucose: 364 mg/dL — ABNORMAL HIGH (ref 65–99)
Potassium: 4.4 mmol/L (ref 3.5–5.2)
Sodium: 136 mmol/L (ref 134–144)
eGFR: 76 mL/min/{1.73_m2} (ref >59)

## 2020-09-15 LAB — PRO B NATRIURETIC PEPTIDE: NT-Pro BNP: 32 pg/mL (ref 0–210)

## 2020-09-15 NOTE — Telephone Encounter (Signed)
Spoke with patient regarding results and recommendation.  Patient verbalizes understanding and is agreeable to plan of care. Advised patient to call back with any issues or concerns.  

## 2020-09-15 NOTE — Telephone Encounter (Signed)
-----   Message from Baldo Daub, MD sent at 09/15/2020  7:47 AM EDT ----- Stable result no changes

## 2020-09-23 LAB — BASIC METABOLIC PANEL
BUN: 24 — AB (ref 4–21)
CO2: 22 (ref 13–22)
Chloride: 98 — AB (ref 99–108)
Creatinine: 1.3 (ref 0.6–1.3)
Glucose: 294
Potassium: 4.4 (ref 3.4–5.3)
Sodium: 139 (ref 137–147)

## 2020-09-23 LAB — COMPREHENSIVE METABOLIC PANEL
Albumin: 4.3 (ref 3.5–5.0)
Calcium: 9.4 (ref 8.7–10.7)

## 2020-09-23 LAB — HIV ANTIBODY (ROUTINE TESTING W REFLEX): HIV 1&2 Ab, 4th Generation: NONREACTIVE

## 2020-09-25 ENCOUNTER — Other Ambulatory Visit: Payer: Self-pay | Admitting: Nephrology

## 2020-09-25 DIAGNOSIS — N182 Chronic kidney disease, stage 2 (mild): Secondary | ICD-10-CM

## 2020-10-08 ENCOUNTER — Other Ambulatory Visit: Payer: PRIVATE HEALTH INSURANCE

## 2020-10-21 ENCOUNTER — Ambulatory Visit (HOSPITAL_BASED_OUTPATIENT_CLINIC_OR_DEPARTMENT_OTHER)
Admission: RE | Admit: 2020-10-21 | Discharge: 2020-10-21 | Disposition: A | Discharge status: Home / Self Care | Payer: PRIVATE HEALTH INSURANCE | Source: Ambulatory Visit | Attending: Cardiology | Admitting: Cardiology

## 2020-10-21 ENCOUNTER — Other Ambulatory Visit (HOSPITAL_BASED_OUTPATIENT_CLINIC_OR_DEPARTMENT_OTHER): Payer: PRIVATE HEALTH INSURANCE

## 2020-10-21 ENCOUNTER — Other Ambulatory Visit: Payer: Self-pay

## 2020-10-21 DIAGNOSIS — I10 Essential (primary) hypertension: Secondary | ICD-10-CM | POA: Diagnosis present

## 2020-10-21 DIAGNOSIS — I25118 Atherosclerotic heart disease of native coronary artery with other forms of angina pectoris: Secondary | ICD-10-CM | POA: Insufficient documentation

## 2020-10-21 DIAGNOSIS — Z951 Presence of aortocoronary bypass graft: Secondary | ICD-10-CM | POA: Diagnosis not present

## 2020-10-21 DIAGNOSIS — E785 Hyperlipidemia, unspecified: Secondary | ICD-10-CM | POA: Diagnosis not present

## 2020-10-21 LAB — ECHOCARDIOGRAM COMPLETE
AR max vel: 2.35 cm2
AV Area VTI: 2.34 cm2
AV Area mean vel: 2.35 cm2
AV Mean grad: 4 mmHg
AV Peak grad: 7.8 mmHg
Ao pk vel: 1.4 m/s
Area-P 1/2: 2.71 cm2
Calc EF: 64.7 %
Single Plane A2C EF: 66.7 %
Single Plane A4C EF: 62.5 %

## 2020-10-21 MED ORDER — PERFLUTREN LIPID MICROSPHERE
1.0000 mL | INTRAVENOUS | Status: AC | PRN
  Administered 2020-10-21: 2 mL via INTRAVENOUS

## 2020-10-23 ENCOUNTER — Telehealth: Payer: Self-pay

## 2020-10-23 NOTE — Telephone Encounter (Signed)
-----   Message from Baldo Daub, MD sent at 10/23/2020  1:14 PM EDT ----- I reviewed, function EF% remains normal Continue his furosemide

## 2020-10-23 NOTE — Telephone Encounter (Signed)
Spoke with patient regarding results and recommendation.  Patient verbalizes understanding and is agreeable to plan of care. Advised patient to call back with any issues or concerns.  

## 2020-10-24 NOTE — Progress Notes (Deleted)
Cardiology Office Note:    Date:  10/24/2020   ID:  Dennis Hopkins, DOB 1960/08/02, MRN [id number]  PCP:  Zola Button, Grayling Congress, DO  Cardiologist:  Norman Herrlich, MD    Referring MD: Zola Button, Grayling Congress, *    ASSESSMENT:    No diagnosis found. PLAN:    In order of problems listed above:  1. ***   Next appointment: ***   Medication Adjustments/Labs and Tests Ordered: Current medicines are reviewed at length with the patient today.  Concerns regarding medicines are outlined above.  No orders of the defined types were placed in this encounter.  No orders of the defined types were placed in this encounter.   No chief complaint on file.   History of Present Illness:    Dennis Hopkins is a 60 y.o. male with a hx of CAD with CABG July 2017, mild enlargement ascending aorta, mild bilateral carotid artery stenosis, aspirin allergy, hypertension hyperlipidemia and type 2 diabetes.  He was last seen 09/14/2020 to reestablish cardiology care and was noted to have peripheral edema shortness of breath and was felt to have congestive heart failure and was initiated on a loop diuretic.  He also had poorly controlled hypertension and was restarted on a statin.  His proBNP level was quite low at 32 creatinine normal 1.1 to an echocardiogram showed normal EF 55 to 60% diastolic function described as pseudo normal but I reviewed the echo myself and there was impaired relaxation with indeterminate filling pressure.. Compliance with diet, lifestyle and medications: *** Past Medical History:  Diagnosis Date  . Chickenpox   . Hyperlipemia   . Hypertension   . Kidney stones 2013    Past Surgical History:  Procedure Laterality Date  . CARDIAC CATHETERIZATION N/A 01/07/2016   Procedure: Left Heart Cath and Coronary Angiography;  Surgeon: Corky Crafts, MD;  Location: [facility] INVASIVE CV LAB;  Service: Cardiovascular;  Laterality: N/A;  . CORONARY ARTERY BYPASS GRAFT N/A 01/13/2016    Procedure: CORONARY ARTERY BYPASS GRAFTING (CABG) x4 Endoscopic Harvesting of the Right Greater Saphenous Vein;  Surgeon: Loreli Slot, MD;  Location: [facility] OR;  Service: Open Heart Surgery;  Laterality: N/A;  . TEE WITHOUT CARDIOVERSION N/A 01/13/2016   Procedure: TRANSESOPHAGEAL ECHOCARDIOGRAM (TEE);  Surgeon: Loreli Slot, MD;  Location: [facility] OR;  Service: Open Heart Surgery;  Laterality: N/A;  . TONSILLECTOMY      Current Medications: No outpatient medications have been marked as taking for the 10/26/20 encounter (Appointment) with Baldo Daub, MD.     Allergies:   Jonne Ply [aspirin]   Social History   Socioeconomic History  . Marital status: Married    Spouse name: Not on file  . Number of children: 1  . Years of education: Not on file  . Highest education level: Not on file  Occupational History  . Not on file  Tobacco Use  . Smoking status: Former Smoker    Packs/day: 2.00    Years: 35.00    Pack years: 70.00    Types: Cigarettes    Quit date: 12/21/2013    Years since quitting: 6.8  . Smokeless tobacco: Never Used  Vaping Use  . Vaping Use: Never used  Substance and Sexual Activity  . Alcohol use: Yes    Alcohol/week: 0.0 standard drinks    Comment: 1-2 drinks per night- wife reports he drinks more   . Drug use: No  . Sexual activity: Yes    Partners: Female  Other Topics Concern  . Not on file  Social History Narrative  . Not on file   Social Determinants of Health   Financial Resource Strain: Not on file  Food Insecurity: Not on file  Transportation Needs: Not on file  Physical Activity: Not on file  Stress: Not on file  Social Connections: Not on file     Family History: The patient's ***family history includes Arthritis in his father and mother; Diabetes in his brother, father, and mother; Hyperlipidemia in his father. ROS:   Please see the history of present illness.    All other systems reviewed and are negative.  EKGs/Labs/Other Studies  Reviewed:    The following studies were reviewed today:  EKG:  EKG ordered today and personally reviewed.  The ekg ordered today demonstrates ***  Recent Labs: 08/24/2020: ALT 26 09/14/2020: NT-Pro BNP 32 09/23/2020: BUN 24; Creatinine 1.3; Potassium 4.4; Sodium 139  Recent Lipid Panel    Component Value Date/Time   CHOL 253 (H) 08/24/2020 1619   TRIG 234.0 (H) 08/24/2020 1619   HDL 49.30 08/24/2020 1619   CHOLHDL 5 08/24/2020 1619   VLDL 46.8 (H) 08/24/2020 1619   LDLCALC 158 (H) 04/09/2020 1627   LDLDIRECT 180.0 08/24/2020 1619    Physical Exam:    VS:  There were no vitals taken for this visit.    Wt Readings from Last 3 Encounters:  09/14/20 264 lb (119.7 kg)  08/24/20 261 lb 12.8 oz (118.8 kg)  04/09/20 266 lb (120.7 kg)     GEN: *** Well nourished, well developed in no acute distress HEENT: Normal NECK: No JVD; No carotid bruits LYMPHATICS: No lymphadenopathy CARDIAC: ***RRR, no murmurs, rubs, gallops RESPIRATORY:  Clear to auscultation without rales, wheezing or rhonchi  ABDOMEN: Soft, non-tender, non-distended MUSCULOSKELETAL:  No edema; No deformity  SKIN: Warm and dry NEUROLOGIC:  Alert and oriented x 3 PSYCHIATRIC:  Normal affect    Signed, Norman Herrlich, MD  10/24/2020 12:11 PM    [facility]

## 2020-10-26 ENCOUNTER — Ambulatory Visit: Payer: PRIVATE HEALTH INSURANCE | Admitting: Cardiology

## 2020-11-05 ENCOUNTER — Other Ambulatory Visit: Payer: PRIVATE HEALTH INSURANCE

## 2020-11-28 ENCOUNTER — Other Ambulatory Visit: Payer: Self-pay | Admitting: Internal Medicine

## 2021-01-12 ENCOUNTER — Encounter: Payer: Self-pay | Admitting: Family Medicine

## 2021-01-12 ENCOUNTER — Other Ambulatory Visit: Payer: Self-pay

## 2021-01-12 ENCOUNTER — Ambulatory Visit (INDEPENDENT_AMBULATORY_CARE_PROVIDER_SITE_OTHER): Payer: PRIVATE HEALTH INSURANCE | Admitting: Family Medicine

## 2021-01-12 VITALS — BP 134/82 | HR 83 | Temp 98.1°F | Ht 70.0 in | Wt 262.1 lb

## 2021-01-12 DIAGNOSIS — B356 Tinea cruris: Secondary | ICD-10-CM

## 2021-01-12 MED ORDER — CLOTRIMAZOLE-BETAMETHASONE 1-0.05 % EX CREA: 1.0000 "application " | TOPICAL_CREAM | Freq: Two times a day (BID) | CUTANEOUS | 0 refills | Status: DC

## 2021-01-12 NOTE — Progress Notes (Signed)
Chief Complaint  Patient presents with   Rash    On going rash on both hips    Dennis Hopkins is a 60 y.o. male here for a skin complaint.  Duration: 2 years off and on; this episode lasted for 1 week Location: both hips Pruritic? Yes Painful? Yes Drainage? No New soaps/lotions/topicals/detergents? No Sick contacts? No Other associated symptoms: stays in same area; no fevers Therapies tried thus far: OTC cortisone cream, rx cream in past did not help  Past Medical History:  Diagnosis Date   Chickenpox    Hyperlipemia    Hypertension    Kidney stones 2013    BP 134/82   Pulse 83   Temp 98.1 F (36.7 C) (Oral)   Ht 5\' 10"  (1.778 m)   Wt 262 lb 2 oz (118.9 kg)   SpO2 97%   BMI 37.61 kg/m  Gen: awake, alert, appearing stated age Lungs: No accessory muscle use Skin: Over the medial gluteal region and between the gluteal cleft, there is a patch of scaly and erythematous skin with a darker border and centralized clearing.  There are areas of excoriation.  No drainage, TTP, fluctuance Psych: Age appropriate judgment and insight  Tinea cruris - Plan: clotrimazole-betamethasone (LOTRISONE) cream  This looks fungal and he has failed creams in the past.  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  He will do this twice daily for 2 weeks and let me know if things are not getting better.  Would consider biopsy versus referral to dermatology. F/u prn. The patient voiced understanding and agreement to the plan.  Lester, DO 01/12/21 4:04 PM

## 2021-01-12 NOTE — Patient Instructions (Signed)
Try not to scratch.  Avoid scented products while dealing with this.  Ice/cold pack over area for 10-15 min twice daily.  Let us know if you need anything.

## 2021-02-15 NOTE — Progress Notes (Addendum)
[facility] [address], [address] [place], [place] [address] [phone]  Date:  02/17/2021   Name:  Dennis Hopkins   DOB:  12-11-1960   MRN:  [id number]  PCP:  Dennis Schultz, DO    Chief Complaint: Foot Swelling (Bilateral foot pain and swelling, pain radiating down left leg)   History of Present Illness:  Dennis Hopkins is a 60 y.o. very pleasant male patient who presents with the following:  Primary patient of my partner Dennis. Laury Axon, here today with concern of foot and leg pain and swelling I have not seen this patient myself previously He was seen by Dennis Hopkins last month with tinea infection   History of CAD status post CABG, obesity, hypertension, hyperlipidemia, poorly controlled diabetes  He has noticed foot swelling and pain for about 6 months He got larger shoes for better comfort The swelling get worse as the day goes on  He does have some numbness and burning in his feet as well He will tend to drink alcohol at night to reduce the sx for sleep   He recently had an echocardiogram, in May of this year.  Showed normal EF and mild diastolic dysfunction Weight is stable, doubt swelling is due to CHF  Wt Readings from Last 3 Encounters:  02/17/21 261 lb (118.4 kg)  01/12/21 262 lb 2 oz (118.9 kg)  09/14/20 264 lb (119.7 kg)      Lab Results  Component Value Date   HGBA1C 12.9 (H) 08/24/2020    Patient Active Problem List   Diagnosis Date Noted   Hypertension    Hyperlipemia    Chickenpox    Urinary frequency 08/24/2020   Snoring 08/24/2020   Rash 08/24/2020   Kidney stones 02/14/2019   Acute left-sided low back pain with right-sided sciatica 02/14/2019   DM (diabetes mellitus) type II uncontrolled, periph vascular disorder (HCC) 07/19/2016   Diabetes (HCC) 01/18/2016   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   Coronary artery disease 01/12/2016   Exertional angina (HCC)    Chest pain 11/11/2015   HTN  (hypertension) 10/20/2015   Hyperlipidemia LDL goal <70 10/20/2015   Preventative health care 09/29/2014   Obesity (BMI 30-39.9) 09/26/2013   Tobacco use disorder 09/26/2013    Past Medical History:  Diagnosis Date   Chickenpox    Hyperlipemia    Hypertension    Kidney stones 2013    Past Surgical History:  Procedure Laterality Date   CARDIAC CATHETERIZATION N/A 01/07/2016   Procedure: Left Heart Cath and Coronary Angiography;  Surgeon: Corky Crafts, MD;  Location: [facility] INVASIVE CV LAB;  Service: Cardiovascular;  Laterality: N/A;   CORONARY ARTERY BYPASS GRAFT N/A 01/13/2016   Procedure: CORONARY ARTERY BYPASS GRAFTING (CABG) x4 Endoscopic Harvesting of the Right Greater Saphenous Vein;  Surgeon: Loreli Slot, MD;  Location: [facility] OR;  Service: Open Heart Surgery;  Laterality: N/A;   TEE WITHOUT CARDIOVERSION N/A 01/13/2016   Procedure: TRANSESOPHAGEAL ECHOCARDIOGRAM (TEE);  Surgeon: Loreli Slot, MD;  Location: [facility] OR;  Service: Open Heart Surgery;  Laterality: N/A;   TONSILLECTOMY      Social History   Tobacco Use   Smoking status: Former    Packs/day: 2.00    Years: 35.00    Pack years: 70.00    Types: Cigarettes    Quit date: 12/21/2013    Years since quitting: 7.1   Smokeless tobacco: Never  Vaping  Use   Vaping Use: Never used  Substance Use Topics   Alcohol use: Yes    Alcohol/week: 0.0 standard drinks    Comment: 1-2 drinks per night- wife reports he drinks more    Drug use: No    Family History  Problem Relation Age of Onset   Arthritis Mother    Diabetes Mother    Arthritis Father    Hyperlipidemia Father    Diabetes Father    Diabetes Brother     Allergies  Allergen Reactions   Asa [Aspirin] Anaphylaxis and Swelling    Lips swelling    Medication list has been reviewed and updated.  Current Outpatient Medications on File Prior to Visit  Medication Sig Dispense Refill   augmented betamethasone dipropionate (DIPROLENE AF) 0.05 %  cream Apply topically 2 (two) times daily. 30 g 0   clopidogrel (PLAVIX) 75 MG tablet Take 1 tablet (75 mg total) by mouth daily. 90 tablet 3   clotrimazole-betamethasone (LOTRISONE) cream Apply 1 application topically 2 (two) times daily. 30 g 0   HUMALOG MIX 75/25 KWIKPEN (75-25) 100 UNIT/ML Kwikpen INJECT 24 UNITS SUBCUTANEOUSLY IN THE MORNING AND AT BEDTIME 15 mL 0   Insulin Pen Needle 32G X 4 MM MISC 1 Device by Does not apply route in the morning and at bedtime. 100 each 6   lidocaine (LIDODERM) 5 % Place 1 patch onto the skin daily. Remove & Discard patch within 12 hours or as directed by MD 30 patch 0   methocarbamol (ROBAXIN) 500 MG tablet TAKE 1 TABLET BY MOUTH TWICE DAILY AS NEEDED FOR MUSCLE SPASM 45 tablet 1   metoprolol tartrate (LOPRESSOR) 100 MG tablet Take 1 tablet (100 mg total) by mouth 2 (two) times daily. 180 tablet 1   furosemide (LASIX) 20 MG tablet Take 1 tablet (20 mg total) by mouth daily. 90 tablet 3   rosuvastatin (CRESTOR) 20 MG tablet Take 1 tablet (20 mg total) by mouth daily. 90 tablet 3   No current facility-administered medications on file prior to visit.    Review of Systems:  As per HPI- otherwise negative.   Physical Examination: Vitals:   02/17/21 1536  BP: (!) 160/80  Pulse: 85  Resp: 19  Temp: 98.1 F (36.7 C)  SpO2: 99%   Vitals:   02/17/21 1536  Weight: 261 lb (118.4 kg)  Height: 5\' 10"  (1.778 m)   Body mass index is 37.45 kg/m. Ideal Body Weight: Weight in (lb) to have BMI = 25: 173.9  GEN: no acute distress.  Obese, appears well HEENT: Atraumatic, Normocephalic.  Ears and Nose: No external deformity. CV: RRR, No M/G/R. No JVD. No thrill. No extra heart sounds. PULM: CTA B, no wheezes, crackles, rhonchi. No retractions. No resp. distress. No accessory muscle use..diga  EXTR: No c/c/e PSYCH: Normally interactive. Conversant.  Feet are warm and well perfused.  He is not able to sense about 50% of monofilament sites  Pulses are  normal   BP Readings from Last 3 Encounters:  02/17/21 (!) 160/80  01/12/21 134/82  09/14/20 138/72    Assessment and Plan: DM (diabetes mellitus) type II uncontrolled, periph vascular disorder (HCC) - Plan: Comprehensive metabolic panel, Hemoglobin A1c, gabapentin (NEURONTIN) 300 MG capsule  Coronary artery disease involving native coronary artery of native heart without angina pectoris - Plan: CBC, Comprehensive metabolic panel  Type 2 diabetes mellitus with diabetic neuropathy, with long-term current use of insulin (HCC) - Plan: Hemoglobin A1c, Vitamin B12, Ferritin, Folate, TSH  Primary hypertension  Patient seen today for concern of foot numbness and pain, most likely neuropathy due to diabetes.  I have explained the situation to him, and advised that his symptoms should improve with better control of his blood sugars.  We will check his A1c today and I will get back with him  In the meantime we will also check for any possible nutrient deficit which could contribute, we will have him start on gabapentin.  I have cautioned him against taking gabapentin if he has had more than 1 alcoholic beverage  Blood pressure is elevated today, but has been normal at his last 2 office visits.  For the time being we will not change medications This visit occurred during the SARS-CoV-2 public health emergency.  Safety protocols were in place, including screening questions prior to the visit, additional usage of staff PPE, and extensive cleaning of exam room while observing appropriate contact time as indicated for disinfecting solutions.   Signed Dennis Amsterdam, MD  Received his labs as below, 9/1- called pt Labs are ok except for glucose which is very high  He using his insulin 30 u twice a day when he remembers or is able to do it- not all the time He is actually seeing endocrinology - Dennis Hopkins- but has not been seen in "a long time" - looks like last visit was in June of 21 I will reach  out to Dennis Hopkins and see if they can get him scheduled for a visit  Results for orders placed or performed in visit on 02/17/21  CBC  Result Value Ref Range   WBC 7.1 4.0 - 10.5 K/uL   RBC 4.87 4.22 - 5.81 Mil/uL   Platelets 185.0 150.0 - 400.0 K/uL   Hemoglobin 14.0 13.0 - 17.0 g/dL   HCT 94.1 74.0 - 81.4 %   MCV 86.9 78.0 - 100.0 fl   MCHC 33.1 30.0 - 36.0 g/dL   RDW 48.1 85.6 - 31.4 %  Comprehensive metabolic panel  Result Value Ref Range   Sodium 134 (L) 135 - 145 mEq/L   Potassium 4.2 3.5 - 5.1 mEq/L   Chloride 95 (L) 96 - 112 mEq/L   CO2 28 19 - 32 mEq/L   Glucose, Bld 395 (H) 70 - 99 mg/dL   BUN 17 6 - 23 mg/dL   Creatinine, Ser 9.70 0.40 - 1.50 mg/dL   Total Bilirubin 0.5 0.2 - 1.2 mg/dL   Alkaline Phosphatase 58 39 - 117 U/L   AST 23 0 - 37 U/L   ALT 28 0 - 53 U/L   Total Protein 7.4 6.0 - 8.3 g/dL   Albumin 4.2 3.5 - 5.2 g/dL   GFR 26.37 >85.88 mL/min   Calcium 9.7 8.4 - 10.5 mg/dL  Hemoglobin F0Y  Result Value Ref Range   Hgb A1c MFr Bld 13.4 (H) 4.6 - 6.5 %  Vitamin B12  Result Value Ref Range   Vitamin B-12 322 211 - 911 pg/mL  Ferritin  Result Value Ref Range   Ferritin 191.2 22.0 - 322.0 ng/mL  Folate  Result Value Ref Range   Folate 11.6 >5.9 ng/mL  TSH  Result Value Ref Range   TSH 2.04 0.35 - 5.50 uIU/mL

## 2021-02-17 ENCOUNTER — Ambulatory Visit (INDEPENDENT_AMBULATORY_CARE_PROVIDER_SITE_OTHER): Payer: PRIVATE HEALTH INSURANCE | Admitting: Family Medicine

## 2021-02-17 ENCOUNTER — Other Ambulatory Visit: Payer: Self-pay

## 2021-02-17 VITALS — BP 160/80 | HR 85 | Temp 98.1°F | Resp 19 | Ht 70.0 in | Wt 261.0 lb

## 2021-02-17 DIAGNOSIS — E1140 Type 2 diabetes mellitus with diabetic neuropathy, unspecified: Secondary | ICD-10-CM | POA: Diagnosis not present

## 2021-02-17 DIAGNOSIS — I10 Essential (primary) hypertension: Secondary | ICD-10-CM | POA: Diagnosis not present

## 2021-02-17 DIAGNOSIS — E1151 Type 2 diabetes mellitus with diabetic peripheral angiopathy without gangrene: Secondary | ICD-10-CM | POA: Diagnosis not present

## 2021-02-17 DIAGNOSIS — E1165 Type 2 diabetes mellitus with hyperglycemia: Secondary | ICD-10-CM | POA: Diagnosis not present

## 2021-02-17 DIAGNOSIS — Z794 Long term (current) use of insulin: Secondary | ICD-10-CM

## 2021-02-17 DIAGNOSIS — I2510 Atherosclerotic heart disease of native coronary artery without angina pectoris: Secondary | ICD-10-CM

## 2021-02-17 DIAGNOSIS — IMO0002 Reserved for concepts with insufficient information to code with codable children: Secondary | ICD-10-CM

## 2021-02-17 MED ORDER — GABAPENTIN 300 MG PO CAPS: 300.0000 mg | ORAL_CAPSULE | Freq: Three times a day (TID) | ORAL | 2 refills | Status: DC

## 2021-02-17 NOTE — Patient Instructions (Signed)
Good to see you today- I am sorry that your feet are hurting.  I think you have neuropathy - nerve damage- due to diabetes.  Getting your blood sugars under better control can help!  I will be in touch with your A1c We can also use gabapentin for nerve pain- start with one at bedtime- you can gradually increase to taking it three times a day over a week or so if needed.  As we discussed, do not use this medication if you are drinking a lot that day  We will make sure no deficiency such as B12 or iron, which can also cause neuropathy symptoms

## 2021-02-18 LAB — COMPREHENSIVE METABOLIC PANEL
ALT: 28 U/L (ref 0–53)
AST: 23 U/L (ref 0–37)
Albumin: 4.2 g/dL (ref 3.5–5.2)
Alkaline Phosphatase: 58 U/L (ref 39–117)
BUN: 17 mg/dL (ref 6–23)
CO2: 28 mEq/L (ref 19–32)
Calcium: 9.7 mg/dL (ref 8.4–10.5)
Chloride: 95 mEq/L — ABNORMAL LOW (ref 96–112)
Creatinine, Ser: 1.19 mg/dL (ref 0.40–1.50)
GFR: 66.66 mL/min (ref >60.00)
Glucose, Bld: 395 mg/dL — ABNORMAL HIGH (ref 70–99)
Potassium: 4.2 mEq/L (ref 3.5–5.1)
Sodium: 134 mEq/L — ABNORMAL LOW (ref 135–145)
Total Bilirubin: 0.5 mg/dL (ref 0.2–1.2)
Total Protein: 7.4 g/dL (ref 6.0–8.3)

## 2021-02-18 LAB — CBC
HCT: 42.3 % (ref 39.0–52.0)
Hemoglobin: 14 g/dL (ref 13.0–17.0)
MCHC: 33.1 g/dL (ref 30.0–36.0)
MCV: 86.9 fl (ref 78.0–100.0)
Platelets: 185 10*3/uL (ref 150.0–400.0)
RBC: 4.87 Mil/uL (ref 4.22–5.81)
RDW: 13.4 % (ref 11.5–15.5)
WBC: 7.1 10*3/uL (ref 4.0–10.5)

## 2021-02-18 LAB — FOLATE: Folate: 11.6 ng/mL (ref >5.9)

## 2021-02-18 LAB — VITAMIN B12: Vitamin B-12: 322 pg/mL (ref 211–911)

## 2021-02-18 LAB — TSH: TSH: 2.04 u[IU]/mL (ref 0.35–5.50)

## 2021-02-18 LAB — FERRITIN: Ferritin: 191.2 ng/mL (ref 22.0–322.0)

## 2021-02-18 LAB — HEMOGLOBIN A1C: Hgb A1c MFr Bld: 13.4 % — ABNORMAL HIGH (ref 4.6–6.5)

## 2021-03-26 ENCOUNTER — Other Ambulatory Visit: Payer: Self-pay | Admitting: Internal Medicine

## 2021-03-26 NOTE — Telephone Encounter (Signed)
Pt calling to refill HUMALOG MIX 75/25 KWIKPEN (75-25) 100 UNIT/ML Kwikpen  To [facility] in [place] [address] Pt contact [phone]

## 2021-03-27 ENCOUNTER — Other Ambulatory Visit: Payer: Self-pay | Admitting: Family Medicine

## 2021-03-27 DIAGNOSIS — B356 Tinea cruris: Secondary | ICD-10-CM

## 2021-04-06 ENCOUNTER — Ambulatory Visit: Payer: PRIVATE HEALTH INSURANCE | Admitting: Internal Medicine

## 2021-04-09 ENCOUNTER — Telehealth: Payer: Self-pay | Admitting: Family Medicine

## 2021-04-09 NOTE — Telephone Encounter (Signed)
Medication: clotrimazole-betamethasone (LOTRISONE) cream [[id number]]    Has the patient contacted their pharmacy? Yes.   (If no, request that the patient contact the pharmacy for the refill.) (If yes, when and what did the pharmacy advise?)    Preferred Pharmacy (with phone number or street name): [facility] [address]    Agent: Please be advised that RX refills may take up to 3 business days. We ask that you follow-up with your pharmacy.

## 2021-04-09 NOTE — Telephone Encounter (Signed)
Refill sent.

## 2021-04-13 ENCOUNTER — Other Ambulatory Visit: Payer: Self-pay

## 2021-04-13 ENCOUNTER — Encounter: Payer: Self-pay | Admitting: Internal Medicine

## 2021-04-13 ENCOUNTER — Ambulatory Visit (INDEPENDENT_AMBULATORY_CARE_PROVIDER_SITE_OTHER): Payer: PRIVATE HEALTH INSURANCE | Admitting: Internal Medicine

## 2021-04-13 VITALS — BP 142/84 | HR 68 | Ht 70.0 in | Wt 259.2 lb

## 2021-04-13 DIAGNOSIS — E1140 Type 2 diabetes mellitus with diabetic neuropathy, unspecified: Secondary | ICD-10-CM | POA: Diagnosis not present

## 2021-04-13 DIAGNOSIS — E119 Type 2 diabetes mellitus without complications: Secondary | ICD-10-CM

## 2021-04-13 DIAGNOSIS — Z794 Long term (current) use of insulin: Secondary | ICD-10-CM | POA: Diagnosis not present

## 2021-04-13 DIAGNOSIS — E1165 Type 2 diabetes mellitus with hyperglycemia: Secondary | ICD-10-CM

## 2021-04-13 DIAGNOSIS — E1159 Type 2 diabetes mellitus with other circulatory complications: Secondary | ICD-10-CM

## 2021-04-13 HISTORY — DX: Type 2 diabetes mellitus without complications: E11.9

## 2021-04-13 HISTORY — DX: Type 2 diabetes mellitus with hyperglycemia: E11.65

## 2021-04-13 LAB — GLUCOSE, POCT (MANUAL RESULT ENTRY): POC Glucose: 402 mg/dl — AB (ref 70–99)

## 2021-04-13 MED ORDER — INSULIN LISPRO (1 UNIT DIAL) 100 UNIT/ML (KWIKPEN): 8.0000 [IU] | PEN_INJECTOR | Freq: Three times a day (TID) | SUBCUTANEOUS | 2 refills | Status: DC

## 2021-04-13 MED ORDER — LANTUS SOLOSTAR 100 UNIT/ML ~~LOC~~ SOPN: 40.0000 [IU] | PEN_INJECTOR | Freq: Every day | SUBCUTANEOUS | 6 refills | Status: DC

## 2021-04-13 MED ORDER — INSULIN PEN NEEDLE 32G X 4 MM MISC: 1.0000 | Freq: Four times a day (QID) | 2 refills | Status: AC

## 2021-04-13 MED ORDER — DAPAGLIFLOZIN PROPANEDIOL 5 MG PO TABS: 5.0000 mg | ORAL_TABLET | Freq: Every day | ORAL | 2 refills | Status: DC

## 2021-04-13 MED ORDER — FREESTYLE LIBRE 2 SENSOR MISC: 1.0000 | 6 refills | Status: DC

## 2021-04-13 NOTE — Progress Notes (Signed)
Name: Dennis Hopkins  Age/ Sex: 60 y.o., male   MRN/ DOB: [id number], [birth year]     PCP: Donato Schultz, DO   Reason for Endocrinology Evaluation: Type 2 Diabetes Mellitus  Initial Endocrine Consultative Visit: 11/08/2019    PATIENT IDENTIFIER: Dennis Hopkins is a 60 y.o. male with a past medical history of T2DM, HTN, CAD (S/P CABG in 2017) and Dyslipidemia.  The patient has followed with Endocrinology clinic since 11/08/2019 for consultative assistance with management of his diabetes.  DIABETIC HISTORY:  [name] was diagnosed with DM in 2017, has been on metformin in the past pt attributes flank pain to metformin. His hemoglobin A1c has ranged from 6.8% in 2017, peaking at 12.1% in 2020.  On his initial visit to our clinic his A1c was 12.1% . He had a prescription for Januvia but was not taking.  Pt declined restarting metformin. We started Glipizide.    By 11/2019 we stopped Glipizide and started insulin mix  SUBJECTIVE:   During the last visit (11/27/2019): A1c 13.6% . We stopped Glipizide, Januvia and started insulin mix     Today (04/13/2021): Dennis Hopkins is here for a follow up on diabetes. He has not been to our clinic in 16 months.  He checks his blood sugars 0 times daily.  He eats 3 meals a day, does not snack.  Drinks sugar-sweetened beverages  He has LE edema on Furosemide     HOME DIABETES REGIMEN:  HUmalog MIx 24 units BID- takes 30 units      Statin: Yes ACE-I/ARB: Yes   METER DOWNLOAD SUMMARY:does not check    DIABETIC COMPLICATIONS: Microvascular complications:    Denies: CKD, neuropathy  Last eye exam: Completed long time ago    Macrovascular complications:  CAD (S/P CABG 2019) Denies: PVD, CVA  HISTORY:  Past Medical History:  Past Medical History:  Diagnosis Date   Chickenpox    Hyperlipemia    Hypertension    Kidney stones 2013   Past  Surgical History:  Past Surgical History:  Procedure Laterality Date   CARDIAC CATHETERIZATION N/A 01/07/2016   Procedure: Left Heart Cath and Coronary Angiography;  Surgeon: Corky Crafts, MD;  Location: [facility] INVASIVE CV LAB;  Service: Cardiovascular;  Laterality: N/A;   CORONARY ARTERY BYPASS GRAFT N/A 01/13/2016   Procedure: CORONARY ARTERY BYPASS GRAFTING (CABG) x4 Endoscopic Harvesting of the Right Greater Saphenous Vein;  Surgeon: Loreli Slot, MD;  Location: [facility] OR;  Service: Open Heart Surgery;  Laterality: N/A;   TEE WITHOUT CARDIOVERSION N/A 01/13/2016   Procedure: TRANSESOPHAGEAL ECHOCARDIOGRAM (TEE);  Surgeon: Loreli Slot, MD;  Location: [facility] OR;  Service: Open Heart Surgery;  Laterality: N/A;   TONSILLECTOMY     Social History:  reports that he quit smoking about 7 years ago. His smoking use included cigarettes. He has a 70.00 pack-year smoking history. He has never used smokeless tobacco. He reports current alcohol use. He reports that he does not use drugs. Family History:  Family History  Problem Relation Age of Onset   Arthritis Mother    Diabetes Mother    Arthritis Father    Hyperlipidemia Father    Diabetes Father    Diabetes Brother      HOME MEDICATIONS: Allergies as of 04/13/2021       Reactions   Asa [aspirin] Anaphylaxis, Swelling   Lips swelling        Medication List        Accurate as of April 13, 2021  1:47 PM. If you have any questions, ask your nurse or doctor.          STOP taking these medications    HumaLOG Mix 75/25 KwikPen (75-25) 100 UNIT/ML Kwikpen Generic drug: Insulin Lispro Prot & Lispro Stopped by: Scarlette Shorts, MD   rosuvastatin 20 MG tablet Commonly known as: CRESTOR Stopped by: Scarlette Shorts, MD       TAKE these medications    augmented betamethasone dipropionate 0.05 % cream Commonly known as: Diprolene AF Apply topically 2 (two) times daily.   clopidogrel 75 MG  tablet Commonly known as: Plavix Take 1 tablet (75 mg total) by mouth daily.   clotrimazole-betamethasone cream Commonly known as: LOTRISONE APPLY  CREAM TOPICALLY TWICE DAILY   dapagliflozin propanediol 5 MG Tabs tablet Commonly known as: Farxiga Take 1 tablet (5 mg total) by mouth daily before breakfast. Started by: Scarlette Shorts, MD   FreeStyle Libre 2 Sensor Misc 1 Device by Does not apply route every 14 (fourteen) days. Started by: Scarlette Shorts, MD   furosemide 20 MG tablet Commonly known as: LASIX Take 1 tablet (20 mg total) by mouth daily.   gabapentin 300 MG capsule Commonly known as: Neurontin Take 1 capsule (300 mg total) by mouth 3 (three) times daily.   insulin lispro 100 UNIT/ML KwikPen Commonly known as: HumaLOG KwikPen Inject 8 Units into the skin 3 (three) times daily. Started by: Scarlette Shorts, MD   Insulin Pen Needle 32G X 4 MM Misc 1 Device by Does not apply route in the morning, at noon, in the evening, and at bedtime. What changed: when to take this Changed by: Scarlette Shorts, MD   Lantus SoloStar 100 UNIT/ML Solostar Pen Generic drug: insulin glargine Inject 40 Units into the skin daily. Started by: Scarlette Shorts, MD   lidocaine 5 % Commonly known as: Lidoderm Place 1 patch onto the skin daily. Remove & Discard patch within 12 hours or as directed by MD   methocarbamol 500 MG tablet Commonly known as: ROBAXIN TAKE 1 TABLET BY MOUTH TWICE DAILY AS NEEDED FOR MUSCLE SPASM   metoprolol tartrate 100 MG tablet Commonly known as: LOPRESSOR Take 1 tablet (100 mg total) by mouth 2 (two) times daily.         OBJECTIVE:   Vital Signs: BP (!) 142/84 (BP Location: Left Arm, Patient Position: Sitting, Cuff Size: Small)   Pulse 68   Ht 5\' 10"  (1.778 m)   Wt 259 lb 3.2 oz (117.6 kg)   SpO2 98%   BMI 37.19 kg/m   Wt Readings from Last 3 Encounters:  04/13/21 259 lb 3.2 oz (117.6 kg)  02/17/21 261 lb (118.4  kg)  01/12/21 262 lb 2 oz (118.9 kg)     Exam: General: Pt appears well and is in NAD  Lungs: Clear with good BS bilat with no rales, rhonchi, or wheezes  Heart: RRR  Extremities: 1+ pretibial edema.  Neuro: MS is  good with appropriate affect, pt is alert and Ox3     DATA REVIEWED:  Lab Results  Component Value Date   HGBA1C 13.4 (H) 02/17/2021   HGBA1C 12.9 (H) 08/24/2020   HGBA1C 13.3 (H) 04/09/2020   Lab Results  Component Value Date   MICROALBUR 10.2 (H) 08/24/2020   LDLCALC 158 (H) 04/09/2020   CREATININE 1.19 02/17/2021   Lab Results  Component Value Date   MICRALBCREAT 5.4 08/24/2020     Lab Results  Component Value Date   CHOL 253 (H) 08/24/2020   HDL 49.30 08/24/2020   LDLCALC 158 (H) 04/09/2020   LDLDIRECT 180.0 08/24/2020   TRIG 234.0 (H) 08/24/2020   CHOLHDL 5 08/24/2020         ASSESSMENT / PLAN / RECOMMENDATIONS:   1) Type 2 Diabetes Mellitus, Poorly controlled, With Neuropathic and  Macrovascular complications - Most recent A1c of 13.4 %. Goal A1c < 7.0 %.    - Poorly controlled diabetes due to medication non-adherence and dietary indiscretions. We again discussed the importance of low carb diet and avoiding sugar-sweetened beverages.  - His in-office Bg 402  mg/dL. Had pepsi a few hours ago - He tells me he used to be on Farxiga, will restart as below  - He declined restarting Metformin in the past  - He is not taking the insulin mix correctly will switch to Basa;/prandial regimen  - I am also going to prescribe Freestyle libre  - Unfortunately my role is limited if the pt continues to drink sugar-sweetened beverage, not check glucose at home and not take his medications  and present the endo clinic once a year as he has been doing   MEDICATIONS:  Stop Humalog MIX  Start Lantus 40 units daily  Start Humalog 8 units with each meal  Start Farxiga 5 mg daily   EDUCATION / INSTRUCTIONS: BG monitoring instructions: Patient is instructed to  check his blood sugars 2 times a day, before breakfast and supper. Call [facility] if: BG persistently < 70 or > 300. I reviewed the Rule of 15 for the treatment of hypoglycemia in detail with the patient. Literature supplied.     2) Diabetic complications:  Eye: Does not have known diabetic retinopathy.  Neuro/ Feet: Does not have known diabetic peripheral neuropathy .  Renal: Patient does not have known baseline CKD. He   is  on an ACEI/ARB at present.    F/U in 4 months    Signed electronically by: Lyndle Herrlich, MD  [facility] Endocrinology  [facility] [address] [place] [place], [place] [address] Phone: [phone] FAX: [phone]   CC: Virgina Organ [address] [place] [address] Phone: [phone]  Fax: [phone]  Return to Endocrinology clinic as below: No future appointments.

## 2021-04-13 NOTE — Patient Instructions (Addendum)
STOP HUmalog MIX  Start Lantus 40 units daily  Start Humalog 8 units with each meal  Statr Farxiga 5 mg , 1 tablet daily    HOW TO TREAT LOW BLOOD SUGARS (Blood sugar LESS THAN 70 MG/DL) Please follow the RULE OF 15 for the treatment of hypoglycemia treatment (when your (blood sugars are less than 70 mg/dL)   STEP 1: Take 15 grams of carbohydrates when your blood sugar is low, which includes:  3-4 GLUCOSE TABS  OR 3-4 OZ OF JUICE OR REGULAR SODA OR ONE TUBE OF GLUCOSE GEL    STEP 2: RECHECK blood sugar in 15 MINUTES STEP 3: If your blood sugar is still low at the 15 minute recheck --> then, go back to STEP 1 and treat AGAIN with another 15 grams of carbohydrates.

## 2021-05-31 ENCOUNTER — Other Ambulatory Visit: Payer: Self-pay | Admitting: Family Medicine

## 2021-07-12 ENCOUNTER — Telehealth: Payer: Self-pay

## 2021-07-12 NOTE — Telephone Encounter (Signed)
Pt's Spouse called stating pt needs to be seen for meds he takes for his feet because the meds he is taking are making him sleepy on his job and requested to see Dr. Patsy Lager.  Dr. Patsy Lager last saw pt in August for foot issue.  She stated if he does not take his medication, he self medicates with alcohol and she does not want this to happen.  I saw pt's PCP is Dr.Lowne.  Spouse stated Lowne and pt do not get along and if TOC is not granted they will have to leave the practice.  I stated I would send the message and would get back to her.  Please advise.

## 2021-07-12 NOTE — Telephone Encounter (Signed)
JC declines.

## 2021-07-12 NOTE — Telephone Encounter (Signed)
I called and spoke with pt to advise Dr. Patsy Lager could not accept the transfer of care.  He wanted to know "what the problem was" because he stated Dr. Patsy Lager saw him before.  I stated that was probably because Dr. Ernst Spell schedule may have been full or she may have been out of the office and she saw you due to that.  I said her patient panel is full and she is not taking on new patients.  Patient said bye and disconnected the call.

## 2021-07-12 NOTE — Telephone Encounter (Signed)
Okay for TOC 

## 2021-07-12 NOTE — Telephone Encounter (Signed)
Decline transfer

## 2021-07-13 NOTE — Telephone Encounter (Signed)
fyi

## 2021-08-17 ENCOUNTER — Ambulatory Visit: Payer: PRIVATE HEALTH INSURANCE | Admitting: Internal Medicine

## 2021-08-17 NOTE — Progress Notes (Unsigned)
Name: Dennis Hopkins  Age/ Sex: 61 y.o., male   MRN/ DOB: [id number], Oct 02, 1960     PCP: Donato Schultz, DO   Reason for Endocrinology Evaluation: Type 2 Diabetes Mellitus  Initial Endocrine Consultative Visit: 11/08/2019    PATIENT IDENTIFIER: [name] is a 61 y.o. male with a past medical history of T2DM, HTN, CAD (S/P CABG in 2017) and Dyslipidemia.  The patient has followed with Endocrinology clinic since 11/08/2019 for consultative assistance with management of his diabetes.  DIABETIC HISTORY:  Mr. Pundt was diagnosed with DM in 2017, has been on metformin in the past pt attributes flank pain to metformin. His hemoglobin A1c has ranged from 6.8% in 2017, peaking at 12.1% in 2020.  On his initial visit to our clinic his A1c was 12.1% . He had a prescription for Januvia but was not taking.  Pt declined restarting metformin. We started Glipizide.    By 11/2019 we stopped Glipizide and started insulin mix    By 03/2021 stopped insulin mix, started MDI regimen and Farxiga with an A1c 13.4% SUBJECTIVE:   During the last visit (04/13/2021): A1c 13.4% . We stopped insulin mix, started basal/prandial regimen and farxiga      Today (08/17/2021): Mr. Sweda is here for a follow up on diabetes.  He checks his blood sugars 0 times daily.  He eats 3 meals a day, does not snack.  Drinks sugar-sweetened beverages  He has LE edema on Furosemide     HOME DIABETES REGIMEN:  Lantus 40 units daily  Start Humalog 8 units with each meal  Start Farxiga 5 mg daily      Statin: Yes ACE-I/ARB: Yes   METER DOWNLOAD SUMMARY:does not check    DIABETIC COMPLICATIONS: Microvascular complications:    Denies: CKD, neuropathy  Last eye exam: Completed long time ago    Macrovascular complications:  CAD (S/P CABG 2019) Denies: PVD, CVA  HISTORY:  Past Medical History:  Past Medical  History:  Diagnosis Date   Chickenpox    Hyperlipemia    Hypertension    Kidney stones 2013   Past Surgical History:  Past Surgical History:  Procedure Laterality Date   CARDIAC CATHETERIZATION N/A 01/07/2016   Procedure: Left Heart Cath and Coronary Angiography;  Surgeon: Corky Crafts, MD;  Location: [facility] INVASIVE CV LAB;  Service: Cardiovascular;  Laterality: N/A;   CORONARY ARTERY BYPASS GRAFT N/A 01/13/2016   Procedure: CORONARY ARTERY BYPASS GRAFTING (CABG) x4 Endoscopic Harvesting of the Right Greater Saphenous Vein;  Surgeon: Loreli Slot, MD;  Location: [facility] OR;  Service: Open Heart Surgery;  Laterality: N/A;   TEE WITHOUT CARDIOVERSION N/A 01/13/2016   Procedure: TRANSESOPHAGEAL ECHOCARDIOGRAM (TEE);  Surgeon: Loreli Slot, MD;  Location: [facility] OR;  Service: Open Heart Surgery;  Laterality: N/A;   TONSILLECTOMY     Social History:  reports that he quit smoking about 7 years ago. His smoking use included cigarettes. He has a 70.00 pack-year smoking history. He has never  used smokeless tobacco. He reports current alcohol use. He reports that he does not use drugs. Family History:  Family History  Problem Relation Age of Onset   Arthritis Mother    Diabetes Mother    Arthritis Father    Hyperlipidemia Father    Diabetes Father    Diabetes Brother      HOME MEDICATIONS: Allergies as of 08/17/2021       Reactions   Asa [aspirin] Anaphylaxis, Swelling   Lips swelling        Medication List        Accurate as of August 17, 2021  7:18 AM. If you have any questions, ask your nurse or doctor.          augmented betamethasone dipropionate 0.05 % cream Commonly known as: Diprolene AF Apply topically 2 (two) times daily.   clopidogrel 75 MG tablet Commonly known as: Plavix Take 1 tablet (75 mg total) by mouth daily.   clotrimazole-betamethasone cream Commonly known as: LOTRISONE APPLY  CREAM TOPICALLY TWICE DAILY   dapagliflozin propanediol 5  MG Tabs tablet Commonly known as: Farxiga Take 1 tablet (5 mg total) by mouth daily before breakfast.   FreeStyle Libre 2 Sensor Misc 1 Device by Does not apply route every 14 (fourteen) days.   furosemide 20 MG tablet Commonly known as: LASIX Take 1 tablet (20 mg total) by mouth daily.   gabapentin 300 MG capsule Commonly known as: NEURONTIN TAKE 1 CAPSULE BY MOUTH THREE TIMES DAILY   insulin lispro 100 UNIT/ML KwikPen Commonly known as: HumaLOG KwikPen Inject 8 Units into the skin 3 (three) times daily.   Insulin Pen Needle 32G X 4 MM Misc 1 Device by Does not apply route in the morning, at noon, in the evening, and at bedtime.   Lantus SoloStar 100 UNIT/ML Solostar Pen Generic drug: insulin glargine Inject 40 Units into the skin daily.   lidocaine 5 % Commonly known as: Lidoderm Place 1 patch onto the skin daily. Remove & Discard patch within 12 hours or as directed by MD   methocarbamol 500 MG tablet Commonly known as: ROBAXIN TAKE 1 TABLET BY MOUTH TWICE DAILY AS NEEDED FOR MUSCLE SPASM   metoprolol tartrate 100 MG tablet Commonly known as: LOPRESSOR Take 1 tablet (100 mg total) by mouth 2 (two) times daily.         OBJECTIVE:   Vital Signs: There were no vitals taken for this visit.  Wt Readings from Last 3 Encounters:  04/13/21 259 lb 3.2 oz (117.6 kg)  02/17/21 261 lb (118.4 kg)  01/12/21 262 lb 2 oz (118.9 kg)     Exam: General: Pt appears well and is in NAD  Lungs: Clear with good BS bilat with no rales, rhonchi, or wheezes  Heart: RRR  Extremities: 1+ pretibial edema.  Neuro: MS is good with appropriate affect, pt is alert and Ox3     DATA REVIEWED:  Lab Results  Component Value Date   HGBA1C 13.4 (H) 02/17/2021   HGBA1C 12.9 (H) 08/24/2020   HGBA1C 13.3 (H) 04/09/2020   Lab Results  Component Value Date   MICROALBUR 10.2 (H) 08/24/2020   LDLCALC 158 (H) 04/09/2020   CREATININE 1.19 02/17/2021   Lab Results  Component Value Date    MICRALBCREAT 5.4 08/24/2020     Lab Results  Component Value Date   CHOL 253 (H) 08/24/2020   HDL 49.30 08/24/2020   LDLCALC 158 (H) 04/09/2020   LDLDIRECT 180.0 08/24/2020  TRIG 234.0 (H) 08/24/2020   CHOLHDL 5 08/24/2020         ASSESSMENT / PLAN / RECOMMENDATIONS:   1) Type 2 Diabetes Mellitus, Poorly controlled, With Neuropathic and  Macrovascular complications - Most recent A1c of 13.4 %. Goal A1c < 7.0 %.    - Poorly controlled diabetes due to medication non-adherence and dietary indiscretions. We again discussed the importance of low carb diet and avoiding sugar-sweetened beverages.  - His in-office Bg 402  mg/dL. Had pepsi a few hours ago - He tells me he used to be on Farxiga, will restart as below  - He declined restarting Metformin in the past  - He is not taking the insulin mix correctly will switch to Basa;/prandial regimen  - I am also going to prescribe Freestyle libre  - Unfortunately my role is limited if the pt continues to drink sugar-sweetened beverage, not check glucose at home and not take his medications  and present the endo clinic once a year as he has been doing   MEDICATIONS:  Stop Humalog MIX  Start Lantus 40 units daily  Start Humalog 8 units with each meal  Start Farxiga 5 mg daily   EDUCATION / INSTRUCTIONS: BG monitoring instructions: Patient is instructed to check his blood sugars 2 times a day, before breakfast and supper. Call [facility] [place] [facility] if: BG persistently < 70 or > 300. I reviewed the Rule of 15 for the treatment of hypoglycemia in detail with the patient. Literature supplied.     2) Diabetic complications:  Eye: Does not have known diabetic retinopathy.  Neuro/ Feet: Does not have known diabetic peripheral neuropathy .  Renal: Patient does not have known baseline CKD. He   is  on an ACEI/ARB at present.    F/U in 4 months    Signed electronically by: Lyndle Herrlich, MD  [facility] Endocrinology   [facility] [address] [place] [place], [place] [address] Phone: [phone] FAX: [phone]   CC: Virgina Organ [address] [facility] [address] [place] [address] Phone: [phone]  Fax: [phone]  Return to Endocrinology clinic as below: Future Appointments  Date Time Provider Department Center  08/17/2021  2:20 PM Beverely Suen, Konrad Dolores, MD [id number]

## 2021-08-27 ENCOUNTER — Other Ambulatory Visit: Payer: Self-pay | Admitting: Family Medicine

## 2021-09-06 ENCOUNTER — Ambulatory Visit (INDEPENDENT_AMBULATORY_CARE_PROVIDER_SITE_OTHER): Payer: PRIVATE HEALTH INSURANCE | Admitting: Family Medicine

## 2021-09-06 ENCOUNTER — Encounter: Payer: Self-pay | Admitting: Family Medicine

## 2021-09-06 VITALS — BP 170/92 | HR 91 | Temp 98.6°F | Resp 18 | Ht 70.0 in | Wt 249.0 lb

## 2021-09-06 DIAGNOSIS — Z794 Long term (current) use of insulin: Secondary | ICD-10-CM

## 2021-09-06 DIAGNOSIS — M792 Neuralgia and neuritis, unspecified: Secondary | ICD-10-CM

## 2021-09-06 DIAGNOSIS — J984 Other disorders of lung: Secondary | ICD-10-CM

## 2021-09-06 DIAGNOSIS — M5441 Lumbago with sciatica, right side: Secondary | ICD-10-CM | POA: Diagnosis not present

## 2021-09-06 DIAGNOSIS — I25118 Atherosclerotic heart disease of native coronary artery with other forms of angina pectoris: Secondary | ICD-10-CM

## 2021-09-06 DIAGNOSIS — E1151 Type 2 diabetes mellitus with diabetic peripheral angiopathy without gangrene: Secondary | ICD-10-CM

## 2021-09-06 DIAGNOSIS — J8410 Pulmonary fibrosis, unspecified: Secondary | ICD-10-CM

## 2021-09-06 DIAGNOSIS — E1140 Type 2 diabetes mellitus with diabetic neuropathy, unspecified: Secondary | ICD-10-CM

## 2021-09-06 DIAGNOSIS — I10 Essential (primary) hypertension: Secondary | ICD-10-CM

## 2021-09-06 HISTORY — DX: Other disorders of lung: J98.4

## 2021-09-06 HISTORY — DX: Neuralgia and neuritis, unspecified: M79.2

## 2021-09-06 HISTORY — DX: Type 2 diabetes mellitus with diabetic peripheral angiopathy without gangrene: E11.51

## 2021-09-06 MED ORDER — METHOCARBAMOL 500 MG PO TABS: ORAL_TABLET | ORAL | 1 refills | Status: DC

## 2021-09-06 MED ORDER — GABAPENTIN 100 MG PO CAPS: ORAL_CAPSULE | ORAL | 3 refills | Status: DC

## 2021-09-06 NOTE — Patient Instructions (Signed)
Lumbosacral Radiculopathy °Lumbosacral radiculopathy is a condition that involves the spinal nerves and nerve roots in the low back and bottom of the spine. The condition develops when these nerves and nerve roots move out of place or become inflamed and cause symptoms. °What are the causes? °This condition may be caused by: °Pressure from a disk that bulges out of place (herniated disk). A disk is a plate of soft cartilage that separates bones in the spine. °Disk changes that occur with age (disk degeneration). °A narrowing of the bones of the lower back (spinal stenosis). °A tumor. °An infection. °An injury that places sudden pressure on the disks that cushion the bones of your lower spine. °What increases the risk? °You are more likely to develop this condition if: °You are a male who is 30-50 years old. °You are a male who is 50-60 years old. °You use improper technique when lifting things. °You are overweight or live a sedentary lifestyle. °You smoke. °Your work requires frequent lifting. °You do repetitive activities that strain the spine. °What are the signs or symptoms? °Symptoms of this condition include: °Pain that goes down from your back into your legs (sciatica), usually on one side of the body. This is the most common symptom. The pain may be worse when you sit, cough, or sneeze. °Tingling and numbness in your legs. °Muscle weakness in your legs. °Loss of bladder control or bowel control. °How is this diagnosed? °This condition may be diagnosed based on: °Your symptoms and medical history. °A physical exam. °If the pain lasts, you may have tests, such as: °MRI. °X-ray. °CT scan. °A type of CT scan used to examine the spinal canal after injecting a dye into your spine (myelogram). °A test to measure how electrical impulses move through a nerve (nerve conduction study). °A test to measure the electrical activity in muscles (electromyogram). °How is this treated? °In many cases, treatment is not needed  for this condition. With rest, the condition usually gets better over time. If treatment is needed, it may include: °Working with a physical therapist to improve strength and flexibility. °Taking pain medicine. °Applying heat or ice or both to the affected areas. °Having chiropractic spinal manipulation. °Using transcutaneous electrical nerve stimulation (TENS) therapy. °Getting a steroid injection in the spine. °Having surgery. This may be needed if other treatments do not help. Different types of surgery may be done depending on the cause of this condition. °Follow these instructions at home: °Activity °Avoid bending and any other activities that make the problem worse. °Maintain a proper position when standing or sitting. °When standing, keep your upper back and neck straight with your shoulders pulled back. Avoid slouching. °When sitting, keep your back straight and relax your shoulders. Do not round your shoulders or pull them backward. °Do not sit or stand in one place for long periods of time. °Take brief periods of rest throughout the day. This will reduce your pain. It is usually better to rest by lying down or standing, not sitting. °Mix in mild activity or stretching between long periods of rest. This will help to prevent stiffness and pain. °Get regular exercise. Ask your health care provider what activities are safe for you. If you were shown how to do any exercises or stretches, do them as told by your health care provider. °You may have to avoid lifting. Ask your health care provider how much you can safely lift. °Always use proper lifting technique, which includes: °Bending your knees. °Keeping the load close   to your body. °Avoiding twisting. °Managing pain °If directed, put ice on the affected area. To do this: °Put ice in a plastic bag. °Place a towel between your skin and the bag. °Leave the ice on for 20 minutes, 2-3 times a day. °Remove the ice if your skin turns bright red. This is very  important. If you cannot feel pain, heat, or cold, you have a greater risk of damage to the area. °If directed, apply heat to the affected area as often as told by your health care provider. Use the heat source that your health care provider recommends, such as a moist heat pack or a heating pad. °Place a towel between your skin and the heat source. °Leave the heat on for 20-30 minutes. °Remove the heat if your skin turns bright red. This is especially important if you are unable to feel pain, heat, or cold. You have a greater risk of getting burned. °Take over-the-counter and prescription medicines only as told by your health care provider. °General instructions °Sleep on a firm mattress in a comfortable position. Try lying on your side with your knees slightly bent. If you lie on your back, put a pillow under your knees. °Ask your health care provider if the medicine prescribed to you requires you to avoid driving or using machinery. °If your health care provider prescribed a diet or exercise program, follow it as told. °Keep all follow-up visits. This is important. °Contact a health care provider if: °Your pain does not get better over time, even when taking pain medicines. °Get help right away if: °You develop severe pain. °Your pain suddenly gets worse. °You develop increasing weakness in your legs. °You lose the ability to control your bladder or bowel. °You have difficulty walking or balancing. °You have a fever. °Summary °Lumbosacral radiculopathy is a condition that occurs when the spinal nerves and nerve roots in the lower part of the spine move out of place or become inflamed and cause symptoms. °Symptoms include pain, numbness, and tingling that go down from your back into your legs (sciatica), muscle weakness, and loss of bladder control or bowel control. °If directed, apply ice or heat or both to the affected area as told by your health care provider. °Follow instructions about activity, rest, and  proper lifting technique. °This information is not intended to replace advice given to you by your health care provider. Make sure you discuss any questions you have with your health care provider. °Document Revised: 12/10/2020 Document Reviewed: 12/10/2020 °Elsevier Patient Education © 2022 [employer] ° °

## 2021-09-06 NOTE — Assessment & Plan Note (Signed)
Lower gabepentin to 100 mg 1-3 tid  ?See if lower dose will help the pain ?

## 2021-09-06 NOTE — Assessment & Plan Note (Signed)
Pt has not taken his bp meds ?D/w pt importance of taking meds as directed  ?rto 2-3 weeks  ?

## 2021-09-06 NOTE — Progress Notes (Signed)
? ?Subjective:  ? ?By signing my name below, I, [name], attest that this documentation has been prepared under the direction and in the presence of Roma Schanz DO, 09/06/2021 ? ? Patient ID: Dennis Hopkins, male    DOB: 1960/08/22, 61 y.o.   MRN: [id number] ? ?Chief Complaint  ?Patient presents with  ? Medication Management  ?  Pt states having trouble with Gabapentin. Pt states medication makes him drowsy at work  ? ? ?HPI ?Patient is in today for an office visit.  ? ?Patient complains of lower leg pain that radiates into his back and arms. His pain started about 12 months ago. He describes pain as numbness and tingling. When he walks he notices muscle weakness in the lower legs.  ? ?Patient complains of sleepiness due to taking 300 MG of Gabapentin. He takes 1 capsule during the morning and his symptoms are relieved for most of the day. He states that taking the medication at night does not relieve his symptoms as effectively. ? ?His blood pressure is elevated as of today's visit. He takes his blood pressure medications occasionally in the morning. ?BP Readings from Last 3 Encounters:  ?09/06/21 (!) 170/92  ?04/13/21 (!) 142/84  ?02/17/21 (!) 160/80  ? ? ? ?Past Medical History:  ?Diagnosis Date  ? Chickenpox   ? Hyperlipemia   ? Hypertension   ? Kidney stones 2013  ? ? ?Past Surgical History:  ?Procedure Laterality Date  ? CARDIAC CATHETERIZATION N/A 01/07/2016  ? Procedure: Left Heart Cath and Coronary Angiography;  Surgeon: Jettie Booze, MD;  Location: [facility];  Service: Cardiovascular;  Laterality: N/A;  ? CORONARY ARTERY BYPASS GRAFT N/A 01/13/2016  ? Procedure: CORONARY ARTERY BYPASS GRAFTING (CABG) x4 Endoscopic Harvesting of the Right Greater Saphenous Vein;  Surgeon: Melrose Nakayama, MD;  Location: [place];  Service: Open Heart Surgery;  Laterality: N/A;  ? TEE WITHOUT CARDIOVERSION N/A 01/13/2016  ? Procedure: TRANSESOPHAGEAL ECHOCARDIOGRAM (TEE);  Surgeon: Melrose Nakayama, MD;  Location: [place];  Service: Open Heart Surgery;  Laterality: N/A;  ? TONSILLECTOMY    ? ? ?Family History  ?Problem Relation Age of Onset  ? Arthritis Mother   ? Diabetes Mother   ? Arthritis Father   ? Hyperlipidemia Father   ? Diabetes Father   ? Diabetes Brother   ? ? ?Social History  ? ?Socioeconomic History  ? Marital status: Married  ?  Spouse name: Not on file  ? Number of children: 1  ? Years of education: Not on file  ? Highest education level: Not on file  ?Occupational History  ? Not on file  ?Tobacco Use  ? Smoking status: Former  ?  Packs/day: 2.00  ?  Years: 35.00  ?  Pack years: 70.00  ?  Types: Cigarettes  ?  Quit date: 12/21/2013  ?  Years since quitting: 7.7  ? Smokeless tobacco: Never  ?Vaping Use  ? Vaping Use: Never used  ?Substance and Sexual Activity  ? Alcohol use: Yes  ?  Alcohol/week: 0.0 standard drinks  ?  Comment: 1-2 drinks per night- wife reports he drinks more   ? Drug use: No  ? Sexual activity: Yes  ?  Partners: Female  ?Other Topics Concern  ? Not on file  ?Social History Narrative  ? Not on file  ? ?Social Determinants of Health  ? ?Financial Resource Strain: Not on file  ?Food Insecurity: Not on file  ?Transportation Needs: Not  on file  ?Physical Activity: Not on file  ?Stress: Not on file  ?Social Connections: Not on file  ?Intimate Partner Violence: Not on file  ? ? ?Outpatient Medications Prior to Visit  ?Medication Sig Dispense Refill  ? augmented betamethasone dipropionate (DIPROLENE AF) 0.05 % cream Apply topically 2 (two) times daily. 30 g 0  ? clopidogrel (PLAVIX) 75 MG tablet Take 1 tablet (75 mg total) by mouth daily. 90 tablet 3  ? clotrimazole-betamethasone (LOTRISONE) cream APPLY  CREAM TOPICALLY TWICE DAILY 30 g 1  ? Continuous Blood Gluc Sensor (FREESTYLE LIBRE 2 SENSOR) MISC 1 Device by Does not apply route every 14 (fourteen) days. 2 each 6  ? dapagliflozin propanediol (FARXIGA) 5 MG TABS tablet Take 1 tablet (5 mg total) by mouth daily before  breakfast. 90 tablet 2  ? insulin glargine (LANTUS SOLOSTAR) 100 UNIT/ML Solostar Pen Inject 40 Units into the skin daily. 30 mL 6  ? insulin lispro (HUMALOG KWIKPEN) 100 UNIT/ML KwikPen Inject 8 Units into the skin 3 (three) times daily. 30 mL 2  ? Insulin Pen Needle 32G X 4 MM MISC 1 Device by Does not apply route in the morning, at noon, in the evening, and at bedtime. 400 each 2  ? lidocaine (LIDODERM) 5 % Place 1 patch onto the skin daily. Remove & Discard patch within 12 hours or as directed by MD 30 patch 0  ? metoprolol tartrate (LOPRESSOR) 100 MG tablet Take 1 tablet (100 mg total) by mouth 2 (two) times daily. 180 tablet 1  ? gabapentin (NEURONTIN) 300 MG capsule TAKE 1 CAPSULE BY MOUTH THREE TIMES DAILY 90 capsule 0  ? methocarbamol (ROBAXIN) 500 MG tablet TAKE 1 TABLET BY MOUTH TWICE DAILY AS NEEDED FOR MUSCLE SPASM 45 tablet 1  ? furosemide (LASIX) 20 MG tablet Take 1 tablet (20 mg total) by mouth daily. 90 tablet 3  ? ?No facility-administered medications prior to visit.  ? ? ?Allergies  ?Allergen Reactions  ? Asa [Aspirin] Anaphylaxis and Swelling  ?  Lips swelling  ? ? ?Review of Systems  ?Constitutional:  Negative for fever and malaise/fatigue.  ?HENT:  Negative for congestion.   ?Eyes:  Negative for blurred vision.  ?Respiratory:  Negative for shortness of breath.   ?Cardiovascular:  Negative for chest pain, palpitations and leg swelling.  ?Gastrointestinal:  Negative for abdominal pain, blood in stool and nausea.  ?Genitourinary:  Negative for dysuria and frequency.  ?Musculoskeletal:  Positive for back pain (Radiates to lower legs and arms). Negative for falls.  ?Skin:  Negative for rash.  ?Neurological:  Positive for tingling and weakness (Lower extremities). Negative for dizziness, loss of consciousness and headaches.  ?Endo/Heme/Allergies:  Negative for environmental allergies.  ?Psychiatric/Behavioral:  Negative for depression. The patient is not nervous/anxious.   ? ?   ?Objective:  ?   ?Physical Exam ?Vitals and nursing note reviewed.  ?Constitutional:   ?   General: He is not in acute distress. ?   Appearance: Normal appearance. He is well-developed. He is not ill-appearing.  ?HENT:  ?   Head: Normocephalic and atraumatic.  ?   Right Ear: External ear normal.  ?   Left Ear: External ear normal.  ?Eyes:  ?   Extraocular Movements: Extraocular movements intact.  ?   Pupils: Pupils are equal, round, and reactive to light.  ?Neck:  ?   Thyroid: No thyromegaly.  ?Cardiovascular:  ?   Rate and Rhythm: Normal rate and regular rhythm.  ?  Heart sounds: No murmur heard. ?  No gallop.  ?Pulmonary:  ?   Effort: Pulmonary effort is normal. No respiratory distress.  ?   Breath sounds: Normal breath sounds. No wheezing or rales.  ?Chest:  ?   Chest wall: No tenderness.  ?Musculoskeletal:     ?   General: Normal range of motion.  ?   Cervical back: Normal range of motion and neck supple.  ?   Right hip: Tenderness present. Normal range of motion. Normal strength.  ?   Left hip: Tenderness present. Normal range of motion. Normal strength.  ?   Right foot: Bony tenderness present. No swelling.  ?   Left foot: Bony tenderness present. No swelling.  ?   Comments: 5/5 strength in lower extremities  ?  ?Skin: ?   General: Skin is warm and dry.  ?Neurological:  ?   Mental Status: He is alert and oriented to person, place, and time.  ?   Sensory: Sensory deficit present.  ?   Comments: Pain and numbness and tingling in his legs and feet ?No weakness   ?Psychiatric:     ?   Behavior: Behavior normal.     ?   Thought Content: Thought content normal.     ?   Judgment: Judgment normal.  ? ? ?BP (!) 170/92 (BP Location: Right Arm, Patient Position: Sitting, Cuff Size: Large)   Pulse 91   Temp 98.6 ?F (37 ?C) (Oral)   Resp 18   Ht 5\' 10"  (1.778 m)   Wt 249 lb (112.9 kg)   SpO2 98%   BMI 35.73 kg/m?  ?Wt Readings from Last 3 Encounters:  ?09/06/21 249 lb (112.9 kg)  ?04/13/21 259 lb 3.2 oz (117.6 kg)  ?02/17/21 261  lb (118.4 kg)  ? ? ?Diabetic Foot Exam - Simple   ?No data filed ?  ? ?Lab Results  ?Component Value Date  ? WBC 7.1 02/17/2021  ? HGB 14.0 02/17/2021  ? HCT 42.3 02/17/2021  ? PLT 185.0 02/17/2021  ? GLUCOSE 395 (H) 08/31/

## 2021-11-02 ENCOUNTER — Ambulatory Visit (HOSPITAL_BASED_OUTPATIENT_CLINIC_OR_DEPARTMENT_OTHER)
Admission: RE | Admit: 2021-11-02 | Discharge: 2021-11-02 | Disposition: A | Discharge status: Home / Self Care | Payer: PRIVATE HEALTH INSURANCE | Source: Ambulatory Visit | Attending: Family Medicine | Admitting: Family Medicine

## 2021-11-02 ENCOUNTER — Ambulatory Visit (INDEPENDENT_AMBULATORY_CARE_PROVIDER_SITE_OTHER): Payer: PRIVATE HEALTH INSURANCE | Admitting: Family Medicine

## 2021-11-02 ENCOUNTER — Encounter: Payer: Self-pay | Admitting: Family Medicine

## 2021-11-02 VITALS — BP 160/100 | HR 98 | Temp 98.9°F | Resp 18 | Ht 70.0 in | Wt 240.0 lb

## 2021-11-02 DIAGNOSIS — G8929 Other chronic pain: Secondary | ICD-10-CM

## 2021-11-02 DIAGNOSIS — E785 Hyperlipidemia, unspecified: Secondary | ICD-10-CM

## 2021-11-02 DIAGNOSIS — E1165 Type 2 diabetes mellitus with hyperglycemia: Secondary | ICD-10-CM

## 2021-11-02 DIAGNOSIS — M5442 Lumbago with sciatica, left side: Secondary | ICD-10-CM

## 2021-11-02 DIAGNOSIS — Z8639 Personal history of other endocrine, nutritional and metabolic disease: Secondary | ICD-10-CM

## 2021-11-02 DIAGNOSIS — G59 Mononeuropathy in diseases classified elsewhere: Secondary | ICD-10-CM

## 2021-11-02 DIAGNOSIS — I10 Essential (primary) hypertension: Secondary | ICD-10-CM

## 2021-11-02 DIAGNOSIS — M5441 Lumbago with sciatica, right side: Secondary | ICD-10-CM | POA: Diagnosis present

## 2021-11-02 MED ORDER — METHOCARBAMOL 500 MG PO TABS: 500.0000 mg | ORAL_TABLET | Freq: Four times a day (QID) | ORAL | 1 refills | Status: DC

## 2021-11-02 NOTE — Assessment & Plan Note (Signed)
Poorly controlled will alter medications, encouraged DASH diet, minimize caffeine and obtain adequate sleep. Report concerning symptoms and follow up as directed and as needed ?Pt is not taking his meds--- he wanted to take something natural  ?D/w with him importance of taken meds  ?

## 2021-11-02 NOTE — Patient Instructions (Signed)

## 2021-11-02 NOTE — Assessment & Plan Note (Signed)
Pt needs to f/u with endo  ?

## 2021-11-02 NOTE — Progress Notes (Signed)
? ?Subjective:  ? ?By signing my name below, I, Shehryar Baig, attest that this documentation has been prepared under the direction and in the presence of Donato Schultz, DO  11/02/2021 ? ? ? Patient ID: Dennis Hopkins, male    DOB: [birth year], 61 y.o.   MRN: [id number] ? ?Chief Complaint  ?Patient presents with  ? Back Pain  ? Follow-up  ? ? ?Back Pain ?Pertinent negatives include no chest pain, fever or headaches.  ?Patient is in today for a follow up visit.  ? ?He continues complaining of low back pain. His notes his pain radiates to his sides. He stopped taking gabapentin due to making him drowsy. He is willing to try taking them at night to help him sleep. He has tried muscle relaxer to manage his pain and found relief. He also finds the muscle relaxer does not make him drowsy. His back numbness and pain bothers him at work. He finds his feet swell in the morning and make it painful to walk. He has not seen a specialist to manage his back pain. He has not had an x-ray on his back in a while.  ? ?His blood pressure is elevated during this visit. He stopped taking his 20 mg lasix daily PO, 100 mg metoprolol succinate daily PO. He is trying to lower his blood pressure through natural means.  ?BP Readings from Last 3 Encounters:  ?11/02/21 (!) 160/100  ?09/06/21 (!) 170/92  ?04/13/21 (!) 142/84  ? ?Pulse Readings from Last 3 Encounters:  ?11/02/21 98  ?09/06/21 91  ?04/13/21 68  ? ?He also complains of fatigue. He thinks his blood sugars may be contributing to his symptoms. He continues seeing an endocrinologist regularly for diabetes. He does not have a follow up appointment with them at this time. He has not checked his blood sugar recently.  ?Lab Results  ?Component Value Date  ? HGBA1C 13.4 (H) 02/17/2021  ? ? ?Past Medical History:  ?Diagnosis Date  ? Chickenpox   ? Hyperlipemia   ? Hypertension   ? Kidney stones 2013  ? ? ?Past Surgical History:  ?Procedure Laterality Date  ? CARDIAC CATHETERIZATION  N/A 01/07/2016  ? Procedure: Left Heart Cath and Coronary Angiography;  Surgeon: Corky Crafts, MD;  Location: [facility];  Service: Cardiovascular;  Laterality: N/A;  ? CORONARY ARTERY BYPASS GRAFT N/A 01/13/2016  ? Procedure: CORONARY ARTERY BYPASS GRAFTING (CABG) x4 Endoscopic Harvesting of the Right Greater Saphenous Vein;  Surgeon: Loreli Slot, MD;  Location: [facility];  Service: Open Heart Surgery;  Laterality: N/A;  ? TEE WITHOUT CARDIOVERSION N/A 01/13/2016  ? Procedure: TRANSESOPHAGEAL ECHOCARDIOGRAM (TEE);  Surgeon: Loreli Slot, MD;  Location: [facility];  Service: Open Heart Surgery;  Laterality: N/A;  ? TONSILLECTOMY    ? ? ?Family History  ?Problem Relation Age of Onset  ? Arthritis Mother   ? Diabetes Mother   ? Arthritis Father   ? Hyperlipidemia Father   ? Diabetes Father   ? Diabetes Brother   ? ? ?Social History  ? ?Socioeconomic History  ? Marital status: Married  ?  Spouse name: Not on file  ? Number of children: 1  ? Years of education: Not on file  ? Highest education level: Not on file  ?Occupational History  ? Not on file  ?Tobacco Use  ? Smoking status: Former  ?  Packs/day: 2.00  ?  Years: 35.00  ?  Pack years: 70.00  ?  Types: Cigarettes  ?  Quit date: 12/21/2013  ?  Years since quitting: 7.8  ? Smokeless tobacco: Never  ?Vaping Use  ? Vaping Use: Never used  ?Substance and Sexual Activity  ? Alcohol use: Yes  ?  Alcohol/week: 0.0 standard drinks  ?  Comment: 1-2 drinks per night- wife reports he drinks more   ? Drug use: No  ? Sexual activity: Yes  ?  Partners: Female  ?Other Topics Concern  ? Not on file  ?Social History Narrative  ? Not on file  ? ?Social Determinants of Health  ? ?Financial Resource Strain: Not on file  ?Food Insecurity: Not on file  ?Transportation Needs: Not on file  ?Physical Activity: Not on file  ?Stress: Not on file  ?Social Connections: Not on file  ?Intimate Partner Violence: Not on file  ? ? ?Outpatient Medications Prior to Visit  ?Medication  Sig Dispense Refill  ? augmented betamethasone dipropionate (DIPROLENE AF) 0.05 % cream Apply topically 2 (two) times daily. 30 g 0  ? clopidogrel (PLAVIX) 75 MG tablet Take 1 tablet (75 mg total) by mouth daily. 90 tablet 3  ? clotrimazole-betamethasone (LOTRISONE) cream APPLY  CREAM TOPICALLY TWICE DAILY 30 g 1  ? Continuous Blood Gluc Sensor (FREESTYLE LIBRE 2 SENSOR) MISC 1 Device by Does not apply route every 14 (fourteen) days. 2 each 6  ? dapagliflozin propanediol (FARXIGA) 5 MG TABS tablet Take 1 tablet (5 mg total) by mouth daily before breakfast. 90 tablet 2  ? gabapentin (NEURONTIN) 100 MG capsule 1-3 tid po 270 capsule 3  ? insulin glargine (LANTUS SOLOSTAR) 100 UNIT/ML Solostar Pen Inject 40 Units into the skin daily. 30 mL 6  ? insulin lispro (HUMALOG KWIKPEN) 100 UNIT/ML KwikPen Inject 8 Units into the skin 3 (three) times daily. 30 mL 2  ? Insulin Pen Needle 32G X 4 MM MISC 1 Device by Does not apply route in the morning, at noon, in the evening, and at bedtime. 400 each 2  ? lidocaine (LIDODERM) 5 % Place 1 patch onto the skin daily. Remove & Discard patch within 12 hours or as directed by MD 30 patch 0  ? methocarbamol (ROBAXIN) 500 MG tablet TAKE 1 TABLET BY MOUTH TWICE DAILY AS NEEDED FOR MUSCLE SPASM 45 tablet 1  ? metoprolol tartrate (LOPRESSOR) 100 MG tablet Take 1 tablet (100 mg total) by mouth 2 (two) times daily. 180 tablet 1  ? furosemide (LASIX) 20 MG tablet Take 1 tablet (20 mg total) by mouth daily. 90 tablet 3  ? ?No facility-administered medications prior to visit.  ? ? ?Allergies  ?Allergen Reactions  ? Asa [Aspirin] Anaphylaxis and Swelling  ?  Lips swelling  ? ? ?Review of Systems  ?Constitutional:  Positive for malaise/fatigue. Negative for fever.  ?HENT:  Negative for congestion.   ?Eyes:  Negative for blurred vision.  ?Respiratory:  Negative for cough and shortness of breath.   ?Cardiovascular:  Negative for chest pain, palpitations and leg swelling.  ?Gastrointestinal:   Negative for vomiting.  ?Musculoskeletal:  Positive for back pain (lower back pain).  ?Skin:  Negative for rash.  ?Neurological:  Negative for loss of consciousness and headaches.  ? ?   ?Objective:  ?  ?Physical Exam ?Vitals and nursing note reviewed.  ?Constitutional:   ?   General: He is not in acute distress. ?   Appearance: Normal appearance. He is not ill-appearing.  ?HENT:  ?   Head: Normocephalic and atraumatic.  ?  Right Ear: External ear normal.  ?   Left Ear: External ear normal.  ?Eyes:  ?   Extraocular Movements: Extraocular movements intact.  ?   Pupils: Pupils are equal, round, and reactive to light.  ?Cardiovascular:  ?   Rate and Rhythm: Normal rate and regular rhythm.  ?   Heart sounds: Normal heart sounds. No murmur heard. ?  No gallop.  ?Pulmonary:  ?   Effort: Pulmonary effort is normal. No respiratory distress.  ?   Breath sounds: Normal breath sounds. No wheezing or rales.  ?Skin: ?   General: Skin is warm and dry.  ?Neurological:  ?   Mental Status: He is alert and oriented to person, place, and time.  ?Psychiatric:     ?   Judgment: Judgment normal.  ? ? ?BP (!) 160/100 (BP Location: Left Arm, Patient Position: Sitting, Cuff Size: Normal)   Pulse 98   Temp 98.9 ?F (37.2 ?C) (Oral)   Resp 18   Ht 5\' 10"  (1.778 m)   Wt 240 lb (108.9 kg)   SpO2 98%   BMI 34.44 kg/m?  ?Wt Readings from Last 3 Encounters:  ?11/02/21 240 lb (108.9 kg)  ?09/06/21 249 lb (112.9 kg)  ?04/13/21 259 lb 3.2 oz (117.6 kg)  ? ? ?Diabetic Foot Exam - Simple   ?No data filed ?  ? ?Lab Results  ?Component Value Date  ? WBC 7.1 02/17/2021  ? HGB 14.0 02/17/2021  ? HCT 42.3 02/17/2021  ? PLT 185.0 02/17/2021  ? GLUCOSE 395 (H) 02/17/2021  ? CHOL 253 (H) 08/24/2020  ? TRIG 234.0 (H) 08/24/2020  ? HDL 49.30 08/24/2020  ? LDLDIRECT 180.0 08/24/2020  ? LDLCALC 158 (H) 04/09/2020  ? ALT 28 02/17/2021  ? AST 23 02/17/2021  ? NA 134 (L) 02/17/2021  ? K 4.2 02/17/2021  ? CL 95 (L) 02/17/2021  ? CREATININE 1.19 02/17/2021  ?  BUN 17 02/17/2021  ? CO2 28 02/17/2021  ? TSH 2.04 02/17/2021  ? PSA 0.21 08/24/2020  ? INR 1.32 01/13/2016  ? HGBA1C 13.4 (H) 02/17/2021  ? MICROALBUR 10.2 (H) 08/24/2020  ? ? ?Lab Results  ?Component Value Date  ? T

## 2021-11-02 NOTE — Assessment & Plan Note (Signed)
Encourage heart healthy diet such as MIND or DASH diet, increase exercise, avoid trans fats, simple carbohydrates and processed foods, consider a krill or fish or flaxseed oil cap daily.  °

## 2021-11-03 ENCOUNTER — Telehealth: Payer: Self-pay | Admitting: *Deleted

## 2021-11-03 LAB — LIPID PANEL
Cholesterol: 227 mg/dL — ABNORMAL HIGH (ref 0–200)
HDL: 42.1 mg/dL
LDL Cholesterol: 150 mg/dL — ABNORMAL HIGH (ref 0–99)
NonHDL: 185.08
Total CHOL/HDL Ratio: 5
Triglycerides: 177 mg/dL — ABNORMAL HIGH (ref 0.0–149.0)
VLDL: 35.4 mg/dL (ref 0.0–40.0)

## 2021-11-03 LAB — COMPREHENSIVE METABOLIC PANEL WITH GFR
ALT: 17 U/L (ref 0–53)
AST: 17 U/L (ref 0–37)
Albumin: 4.3 g/dL (ref 3.5–5.2)
Alkaline Phosphatase: 73 U/L (ref 39–117)
BUN: 16 mg/dL (ref 6–23)
CO2: 31 meq/L (ref 19–32)
Calcium: 9.6 mg/dL (ref 8.4–10.5)
Chloride: 96 meq/L (ref 96–112)
Creatinine, Ser: 1.12 mg/dL (ref 0.40–1.50)
GFR: 71.34 mL/min
Glucose, Bld: 233 mg/dL — ABNORMAL HIGH (ref 70–99)
Potassium: 3.9 meq/L (ref 3.5–5.1)
Sodium: 136 meq/L (ref 135–145)
Total Bilirubin: 0.6 mg/dL (ref 0.2–1.2)
Total Protein: 7.9 g/dL (ref 6.0–8.3)

## 2021-11-03 LAB — HEMOGLOBIN A1C: Hgb A1c MFr Bld: 11 % — ABNORMAL HIGH (ref 4.6–6.5)

## 2021-11-03 NOTE — Chronic Care Management (AMB) (Signed)
?  Care Management  ? ?Note ? ?11/03/2021 ?Name: Dennis Hopkins MRN: [id number] DOB: Dec 27, 1960 ? ?Dennis Hopkins is a 61 y.o. year old male who is a primary care patient of Donato Schultz, DO. I reached out to [name] by phone today offer care coordination services.  ? ?Dennis Hopkins was given information about care management services today including:  ?Care management services include personalized support from designated clinical staff supervised by his physician, including individualized plan of care and coordination with other care providers ?24/7 contact phone numbers for assistance for urgent and routine care needs. ?The patient may stop care management services at any time by phone call to the office staff. ? ?Patient agreed to services and verbal consent obtained.  ? ?Follow up plan: ?Telephone appointment with care management team member scheduled for: 11/11/2021 ? ?Shrinika Blatz, CCMA ?Care Guide, Embedded Care Coordination ?[place]  Care Management  ?Direct Dial: [phone] ? ? ?

## 2021-11-08 ENCOUNTER — Telehealth: Payer: Self-pay | Admitting: Pharmacist

## 2021-11-08 ENCOUNTER — Other Ambulatory Visit: Payer: Self-pay | Admitting: Family Medicine

## 2021-11-08 DIAGNOSIS — E1165 Type 2 diabetes mellitus with hyperglycemia: Secondary | ICD-10-CM

## 2021-11-08 DIAGNOSIS — E1169 Type 2 diabetes mellitus with other specified complication: Secondary | ICD-10-CM

## 2021-11-08 DIAGNOSIS — I10 Essential (primary) hypertension: Secondary | ICD-10-CM

## 2021-11-08 NOTE — Chronic Care Management (AMB) (Signed)
    Chronic Care Management Pharmacy Assistant   Name: Dennis Hopkins  MRN: [id number] DOB: 11-26-1960  Dennis Hopkins is an 61 y.o. year old male who presents for his initial CCM visit with the clinical pharmacist.   Recent office visits:  11/02/21-Dennis R. Carollee Herter, DO (PCP) Seen for follow up and back pain visit. DG Lumbar Spine Complete ordered. Labs ordered. AMB Referral to [facility] Coordination and Ambulatory referral to Podiatry. Follow up in 3 months. 09/06/21-Dennis RCarollee Herter, DO (PCP) Seen for mediation management. Follow up in 3 weeks.  Recent consult visits:  None noted  Hospital visits:  None in previous 6 months  Medications: Outpatient Encounter Medications as of 11/08/2021  Medication Sig   augmented betamethasone dipropionate (DIPROLENE AF) 0.05 % cream Apply topically 2 (two) times daily.   clopidogrel (PLAVIX) 75 MG tablet Take 1 tablet (75 mg total) by mouth daily.   clotrimazole-betamethasone (LOTRISONE) cream APPLY  CREAM TOPICALLY TWICE DAILY   Continuous Blood Gluc Sensor (FREESTYLE LIBRE 2 SENSOR) MISC 1 Device by Does not apply route every 14 (fourteen) days.   dapagliflozin propanediol (FARXIGA) 5 MG TABS tablet Take 1 tablet (5 mg total) by mouth daily before breakfast.   furosemide (LASIX) 20 MG tablet Take 1 tablet (20 mg total) by mouth daily.   gabapentin (NEURONTIN) 100 MG capsule 1-3 tid po   insulin glargine (LANTUS SOLOSTAR) 100 UNIT/ML Solostar Pen Inject 40 Units into the skin daily.   insulin lispro (HUMALOG KWIKPEN) 100 UNIT/ML KwikPen Inject 8 Units into the skin 3 (three) times daily.   Insulin Pen Needle 32G X 4 MM MISC 1 Device by Does not apply route in the morning, at noon, in the evening, and at bedtime.   lidocaine (LIDODERM) 5 % Place 1 patch onto the skin daily. Remove & Discard patch within 12 hours or as directed by MD   methocarbamol (ROBAXIN) 500 MG tablet TAKE 1 TABLET BY MOUTH TWICE DAILY AS NEEDED FOR MUSCLE  SPASM   methocarbamol (ROBAXIN) 500 MG tablet Take 1 tablet (500 mg total) by mouth 4 (four) times daily.   metoprolol tartrate (LOPRESSOR) 100 MG tablet Take 1 tablet (100 mg total) by mouth 2 (two) times daily.   No facility-administered encounter medications on file as of 11/08/2021.   Augmented betamethasone dipropionate (DIPROLENE AF) 0.05 % cream Last filled:None noted Clopidogrel (PLAVIX) 75 MG tablet Last filled:09/14/20 90 DS Clotrimazole-betamethasone (LOTRISONE) cream Last filled:04/10/21 15 DS Dapagliflozin propanediol (FARXIGA) 5 MG TABS tablet Last filled:07/18/21 90 DS Furosemide (LASIX) 20 MG tablet (Expired) Last filled:08/09/21 90 DS Gabapentin (NEURONTIN) 100 MG capsule Last filled:09/07/21 30 DS Insulin glargine (LANTUS SOLOSTAR) 100 UNIT/ML Solostar Pen Last filled:11/01/21 38 DS Insulin lispro (HUMALOG KWIKPEN) 100 UNIT/ML KwikPen Last filled:11/01/21 62 DS Lidocaine (LIDODERM) 5 % Last filled:02/09/19 30 DS Methocarbamol (ROBAXIN) 500 MG tablet Last filled:11/02/21 30 DS Metoprolol tartrate (LOPRESSOR) 100 MG tablet Last filled:02/07/21 90 DS   Care Gaps: OPHTHALMOLOGY EXAM:Never done Hepatitis C Screening:Never done Zoster Vaccines:Never done  Star Rating Drugs: Dapagliflozin propanediol (FARXIGA) 5 MG TABS tablet Last filled:07/18/21 90 DS Insulin glargine (LANTUS SOLOSTAR) 100 UNIT/ML Solostar Pen Last filled:11/01/21 38 DS Insulin lispro (HUMALOG KWIKPEN) 100 UNIT/ML KwikPen Last filled:11/01/21 62 DS  Dennis Hopkins, Dennis Hopkins

## 2021-11-09 ENCOUNTER — Other Ambulatory Visit: Payer: Self-pay | Admitting: *Deleted

## 2021-11-09 DIAGNOSIS — G8929 Other chronic pain: Secondary | ICD-10-CM

## 2021-11-09 MED ORDER — DAPAGLIFLOZIN PROPANEDIOL 10 MG PO TABS: 10.0000 mg | ORAL_TABLET | Freq: Every day | ORAL | 2 refills | Status: AC

## 2021-11-09 MED ORDER — ROSUVASTATIN CALCIUM 20 MG PO TABS
20.0000 mg | ORAL_TABLET | Freq: Every day | ORAL | 2 refills | Status: DC
Start: 2021-11-09 — End: 2022-02-23

## 2021-11-11 ENCOUNTER — Ambulatory Visit: Payer: PRIVATE HEALTH INSURANCE | Admitting: Pharmacist

## 2021-11-11 DIAGNOSIS — I10 Essential (primary) hypertension: Secondary | ICD-10-CM

## 2021-11-11 DIAGNOSIS — E1159 Type 2 diabetes mellitus with other circulatory complications: Secondary | ICD-10-CM

## 2021-11-11 DIAGNOSIS — I2510 Atherosclerotic heart disease of native coronary artery without angina pectoris: Secondary | ICD-10-CM

## 2021-11-11 DIAGNOSIS — E785 Hyperlipidemia, unspecified: Secondary | ICD-10-CM

## 2021-11-11 MED ORDER — INSULIN LISPRO (1 UNIT DIAL) 100 UNIT/ML (KWIKPEN)
8.0000 [IU] | PEN_INJECTOR | Freq: Three times a day (TID) | SUBCUTANEOUS | 0 refills | Status: DC
Start: 2021-11-11 — End: 2022-04-18

## 2021-11-11 MED ORDER — CLOPIDOGREL BISULFATE 75 MG PO TABS: 75.0000 mg | ORAL_TABLET | Freq: Every day | ORAL | 1 refills | Status: DC

## 2021-11-11 MED ORDER — METOPROLOL TARTRATE 100 MG PO TABS: 100.0000 mg | ORAL_TABLET | Freq: Two times a day (BID) | ORAL | 1 refills | Status: DC

## 2021-11-11 NOTE — Chronic Care Management (AMB) (Signed)
Addendum:  Received call from [facility], they were able to process Humalog Rx for generic and cost for 1 box was $35!. Patient called - Lm on VM.

## 2021-11-11 NOTE — Patient Instructions (Signed)
Dennis Hopkins It was a pleasure speaking with you today  Recommendations: I am working on getting your Continuous Glucose Monitor Freestyle Dennis Hopkins approved / provided through [employer]. I will up date you when I have more information. You might received a phone call from [facility] - this is the company that will be providing the Continuous Glucose Monitor and they might contact you with questions or to discuss cost.  Home blood glucose  goals  Fasting blood glucose goal (before meals) = 80 to 130 Blood glucose goal after a meal = less than 180  A1c goal < 8.0% Lab Results  Component Value Date   HGBA1C 11.0 (H) 11/02/2021    Restart rosuvastatin 20mg  take 1 tablet daily (can take with food) - this helps to prevent blockages in your blood vessels. Since you have already had bypass surgery this is very important to prevent it from happening again.   LDL goal is < 70 Lab Results  Component Value Date   LDLCALC 150 (H) 11/02/2021   Restart clopidogrel 75mg  daily - this medication also helps to prevent blockages / stroke.  I have sent an updated prescription and coupon for Humalog to [facility] - I am hoping this will help with the cost of Humalog and it will be around $35 per month. Continue to take Lantus 40 units daily and Farxiga 10mg  daily Restart metoprolol 100mg  twice a day - take with a meal/ food. This helps to lower your blood pressure. Uncontrolled blood pressure can lead to heart and kidney disease. Your blood pressure goal is < 140/90 BP Readings from Last 3 Encounters:  11/02/21 (!) 160/100  09/06/21 (!) 170/92  04/13/21 (!) 142/84   Please contact me if you have any questions or if you think you are having a side effect. We can discuss alternative medications if needed.      , PharmD Clinical Pharmacist [facility] [phone] (direct line)  [phone] (main office number)   The patient verbalized  understanding of instructions, educational materials, and care plan provided today and agreed to receive a mailed copy of patient instructions, educational materials, and care plan.

## 2021-11-11 NOTE — Chronic Care Management (AMB) (Signed)
Care Management   Pharmacy Note  11/11/2021 Name: Dennis Hopkins MRN: [id number] DOB: 12/10/1960  Summary:  Patient admits to low adherence to medications and blood glucose testing. Barriers include - cost of medications - Humalog and testing supplies - Freestyle Libre Continuous Glucose Monitor. He also is not taking clopidogrel. Per patient was stopped for surgery and he was not sure if he was suppose to restart.  He is not taking rosuvastatin or metoprolol - states that he feels that they caused indigestion but has been a long time since he has tried to take either.  Blood pressure, cholesterol and blood glucose are poorly controlled.   Recommendations / Interventions: Called [employer] and [employer] to check into coverage for Continuous Glucose Monitor. Was not able to get a lot of information. [employer] said Continuous Glucose Monitor is not covered on pharmacy benefits. [facility] referred me to [facility] which handles their DME but they were unable to provide information on potential cost. [facility] states prior authorization is not needed and patient is not restricted on which DME he can use. Sent request for [employer] [address] on [address] to  [facility] - order is pending.  Long discussion with patient regarding risk of heart attack, stroke and kidney disease with current state of health - uncontrolled DM, hypertension and hyperlipidemia.  Working on getting Humalog at lower cost - patient states is $105 for 32mL. Provided updated Rx to include possible titration and provided [employer] discount care which might lower cost to $35. Patient reports his Lantus is $20 and is affordable. Continue to take [place] - patient states he has 3 month supply currently. Will forward coupon card with potential $0 copay.  Updated prescription for metoprolol sent to pharmacy. Instruction patient to retry 100mg  2 times a day and to take with a meal. I offered to change to metoprolol ER and take once daily but patient  declined.  He also will restart rosuvastatin and take with food. Patient is encouraged to contact me if he has any suspected side effects.  Sent in Rx for clopidogrel 75mg  daily - patient to restart. He has CAD with CABG x 4 and unable to take aspirin.   Follow up in 2 weeks  Subjective: Dennis Hopkins is a 61 y.o. year old male who is a primary care patient of [name], DO. The Care Management team was consulted for assistance with care management and care coordination needs.    Engaged with patient by telephone for initial visit in response to provider referral for pharmacy case management and/or care coordination services.   The patient was given information about Care Management services today including:  Care Management services includes personalized support from designated clinical staff supervised by the patient's primary care provider, including individualized plan of care and coordination with other care providers. 24/7 contact phone numbers for assistance for urgent and routine care needs. The patient may stop case management services at any time by phone call to the office staff.  Patient agreed to services and consent obtained.  Assessment:  Review of patient status, including review of consultants reports, laboratory and other test data, was performed as part of comprehensive evaluation and provision of chronic care management services.   SDOH (Social Determinants of Health) assessments and interventions performed:    Objective:  Lab Results  Component Value Date   CREATININE 1.12 11/02/2021   CREATININE 1.19 02/17/2021   CREATININE 1.3 09/23/2020    Lab Results  Component Value  Date   HGBA1C 11.0 (H) 11/02/2021       Component Value Date/Time   CHOL 227 (H) 11/02/2021 1645   TRIG 177.0 (H) 11/02/2021 1645   HDL 42.10 11/02/2021 1645   CHOLHDL 5 11/02/2021 1645   VLDL 35.4 11/02/2021 1645   LDLCALC 150 (H) 11/02/2021 1645   LDLCALC 158 (H)  04/09/2020 1627   LDLDIRECT 180.0 08/24/2020 1619    Other: (TSH, CBC, Vit D, etc.)  Clinical ASCVD: Yes  The 10-year ASCVD risk score (Arnett DK, et al., 2019) is: 32.4%   Values used to calculate the score:     Age: 61 years     Sex: Male     Is Non-Hispanic African American: No     Diabetic: Yes     Tobacco smoker: No     Systolic Blood Pressure: 0000000 mmHg     Is BP treated: Yes     HDL Cholesterol: 42.1 mg/dL     Total Cholesterol: 227 mg/dL    Other: (CHADS2VASc if Afib, PHQ9 if depression, MMRC or CAT for COPD, ACT, DEXA)  BP Readings from Last 3 Encounters:  11/02/21 (!) 160/100  09/06/21 (!) 170/92  04/13/21 (!) 142/84    Care Plan  Allergies  Allergen Reactions   Asa [Aspirin] Anaphylaxis and Swelling    Lips swelling    Medications Reviewed Today     Reviewed by Cherre Robins, RPH-CPP (Pharmacist) on 11/11/21 at 1124  Med List Status: <None>   Medication Order Taking? Sig Documenting Provider Last Dose Status Informant  augmented betamethasone dipropionate (DIPROLENE AF) 0.05 % cream [id number] No Apply topically 2 (two) times daily.  Patient not taking: Reported on 11/11/2021   [name], DO Not Taking Active   clopidogrel (PLAVIX) 75 MG tablet [id number] No Take 1 tablet (75 mg total) by mouth daily.  Patient not taking: Reported on 11/11/2021   Richardo Priest, MD Not Taking Active   clotrimazole-betamethasone Donalynn Furlong) cream [id number] No APPLY  CREAM TOPICALLY TWICE DAILY  Patient not taking: Reported on 11/11/2021   [name], DO Not Taking Active   Continuous Blood Gluc Sensor (FREESTYLE LIBRE 2 SENSOR) [place] [id number] No 1 Device by Does not apply route every 14 (fourteen) days.  Patient not taking: Reported on 11/11/2021   Shamleffer, Melanie Crazier, MD Not Taking Active   dapagliflozin propanediol (FARXIGA) 10 MG TABS tablet [id number] Yes Take 1 tablet (10 mg total) by mouth daily before breakfast. Carollee Herter, Alferd Apa, DO  Taking Active   furosemide (LASIX) 20 MG tablet [id number] Yes Take 1 tablet (20 mg total) by mouth daily. Richardo Priest, MD Taking Active   gabapentin (NEURONTIN) 100 MG capsule [id number] Yes 1-3 tid po  Patient taking differently: Take 100 mg by mouth 3 (three) times daily.   Roma Schanz R, DO Taking Active   insulin glargine (LANTUS SOLOSTAR) 100 UNIT/ML Solostar Pen [id number] Yes Inject 40 Units into the skin daily. Shamleffer, Melanie Crazier, MD Taking Active   insulin lispro (HUMALOG KWIKPEN) 100 UNIT/ML KwikPen [id number] Yes Inject 8 Units into the skin 3 (three) times daily. Shamleffer, Melanie Crazier, MD Taking Active   Insulin Pen Needle 32G X 4 MM MISC [id number] Yes 1 Device by Does not apply route in the morning, at noon, in the evening, and at bedtime. Shamleffer, Melanie Crazier, MD Taking Active   lidocaine (LIDODERM) 5 % [id number] Yes Place 1 patch onto the skin daily. Remove & Discard patch within  12 hours or as directed by MD [provider]  Active   methocarbamol (ROBAXIN) 500 MG tablet [id number] Yes Take 1 tablet (500 mg total) by mouth 4 (four) times daily. Roma Schanz R, DO Taking Active   metoprolol tartrate (LOPRESSOR) 100 MG tablet [id number] No Take 1 tablet (100 mg total) by mouth 2 (two) times daily.  Patient not taking: Reported on 11/11/2021   [name], DO Not Taking Active   rosuvastatin (CRESTOR) 20 MG tablet [id number] Yes Take 1 tablet (20 mg total) by mouth daily. 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[place]) 09/06/2021   Peripheral neuropathic pain 09/06/2021   Type 2 diabetes mellitus with hyperglycemia, without long-term current use of insulin ([place]) 04/13/2021   Diabetes mellitus ([place])  04/13/2021   Hypertension    Hyperlipemia    Chickenpox    Urinary frequency 08/24/2020   Snoring 08/24/2020   Rash 08/24/2020   Kidney stones 02/14/2019   Acute left-sided low back pain with right-sided sciatica 02/14/2019   DM (diabetes mellitus) type II uncontrolled, periph vascular disorder 07/19/2016   Diabetes ([place]) 01/18/2016   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   Medication Assistance:   Provided discount coupons for higher cost medications - Farxiga and Humalog  Follow Up:  Patient agrees to Care Plan and Follow-up.  Plan: Telephone follow up appointment with care management team member scheduled for:  2 weeks  Cherre Robins, PharmD Clinical Pharmacist [facility] [place] [facility]

## 2021-11-13 ENCOUNTER — Ambulatory Visit (HOSPITAL_BASED_OUTPATIENT_CLINIC_OR_DEPARTMENT_OTHER): Payer: PRIVATE HEALTH INSURANCE

## 2021-11-16 ENCOUNTER — Ambulatory Visit (INDEPENDENT_AMBULATORY_CARE_PROVIDER_SITE_OTHER): Payer: PRIVATE HEALTH INSURANCE

## 2021-11-16 ENCOUNTER — Ambulatory Visit (INDEPENDENT_AMBULATORY_CARE_PROVIDER_SITE_OTHER): Payer: PRIVATE HEALTH INSURANCE | Admitting: Podiatry

## 2021-11-16 DIAGNOSIS — M79671 Pain in right foot: Secondary | ICD-10-CM

## 2021-11-16 DIAGNOSIS — M79674 Pain in right toe(s): Secondary | ICD-10-CM | POA: Diagnosis not present

## 2021-11-16 DIAGNOSIS — M722 Plantar fascial fibromatosis: Secondary | ICD-10-CM

## 2021-11-16 DIAGNOSIS — E1149 Type 2 diabetes mellitus with other diabetic neurological complication: Secondary | ICD-10-CM | POA: Diagnosis not present

## 2021-11-16 DIAGNOSIS — B351 Tinea unguium: Secondary | ICD-10-CM

## 2021-11-16 DIAGNOSIS — B353 Tinea pedis: Secondary | ICD-10-CM | POA: Diagnosis not present

## 2021-11-16 MED ORDER — KETOCONAZOLE 2 % EX CREA: 1.0000 "application " | TOPICAL_CREAM | Freq: Every day | CUTANEOUS | 2 refills | Status: DC

## 2021-11-16 MED ORDER — CICLOPIROX 8 % EX SOLN
Freq: Every day | CUTANEOUS | 0 refills | Status: DC
Start: 2021-11-16 — End: 2023-04-15

## 2021-11-16 NOTE — Patient Instructions (Signed)

## 2021-11-20 ENCOUNTER — Ambulatory Visit (HOSPITAL_BASED_OUTPATIENT_CLINIC_OR_DEPARTMENT_OTHER)
Admission: RE | Admit: 2021-11-20 | Discharge: 2021-11-20 | Disposition: A | Discharge status: Home / Self Care | Payer: PRIVATE HEALTH INSURANCE | Source: Ambulatory Visit | Attending: Family Medicine | Admitting: Family Medicine

## 2021-11-20 DIAGNOSIS — M5442 Lumbago with sciatica, left side: Secondary | ICD-10-CM | POA: Insufficient documentation

## 2021-11-20 DIAGNOSIS — M5441 Lumbago with sciatica, right side: Secondary | ICD-10-CM | POA: Insufficient documentation

## 2021-11-20 DIAGNOSIS — G8929 Other chronic pain: Secondary | ICD-10-CM | POA: Diagnosis present

## 2021-11-22 ENCOUNTER — Ambulatory Visit: Payer: PRIVATE HEALTH INSURANCE | Admitting: Pharmacist

## 2021-11-22 DIAGNOSIS — I2510 Atherosclerotic heart disease of native coronary artery without angina pectoris: Secondary | ICD-10-CM

## 2021-11-22 DIAGNOSIS — I10 Essential (primary) hypertension: Secondary | ICD-10-CM

## 2021-11-22 DIAGNOSIS — E785 Hyperlipidemia, unspecified: Secondary | ICD-10-CM

## 2021-11-22 DIAGNOSIS — Z794 Long term (current) use of insulin: Secondary | ICD-10-CM

## 2021-11-22 NOTE — Patient Instructions (Signed)
Mr. Cavey It was a pleasure speaking with you today  Recommendations: I am working on getting your Continuous Glucose Monitor Freestyle Elenor Legato approved / provided through [employer]. Called [facility] - they will discuss cost and ship Freestyle [place] sensor and reader to you. [phone] Contact me to scuedule appointment for education once you receive Continuous Glucose Monitor sensors. Home blood glucose  goals  Fasting blood glucose goal (before meals) = 80 to 130 Blood glucose goal after a meal = less than 180  A1c goal < 8.0% Lab Results  Component Value Date   HGBA1C 11.0 (H) 11/02/2021    Restart rosuvastatin 20mg  take 1 tablet daily (can take with food) - this helps to prevent blockages in your blood vessels. Since you have already had bypass surgery this is very important to prevent it from happening again.   LDL goal is < 70 Lab Results  Component Value Date   LDLCALC 150 (H) 11/02/2021   Restart clopidogrel 75mg  daily - this medication also helps to prevent blockages / stroke.  I have sent an updated prescription and coupon for Humalog to [facility] - I am hoping this will help with the cost of Humalog and it will be around $35 per month. Continue to take Lantus 40 units daily and Farxiga 10mg  daily Restart metoprolol 100mg  twice a day - take with a meal/ food. This helps to lower your blood pressure. Uncontrolled blood pressure can lead to heart and kidney disease. Your blood pressure goal is < 140/90 BP Readings from Last 3 Encounters:  11/02/21 (!) 160/100  09/06/21 (!) 170/92  04/13/21 (!) 142/84   Please contact me if you have any questions or if you think you are having a side effect. We can discuss alternative medications if needed.      Cherre Robins, PharmD Clinical Pharmacist [place] [phone] (direct line)  [phone] (main office number)   The patient verbalized understanding of instructions,  educational materials, and care plan provided today and DECLINED offer to receive copy of patient instructions, educational materials, and care plan.

## 2021-11-22 NOTE — Chronic Care Management (AMB) (Signed)
Care Management   Pharmacy Note  11/22/2021 Name: Dennis HolterDarrell W Gielow MRN: [id number] DOB: 02/16/1961  Summary:  At previous appointment 2 weeks ago patient admitted to low adherence to medications and blood glucose testing. Barriers include - cost of medications - Humalog and testing supplies - Freestyle Libre Continuous Glucose Monitor. Today he reports that adherence to medications has improved. We were able to get cost of Humalog to $35 per month.  He was contacted by [employer] about Libre 2 sensors and reader but patient was confused as to why they were contacting him. Explained that his medical plan requires that he use DME for Continuous Glucose Monitor sensors. Provided phone number for [employer] [phone]. Patient to contact to check coverage of Continuous Glucose Monitor. He will contact me once Continuous Glucose Monitor received for education.   Follow up in 2 weeks  Subjective: Dennis Hopkins is a 61 y.o. year old male who is a primary care patient of Donato SchultzLowne Chase, Yvonne R, DO. The Care Management team was consulted for assistance with care management and care coordination needs.    Engaged with patient by telephone for follow up visit in response to provider referral for pharmacy case management and/or care coordination services.   The patient was given information about Care Management services today including:  Care Management services includes personalized support from designated clinical staff supervised by the patient's primary care provider, including individualized plan of care and coordination with other care providers. 24/7 contact phone numbers for assistance for urgent and routine care needs. The patient may stop case management services at any time by phone call to the office staff.  Patient agreed to services and consent obtained.  Assessment:  Review of patient status, including review of consultants reports, laboratory and other test data, was  performed as part of comprehensive evaluation and provision of chronic care management services.   SDOH (Social Determinants of Health) assessments and interventions performed:  SDOH Interventions    Flowsheet Row Most Recent Value  SDOH Interventions   Financial Strain Interventions Other (Comment)  [provided manufacturer discount cards]        Objective:  Lab Results  Component Value Date   CREATININE 1.12 11/02/2021   CREATININE 1.19 02/17/2021   CREATININE 1.3 09/23/2020    Lab Results  Component Value Date   HGBA1C 11.0 (H) 11/02/2021       Component Value Date/Time   CHOL 227 (H) 11/02/2021 1645   TRIG 177.0 (H) 11/02/2021 1645   HDL 42.10 11/02/2021 1645   CHOLHDL 5 11/02/2021 1645   VLDL 35.4 11/02/2021 1645   LDLCALC 150 (H) 11/02/2021 1645   LDLCALC 158 (H) 04/09/2020 1627   LDLDIRECT 180.0 08/24/2020 1619     Clinical ASCVD: Yes  The 10-year ASCVD risk score (Arnett DK, et al., 2019) is: 32.4%   Values used to calculate the score:     Age: [age] years     Sex: Male     Is Non-Hispanic African American: No     Diabetic: Yes     Tobacco smoker: No     Systolic Blood Pressure: 160 mmHg     Is BP treated: Yes     HDL Cholesterol: 42.1 mg/dL     Total Cholesterol: 227 mg/dL     BP Readings from Last 3 Encounters:  11/02/21 (!) 160/100  09/06/21 (!) 170/92  04/13/21 (!) 142/84    Care Plan  Allergies  Allergen Reactions   Asa [Aspirin] Anaphylaxis  and Swelling    Lips swelling    Medications Reviewed Today     Reviewed by Henrene Pastor, RPH-CPP (Pharmacist) on 11/22/21 at 1052  Med List Status: <None>   Medication Order Taking? Sig Documenting Provider Last Dose Status Informant  augmented betamethasone dipropionate (DIPROLENE AF) 0.05 % cream [id number]  Apply topically 2 (two) times daily.  Patient not taking: Reported on 11/11/2021   Donato Schultz, DO  Active   ciclopirox [facility]) 8 % solution [id number] Yes Apply topically at  bedtime. Apply over nail and surrounding skin. Apply daily over previous coat. After seven (7) days, may remove with alcohol and continue cycle. Vivi Barrack, DPM Taking Active   clopidogrel (PLAVIX) 75 MG tablet [id number] Yes Take 1 tablet (75 mg total) by mouth daily. Zola Button, Myrene Buddy R, DO Taking Active   clotrimazole-betamethasone (LOTRISONE) cream [id number]  APPLY  CREAM TOPICALLY TWICE DAILY  Patient not taking: Reported on 11/11/2021   Donato Schultz, DO  Active   Continuous Blood Gluc Sensor (FREESTYLE LIBRE 2 SENSOR) [place] [id number] No 1 Device by Does not apply route every 14 (fourteen) days.  Patient not taking: Reported on 11/11/2021   Shamleffer, Konrad Dolores, MD Not Taking Active   dapagliflozin propanediol (FARXIGA) 10 MG TABS tablet [id number] Yes Take 1 tablet (10 mg total) by mouth daily before breakfast. Zola Button, Grayling Congress, DO Taking Active   furosemide (LASIX) 20 MG tablet [id number] Yes Take 1 tablet (20 mg total) by mouth daily. Baldo Daub, MD Taking Active   gabapentin (NEURONTIN) 100 MG capsule [id number] Yes 1-3 tid po  Patient taking differently: Take 100 mg by mouth 3 (three) times daily.   Seabron Spates R, DO Taking Active   insulin glargine (LANTUS SOLOSTAR) 100 UNIT/ML Solostar Pen [id number] Yes Inject 40 Units into the skin daily. Shamleffer, Konrad Dolores, MD Taking Active   insulin lispro (HUMALOG KWIKPEN) 100 UNIT/ML KwikPen [id number] Yes Inject 8-18 Units into the skin 3 (three) times daily. Seabron Spates R, DO Taking Active   Insulin Pen Needle 32G X 4 MM MISC [id number] Yes 1 Device by Does not apply route in the morning, at noon, in the evening, and at bedtime. Shamleffer, Konrad Dolores, MD Taking Active   ketoconazole (NIZORAL) 2 % cream [id number] Yes Apply 1 application. topically daily. Vivi Barrack, DPM Taking Active   lidocaine (LIDODERM) 5 % [id number] No Place 1 patch onto the skin daily. Remove & Discard patch  within 12 hours or as directed by MD  Patient not taking: Reported on 11/11/2021   [provider] Not Taking Active   methocarbamol (ROBAXIN) 500 MG tablet [id number] Yes Take 1 tablet (500 mg total) by mouth 4 (four) times daily. Seabron Spates R, DO Taking Active   metoprolol tartrate (LOPRESSOR) 100 MG tablet [id number] Yes Take 1 tablet (100 mg total) by mouth 2 (two) times daily. Donato Schultz, DO Taking Active   rosuvastatin (CRESTOR) 20 MG tablet [id number] Yes Take 1 tablet (20 mg total) by mouth daily. Donato Schultz, DO Taking Active             Patient Active Problem List   Diagnosis Date Noted   Type 2 diabetes mellitus with diabetic peripheral angiopathy without gangrene, without long-term current use of insulin (HCC) 09/06/2021   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HCC) 09/06/2021   Peripheral neuropathic pain 09/06/2021   Type 2 diabetes mellitus with hyperglycemia, without long-term current  use of insulin (HCC) 04/13/2021   Diabetes mellitus (HCC) 04/13/2021   Hypertension    Hyperlipemia    Chickenpox    Urinary frequency 08/24/2020   Snoring 08/24/2020   Rash 08/24/2020   Kidney stones 02/14/2019   Acute left-sided low back pain with right-sided sciatica 02/14/2019   DM (diabetes mellitus) type II uncontrolled, periph vascular disorder 07/19/2016   Diabetes (HCC) 01/18/2016   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  Plan: Continue current  medications. Commended patient on improved adherence.  Patient will continue [employer] regarding Continuous Glucose Monitor sensor and reader. He will contact clinical pharmacist once received for education. Telephone follow up appointment with care management team member scheduled for:  2 to 3 weeks  Henrene Pastor, PharmD Clinical Pharmacist [facility] Primary Care [facility]

## 2021-11-24 ENCOUNTER — Other Ambulatory Visit: Payer: Self-pay

## 2021-11-24 DIAGNOSIS — M48061 Spinal stenosis, lumbar region without neurogenic claudication: Secondary | ICD-10-CM

## 2021-11-25 NOTE — Progress Notes (Signed)
Subjective:   Patient ID: Dennis Hopkins, male   DOB: 61 y.o.   MRN: [id number]   HPI 61 year old male presents the office today for concerns of thick, elongated nails he is not able to trim himself.  He also states that he has neuropathy.  He has been on gabapentin but it makes him tired.  He was on 3 times a day but not able to tolerate that as he has to work.  He also gets heel, arch discomfort.  No recent injury.  No other concerns today.   Review of Systems  All other systems reviewed and are negative.  Past Medical History:  Diagnosis Date   Chickenpox    Hyperlipemia    Hypertension    Kidney stones 2013    Past Surgical History:  Procedure Laterality Date   CARDIAC CATHETERIZATION N/A 01/07/2016   Procedure: Left Heart Cath and Coronary Angiography;  Surgeon: Jettie Booze, MD;  Location: [place] [facility];  Service: Cardiovascular;  Laterality: N/A;   CORONARY ARTERY BYPASS GRAFT N/A 01/13/2016   Procedure: CORONARY ARTERY BYPASS GRAFTING (CABG) x4 Endoscopic Harvesting of the Right Greater Saphenous Vein;  Surgeon: Melrose Nakayama, MD;  Location: [place];  Service: Open Heart Surgery;  Laterality: N/A;   TEE WITHOUT CARDIOVERSION N/A 01/13/2016   Procedure: TRANSESOPHAGEAL ECHOCARDIOGRAM (TEE);  Surgeon: Melrose Nakayama, MD;  Location: [place];  Service: Open Heart Surgery;  Laterality: N/A;   TONSILLECTOMY       Current Outpatient Medications:    ciclopirox (PENLAC) 8 % solution, Apply topically at bedtime. Apply over nail and surrounding skin. Apply daily over previous coat. After seven (7) days, may remove with alcohol and continue cycle., Disp: 6.6 mL, Rfl: 0   ketoconazole (NIZORAL) 2 % cream, Apply 1 application. topically daily., Disp: 60 g, Rfl: 2   augmented betamethasone dipropionate (DIPROLENE AF) 0.05 % cream, Apply topically 2 (two) times daily. (Patient not taking: Reported on 11/11/2021), Disp: 30 g, Rfl: 0   clopidogrel (PLAVIX) 75 MG tablet, Take  1 tablet (75 mg total) by mouth daily., Disp: 90 tablet, Rfl: 1   clotrimazole-betamethasone (LOTRISONE) cream, APPLY  CREAM TOPICALLY TWICE DAILY (Patient not taking: Reported on 11/11/2021), Disp: 30 g, Rfl: 1   Continuous Blood Gluc Sensor (FREESTYLE LIBRE 2 SENSOR) MISC, 1 Device by Does not apply route every 14 (fourteen) days. (Patient not taking: Reported on 11/11/2021), Disp: 2 each, Rfl: 6   dapagliflozin propanediol (FARXIGA) 10 MG TABS tablet, Take 1 tablet (10 mg total) by mouth daily before breakfast., Disp: 30 tablet, Rfl: 2   furosemide (LASIX) 20 MG tablet, Take 1 tablet (20 mg total) by mouth daily., Disp: 90 tablet, Rfl: 3   gabapentin (NEURONTIN) 100 MG capsule, 1-3 tid po (Patient taking differently: Take 100 mg by mouth 3 (three) times daily.), Disp: 270 capsule, Rfl: 3   insulin glargine (LANTUS SOLOSTAR) 100 UNIT/ML Solostar Pen, Inject 40 Units into the skin daily., Disp: 30 mL, Rfl: 6   insulin lispro (HUMALOG KWIKPEN) 100 UNIT/ML KwikPen, Inject 8-18 Units into the skin 3 (three) times daily., Disp: 15 mL, Rfl: 0   Insulin Pen Needle 32G X 4 MM MISC, 1 Device by Does not apply route in the morning, at noon, in the evening, and at bedtime., Disp: 400 each, Rfl: 2   lidocaine (LIDODERM) 5 %, Place 1 patch onto the skin daily. Remove & Discard patch within 12 hours or as directed by MD (Patient not taking:  Reported on 11/11/2021), Disp: , Rfl:    methocarbamol (ROBAXIN) 500 MG tablet, Take 1 tablet (500 mg total) by mouth 4 (four) times daily., Disp: 120 tablet, Rfl: 1   metoprolol tartrate (LOPRESSOR) 100 MG tablet, Take 1 tablet (100 mg total) by mouth 2 (two) times daily., Disp: 180 tablet, Rfl: 1   rosuvastatin (CRESTOR) 20 MG tablet, Take 1 tablet (20 mg total) by mouth daily., Disp: 30 tablet, Rfl: 2  Allergies  Allergen Reactions   Asa [Aspirin] Anaphylaxis and Swelling    Lips swelling           Objective:  Physical Exam  General: AAO x3, NAD  Dermatological:  Nails are hypertrophic, dystrophic, brittle, discolored, elongated 10. No surrounding redness or drainage. Tenderness nails 1-5 bilaterally.  Dry, peeling, erythematous skin consistent with tinea pedis.  No open lesions or pre-ulcerative lesions are identified today.   Vascular: Dorsalis Pedis artery and Posterior Tibial artery pedal pulses are palpable bilateral with immedate capillary fill time. There is no pain with calf compression, swelling, warmth, erythema.   Neruologic: Sensation decreased with Semmes Weinstein monofilament  Musculoskeletal: There is tenderness palpation on the plantar medial tubercle of the calcaneus although mild on the arch of the foot bilaterally.  There is no pain with lateral compression of the calcaneus.  There is no edema, erythema.  Muscular strength 5/5 in all groups tested bilateral.  Gait: Unassisted, Nonantalgic.       Assessment:   61 year old male with type 2 diabetes with neuropathy, Planter fasciitis, symptomatic onychomycosis.     Plan:  -Treatment options discussed including all alternatives, risks, and complications -Etiology of symptoms were discussed -X-rays were obtained and reviewed with the patient. 3 views obtained. No evidence of acute fracture. -Sharply debrided nails x10 without any complications or bleeding.  Prescribed Penlac as well as ketoconazole. 0 regards to the arch, heel pain we discussed shoe modifications good arch support.  Plantar fascial braces were dispensed x2 to help aid in support and stability of the plantar fascia.  Stretching, icing daily. -Continue gabapentin for neuropathy.  Glucose control.  Trula Slade DPM

## 2021-12-06 ENCOUNTER — Ambulatory Visit: Payer: PRIVATE HEALTH INSURANCE | Admitting: Pharmacist

## 2021-12-06 DIAGNOSIS — I10 Essential (primary) hypertension: Secondary | ICD-10-CM

## 2021-12-06 DIAGNOSIS — Z794 Long term (current) use of insulin: Secondary | ICD-10-CM

## 2021-12-06 DIAGNOSIS — I2510 Atherosclerotic heart disease of native coronary artery without angina pectoris: Secondary | ICD-10-CM

## 2021-12-06 DIAGNOSIS — E785 Hyperlipidemia, unspecified: Secondary | ICD-10-CM

## 2021-12-06 MED ORDER — FUROSEMIDE 20 MG PO TABS: 20.0000 mg | ORAL_TABLET | Freq: Every day | ORAL | 0 refills | Status: DC

## 2021-12-06 NOTE — Chronic Care Management (AMB) (Signed)
Care Management   Pharmacy Note  12/06/2021 Name: Dennis Hopkins MRN: [id number] DOB: [birth year]  Summary:  Type 2 DM- patient reports improved adherence to diabetes medications since our initial visit. He has been able to get Humalog at lower cost with discount card.  We are still working with [employer] to get Freestyle Libre Continuous Glucose Monitor. Received update 12/03/2021 that signed order and paper work received and is in review process. Once approved, they will ship Continuous Glucose Monitor to patient.  Reviewed medication list. He has all maintenance medications. He states he will needed furosemide refill - coordinated with his pharmacy. Will need rosuvastatin and Farxiga next week.   Follow up in 2 to 3 weeks  Subjective: Dennis Hopkins is a 61 y.o. year old male who is a primary care patient of Donato Schultz, DO. The Care Management team was consulted for assistance with care management and care coordination needs.    Engaged with patient by telephone for follow up visit in response to provider referral for pharmacy case management and/or care coordination services.   The patient was given information about Care Management services today including:  Care Management services includes personalized support from designated clinical staff supervised by the patient's primary care provider, including individualized plan of care and coordination with other care providers. 24/7 contact phone numbers for assistance for urgent and routine care needs. The patient may stop case management services at any time by phone call to the office staff.  Patient agreed to services and consent obtained.  Assessment:  Review of patient status, including review of consultants reports, laboratory and other test data, was performed as part of comprehensive evaluation and provision of chronic care management services.   SDOH (Social Determinants of Health) assessments and  interventions performed:   Recent Consult Visit:  11/16/2021 - Podiatry (Dr Ardelle Anton) Seen for bilateral foot pain.  Diagnosed with plantar fasciitis. Provided instructions on exercised to do at home for plantar fasciitis. Also prescribed ciclopirox and ketoconazole for nails.   Objective:  Lab Results  Component Value Date   CREATININE 1.12 11/02/2021   CREATININE 1.19 02/17/2021   CREATININE 1.3 09/23/2020    Lab Results  Component Value Date   HGBA1C 11.0 (H) 11/02/2021       Component Value Date/Time   CHOL 227 (H) 11/02/2021 1645   TRIG 177.0 (H) 11/02/2021 1645   HDL 42.10 11/02/2021 1645   CHOLHDL 5 11/02/2021 1645   VLDL 35.4 11/02/2021 1645   LDLCALC 150 (H) 11/02/2021 1645   LDLCALC 158 (H) 04/09/2020 1627   LDLDIRECT 180.0 08/24/2020 1619     Clinical ASCVD: Yes  The 10-year ASCVD risk score (Arnett DK, et al., 2019) is: 32.4%   Values used to calculate the score:     Age: 61 years     Sex: Male     Is Non-Hispanic African American: No     Diabetic: Yes     Tobacco smoker: No     Systolic Blood Pressure: 160 mmHg     Is BP treated: Yes     HDL Cholesterol: 42.1 mg/dL     Total Cholesterol: 227 mg/dL     BP Readings from Last 3 Encounters:  11/02/21 (!) 160/100  09/06/21 (!) 170/92  04/13/21 (!) 142/84    Care Plan  Allergies  Allergen Reactions   Asa [Aspirin] Anaphylaxis and Swelling    Lips swelling    Medications Reviewed Today  Reviewed by Henrene Pastor, RPH-CPP (Pharmacist) on 12/06/21 at 1038  Med List Status: <None>   Medication Order Taking? Sig Documenting Provider Last Dose Status Informant  augmented betamethasone dipropionate (DIPROLENE AF) 0.05 % cream [id number] Yes Apply topically 2 (two) times daily. Seabron Spates R, DO Taking Active   ciclopirox (PENLAC) 8 % solution [id number] Yes Apply topically at bedtime. Apply over nail and surrounding skin. Apply daily over previous coat. After seven (7) days, may remove with  alcohol and continue cycle. Vivi Barrack, DPM Taking Active   clopidogrel (PLAVIX) 75 MG tablet [id number] Yes Take 1 tablet (75 mg total) by mouth daily. Zola Button, Myrene Buddy R, DO Taking Active   clotrimazole-betamethasone Thurmond Butts) cream [id number] Yes APPLY  CREAM TOPICALLY TWICE DAILY Zola Button, Grayling Congress, DO Taking Active   Continuous Blood Gluc Sensor (FREESTYLE LIBRE 2 SENSOR) MISC [id number] Yes 1 Device by Does not apply route every 14 (fourteen) days. Shamleffer, Konrad Dolores, MD Taking Active   dapagliflozin propanediol (FARXIGA) 10 MG TABS tablet [id number] Yes Take 1 tablet (10 mg total) by mouth daily before breakfast. Zola Button, Grayling Congress, DO Taking Active   furosemide (LASIX) 20 MG tablet [id number] Yes Take 1 tablet (20 mg total) by mouth daily. Baldo Daub, MD Taking Active   gabapentin (NEURONTIN) 100 MG capsule [id number] Yes 1-3 tid po  Patient taking differently: Take 100 mg by mouth 3 (three) times daily.   Seabron Spates R, DO Taking Active   insulin glargine (LANTUS SOLOSTAR) 100 UNIT/ML Solostar Pen [id number] Yes Inject 40 Units into the skin daily. Shamleffer, Konrad Dolores, MD Taking Active   insulin lispro (HUMALOG KWIKPEN) 100 UNIT/ML KwikPen [id number] Yes Inject 8-18 Units into the skin 3 (three) times daily. Seabron Spates R, DO Taking Active   Insulin Pen Needle 32G X 4 MM MISC [id number] Yes 1 Device by Does not apply route in the morning, at noon, in the evening, and at bedtime. Shamleffer, Konrad Dolores, MD Taking Active   ketoconazole (NIZORAL) 2 % cream [id number] Yes Apply 1 application. topically daily. Vivi Barrack, DPM Taking Active   lidocaine (LIDODERM) 5 % [id number] Yes Place 1 patch onto the skin daily. Remove & Discard patch within 12 hours or as directed by MD [provider] Taking Active   methocarbamol (ROBAXIN) 500 MG tablet [id number] Yes Take 1 tablet (500 mg total) by mouth 4 (four) times daily. Seabron Spates R, DO Taking Active   metoprolol tartrate (LOPRESSOR) 100 MG tablet [id number] Yes Take 1 tablet (100 mg total) by mouth 2 (two) times daily. Donato Schultz, DO Taking Active   rosuvastatin (CRESTOR) 20 MG tablet [id number] Yes Take 1 tablet (20 mg total) by mouth daily. Donato Schultz, DO Taking Active             Patient Active Problem List   Diagnosis Date Noted   Type 2 diabetes mellitus with diabetic peripheral angiopathy without gangrene, without long-term current use of insulin (HCC) 09/06/2021   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HCC) 09/06/2021   Peripheral neuropathic pain 09/06/2021   Type 2 diabetes mellitus with hyperglycemia, without long-term current use of insulin (HCC) 04/13/2021   Diabetes mellitus (HCC) 04/13/2021   Hypertension    Hyperlipemia    Chickenpox    Urinary frequency 08/24/2020   Snoring 08/24/2020   Rash 08/24/2020   Kidney stones 02/14/2019   Acute left-sided low back pain with right-sided sciatica 02/14/2019   DM (  diabetes mellitus) type II uncontrolled, periph vascular disorder 07/19/2016   Diabetes (HCC) 01/18/2016   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  Plan: Continue current medications. Commended patient on improved adherence.  Will continue to work with Coordinated refill for furosemide with [facility].  Telephone follow up appointment with care management team member scheduled for:  2 to 3 weeks  Henrene Pastor,  PharmD Clinical Pharmacist [facility] Primary Care [facility]

## 2021-12-27 ENCOUNTER — Ambulatory Visit: Payer: PRIVATE HEALTH INSURANCE | Admitting: Pharmacist

## 2021-12-27 DIAGNOSIS — I10 Essential (primary) hypertension: Secondary | ICD-10-CM

## 2021-12-27 DIAGNOSIS — E785 Hyperlipidemia, unspecified: Secondary | ICD-10-CM

## 2021-12-27 DIAGNOSIS — Z794 Long term (current) use of insulin: Secondary | ICD-10-CM

## 2021-12-27 MED ORDER — METOPROLOL SUCCINATE ER 200 MG PO TB24: 200.0000 mg | ORAL_TABLET | Freq: Every day | ORAL | 2 refills | Status: DC

## 2021-12-27 NOTE — Chronic Care Management (AMB) (Signed)
Care Management   Pharmacy Note  12/27/2021 Name: Dennis Hopkins MRN: [id number] DOB: [birth year]  Summary:  Type 2 DM- patient reports improved adherence to diabetes medications since our initial visit. He has been able to get Humalog at lower cost with discount card.  I tried to get [employer] thru DME but initial company, [employer], reported they are not contacted for this service. I did ask [facility] to try to run at the pharmacy level but cost was still $74.99 per month which is too expensive for patient. I also called his insurance [employer] and they state that Continuous Glucose Monitor is covered at 80% after $500 deductible - reports patient has met $500 deductible. They also state that there is no particular DME company that has to be used or that is preferred.  Sent request to [facility] for Continuous Glucose Monitor.  Hyperlipidemia: due to refill rosuvastatin - requested refill when called [facility] at patient request.   Hypertension: Patient report missing morning dose of metoprolol tartrate 14m twice a day. Changed to Metoprolol succinate ER 2055monce daily to improve adherence. Goal blood pressure < 140/90.   Follow up in 2 to 3 weeks  Subjective: Dennis Hopkins a [age].o. year old male who is a primary care patient of LoAnn HeldDO. The Care Management team was consulted for assistance with care management and care coordination needs.    Engaged with patient by telephone for follow up visit in response to provider referral for pharmacy case management and/or care coordination services.   The patient was given information about Care Management services today including:  Care Management services includes personalized support from designated clinical staff supervised by the patient's primary care provider, including individualized plan of care and coordination with other care providers. 24/7 contact phone numbers for assistance for  urgent and routine care needs. The patient may stop case management services at any time by phone call to the office staff.  Patient agreed to services and consent obtained.  Assessment:  Review of patient status, including review of consultants reports, laboratory and other test data, was performed as part of comprehensive evaluation and provision of chronic care management services.   SDOH (Social Determinants of Health) assessments and interventions performed:   Recent Consult Visit:  11/16/2021 - Podiatry (Dr WaJacqualyn PoseySeen for bilateral foot pain.  Diagnosed with plantar fasciitis. Provided instructions on exercised to do at home for plantar fasciitis. Also prescribed ciclopirox and ketoconazole for nails.   Objective:  Lab Results  Component Value Date   CREATININE 1.12 11/02/2021   CREATININE 1.19 02/17/2021   CREATININE 1.3 09/23/2020    Lab Results  Component Value Date   HGBA1C 11.0 (H) 11/02/2021       Component Value Date/Time   CHOL 227 (H) 11/02/2021 1645   TRIG 177.0 (H) 11/02/2021 1645   HDL 42.10 11/02/2021 1645   CHOLHDL 5 11/02/2021 1645   VLDL 35.4 11/02/2021 1645   LDLCALC 150 (H) 11/02/2021 1645   LDLCALC 158 (H) 04/09/2020 1627   LDLDIRECT 180.0 08/24/2020 1619     Clinical ASCVD: Yes  The 10-year ASCVD risk score (Arnett DK, et al., 2019) is: 32.4%   Values used to calculate the score:     Age: 89ears     Sex: Male     Is Non-Hispanic African American: No     Diabetic: Yes     Tobacco smoker: No  Systolic Blood Pressure: 007 mmHg     Is BP treated: Yes     HDL Cholesterol: 42.1 mg/dL     Total Cholesterol: 227 mg/dL     BP Readings from Last 3 Encounters:  11/02/21 (!) 160/100  09/06/21 (!) 170/92  04/13/21 (!) 142/84    Care Plan  Allergies  Allergen Reactions   Asa [Aspirin] Anaphylaxis and Swelling    Lips swelling    Medications Reviewed Today     Reviewed by Cherre Robins, RPH-CPP (Pharmacist) on 12/27/21 at [phone]   Med List Status: <None>   Medication Order Taking? Sig Documenting Provider Last Dose Status Informant  augmented betamethasone dipropionate (DIPROLENE AF) 0.05 % cream [id number] No Apply topically 2 (two) times daily.  Patient not taking: Reported on 12/27/2021   [name], DO Not Taking Active   ciclopirox [facility]) 8 % solution [id number] Yes Apply topically at bedtime. Apply over nail and surrounding skin. Apply daily over previous coat. After seven (7) days, may remove with alcohol and continue cycle. Trula Slade, DPM Taking Active   clopidogrel (PLAVIX) 75 MG tablet [id number] Yes Take 1 tablet (75 mg total) by mouth daily. Carollee Herter, Kendrick Fries R, DO Taking Active   clotrimazole-betamethasone (LOTRISONE) cream [id number] No APPLY  CREAM TOPICALLY TWICE DAILY  Patient not taking: Reported on 12/27/2021   [name], DO Not Taking Active   Continuous Blood Gluc Sensor (FREESTYLE LIBRE 2 SENSOR) [place] [id number] No 1 Device by Does not apply route every 14 (fourteen) days.  Patient not taking: Reported on 12/27/2021   Shamleffer, Melanie Crazier, MD Not Taking Active   dapagliflozin propanediol (FARXIGA) 10 MG TABS tablet [id number] Yes Take 1 tablet (10 mg total) by mouth daily before breakfast. Carollee Herter, Alferd Apa, DO Taking Active   furosemide (LASIX) 20 MG tablet [id number] Yes Take 1 tablet (20 mg total) by mouth daily. [name], DO Taking Active   gabapentin (NEURONTIN) 100 MG capsule [id number] Yes 1-3 tid po  Patient taking differently: Take 100 mg by mouth 3 (three) times daily.   Roma Schanz R, DO Taking Active   insulin glargine (LANTUS SOLOSTAR) 100 UNIT/ML Solostar Pen [id number] Yes Inject 40 Units into the skin daily. Shamleffer, Melanie Crazier, MD Taking Active   insulin lispro (HUMALOG KWIKPEN) 100 UNIT/ML KwikPen [id number] Yes Inject 8-18 Units into the skin 3 (three) times daily. Roma Schanz R, DO Taking Active   Insulin Pen  Needle 32G X 4 MM MISC [id number]  1 Device by Does not apply route in the morning, at noon, in the evening, and at bedtime. Shamleffer, Melanie Crazier, MD  Active   ketoconazole (NIZORAL) 2 % cream [id number] Yes Apply 1 application. topically daily. Trula Slade, DPM Taking Active   lidocaine (LIDODERM) 5 % [id number] Yes Place 1 patch onto the skin daily. Remove & Discard patch within 12 hours or as directed by MD [provider] Taking Active   methocarbamol (ROBAXIN) 500 MG tablet [id number] Yes Take 1 tablet (500 mg total) by mouth 4 (four) times daily. Roma Schanz R, DO Taking Active   metoprolol tartrate (LOPRESSOR) 100 MG tablet [id number] Yes Take 1 tablet (100 mg total) by mouth 2 (two) times daily. [name], DO Taking Active   rosuvastatin (CRESTOR) 20 MG tablet [id number] Yes Take 1 tablet (20 mg total) by mouth daily. 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[place]) 09/06/2021   Peripheral neuropathic pain 09/06/2021   Type 2 diabetes mellitus with hyperglycemia, without long-term current use of insulin ([place]) 04/13/2021   Diabetes mellitus ([place]) 04/13/2021   Hypertension    Hyperlipemia    Chickenpox    Urinary frequency 08/24/2020   Snoring 08/24/2020   Rash 08/24/2020   Kidney stones 02/14/2019   Acute left-sided low back pain with right-sided sciatica 02/14/2019   DM (diabetes mellitus) type II uncontrolled, periph vascular disorder 07/19/2016   Diabetes ([place]) 01/18/2016   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   Follow Up:  Patient agrees to Care Plan and Follow-up.  Plan: Commended patient on improved adherence.  Will continue to work with patient and insurance to try to get Continuous Glucose Sensors.Sent request to [facility] for Continuous Glucose Monitor. Coordinated refill for rosuvastatin with [facility].  Telephone follow up appointment with care management team member scheduled for:  2 to 3 weeks  Cherre Robins, PharmD Clinical Pharmacist [facility] Primary Care [place] [facility]

## 2022-01-04 ENCOUNTER — Ambulatory Visit: Payer: PRIVATE HEALTH INSURANCE | Admitting: Orthopaedic Surgery

## 2022-01-04 ENCOUNTER — Encounter: Payer: Self-pay | Admitting: Orthopaedic Surgery

## 2022-01-04 DIAGNOSIS — M48061 Spinal stenosis, lumbar region without neurogenic claudication: Secondary | ICD-10-CM | POA: Diagnosis not present

## 2022-01-04 NOTE — Progress Notes (Unsigned)
Office Visit Note   Patient: Dennis Hopkins           Date of Birth: 09-17-1960           MRN: [id number] Visit Date: 01/04/2022              Requested by: [address] [place] [address] [place], [place] [address] [place],  [place] [address] PCP: Zola Button, Grayling Congress, DO   Assessment & Plan: Visit Diagnoses: No diagnosis found.  Plan: ***  Follow-Up Instructions: No follow-ups on file.   Orders:  No orders of the defined types were placed in this encounter.  No orders of the defined types were placed in this encounter.     Procedures: No procedures performed   Clinical Data: No additional findings.   Subjective: Chief Complaint  Patient presents with   Lower Back - Pain    HPI  Review of Systems   Objective: Vital Signs: BP (!) 144/80   Pulse 71   Ht 5\' 10"  (1.778 m)   Wt 240 lb (108.9 kg)   BMI 34.44 kg/m   Physical Exam  Ortho Exam  Specialty Comments:  No specialty comments available.  Imaging: No results found.   PMFS History: Patient Active Problem List   Diagnosis Date Noted   Type 2 diabetes mellitus with diabetic peripheral angiopathy without gangrene, without long-term current use of insulin (HCC) 09/06/2021   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HCC) 09/06/2021   Peripheral neuropathic pain 09/06/2021   Type 2 diabetes mellitus with hyperglycemia, without long-term current use of insulin (HCC) 04/13/2021   Diabetes mellitus (HCC) 04/13/2021   Hypertension    Hyperlipemia    Chickenpox    Urinary frequency 08/24/2020   Snoring 08/24/2020   Rash 08/24/2020   Kidney stones 02/14/2019   Acute left-sided low back pain with right-sided sciatica 02/14/2019   DM (diabetes mellitus) type II uncontrolled, periph vascular disorder 07/19/2016   Diabetes (HCC) 01/18/2016   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   Coronary artery disease 01/12/2016   Exertional angina (HCC)    Chest pain 11/11/2015   HTN (hypertension) 10/20/2015   Hyperlipidemia LDL goal <70  10/20/2015   Preventative health care 09/29/2014   Obesity (BMI 30-39.9) 09/26/2013   Tobacco use disorder 09/26/2013   Past Medical History:  Diagnosis Date   Chickenpox    Hyperlipemia    Hypertension    Kidney stones 2013    Family History  Problem Relation Age of Onset   Arthritis Mother    Diabetes Mother    Arthritis Father    Hyperlipidemia Father    Diabetes Father    Diabetes Brother     Past Surgical History:  Procedure Laterality Date   CARDIAC CATHETERIZATION N/A 01/07/2016   Procedure: Left Heart Cath and Coronary Angiography;  Surgeon: 01/09/2016, MD;  Location: [facility] INVASIVE CV LAB;  Service: Cardiovascular;  Laterality: N/A;   CORONARY ARTERY BYPASS GRAFT N/A 01/13/2016   Procedure: CORONARY ARTERY BYPASS GRAFTING (CABG) x4 Endoscopic Harvesting of the Right Greater Saphenous Vein;  Surgeon: 01/15/2016, MD;  Location: [facility] OR;  Service: Open Heart Surgery;  Laterality: N/A;   TEE WITHOUT CARDIOVERSION N/A 01/13/2016   Procedure: TRANSESOPHAGEAL ECHOCARDIOGRAM (TEE);  Surgeon: 01/15/2016, MD;  Location: [facility] OR;  Service: Open Heart Surgery;  Laterality: N/A;   TONSILLECTOMY     Social History   Occupational History   Not on file  Tobacco Use  Smoking status: Former    Packs/day: 2.00    Years: 35.00    Total pack years: 70.00    Types: Cigarettes    Quit date: 12/21/2013    Years since quitting: 8.0   Smokeless tobacco: Never  Vaping Use   Vaping Use: Never used  Substance and Sexual Activity   Alcohol use: Yes    Alcohol/week: 0.0 standard drinks of alcohol    Comment: 1-2 drinks per night- wife reports he drinks more    Drug use: No   Sexual activity: Yes    Partners: Female

## 2022-01-05 DIAGNOSIS — M48061 Spinal stenosis, lumbar region without neurogenic claudication: Secondary | ICD-10-CM | POA: Insufficient documentation

## 2022-01-05 HISTORY — DX: Spinal stenosis, lumbar region without neurogenic claudication: M48.061

## 2022-01-13 ENCOUNTER — Ambulatory Visit: Payer: PRIVATE HEALTH INSURANCE | Admitting: Pharmacist

## 2022-01-13 DIAGNOSIS — Z794 Long term (current) use of insulin: Secondary | ICD-10-CM

## 2022-01-13 DIAGNOSIS — I10 Essential (primary) hypertension: Secondary | ICD-10-CM

## 2022-01-13 DIAGNOSIS — E785 Hyperlipidemia, unspecified: Secondary | ICD-10-CM

## 2022-01-13 NOTE — Chronic Care Management (AMB) (Signed)
Care Management   Pharmacy Note  01/13/2022 Name: Dennis Hopkins MRN: [id number] DOB: 1961/02/28  Summary:  Type 2 DM- patient reports improved adherence to diabetes medications but he is not checking blood glucose regularly. Using discount cards for insulins.   I tried to get [employer] thru DME but initial company, [employer], reported they are not contacted for this service. Also asked [facility] to try to run at the pharmacy level but cost was still $74.99 per month which is too expensive for patient. Previously called his insurance Dennis Hopkins and they state that Continuous Glucose Monitor is covered at 80% after $500 deductible - reports patient has met $500 deductible. They also state that there is no particular DME company that has to be used or that is preferred.  I sent request for Continuous Glucose Monitor to [facility] 01/03/2022. Patient reports he has been contact for information by [facility] regarding Continuous Glucose Monitor but has not been contacted regarding patient cost or shipping. [place] and they report that order is in process - they are currently verifying insurance coverage.  Offered patient a sample of Freestyle Libre 2 since he has a reader (his wife's but she is not using) at home. Patient has planned to come into office today this afternoon with his wife but was held up at work. He will contact me next week about coming by for Continuous Glucose Monitor education and to place sample sensor.  Hyperlipidemia: Taking rosuvastatin 46m daily - last filled 12/27/2021 for 30 day supply Hypertension: blood pressure has improved since changed to metoprolol ER 2035mdaily. Adherence has improved.    Subjective: Dennis Hopkins a [age].o. year old male who is a primary care patient of LoAnn HeldDO. The Care Management team was consulted for assistance with care management and care coordination needs.    Engaged with  patient face to face for follow up visit in response to provider referral for pharmacy case management and/or care coordination services.   The patient was given information about Care Management services today including:  Care Management services includes personalized support from designated clinical staff supervised by the patient's primary care provider, including individualized plan of care and coordination with other care providers. 24/7 contact phone numbers for assistance for urgent and routine care needs. The patient may stop case management services at any time by phone call to the office staff.  Patient agreed to services and consent obtained.  Assessment:  Review of patient status, including review of consultants reports, laboratory and other test data, was performed as part of comprehensive evaluation and provision of chronic care management services.   SDOH (Social Determinants of Hopkins) assessments and interventions performed:   Recent Consult Visit:  01/04/2022 - Orthopedics (Dr YaLorin MercySeen for lumbar foraminal stenosis.  Discussed with patient poorly controlled diabetes is contributing to s pain and discomfort. Not an operative candidate due to his uncontrolled diabetes and needs to improve his diabetic compliance which should help his symptoms.  Follow-up  as-needed.  11/16/2021 - Podiatry (Dr WaJacqualyn PoseySeen for bilateral foot pain.  Diagnosed with plantar fasciitis. Provided instructions on exercised to do at home for plantar fasciitis. Also prescribed ciclopirox and ketoconazole for nails.   Objective:  Lab Results  Component Value Date   CREATININE 1.12 11/02/2021   CREATININE 1.19 02/17/2021   CREATININE 1.3 09/23/2020    Lab Results  Component Value Date   HGBA1C 11.0 (H) 11/02/2021  Component Value Date/Time   CHOL 227 (H) 11/02/2021 1645   TRIG 177.0 (H) 11/02/2021 1645   HDL 42.10 11/02/2021 1645   CHOLHDL 5 11/02/2021 1645   VLDL 35.4 11/02/2021  1645   LDLCALC 150 (H) 11/02/2021 1645   LDLCALC 158 (H) 04/09/2020 1627   LDLDIRECT 180.0 08/24/2020 1619     Clinical ASCVD: Yes  The 10-year ASCVD risk score (Arnett DK, et al., 2019) is: 27.6%   Values used to calculate the score:     Age: 61 years     Sex: Male     Is Non-Hispanic African American: No     Diabetic: Yes     Tobacco smoker: No     Systolic Blood Pressure: 694 mmHg     Is BP treated: Yes     HDL Cholesterol: 42.1 mg/dL     Total Cholesterol: 227 mg/dL     BP Readings from Last 3 Encounters:  01/04/22 (!) 144/80  11/02/21 (!) 160/100  09/06/21 (!) 170/92    Care Plan  Allergies  Allergen Reactions   Asa [Aspirin] Anaphylaxis and Swelling    Lips swelling    Medications Reviewed Today     Reviewed by Elvin So L, RT (Technologist) on 01/04/22 at 61  Med List Status: <None>   Medication Order Taking? Sig Documenting Provider Last Dose Status Informant  augmented betamethasone dipropionate (DIPROLENE AF) 0.05 % cream [id number] No Apply topically 2 (two) times daily.  Patient not taking: Reported on 12/27/2021   [name], DO Not Taking Active   ciclopirox [facility]) 8 % solution [id number] No Apply topically at bedtime. Apply over nail and surrounding skin. Apply daily over previous coat. After seven (7) days, may remove with alcohol and continue cycle. Trula Slade, DPM Taking Active   clopidogrel (PLAVIX) 75 MG tablet [id number] No Take 1 tablet (75 mg total) by mouth daily. Carollee Herter, Kendrick Fries R, DO Taking Active   clotrimazole-betamethasone (LOTRISONE) cream [id number] No APPLY  CREAM TOPICALLY TWICE DAILY  Patient not taking: Reported on 12/27/2021   [name], DO Not Taking Active   Continuous Blood Gluc Sensor (FREESTYLE LIBRE 2 SENSOR) [place] [id number] No 1 Device by Does not apply route every 14 (fourteen) days.  Patient not taking: Reported on 12/27/2021   Shamleffer, Melanie Crazier, MD Not Taking Active    dapagliflozin propanediol (FARXIGA) 10 MG TABS tablet [id number] No Take 1 tablet (10 mg total) by mouth daily before breakfast. Carollee Herter, Alferd Apa, DO Taking Active   furosemide (LASIX) 20 MG tablet [id number] No Take 1 tablet (20 mg total) by mouth daily. [name], DO Taking Active   gabapentin (NEURONTIN) 100 MG capsule [id number] No 1-3 tid po  Patient taking differently: Take 100 mg by mouth 3 (three) times daily.   Roma Schanz R, DO Taking Active   insulin glargine (LANTUS SOLOSTAR) 100 UNIT/ML Solostar Pen [id number] No Inject 40 Units into the skin daily. Shamleffer, Melanie Crazier, MD Taking Active   insulin lispro (HUMALOG KWIKPEN) 100 UNIT/ML KwikPen [id number] No Inject 8-18 Units into the skin 3 (three) times daily. Roma Schanz R, DO Taking Active   Insulin Pen Needle 32G X 4 MM MISC [id number] No 1 Device by Does not apply route in the morning, at noon, in the evening, and at bedtime. Shamleffer, Melanie Crazier, MD Taking Active   ketoconazole (NIZORAL) 2 % cream [id number] No Apply 1 application. topically daily. Trula Slade, DPM  Taking Active   lidocaine (LIDODERM) 5 % [id number] No Place 1 patch onto the skin daily. Remove & Discard patch within 12 hours or as directed by MD [provider] Taking Active   methocarbamol (ROBAXIN) 500 MG tablet [id number] No Take 1 tablet (500 mg total) by mouth 4 (four) times daily. Roma Schanz R, DO Taking Active   metoprolol (TOPROL-XL) 200 MG 24 hr tablet [id number]  Take 1 tablet (200 mg total) by mouth daily. Roma Schanz R, DO  Active   rosuvastatin (CRESTOR) 20 MG tablet [id number] No Take 1 tablet (20 mg total) by mouth daily. 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   Peripheral neuropathic pain 09/06/2021   Type 2 diabetes mellitus with hyperglycemia, without long-term current use of insulin ([place]) 04/13/2021   Diabetes mellitus ([place]) 04/13/2021   Hypertension    Hyperlipemia    Chickenpox    Urinary frequency 08/24/2020   Snoring 08/24/2020   Rash 08/24/2020   Kidney stones 02/14/2019   Acute left-sided low back pain with right-sided sciatica 02/14/2019   DM (diabetes mellitus) type II uncontrolled, periph vascular disorder 07/19/2016   Diabetes ([place]) 01/18/2016   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   Follow Up:  Patient agrees to Care Plan and Follow-up.  Plan: Commended patient on improved adherence but blood glucose is not at goal. Need blood glucose readings to help adjust medications.  Will continue to work with patient and insurance to try to get Continuous Glucose Sensors through [facility]  Patient will come in for Continuous Glucose Monitor education and to get Continuous Glucose Monitor sample.  Telephone follow up appointment with care management team member scheduled for:  2 to 3 weeks Face to Face appointment with care management team member scheduled for: 1 week  Cherre Robins, PharmD Clinical Pharmacist [place] [facility] [address] [place] [facility]

## 2022-01-18 ENCOUNTER — Ambulatory Visit: Payer: PRIVATE HEALTH INSURANCE | Admitting: Podiatry

## 2022-01-28 ENCOUNTER — Ambulatory Visit: Payer: PRIVATE HEALTH INSURANCE | Admitting: Pharmacist

## 2022-01-28 DIAGNOSIS — Z794 Long term (current) use of insulin: Secondary | ICD-10-CM

## 2022-01-28 DIAGNOSIS — E1159 Type 2 diabetes mellitus with other circulatory complications: Secondary | ICD-10-CM

## 2022-01-28 MED ORDER — FREESTYLE LIBRE 2 SENSOR MISC: 1 refills | Status: DC

## 2022-02-03 NOTE — Chronic Care Management (AMB) (Signed)
Care Management   Pharmacy Note  01/28/2022 Name: Dennis Hopkins MRN: [id number] DOB: [birth year]  Summary:  Today's phone visit was to follow up Continuous Glucose Monitor order. Patient reports he was contacted by [facility] to verify insurance and demographic information about Continuous Glucose Monitor order but has not heard back from them yet. [facility] and spoke with Godfrey Pick [phone]).   Mrs. Matson states [facility] was not able to complete order for Continuous Glucose Monitor because of the way that patient's insurance reimburses for Continuous Glucose Monitor. Per Mrs. Rica Records they are not able to supply to insurances that use reference based pricing.  Reached out to [employer] who suggested trying to see if [employer] accepts patient's insurance coverage for Continuous Glucose Monitor. Submitted patient's demographic and insurance information on [place] portal.  01/13/2022: Tried to get Freestyle Libre thru [facility], reported they are not contacted for this service. Also asked [facility] to try to run at the pharmacy level but cost was still $74.99 per month which is too expensive for patient. Previously called his insurance [employer] and they state that Continuous Glucose Monitor is covered at 80% after $500 deductible - reports patient has met $500 deductible. They also state that there is no particular DME company that has to be used or that is preferred. I sent request for Continuous Glucose Monitor to [facility] 01/03/2022. Offered patient a sample of Freestyle Libre 2 since he has a reader (his wife's but she is not using) at home. Patient had planned to come into office today this afternoon with his wife but was held up at work.    Subjective: Dennis Hopkins is a 61 y.o. year old male who is a primary care patient of [name], DO. The Care Management team was consulted for assistance with care  management and care coordination needs.    Engaged with patient by telephone for follow up visit in response to provider referral for pharmacy case management and/or care coordination services.   The patient was given information about Care Management services today including:  Care Management services includes personalized support from designated clinical staff supervised by the patient's primary care provider, including individualized plan of care and coordination with other care providers. 24/7 contact phone numbers for assistance for urgent and routine care needs. The patient may stop case management services at any time by phone call to the office staff.  Patient agreed to services and consent obtained.  Assessment:  Review of patient status, including review of consultants reports, laboratory and other test data, was performed as part of comprehensive evaluation and provision of chronic care management services.   SDOH (Social Determinants of Health) assessments and interventions performed:   Recent Consult Visit:  01/04/2022 - Orthopedics (Dr Lorin Mercy) Seen for lumbar foraminal stenosis.  Discussed with patient poorly controlled diabetes is contributing to s pain and discomfort. Not an operative candidate due to his uncontrolled diabetes and needs to improve his diabetic compliance which should help his symptoms.  Follow-up  as-needed.  11/16/2021 - Podiatry (Dr Jacqualyn Posey) Seen for bilateral foot pain.  Diagnosed with plantar fasciitis. Provided instructions on exercised to do at home for plantar fasciitis. Also prescribed ciclopirox and ketoconazole for nails.   Objective:  Lab Results  Component Value Date   CREATININE 1.12 11/02/2021   CREATININE 1.19 02/17/2021   CREATININE 1.3 09/23/2020    Lab Results  Component Value Date   HGBA1C  11.0 (H) 11/02/2021       Component Value Date/Time   CHOL 227 (H) 11/02/2021 1645   TRIG 177.0 (H) 11/02/2021 1645   HDL 42.10 11/02/2021  1645   CHOLHDL 5 11/02/2021 1645   VLDL 35.4 11/02/2021 1645   LDLCALC 150 (H) 11/02/2021 1645   LDLCALC 158 (H) 04/09/2020 1627   LDLDIRECT 180.0 08/24/2020 1619     Clinical ASCVD: Yes  The 10-year ASCVD risk score (Arnett DK, et al., 2019) is: 27.6%   Values used to calculate the score:     Age: 6 years     Sex: Male     Is Non-Hispanic African American: No     Diabetic: Yes     Tobacco smoker: No     Systolic Blood Pressure: 071 mmHg     Is BP treated: Yes     HDL Cholesterol: 42.1 mg/dL     Total Cholesterol: 227 mg/dL     BP Readings from Last 3 Encounters:  01/04/22 (!) 144/80  11/02/21 (!) 160/100  09/06/21 (!) 170/92    Care Plan  Allergies  Allergen Reactions   Asa [Aspirin] Anaphylaxis and Swelling    Lips swelling    Medications Reviewed Today     Reviewed by Cherre Robins, RPH-CPP (Pharmacist) on 01/14/22 at 1245  Med List Status: <None>   Medication Order Taking? Sig Documenting Provider Last Dose Status Informant  augmented betamethasone dipropionate (DIPROLENE AF) 0.05 % cream [id number] Yes Apply topically 2 (two) times daily. Roma Schanz R, DO Taking Active   ciclopirox (PENLAC) 8 % solution [id number] Yes Apply topically at bedtime. Apply over nail and surrounding skin. Apply daily over previous coat. After seven (7) days, may remove with alcohol and continue cycle. Trula Slade, DPM Taking Active   clopidogrel (PLAVIX) 75 MG tablet [id number] Yes Take 1 tablet (75 mg total) by mouth daily. Carollee Herter, Kendrick Fries R, DO Taking Active   clotrimazole-betamethasone Donalynn Furlong) cream [id number] Yes APPLY  CREAM TOPICALLY TWICE DAILY Carollee Herter, Alferd Apa, DO Taking Active   Continuous Blood Gluc Sensor (FREESTYLE LIBRE 2 SENSOR) MISC [id number] Yes 1 Device by Does not apply route every 14 (fourteen) days. Shamleffer, Melanie Crazier, MD Taking Active   dapagliflozin propanediol (FARXIGA) 10 MG TABS tablet [id number] Yes Take 1 tablet (10 mg total)  by mouth daily before breakfast. Carollee Herter, Alferd Apa, DO Taking Active   furosemide (LASIX) 20 MG tablet [id number] Yes Take 1 tablet (20 mg total) by mouth daily. [name], DO Taking Active   gabapentin (NEURONTIN) 100 MG capsule [id number] Yes 1-3 tid po  Patient taking differently: Take 100 mg by mouth 3 (three) times daily.   Roma Schanz R, DO Taking Active   insulin glargine (LANTUS SOLOSTAR) 100 UNIT/ML Solostar Pen [id number] Yes Inject 40 Units into the skin daily. Shamleffer, Melanie Crazier, MD Taking Active   insulin lispro (HUMALOG KWIKPEN) 100 UNIT/ML KwikPen [id number] Yes Inject 8-18 Units into the skin 3 (three) times daily. Roma Schanz R, DO Taking Active   Insulin Pen Needle 32G X 4 MM MISC [id number] Yes 1 Device by Does not apply route in the morning, at noon, in the evening, and at bedtime. Shamleffer, Melanie Crazier, MD Taking Active   ketoconazole (NIZORAL) 2 % cream [id number] Yes Apply 1 application. topically daily. Trula Slade, DPM Taking Active   lidocaine (LIDODERM) 5 % [id number] Yes Place 1 patch onto the skin daily. Remove & Discard patch within  12 hours or as directed by MD [provider] Taking Active   methocarbamol (ROBAXIN) 500 MG tablet [id number] Yes Take 1 tablet (500 mg total) by mouth 4 (four) times daily. Roma Schanz R, DO Taking Active   metoprolol (TOPROL-XL) 200 MG 24 hr tablet [id number] Yes Take 1 tablet (200 mg total) by mouth daily. [name], DO Taking Active   rosuvastatin (CRESTOR) 20 MG tablet [id number] Yes Take 1 tablet (20 mg total) by mouth daily. 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[place]) 09/06/2021    Peripheral neuropathic pain 09/06/2021   Type 2 diabetes mellitus with hyperglycemia, without long-term current use of insulin ([place]) 04/13/2021   Diabetes mellitus ([place]) 04/13/2021   Hypertension    Hyperlipemia    Chickenpox    Urinary frequency 08/24/2020   Snoring 08/24/2020   Rash 08/24/2020   Kidney stones 02/14/2019   Acute left-sided low back pain with right-sided sciatica 02/14/2019   DM (diabetes mellitus) type II uncontrolled, periph vascular disorder 07/19/2016   Diabetes ([place]) 01/18/2016   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   Follow Up:  Patient agrees to Care Plan and Follow-up.  Plan: Sent information to try to see if patient would be able to get Continuous Glucose Monitor form [facility]. Order / coverage verification is pending. Will continue to follow.   Telephone follow up appointment with care management team member scheduled for:  1 to 2 weeks  Cherre Robins, PharmD Clinical Pharmacist [facility]

## 2022-02-04 ENCOUNTER — Telehealth: Payer: Self-pay | Admitting: Pharmacist

## 2022-02-04 MED ORDER — DEXCOM G7 SENSOR MISC: 1 refills | Status: DC

## 2022-02-04 NOTE — Telephone Encounter (Signed)
Last week sent RX for Freestyle Libre Continuous Glucose Monitor to [facility]. Called today to check status of order. Per rep was not filled because was non formulary. Representative checked and report DexCom 6 or 7 sensors are covered with $0 cost to patient.  Spoke with patient about DexCom 6 versus 7. Verified that his Samsung 22 is compatible with DexCom 6 or 7.  Patient would like to try DexCom 7. Rx sent to [facility].  Once patient receives order he will contact office for appointment for Continuous Glucose Monitor education and initiation.

## 2022-02-07 ENCOUNTER — Ambulatory Visit (INDEPENDENT_AMBULATORY_CARE_PROVIDER_SITE_OTHER): Payer: PRIVATE HEALTH INSURANCE | Admitting: Family Medicine

## 2022-02-07 ENCOUNTER — Encounter: Payer: Self-pay | Admitting: Family Medicine

## 2022-02-07 VITALS — BP 120/76 | HR 77 | Temp 99.0°F | Resp 18 | Ht 70.0 in | Wt 239.2 lb

## 2022-02-07 DIAGNOSIS — Z0000 Encounter for general adult medical examination without abnormal findings: Secondary | ICD-10-CM

## 2022-02-07 DIAGNOSIS — E785 Hyperlipidemia, unspecified: Secondary | ICD-10-CM

## 2022-02-07 DIAGNOSIS — E1165 Type 2 diabetes mellitus with hyperglycemia: Secondary | ICD-10-CM

## 2022-02-07 DIAGNOSIS — I10 Essential (primary) hypertension: Secondary | ICD-10-CM

## 2022-02-07 DIAGNOSIS — Z1159 Encounter for screening for other viral diseases: Secondary | ICD-10-CM | POA: Diagnosis not present

## 2022-02-07 DIAGNOSIS — M48061 Spinal stenosis, lumbar region without neurogenic claudication: Secondary | ICD-10-CM

## 2022-02-07 DIAGNOSIS — Z951 Presence of aortocoronary bypass graft: Secondary | ICD-10-CM

## 2022-02-07 DIAGNOSIS — E1151 Type 2 diabetes mellitus with diabetic peripheral angiopathy without gangrene: Secondary | ICD-10-CM

## 2022-02-07 NOTE — Assessment & Plan Note (Signed)
Check labs 

## 2022-02-07 NOTE — Assessment & Plan Note (Signed)
stable °

## 2022-02-07 NOTE — Progress Notes (Signed)
Subjective:   By signing my name below, I, Dennis Hopkins, attest that this documentation has been prepared under the direction and in the presence of Dennis Hopkins 02/07/2022   Patient ID: Dennis Hopkins, male    DOB: 04/17/1961, 61 y.o.   MRN: [id number]  Chief Complaint  Patient presents with   Annual Exam    Pt states fasting     HPI Patient is in today for a comprehensive physical exam   He is not regularly checking his blood sugar levels. He is scheduled to receive a dexcom in the future. Lab Results  Component Value Date   HGBA1C 11.0 (H) 11/02/2021   He reports that his blood pressure is improving.  BP Readings from Last 3 Encounters:  02/07/22 120/76  01/04/22 (!) 144/80  11/02/21 (!) 160/100   Pulse Readings from Last 3 Encounters:  02/07/22 77  01/04/22 71  11/02/21 98   He denies having any fever, new muscle pain, joint pain , new moles, congestion, sinus pain, sore throat, chest pain, palpations, cough, SOB ,wheezing,n/v/d constipation, blood in stool, dysuria, frequency, hematuria, at this time  PSA last completed on 08/24/2020 He has not received the Shingrix vaccine. He reports that he has received two Covid-19 vaccines from the same location.  He is scheduled for a vision exam.   Past Medical History:  Diagnosis Date   Chickenpox    Hyperlipemia    Hypertension    Kidney stones 2013    Past Surgical History:  Procedure Laterality Date   CARDIAC CATHETERIZATION N/A 01/07/2016   Procedure: Left Heart Cath and Coronary Angiography;  Surgeon: Jettie Booze, MD;  Location: [place] [facility];  Service: Cardiovascular;  Laterality: N/A;   CORONARY ARTERY BYPASS GRAFT N/A 01/13/2016   Procedure: CORONARY ARTERY BYPASS GRAFTING (CABG) x4 Endoscopic Harvesting of the Right Greater Saphenous Vein;  Surgeon: Melrose Nakayama, MD;  Location: [place];  Service: Open Heart Surgery;  Laterality: N/A;   TEE WITHOUT CARDIOVERSION N/A 01/13/2016    Procedure: TRANSESOPHAGEAL ECHOCARDIOGRAM (TEE);  Surgeon: Melrose Nakayama, MD;  Location: [place];  Service: Open Heart Surgery;  Laterality: N/A;   TONSILLECTOMY      Family History  Problem Relation Age of Onset   Arthritis Mother    Diabetes Mother    Arthritis Father    Hyperlipidemia Father    Diabetes Father    Diabetes Brother     Social History   Socioeconomic History   Marital status: Married    Spouse name: Not on file   Number of children: 1   Years of education: Not on file   Highest education level: Not on file  Occupational History   Not on file  Tobacco Use   Smoking status: Former    Packs/day: 2.00    Years: 35.00    Total pack years: 70.00    Types: Cigarettes    Quit date: 12/21/2013    Years since quitting: 8.1   Smokeless tobacco: Never  Vaping Use   Vaping Use: Never used  Substance and Sexual Activity   Alcohol use: Yes    Alcohol/week: 0.0 standard drinks of alcohol    Comment: 1-2 drinks per night- wife reports he drinks more    Drug use: No   Sexual activity: Yes    Partners: Female  Other Topics Concern   Not on file  Social History Narrative   Not on file   Social Determinants of  Health   Financial Resource Strain: Low Risk  (11/22/2021)   Overall Financial Resource Strain (CARDIA)    Difficulty of Paying Living Expenses: Not very hard  Food Insecurity: Not on file  Transportation Needs: Not on file  Physical Activity: Not on file  Stress: Not on file  Social Connections: Not on file  Intimate Partner Violence: Not on file    Outpatient Medications Prior to Visit  Medication Sig Dispense Refill   augmented betamethasone dipropionate (DIPROLENE AF) 0.05 % cream Apply topically 2 (two) times daily. 30 g 0   ciclopirox (PENLAC) 8 % solution Apply topically at bedtime. Apply over nail and surrounding skin. Apply daily over previous coat. After seven (7) days, may remove with alcohol and continue cycle. 6.6 mL 0   clopidogrel  (PLAVIX) 75 MG tablet Take 1 tablet (75 mg total) by mouth daily. 90 tablet 1   clotrimazole-betamethasone (LOTRISONE) cream APPLY  CREAM TOPICALLY TWICE DAILY 30 g 1   Continuous Blood Gluc Sensor (DEXCOM G7 SENSOR) MISC Use to check blood glucose at least 4 times daily. Change sensor every 10 days. 9 each 1   dapagliflozin propanediol (FARXIGA) 10 MG TABS tablet Take 1 tablet (10 mg total) by mouth daily before breakfast. 30 tablet 2   furosemide (LASIX) 20 MG tablet Take 1 tablet (20 mg total) by mouth daily. 90 tablet 0   gabapentin (NEURONTIN) 100 MG capsule 1-3 tid po (Patient taking differently: Take 100 mg by mouth 3 (three) times daily.) 270 capsule 3   insulin glargine (LANTUS SOLOSTAR) 100 UNIT/ML Solostar Pen Inject 40 Units into the skin daily. 30 mL 6   insulin lispro (HUMALOG KWIKPEN) 100 UNIT/ML KwikPen Inject 8-18 Units into the skin 3 (three) times daily. 15 mL 0   Insulin Pen Needle 32G X 4 MM MISC 1 Device by Does not apply route in the morning, at noon, in the evening, and at bedtime. 400 each 2   ketoconazole (NIZORAL) 2 % cream Apply 1 application. topically daily. 60 g 2   lidocaine (LIDODERM) 5 % Place 1 patch onto the skin daily. Remove & Discard patch within 12 hours or as directed by MD     methocarbamol (ROBAXIN) 500 MG tablet Take 1 tablet (500 mg total) by mouth 4 (four) times daily. 120 tablet 1   metoprolol (TOPROL-XL) 200 MG 24 hr tablet Take 1 tablet (200 mg total) by mouth daily. 90 tablet 2   rosuvastatin (CRESTOR) 20 MG tablet Take 1 tablet (20 mg total) by mouth daily. 30 tablet 2   No facility-administered medications prior to visit.    Allergies  Allergen Reactions   Asa [Aspirin] Anaphylaxis and Swelling    Lips swelling    Review of Systems  Constitutional:  Negative for fever.  HENT:  Negative for congestion, sinus pain and sore throat.   Respiratory:  Negative for cough, shortness of breath and wheezing.   Cardiovascular:  Negative for chest  pain and palpitations.  Gastrointestinal:  Negative for blood in stool, constipation, diarrhea, nausea and vomiting.  Genitourinary:  Negative for dysuria, frequency and hematuria.  Musculoskeletal:  Negative for joint pain and myalgias.  Skin:        (-) New Moles       Objective:    Physical Exam Constitutional:      General: He is not in acute distress.    Appearance: Normal appearance. He is not ill-appearing.  HENT:     Head: Normocephalic and atraumatic.  Right Ear: Tympanic membrane, ear canal and external ear normal.     Left Ear: Tympanic membrane, ear canal and external ear normal.  Eyes:     Extraocular Movements: Extraocular movements intact.     Pupils: Pupils are equal, round, and reactive to light.  Cardiovascular:     Rate and Rhythm: Normal rate and regular rhythm.     Heart sounds: Normal heart sounds. No murmur heard.    No gallop.  Pulmonary:     Effort: Pulmonary effort is normal. No respiratory distress.     Breath sounds: Normal breath sounds. No wheezing or rales.  Abdominal:     General: Bowel sounds are normal. There is no distension.     Palpations: Abdomen is soft.     Tenderness: There is no abdominal tenderness. There is no guarding.  Skin:    General: Skin is warm and dry.  Neurological:     Mental Status: He is alert and oriented to person, place, and time.  Psychiatric:        Judgment: Judgment normal.     BP 120/76 (BP Location: Left Arm, Patient Position: Sitting, Cuff Size: Large)   Pulse 77   Temp 99 F (37.2 C) (Oral)   Resp 18   Ht 5' 10"  (1.778 m)   Wt 239 lb 3.2 oz (108.5 kg)   SpO2 97%   BMI 34.32 kg/m  Wt Readings from Last 3 Encounters:  02/07/22 239 lb 3.2 oz (108.5 kg)  01/04/22 240 lb (108.9 kg)  11/02/21 240 lb (108.9 kg)    Diabetic Foot Exam - Simple   No data filed    Lab Results  Component Value Date   WBC 7.1 02/17/2021   HGB 14.0 02/17/2021   HCT 42.3 02/17/2021   PLT 185.0 02/17/2021    GLUCOSE 233 (H) 11/02/2021   CHOL 227 (H) 11/02/2021   TRIG 177.0 (H) 11/02/2021   HDL 42.10 11/02/2021   LDLDIRECT 180.0 08/24/2020   LDLCALC 150 (H) 11/02/2021   ALT 17 11/02/2021   AST 17 11/02/2021   NA 136 11/02/2021   K 3.9 11/02/2021   CL 96 11/02/2021   CREATININE 1.12 11/02/2021   BUN 16 11/02/2021   CO2 31 11/02/2021   TSH 2.04 02/17/2021   PSA 0.21 08/24/2020   INR 1.32 01/13/2016   HGBA1C 11.0 (H) 11/02/2021   MICROALBUR 10.2 (H) 08/24/2020    Lab Results  Component Value Date   TSH 2.04 02/17/2021   Lab Results  Component Value Date   WBC 7.1 02/17/2021   HGB 14.0 02/17/2021   HCT 42.3 02/17/2021   MCV 86.9 02/17/2021   PLT 185.0 02/17/2021   Lab Results  Component Value Date   NA 136 11/02/2021   K 3.9 11/02/2021   CO2 31 11/02/2021   GLUCOSE 233 (H) 11/02/2021   BUN 16 11/02/2021   CREATININE 1.12 11/02/2021   BILITOT 0.6 11/02/2021   ALKPHOS 73 11/02/2021   AST 17 11/02/2021   ALT 17 11/02/2021   PROT 7.9 11/02/2021   ALBUMIN 4.3 11/02/2021   CALCIUM 9.6 11/02/2021   ANIONGAP 9 01/18/2016   EGFR 76 09/14/2020   GFR 71.34 11/02/2021   Lab Results  Component Value Date   CHOL 227 (H) 11/02/2021   Lab Results  Component Value Date   HDL 42.10 11/02/2021   Lab Results  Component Value Date   LDLCALC 150 (H) 11/02/2021   Lab Results  Component Value Date   TRIG  177.0 (H) 11/02/2021   Lab Results  Component Value Date   CHOLHDL 5 11/02/2021   Lab Results  Component Value Date   HGBA1C 11.0 (H) 11/02/2021       Assessment & Plan:   Problem List Items Addressed This Visit       Unprioritized   Type 2 diabetes mellitus with hyperglycemia, without long-term current use of insulin ([place])   Relevant Orders   CBC with Differential/Platelet   Comprehensive metabolic panel   Hemoglobin A1c   Lipid panel   TSH   HTN (hypertension)   Relevant Orders   CBC with Differential/Platelet   Comprehensive metabolic panel    Hemoglobin A1c   Lipid panel   TSH   Type 2 diabetes mellitus with diabetic peripheral angiopathy without gangrene, without long-term current use of insulin ([facility])    hgba1c to be checked , minimize simple carbs. Increase exercise as tolerated. Continue current meds       S/P CABG x 4    Check labs        Preventative health care - Primary    ghm utd Check labs  See avs       Relevant Orders   CBC with Differential/Platelet   Comprehensive metabolic panel   Hemoglobin A1c   Lipid panel   TSH   PSA   Lumbar foraminal stenosis    stable      Hyperlipidemia LDL goal <70    hgba1c to be checked , minimize simple carbs. Increase exercise as tolerated. Continue current meds      Relevant Orders   CBC with Differential/Platelet   Comprehensive metabolic panel   Hemoglobin A1c   Lipid panel   TSH   Other Visit Diagnoses     Need for hepatitis C screening test       Relevant Orders   Hepatitis C Antibody      No orders of the defined types were placed in this encounter.   IAnn Held, Hopkins, personally preformed the services described in this documentation.  All medical record entries made by the scribe were at my direction and in my presence.  I have reviewed the chart and discharge instructions (if applicable) and agree that the record reflects my personal performance and is accurate and complete. 02/07/2022   I,Amber Collins,acting as a scribe for Dennis Held, Hopkins.,have documented all relevant documentation on the behalf of Dennis Held, Hopkins,as directed by  Dennis Held, Hopkins while in the presence of Dennis Held, Hopkins.    Dennis Held, Hopkins

## 2022-02-07 NOTE — Assessment & Plan Note (Signed)
hgba1c to be checked, minimize simple carbs. Increase exercise as tolerated. Continue current meds  

## 2022-02-07 NOTE — Assessment & Plan Note (Signed)
ghm utd Check labs  See avs  

## 2022-02-07 NOTE — Assessment & Plan Note (Signed)
Well controlled, no changes to meds. Encouraged heart healthy diet such as the DASH diet and exercise as tolerated.  °

## 2022-02-07 NOTE — Patient Instructions (Signed)

## 2022-02-08 LAB — CBC WITH DIFFERENTIAL/PLATELET
Basophils Absolute: 0.1 10*3/uL (ref 0.0–0.1)
Basophils Relative: 0.8 % (ref 0.0–3.0)
Eosinophils Absolute: 0.2 10*3/uL (ref 0.0–0.7)
Eosinophils Relative: 3.1 % (ref 0.0–5.0)
HCT: 42 % (ref 39.0–52.0)
Hemoglobin: 14.1 g/dL (ref 13.0–17.0)
Lymphocytes Relative: 36.6 % (ref 12.0–46.0)
Lymphs Abs: 3 10*3/uL (ref 0.7–4.0)
MCHC: 33.5 g/dL (ref 30.0–36.0)
MCV: 86.4 fl (ref 78.0–100.0)
Monocytes Absolute: 0.6 10*3/uL (ref 0.1–1.0)
Monocytes Relative: 7 % (ref 3.0–12.0)
Neutro Abs: 4.3 10*3/uL (ref 1.4–7.7)
Neutrophils Relative %: 52.5 % (ref 43.0–77.0)
Platelets: 201 10*3/uL (ref 150.0–400.0)
RBC: 4.87 Mil/uL (ref 4.22–5.81)
RDW: 13.3 % (ref 11.5–15.5)
WBC: 8.1 10*3/uL (ref 4.0–10.5)

## 2022-02-08 LAB — COMPREHENSIVE METABOLIC PANEL
ALT: 12 U/L (ref 0–53)
AST: 13 U/L (ref 0–37)
Albumin: 4.3 g/dL (ref 3.5–5.2)
Alkaline Phosphatase: 63 U/L (ref 39–117)
BUN: 19 mg/dL (ref 6–23)
CO2: 28 mEq/L (ref 19–32)
Calcium: 9.6 mg/dL (ref 8.4–10.5)
Chloride: 97 mEq/L (ref 96–112)
Creatinine, Ser: 1.2 mg/dL (ref 0.40–1.50)
GFR: 65.55 mL/min (ref >60.00)
Glucose, Bld: 228 mg/dL — ABNORMAL HIGH (ref 70–99)
Potassium: 4.3 mEq/L (ref 3.5–5.1)
Sodium: 136 mEq/L (ref 135–145)
Total Bilirubin: 0.5 mg/dL (ref 0.2–1.2)
Total Protein: 7.3 g/dL (ref 6.0–8.3)

## 2022-02-08 LAB — LIPID PANEL
Cholesterol: 237 mg/dL — ABNORMAL HIGH (ref 0–200)
HDL: 40.7 mg/dL (ref >39.00)
NonHDL: 196.34
Total CHOL/HDL Ratio: 6
Triglycerides: 233 mg/dL — ABNORMAL HIGH (ref 0.0–149.0)
VLDL: 46.6 mg/dL — ABNORMAL HIGH (ref 0.0–40.0)

## 2022-02-08 LAB — TSH: TSH: 1.39 u[IU]/mL (ref 0.35–5.50)

## 2022-02-08 LAB — PSA: PSA: 0.07 ng/mL — ABNORMAL LOW (ref 0.10–4.00)

## 2022-02-08 LAB — LDL CHOLESTEROL, DIRECT: Direct LDL: 173 mg/dL

## 2022-02-08 LAB — HEMOGLOBIN A1C: Hgb A1c MFr Bld: 11.3 % — ABNORMAL HIGH (ref 4.6–6.5)

## 2022-02-08 LAB — HEPATITIS C ANTIBODY: Hepatitis C Ab: NONREACTIVE

## 2022-02-09 ENCOUNTER — Telehealth: Payer: Self-pay

## 2022-02-09 NOTE — Telephone Encounter (Signed)
[employer] form completed and faxed.

## 2022-02-11 ENCOUNTER — Telehealth: Payer: Self-pay | Admitting: Pharmacist

## 2022-02-11 NOTE — Telephone Encounter (Signed)
Received verification that patient's DexCom sensors were filled and mailed by [facility] patient and he reports he did received sensors. He has not started because his wife is currently in the hospital. He will call in the next week or 2 to set up appointment for Continuous Glucose Monitor education and to start 1st Continuous Glucose Monitor sensor.

## 2022-02-14 ENCOUNTER — Other Ambulatory Visit: Payer: Self-pay | Admitting: Family Medicine

## 2022-02-14 DIAGNOSIS — E785 Hyperlipidemia, unspecified: Secondary | ICD-10-CM

## 2022-02-14 DIAGNOSIS — I10 Essential (primary) hypertension: Secondary | ICD-10-CM

## 2022-02-14 DIAGNOSIS — E1165 Type 2 diabetes mellitus with hyperglycemia: Secondary | ICD-10-CM

## 2022-02-23 ENCOUNTER — Other Ambulatory Visit: Payer: Self-pay

## 2022-02-23 MED ORDER — ROSUVASTATIN CALCIUM 40 MG PO TABS: 40.0000 mg | ORAL_TABLET | Freq: Every day | ORAL | 0 refills | Status: DC

## 2022-03-11 ENCOUNTER — Other Ambulatory Visit: Payer: Self-pay | Admitting: Pharmacist

## 2022-03-11 DIAGNOSIS — G8929 Other chronic pain: Secondary | ICD-10-CM

## 2022-03-11 NOTE — Telephone Encounter (Signed)
Called patient regarding DexCom 7 set up and education. He has sensors but has not started using because he needs education in office.  He states he needs to check his work schedule and will call back.  While on the phone he also requested refill of methocarbamol - has about 5 days left. Forwarding requests to PCP for review and approval. See below for last RF information  METHOCARBAM 500MG    TAB  Sig: 1 Tablet(s) By Mouth 4 Times Daily  Dispensed: 01/23/2022 12:00 AM  Days supply: 30  Quantity: 120 each  Refills remaining: 0  Pharmacy: [facility] [address] [place], [place]  [place] [address]  Phone: [phone]  Fax: [phone]  Authorizing provider: [name], DO  [address]  [place] [address]  Phone: [phone]  Fax: [phone]

## 2022-03-12 MED ORDER — METHOCARBAMOL 500 MG PO TABS: 500.0000 mg | ORAL_TABLET | Freq: Four times a day (QID) | ORAL | 1 refills | Status: DC

## 2022-03-16 ENCOUNTER — Other Ambulatory Visit: Payer: Self-pay | Admitting: Podiatry

## 2022-03-16 DIAGNOSIS — M722 Plantar fascial fibromatosis: Secondary | ICD-10-CM

## 2022-03-17 ENCOUNTER — Telehealth: Payer: Self-pay | Admitting: Family Medicine

## 2022-03-17 ENCOUNTER — Ambulatory Visit: Payer: PRIVATE HEALTH INSURANCE | Admitting: Podiatry

## 2022-03-17 DIAGNOSIS — M792 Neuralgia and neuritis, unspecified: Secondary | ICD-10-CM

## 2022-03-17 NOTE — Telephone Encounter (Signed)
Medication: Muscle relaxer, did not know the name of it   Has the patient contacted their pharmacy? No.   Preferred Pharmacy (with phone number or street name):  [facility] [address] [place], [place] - [address]  [address], [place] [address]  Phone:  [phone]  Fax:  [phone]

## 2022-03-18 MED ORDER — GABAPENTIN 100 MG PO CAPS: ORAL_CAPSULE | ORAL | 3 refills | Status: DC

## 2022-03-18 NOTE — Telephone Encounter (Signed)
Rx sent 

## 2022-04-12 ENCOUNTER — Ambulatory Visit (INDEPENDENT_AMBULATORY_CARE_PROVIDER_SITE_OTHER): Payer: PRIVATE HEALTH INSURANCE | Admitting: Family Medicine

## 2022-04-12 ENCOUNTER — Encounter: Payer: Self-pay | Admitting: Family Medicine

## 2022-04-12 VITALS — BP 136/72 | HR 73 | Temp 98.2°F | Resp 18 | Ht 70.0 in | Wt 243.2 lb

## 2022-04-12 DIAGNOSIS — L308 Other specified dermatitis: Secondary | ICD-10-CM

## 2022-04-12 DIAGNOSIS — L309 Dermatitis, unspecified: Secondary | ICD-10-CM | POA: Insufficient documentation

## 2022-04-12 DIAGNOSIS — E119 Type 2 diabetes mellitus without complications: Secondary | ICD-10-CM

## 2022-04-12 HISTORY — DX: Dermatitis, unspecified: L30.9

## 2022-04-12 MED ORDER — TRIAMCINOLONE ACETONIDE 0.1 % EX CREA: 1.0000 | TOPICAL_CREAM | Freq: Two times a day (BID) | CUTANEOUS | 1 refills | Status: DC

## 2022-04-12 NOTE — Progress Notes (Addendum)
Subjective:   By signing my name below, I, Roma Schanz, attest that this documentation has been prepared under the direction and in the presence of Roma Schanz, 04/12/2022.    Patient ID: Dennis Hopkins, male    DOB: [birth year], 61 y.o.   MRN: [id number]  Chief Complaint  Patient presents with   Rash    X3 months, pt states having rash is off and on and states on back and neck. Pt states it does itch.    HPI Patient is in today for an office visit.  Rash Patient is complaining of an itchy rash on the back of the neck. He reports that he has not taken any medication or treatment for it. He also needs help with his Dexcom G7   Immunizations He is not interested in receiving an influenza vaccine this visit.  Health Maintenance Due  Topic Date Due   OPHTHALMOLOGY EXAM  Never done   Zoster Vaccines- Shingrix (1 of 2) Never done   Lung Cancer Screening  12/17/2016   COVID-19 Vaccine (2 - Booster for Janssen series) 12/21/2019   Diabetic kidney evaluation - Urine ACR  08/24/2021    Past Medical History:  Diagnosis Date   Chickenpox    Hyperlipemia    Hypertension    Kidney stones 2013    Past Surgical History:  Procedure Laterality Date   CARDIAC CATHETERIZATION N/A 01/07/2016   Procedure: Left Heart Cath and Coronary Angiography;  Surgeon: Jettie Booze, MD;  Location: [facility];  Service: Cardiovascular;  Laterality: N/A;   CORONARY ARTERY BYPASS GRAFT N/A 01/13/2016   Procedure: CORONARY ARTERY BYPASS GRAFTING (CABG) x4 Endoscopic Harvesting of the Right Greater Saphenous Vein;  Surgeon: Melrose Nakayama, MD;  Location: [facility];  Service: Open Heart Surgery;  Laterality: N/A;   TEE WITHOUT CARDIOVERSION N/A 01/13/2016   Procedure: TRANSESOPHAGEAL ECHOCARDIOGRAM (TEE);  Surgeon: Melrose Nakayama, MD;  Location: [facility];  Service: Open Heart Surgery;  Laterality: N/A;   TONSILLECTOMY      Family History  Problem Relation Age of Onset    Arthritis Mother    Diabetes Mother    Arthritis Father    Hyperlipidemia Father    Diabetes Father    Diabetes Brother     Social History   Socioeconomic History   Marital status: Married    Spouse name: Not on file   Number of children: 1   Years of education: Not on file   Highest education level: Not on file  Occupational History   Not on file  Tobacco Use   Smoking status: Former    Packs/day: 2.00    Years: 35.00    Total pack years: 70.00    Types: Cigarettes    Quit date: 12/21/2013    Years since quitting: 8.3   Smokeless tobacco: Never  Vaping Use   Vaping Use: Never used  Substance and Sexual Activity   Alcohol use: Yes    Alcohol/week: 0.0 standard drinks of alcohol    Comment: 1-2 drinks per night- wife reports he drinks more    Drug use: No   Sexual activity: Yes    Partners: Female  Other Topics Concern   Not on file  Social History Narrative   Not on file   Social Determinants of Health   Financial Resource Strain: Low Risk  (11/22/2021)   Overall Financial Resource Strain (CARDIA)    Difficulty of Paying Living Expenses: Not very hard  Food Insecurity: Not on file  Transportation Needs: Not on file  Physical Activity: Not on file  Stress: Not on file  Social Connections: Not on file  Intimate Partner Violence: Not on file    Outpatient Medications Prior to Visit  Medication Sig Dispense Refill   augmented betamethasone dipropionate (DIPROLENE AF) 0.05 % cream Apply topically 2 (two) times daily. 30 g 0   ciclopirox (PENLAC) 8 % solution Apply topically at bedtime. Apply over nail and surrounding skin. Apply daily over previous coat. After seven (7) days, may remove with alcohol and continue cycle. 6.6 mL 0   clopidogrel (PLAVIX) 75 MG tablet Take 1 tablet (75 mg total) by mouth daily. 90 tablet 1   clotrimazole-betamethasone (LOTRISONE) cream APPLY  CREAM TOPICALLY TWICE DAILY 30 g 1   Continuous Blood Gluc Sensor (DEXCOM G7 SENSOR) MISC Use to  check blood glucose at least 4 times daily. Change sensor every 10 days. 9 each 1   dapagliflozin propanediol (FARXIGA) 10 MG TABS tablet Take 1 tablet (10 mg total) by mouth daily before breakfast. 30 tablet 2   gabapentin (NEURONTIN) 100 MG capsule 1-3 tid po 270 capsule 3   insulin glargine (LANTUS SOLOSTAR) 100 UNIT/ML Solostar Pen Inject 40 Units into the skin daily. 30 mL 6   insulin lispro (HUMALOG KWIKPEN) 100 UNIT/ML KwikPen Inject 8-18 Units into the skin 3 (three) times daily. 15 mL 0   Insulin Pen Needle 32G X 4 MM MISC 1 Device by Does not apply route in the morning, at noon, in the evening, and at bedtime. 400 each 2   ketoconazole (NIZORAL) 2 % cream Apply 1 application. topically daily. 60 g 2   lidocaine (LIDODERM) 5 % Place 1 patch onto the skin daily. Remove & Discard patch within 12 hours or as directed by MD     methocarbamol (ROBAXIN) 500 MG tablet Take 1 tablet (500 mg total) by mouth 4 (four) times daily. 120 tablet 1   metoprolol (TOPROL-XL) 200 MG 24 hr tablet Take 1 tablet (200 mg total) by mouth daily. 90 tablet 2   rosuvastatin (CRESTOR) 40 MG tablet Take 1 tablet (40 mg total) by mouth daily. 90 tablet 0   furosemide (LASIX) 20 MG tablet Take 1 tablet (20 mg total) by mouth daily. 90 tablet 0   No facility-administered medications prior to visit.    Allergies  Allergen Reactions   Asa [Aspirin] Anaphylaxis and Swelling    Lips swelling    Review of Systems  Constitutional:  Negative for fever and malaise/fatigue.  HENT:  Negative for congestion.   Eyes:  Negative for blurred vision.  Respiratory:  Negative for shortness of breath.   Cardiovascular:  Negative for chest pain, palpitations and leg swelling.  Gastrointestinal:  Negative for abdominal pain, blood in stool and nausea.  Genitourinary:  Negative for dysuria and frequency.  Musculoskeletal:  Negative for falls.  Skin:  Positive for itching and rash.  Neurological:  Negative for dizziness, loss of  consciousness and headaches.  Endo/Heme/Allergies:  Negative for environmental allergies.  Psychiatric/Behavioral:  Negative for depression. The patient is not nervous/anxious.        Objective:    Physical Exam Vitals and nursing note reviewed.  Constitutional:      General: He is not in acute distress.    Appearance: Normal appearance. He is not ill-appearing.  HENT:     Head: Normocephalic and atraumatic.     Right Ear: External ear normal.  Left Ear: External ear normal.  Eyes:     Extraocular Movements: Extraocular movements intact.     Pupils: Pupils are equal, round, and reactive to light.  Cardiovascular:     Rate and Rhythm: Normal rate and regular rhythm.     Heart sounds: Normal heart sounds. No murmur heard.    No gallop.  Pulmonary:     Effort: Pulmonary effort is normal. No respiratory distress.     Breath sounds: Normal breath sounds. No wheezing or rales.  Skin:    General: Skin is warm and dry.     Findings: Erythema and rash present. Rash is macular and scaling.  Neurological:     Mental Status: He is alert and oriented to person, place, and time.  Psychiatric:        Judgment: Judgment normal.     BP 136/72 (BP Location: Left Arm, Patient Position: Sitting, Cuff Size: Large)   Pulse 73   Temp 98.2 F (36.8 C) (Oral)   Resp 18   Ht 5\' 10"  (1.778 m)   Wt 243 lb 3.2 oz (110.3 kg)   SpO2 98%   BMI 34.90 kg/m  Wt Readings from Last 3 Encounters:  04/12/22 243 lb 3.2 oz (110.3 kg)  02/07/22 239 lb 3.2 oz (108.5 kg)  01/04/22 240 lb (108.9 kg)       Assessment & Plan:   Problem List Items Addressed This Visit       Unprioritized   Eczema - Primary    Triamcinolone -- mix 50/50 with eucerin cream appy bid rto prn       Relevant Medications   triamcinolone cream (KENALOG) 0.1 %   Diabetes mellitus type II, non insulin dependent ([place])    Pt shown how to appy dexco G7 and Roderic Ovens helped him the the app and connecting it to his  phone F/u with Tammy      Meds ordered this encounter  Medications   triamcinolone cream (KENALOG) 0.1 %    Sig: Apply 1 Application topically 2 (two) times daily.    Dispense:  30 g    Refill:  1    I, Roma Schanz, personally preformed the services described in this documentation.  All medical record entries made by the scribe were at my direction and in my presence.  I have reviewed the chart and discharge instructions (if applicable) and agree that the record reflects my personal performance and is accurate and complete. 04/12/2022.   I,Verona Buck,acting as a Education administrator for [employer], DO.,have documented all relevant documentation on the behalf of [name], DO,as directed by  [name], DO while in the presence of [name], DO.    [name], DO

## 2022-04-12 NOTE — Assessment & Plan Note (Signed)
Triamcinolone -- mix 50/50 with eucerin cream appy bid rto prn

## 2022-04-12 NOTE — Assessment & Plan Note (Signed)
Pt shown how to [place] and Roderic Ovens helped him the the app and connecting it to his phone F/u with [facility]

## 2022-04-15 ENCOUNTER — Telehealth: Payer: Self-pay | Admitting: Pharmacist

## 2022-04-15 ENCOUNTER — Other Ambulatory Visit: Payer: Self-pay | Admitting: Internal Medicine

## 2022-04-15 NOTE — Telephone Encounter (Signed)
-----   Message from Cherre Robins, [place] sent at 02/11/2022  3:46 PM EDT ----- Regarding: sensors Check on appt for Continuous Glucose Monitor education / start.

## 2022-04-15 NOTE — Telephone Encounter (Signed)
Called patient to check to see how things were going since he started [place] sensors 04/12/2022 and to assist with connecting with our practice on [employer].  Patient states he likes the sensor and has been able to identify when his blood glucose has been high.  Unfortunately patient was in the grocer store so will call him back late to discuss more and to assist with linking with his profile on [facility].

## 2022-04-18 MED ORDER — INSULIN LISPRO (1 UNIT DIAL) 100 UNIT/ML (KWIKPEN): 8.0000 [IU] | PEN_INJECTOR | Freq: Three times a day (TID) | SUBCUTANEOUS | 1 refills | Status: DC

## 2022-04-18 NOTE — Telephone Encounter (Signed)
Patient states blood glucose was improving 112 to 220 until he ran out of Humalog. He tried to get at pharmacy but only Rx on file was for old instructions and only #2 pens.  Sent in updated Rx for Humalog

## 2022-06-02 ENCOUNTER — Telehealth: Payer: Self-pay | Admitting: Pharmacist

## 2022-06-02 NOTE — Telephone Encounter (Signed)
Called patient to check on DexCom use. Patient states that sensor fell off a few days ago and he has not replaced yet. States he needs someone to help with placement. His wife knows how but he has not gotten her assistance yet.  Offered to help show him how to put on and start new sensor on his own - he has stop by office any afternoon except Tuesdays and I would be available.   Patient also endorses that he has both Lantus and Humalog insulin on hand.

## 2022-07-03 ENCOUNTER — Other Ambulatory Visit: Payer: Self-pay | Admitting: Internal Medicine

## 2022-07-04 ENCOUNTER — Telehealth: Payer: Self-pay | Admitting: Family Medicine

## 2022-07-04 MED ORDER — ROSUVASTATIN CALCIUM 40 MG PO TABS: 40.0000 mg | ORAL_TABLET | Freq: Every day | ORAL | 0 refills | Status: DC

## 2022-07-04 MED ORDER — LANTUS SOLOSTAR 100 UNIT/ML ~~LOC~~ SOPN: 40.0000 [IU] | PEN_INJECTOR | Freq: Every day | SUBCUTANEOUS | 0 refills | Status: DC

## 2022-07-04 NOTE — Telephone Encounter (Signed)
Pt states he is having issues getting his insulin from he pharmacy and wondered if Tammy could call him back. Please advise.

## 2022-07-04 NOTE — Telephone Encounter (Signed)
Called patient regarding Lantus. He needs updated Rx sent to [facility].  Asked if he has using DexCom. Would like to connect with patient on DexCom Clarity to that we can see if blood glucose is controlled.  Patient states he was wearing sensor until it fell off 2 days ago. He has not replaced yet.  Encouraged to replace sensor and to download DexCom Clarity app on his phone so that we can review his blood glucose trends and adjust insulin.  Updated Rx sent to pharmacy for Lantus and rosuvastatin.  Follow up planned for 10 days.

## 2022-07-05 ENCOUNTER — Other Ambulatory Visit: Payer: Self-pay | Admitting: Family Medicine

## 2022-07-05 NOTE — Telephone Encounter (Signed)
Pt's insurance does not cover Lantus, okay to change to [facility]?

## 2022-07-06 NOTE — Telephone Encounter (Signed)
Okay to change to [facility] per PCP

## 2022-07-13 ENCOUNTER — Other Ambulatory Visit: Payer: Self-pay | Admitting: Pharmacist

## 2022-07-13 ENCOUNTER — Ambulatory Visit (INDEPENDENT_AMBULATORY_CARE_PROVIDER_SITE_OTHER): Payer: PRIVATE HEALTH INSURANCE | Admitting: Pharmacist

## 2022-07-13 DIAGNOSIS — G8929 Other chronic pain: Secondary | ICD-10-CM

## 2022-07-13 DIAGNOSIS — E119 Type 2 diabetes mellitus without complications: Secondary | ICD-10-CM

## 2022-07-13 NOTE — Progress Notes (Signed)
07/13/2022 Name: Dennis Hopkins MRN: [id number] DOB: 23-Jun-1960  Chief Complaint  Patient presents with   Diabetes    Follow up    Dennis Hopkins is a 62 y.o. year old male who presented for a telephone visit.   They were referred to the pharmacist by their PCP for assistance in managing diabetes and complex medication management.    Subjective:  Medication Access/Adherence  Current Pharmacy:  [facility] [address] [place], [place] - [address] [place] [address] Phone: [phone] Fax: [phone]  [id number] - [place], [place] - [address] AT [place] [address] [place] [address] Phone: [phone] Fax: [phone]   Patient reports affordability concerns with their medications: No  Patient reports access/transportation concerns to their pharmacy: No  Patient reports adherence concerns with their medications:  No      Diabetes:  Current medications:  [place], Humalog  Current glucose readings: 102 to 320 Using Dex Com Continuous Glucose Monitor. Checks blood glucose several times a day. Unable to see report because patient has not downloaded DexCom Clarity app yet.  Practice is not connected to patient.    Patient denies hypoglycemic s/sx including no dizziness, shakiness, sweating. Patient denies hyperglycemic symptoms including no polyuria, polydipsia, polyphagia, nocturia, neuropathy, blurred vision.  Current meal patterns:  Patient states that he has noticed food and beverages that can increase blood glucose since he started using DexCom and has been trying to avoid - like [employer] sweet tea  Current physical activity: has a somewhat physical job  Hyperlipidemia/ASCVD Risk Reduction  Current lipid lowering medications: rosuvastatin - adherence has improved over the last 6 months.    Antiplatelet regimen: clopidogrel  ASCVD History:   CABG  Objective:  Lab Results  Component Value Date   HGBA1C 11.3 (H) 02/07/2022    Lab Results  Component Value Date   CREATININE 1.20 02/07/2022   BUN 19 02/07/2022   NA 136 02/07/2022   K 4.3 02/07/2022   CL 97 02/07/2022   CO2 28 02/07/2022    Lab Results  Component Value Date   CHOL 237 (H) 02/07/2022   HDL 40.70 02/07/2022   LDLCALC 150 (H) 11/02/2021   LDLDIRECT 173.0 02/07/2022   TRIG 233.0 (H) 02/07/2022   CHOLHDL 6 02/07/2022    Medications Reviewed Today     Reviewed by Cherre Robins, RPH-CPP (Pharmacist) on 07/13/22 at 62  Med List Status: <None>   Medication Order Taking? Sig Documenting Provider Last Dose Status Informant  augmented betamethasone dipropionate (DIPROLENE AF) 0.05 % cream [id number]  Apply topically 2 (two) times daily. Carollee Herter, Yvonne R, DO  Active   ciclopirox (PENLAC) 8 % solution [id number]  Apply topically at bedtime. Apply over nail and surrounding skin. Apply daily over previous coat. After seven (7) days, may remove with alcohol and continue cycle. Trula Slade, DPM  Active   clopidogrel (PLAVIX) 75 MG tablet [id number]  Take 1 tablet (75 mg total) by mouth daily. Carollee Herter, Kendrick Fries R, DO  Active   clotrimazole-betamethasone (LOTRISONE) cream [id number]  APPLY  CREAM TOPICALLY TWICE DAILY [name], DO  Active   Continuous Blood Gluc Sensor ([place]) MISC [id number] Yes Use to check blood glucose at least 4 times daily. Change sensor every 10 days. Roma Schanz R, DO Taking Active   dapagliflozin propanediol (FARXIGA) 10 MG TABS tablet [id number] No  Take 1 tablet (10 mg total) by mouth daily before breakfast.  Patient not taking: Reported on 07/04/2022   [name], DO Not Taking Active   furosemide (LASIX) 20 MG tablet [id number] Yes Take 1 tablet (20 mg total) by mouth daily. Roma Schanz R, DO Taking Active   gabapentin (NEURONTIN) 100 MG capsule [id number] Yes 1-3 tid po Carollee Herter,  Yvonne R, DO Taking Active   insulin glargine-yfgn (SEMGLEE) 100 UNIT/ML Pen [id number] Yes Inject 40 Units into the skin daily. Roma Schanz R, DO Taking Active   insulin lispro (HUMALOG KWIKPEN) 100 UNIT/ML KwikPen [id number] Yes Inject 8-18 Units into the skin 3 (three) times daily. Roma Schanz R, DO Taking Active   Insulin Pen Needle 32G X 4 MM MISC [id number] Yes 1 Device by Does not apply route in the morning, at noon, in the evening, and at bedtime. Shamleffer, Melanie Crazier, MD Taking Active   ketoconazole (NIZORAL) 2 % cream [id number]  Apply 1 application. topically daily. Trula Slade, DPM  Active   lidocaine (LIDODERM) 5 % [id number]  Place 1 patch onto the skin daily. Remove & Discard patch within 12 hours or as directed by MD [provider]  Active   methocarbamol (ROBAXIN) 500 MG tablet [id number]  Take 1 tablet (500 mg total) by mouth 4 (four) times daily. Dutch Quint B, FNP  Active   metoprolol (TOPROL-XL) 200 MG 24 hr tablet [id number] Yes Take 1 tablet (200 mg total) by mouth daily. [name], DO Taking Active   rosuvastatin (CRESTOR) 40 MG tablet [id number] Yes Take 1 tablet (40 mg total) by mouth daily. Roma Schanz R, DO Taking Active   triamcinolone cream (KENALOG) 0.1 % [id number]  Apply 1 Application topically 2 (two) times daily. [name], DO  Active               Assessment/Plan:   Diabetes: - Currently uncontrolled - Reviewed long term cardiovascular and renal outcomes of uncontrolled blood sugar - Reviewed goal A1c, goal fasting, and goal 2 hour post prandial glucose. Due to get A1c recheck - patient to call back after work schedule to make appointment - Reminded patient to download Dex Com Clarity app so that practice can link to his Continuous Glucose Monitor reports.  - Reviewed dietary modifications including: Increase non-starchy vegetables - carrots, green bean, squash, zucchini, tomatoes, onions,  peppers, spinach and other green leafy vegetables, cabbage, lettuce, cucumbers, asparagus, okra (not fried), eggplant Limit sugar and processed foods (cakes, cookies, ice cream, crackers and chips) Increase fresh fruit but limit serving sizes 1/2 cup or about the size of tennis or baseball Limit red meat to no more than 1-2 times per week (serving size about the size of your palm) Choose whole grains / lean proteins - whole wheat bread, quinoa, whole grain rice (1/2 cup), fish, chicken, [place] Avoid sugar and calorie containing beverages - soda, sweet tea and juice.  Choose water or unsweetened tea instead. - Recommend to Continue current insulin regimen - Recommend to check glucose at least 4 times a day - reminded to get yearly eye exam.   Hyperlipidemia/ASCVD Risk Reduction: - Currently uncontrolled.  - Reviewed long term complications of uncontrolled cholesterol - Due to recheck lipids - Recommend to continue to take rosuvastatin daily - commended on imporoved adherence    Follow Up Plan: 1 month   Cherre Robins, PharmD Clinical Pharmacist [facility]

## 2022-07-13 NOTE — Telephone Encounter (Signed)
Requesting RF on methocarbamol 500mg   Last Refill:   Dispensed Days Supply Quantity Provider Pharmacy  [facility] 500MG    TAB 05/13/2022 30 120 each Kennyth Arnold, FNP [facility]  METHOCARBAM 500MG    TAB 03/12/2022 30 120 each Kennyth Arnold, FNP [facility]     Requesting refill at [facility]

## 2022-07-14 MED ORDER — METHOCARBAMOL 500 MG PO TABS: 500.0000 mg | ORAL_TABLET | Freq: Four times a day (QID) | ORAL | 1 refills | Status: DC

## 2022-08-24 ENCOUNTER — Other Ambulatory Visit: Payer: Self-pay | Admitting: Family Medicine

## 2022-08-24 ENCOUNTER — Ambulatory Visit (INDEPENDENT_AMBULATORY_CARE_PROVIDER_SITE_OTHER): Payer: PRIVATE HEALTH INSURANCE | Admitting: Family Medicine

## 2022-08-24 ENCOUNTER — Encounter: Payer: Self-pay | Admitting: Family Medicine

## 2022-08-24 VITALS — BP 144/78 | HR 67 | Temp 98.3°F | Ht 71.0 in | Wt 245.1 lb

## 2022-08-24 DIAGNOSIS — R030 Elevated blood-pressure reading, without diagnosis of hypertension: Secondary | ICD-10-CM

## 2022-08-24 DIAGNOSIS — M48061 Spinal stenosis, lumbar region without neurogenic claudication: Secondary | ICD-10-CM | POA: Diagnosis not present

## 2022-08-24 MED ORDER — TIZANIDINE HCL 4 MG PO TABS: 4.0000 mg | ORAL_TABLET | Freq: Three times a day (TID) | ORAL | 0 refills | Status: DC | PRN

## 2022-08-24 MED ORDER — TRAMADOL HCL 50 MG PO TABS: 50.0000 mg | ORAL_TABLET | Freq: Two times a day (BID) | ORAL | 0 refills | Status: DC | PRN

## 2022-08-24 NOTE — Progress Notes (Signed)
Musculoskeletal Exam  Patient: Dennis Hopkins DOB: March 25, 1961  DOS: 08/24/2022  SUBJECTIVE:  Chief Complaint:   Chief Complaint  Patient presents with   Back Pain    Dennis Hopkins is a 62 y.o.  male for evaluation and treatment of back pain.   Onset:  9 months ago.  No inj or change in activity.  Location: lower b/l Character:  tightness Progression of issue:  has worsened Associated symptoms: no bruising, redness, swelling Denies bowel/bladder incontinence or weakness Treatment: to date has been muscle relaxers and gabapentin.  These have not been helpful as of late. Things are affecting his job. Neurovascular symptoms: no He went to Ortho spine and no surgical intervention was offered due to uncontrolled diabetes.    Past Medical History:  Diagnosis Date   Chickenpox    Hyperlipemia    Hypertension    Kidney stones 2013    Objective:  VITAL SIGNS: BP (!) 144/78 (BP Location: Left Arm, Cuff Size: Normal)   Pulse 67   Temp 98.3 F (36.8 C) (Oral)   Ht '5\' 11"'$  (1.803 m)   Wt 245 lb 2 oz (111.2 kg)   SpO2 97%   BMI 34.19 kg/m  Constitutional: Well formed, well developed. No acute distress. HENT: Normocephalic, atraumatic.  Thorax & Lungs:  No accessory muscle use Musculoskeletal: low back.   Tenderness to palpation: no Deformity: no Ecchymosis: no Straight leg test: negative for Poor hamstring flexibility b/l. Neurologic: Normal sensory function. No focal deficits noted. DTR's equal and symmetric in LE's. No clonus.  Gait is mildly antalgic. Psychiatric: Normal mood. Age appropriate judgment and insight. Alert & oriented x 3.    Assessment:  Lumbar foraminal stenosis - Plan: traMADol (ULTRAM) 50 MG tablet, Ambulatory referral to Physical Therapy, tiZANidine (ZANAFLEX) 4 MG tablet  Elevated blood pressure reading  Plan: Chronic, uncontrolled.  Refer to physical therapy, start tramadol which has helped before, heat, ice, Tylenol.  He is on Plavix making  anti-inflammatories less than ideal.  He was scheduled to follow-up with his regular PCP to discuss diabetes management.  He will monitor his blood pressure at home and discuss this at his follow-up with regular PCP as well. F/u with me prn. The patient voiced understanding and agreement to the plan.   [place], DO 08/24/22  4:06 PM

## 2022-08-24 NOTE — Patient Instructions (Signed)
Heat (pad or rice pillow in microwave) over affected area, 10-15 minutes twice daily.   Ice/cold pack over area for 10-15 min twice daily.  OK to take Tylenol 1000 mg (2 extra strength tabs) or 975 mg (3 regular strength tabs) every 6 hours as needed.  Do not drink alcohol, do any illicit/street drugs, drive or do anything that requires alertness while on this medicine.   If you do not hear anything about your referral in the next 1-2 weeks, call our office and ask for an update.  Let us know if you need anything.

## 2022-09-08 ENCOUNTER — Ambulatory Visit (INDEPENDENT_AMBULATORY_CARE_PROVIDER_SITE_OTHER): Payer: PRIVATE HEALTH INSURANCE | Admitting: Family Medicine

## 2022-09-08 ENCOUNTER — Encounter: Payer: Self-pay | Admitting: Family Medicine

## 2022-09-08 VITALS — BP 160/100 | HR 78 | Temp 98.4°F | Resp 18 | Ht 71.0 in | Wt 246.2 lb

## 2022-09-08 DIAGNOSIS — E119 Type 2 diabetes mellitus without complications: Secondary | ICD-10-CM

## 2022-09-08 DIAGNOSIS — M5441 Lumbago with sciatica, right side: Secondary | ICD-10-CM

## 2022-09-08 DIAGNOSIS — M48061 Spinal stenosis, lumbar region without neurogenic claudication: Secondary | ICD-10-CM | POA: Diagnosis not present

## 2022-09-08 DIAGNOSIS — E785 Hyperlipidemia, unspecified: Secondary | ICD-10-CM

## 2022-09-08 DIAGNOSIS — I10 Essential (primary) hypertension: Secondary | ICD-10-CM

## 2022-09-08 DIAGNOSIS — Z79899 Other long term (current) drug therapy: Secondary | ICD-10-CM

## 2022-09-08 MED ORDER — METOPROLOL SUCCINATE ER 200 MG PO TB24: 200.0000 mg | ORAL_TABLET | Freq: Every day | ORAL | 2 refills | Status: DC

## 2022-09-08 MED ORDER — TIZANIDINE HCL 4 MG PO TABS: 4.0000 mg | ORAL_TABLET | Freq: Three times a day (TID) | ORAL | 1 refills | Status: DC | PRN

## 2022-09-08 MED ORDER — DEXCOM G7 RECEIVER DEVI: 0 refills | Status: DC

## 2022-09-08 MED ORDER — TRAMADOL HCL 50 MG PO TABS: 50.0000 mg | ORAL_TABLET | Freq: Four times a day (QID) | ORAL | 0 refills | Status: DC | PRN

## 2022-09-08 NOTE — Patient Instructions (Signed)

## 2022-09-08 NOTE — Assessment & Plan Note (Signed)
Improved with tizanidine and ultram Pt starting next week  Database reviewed  Oral swab for drug screen

## 2022-09-08 NOTE — Assessment & Plan Note (Signed)
Well controlled, no changes to meds. Encouraged heart healthy diet such as the DASH diet and exercise as tolerated.  °

## 2022-09-08 NOTE — Progress Notes (Signed)
Subjective:   By signing my name below, I, Shehryar Baig, attest that this documentation has been prepared under the direction and in the presence of [name], DO. 09/08/2022   Patient ID: Dennis Hopkins, male    DOB: Nov 17, 1960, 62 y.o.   MRN: [id number]  Chief Complaint  Patient presents with   Back Pain    Back Pain Pertinent negatives include no abdominal pain, chest pain, dysuria, fever or headaches.   Patient is in today for a office visit.   He continues having back pain. He was switched to tramadol recently and reports his pain improved. He was also referred to a physical therapist and is going to start soon.  His dexacom device is not working at this time. He is requesting to repair it. He can measure his blood sugar at home manually until it is fixed.    Past Medical History:  Diagnosis Date   Chickenpox    Hyperlipemia    Hypertension    Kidney stones 2013    Past Surgical History:  Procedure Laterality Date   CARDIAC CATHETERIZATION N/A 01/07/2016   Procedure: Left Heart Cath and Coronary Angiography;  Surgeon: Jettie Booze, MD;  Location: [place] [facility];  Service: Cardiovascular;  Laterality: N/A;   CORONARY ARTERY BYPASS GRAFT N/A 01/13/2016   Procedure: CORONARY ARTERY BYPASS GRAFTING (CABG) x4 Endoscopic Harvesting of the Right Greater Saphenous Vein;  Surgeon: Melrose Nakayama, MD;  Location: [place];  Service: Open Heart Surgery;  Laterality: N/A;   TEE WITHOUT CARDIOVERSION N/A 01/13/2016   Procedure: TRANSESOPHAGEAL ECHOCARDIOGRAM (TEE);  Surgeon: Melrose Nakayama, MD;  Location: [place];  Service: Open Heart Surgery;  Laterality: N/A;   TONSILLECTOMY      Family History  Problem Relation Age of Onset   Arthritis Mother    Diabetes Mother    Arthritis Father    Hyperlipidemia Father    Diabetes Father    Diabetes Brother     Social History   Socioeconomic History   Marital status: Married    Spouse name: Not on file    Number of children: 1   Years of education: Not on file   Highest education level: Not on file  Occupational History   Not on file  Tobacco Use   Smoking status: Former    Packs/day: 2.00    Years: 35.00    Additional pack years: 0.00    Total pack years: 70.00    Types: Cigarettes    Quit date: 12/21/2013    Years since quitting: 8.7   Smokeless tobacco: Never  Vaping Use   Vaping Use: Never used  Substance and Sexual Activity   Alcohol use: Yes    Alcohol/week: 0.0 standard drinks of alcohol    Comment: 1-2 drinks per night- wife reports he drinks more    Drug use: No   Sexual activity: Yes    Partners: Female  Other Topics Concern   Not on file  Social History Narrative   Not on file   Social Determinants of Health   Financial Resource Strain: Low Risk  (11/22/2021)   Overall Financial Resource Strain (CARDIA)    Difficulty of Paying Living Expenses: Not very hard  Food Insecurity: Not on file  Transportation Needs: Not on file  Physical Activity: Not on file  Stress: Not on file  Social Connections: Not on file  Intimate Partner Violence: Not on file    Outpatient Medications Prior to Visit  Medication Sig Dispense Refill   augmented betamethasone dipropionate (DIPROLENE AF) 0.05 % cream Apply topically 2 (two) times daily. 30 g 0   ciclopirox (PENLAC) 8 % solution Apply topically at bedtime. Apply over nail and surrounding skin. Apply daily over previous coat. After seven (7) days, may remove with alcohol and continue cycle. 6.6 mL 0   clopidogrel (PLAVIX) 75 MG tablet Take 1 tablet (75 mg total) by mouth daily. 90 tablet 1   clotrimazole-betamethasone (LOTRISONE) cream APPLY  CREAM TOPICALLY TWICE DAILY 30 g 1   Continuous Blood Gluc Sensor (DEXCOM G7 SENSOR) MISC Use to check blood glucose at least 4 times daily. Change sensor every 10 days. 9 each 1   dapagliflozin propanediol (FARXIGA) 10 MG TABS tablet Take 1 tablet (10 mg total) by mouth daily before  breakfast. 30 tablet 2   gabapentin (NEURONTIN) 100 MG capsule 1-3 tid po 270 capsule 3   HUMALOG KWIKPEN 100 UNIT/ML KwikPen INJECT 8 TO 18 UNITS SUBCUTANEOUSLY THREE TIMES DAILY 15 mL 3   insulin glargine-yfgn (SEMGLEE) 100 UNIT/ML Pen Inject 40 Units into the skin daily. 15 mL 5   Insulin Pen Needle 32G X 4 MM MISC 1 Device by Does not apply route in the morning, at noon, in the evening, and at bedtime. 400 each 2   ketoconazole (NIZORAL) 2 % cream Apply 1 application. topically daily. 60 g 2   lidocaine (LIDODERM) 5 % Place 1 patch onto the skin daily. Remove & Discard patch within 12 hours or as directed by MD     rosuvastatin (CRESTOR) 40 MG tablet Take 1 tablet (40 mg total) by mouth daily. 90 tablet 0   triamcinolone cream (KENALOG) 0.1 % Apply 1 Application topically 2 (two) times daily. 30 g 1   metoprolol (TOPROL-XL) 200 MG 24 hr tablet Take 1 tablet (200 mg total) by mouth daily. 90 tablet 2   tiZANidine (ZANAFLEX) 4 MG tablet Take 1 tablet (4 mg total) by mouth every 8 (eight) hours as needed for muscle spasms. 60 tablet 0   traMADol (ULTRAM) 50 MG tablet Take 1 tablet (50 mg total) by mouth every 12 (twelve) hours as needed for moderate pain. 30 tablet 0   furosemide (LASIX) 20 MG tablet Take 1 tablet (20 mg total) by mouth daily. 90 tablet 0   No facility-administered medications prior to visit.    Allergies  Allergen Reactions   Asa [Aspirin] Anaphylaxis and Swelling    Lips swelling    Review of Systems  Constitutional:  Negative for fever and malaise/fatigue.  HENT:  Negative for congestion.   Eyes:  Negative for blurred vision.  Respiratory:  Negative for shortness of breath.   Cardiovascular:  Negative for chest pain, palpitations and leg swelling.  Gastrointestinal:  Negative for abdominal pain, blood in stool and nausea.  Genitourinary:  Negative for dysuria and frequency.  Musculoskeletal:  Positive for back pain. Negative for falls.  Skin:  Negative for rash.   Neurological:  Negative for dizziness, loss of consciousness and headaches.  Endo/Heme/Allergies:  Negative for environmental allergies.  Psychiatric/Behavioral:  Negative for depression. The patient is not nervous/anxious.        Objective:    Physical Exam Vitals and nursing note reviewed.  Constitutional:      General: He is not in acute distress.    Appearance: Normal appearance. He is not ill-appearing.  HENT:     Head: Normocephalic and atraumatic.     Right Ear: External ear normal.  Left Ear: External ear normal.  Eyes:     Extraocular Movements: Extraocular movements intact.     Pupils: Pupils are equal, round, and reactive to light.  Cardiovascular:     Rate and Rhythm: Normal rate and regular rhythm.     Heart sounds: Normal heart sounds. No murmur heard.    No gallop.  Pulmonary:     Effort: Pulmonary effort is normal. No respiratory distress.     Breath sounds: Normal breath sounds. No wheezing or rales.  Musculoskeletal:        General: Tenderness present.  Skin:    General: Skin is warm and dry.  Neurological:     Mental Status: He is alert and oriented to person, place, and time.     Motor: Weakness present.     Gait: Gait abnormal.  Psychiatric:        Mood and Affect: Mood normal.        Judgment: Judgment normal.     BP (!) 160/100 (BP Location: Left Arm, Patient Position: Sitting, Cuff Size: Large)   Pulse 78   Temp 98.4 F (36.9 C) (Oral)   Resp 18   Ht 5\' 11"  (1.803 m)   Wt 246 lb 3.2 oz (111.7 kg)   SpO2 99%   BMI 34.34 kg/m  Wt Readings from Last 3 Encounters:  09/08/22 246 lb 3.2 oz (111.7 kg)  08/24/22 245 lb 2 oz (111.2 kg)  04/12/22 243 lb 3.2 oz (110.3 kg)       Assessment & Plan:  Diabetes mellitus type II, non insulin dependent (HCC) Assessment & Plan: [id number] to be checked , minimize simple carbs. Increase exercise as tolerated. Continue current meds   Orders: -     Comprehensive metabolic panel -     Hemoglobin  A1c -     Dexcom G7 Receiver; As directed  Dispense: 1 each; Refill: 0  Lumbar foraminal stenosis -     traMADol HCl; Take 1 tablet (50 mg total) by mouth every 6 (six) hours as needed for moderate pain.  Dispense: 30 tablet; Refill: 0 -     tiZANidine HCl; Take 1 tablet (4 mg total) by mouth every 8 (eight) hours as needed for muscle spasms.  Dispense: 60 tablet; Refill: 1  Primary hypertension -     CBC with Differential/Platelet -     Comprehensive metabolic panel -     Metoprolol Succinate ER; Take 1 tablet (200 mg total) by mouth daily.  Dispense: 90 tablet; Refill: 2  Hyperlipidemia LDL goal <70 Assessment & Plan: Tolerating statin, encouraged heart healthy diet, avoid trans fats, minimize simple carbs and saturated fats. Increase exercise as tolerated   Orders: -     Lipid panel -     CBC with Differential/Platelet -     Comprehensive metabolic panel  High risk medication use  Acute left-sided low back pain with right-sided sciatica Assessment & Plan: Improved with tizanidine and ultram Pt starting next week  Database reviewed  Oral swab for drug screen     I, [name], DO, personally preformed the services described in this documentation.  All medical record entries made by the scribe were at my direction and in my presence.  I have reviewed the chart and discharge instructions (if applicable) and agree that the record reflects my personal performance and is accurate and complete. 09/08/2022   I,Shehryar Baig,acting as a scribe for [name], DO.,have documented all relevant documentation on the  behalf of [name], DO,as directed by  [name], DO while in the presence of [name], DO.   [name], DO

## 2022-09-08 NOTE — Assessment & Plan Note (Signed)
Tolerating statin, encouraged heart healthy diet, avoid trans fats, minimize simple carbs and saturated fats. Increase exercise as tolerated 

## 2022-09-08 NOTE — Assessment & Plan Note (Signed)
hgba1c to be checked, minimize simple carbs. Increase exercise as tolerated. Continue current meds  

## 2022-09-09 LAB — COMPREHENSIVE METABOLIC PANEL
ALT: 16 U/L (ref 0–53)
AST: 18 U/L (ref 0–37)
Albumin: 4.5 g/dL (ref 3.5–5.2)
Alkaline Phosphatase: 60 U/L (ref 39–117)
BUN: 16 mg/dL (ref 6–23)
CO2: 29 mEq/L (ref 19–32)
Calcium: 9.6 mg/dL (ref 8.4–10.5)
Chloride: 103 mEq/L (ref 96–112)
Creatinine, Ser: 1.07 mg/dL (ref 0.40–1.50)
GFR: 74.91 mL/min (ref >60.00)
Glucose, Bld: 118 mg/dL — ABNORMAL HIGH (ref 70–99)
Potassium: 4 mEq/L (ref 3.5–5.1)
Sodium: 143 mEq/L (ref 135–145)
Total Bilirubin: 0.5 mg/dL (ref 0.2–1.2)
Total Protein: 8.1 g/dL (ref 6.0–8.3)

## 2022-09-09 LAB — CBC WITH DIFFERENTIAL/PLATELET
Basophils Absolute: 0.1 10*3/uL (ref 0.0–0.1)
Basophils Relative: 0.8 % (ref 0.0–3.0)
Eosinophils Absolute: 0.2 10*3/uL (ref 0.0–0.7)
Eosinophils Relative: 2.2 % (ref 0.0–5.0)
HCT: 43 % (ref 39.0–52.0)
Hemoglobin: 14.2 g/dL (ref 13.0–17.0)
Lymphocytes Relative: 26.1 % (ref 12.0–46.0)
Lymphs Abs: 2.2 10*3/uL (ref 0.7–4.0)
MCHC: 33.1 g/dL (ref 30.0–36.0)
MCV: 86.7 fl (ref 78.0–100.0)
Monocytes Absolute: 0.7 10*3/uL (ref 0.1–1.0)
Monocytes Relative: 8.3 % (ref 3.0–12.0)
Neutro Abs: 5.2 10*3/uL (ref 1.4–7.7)
Neutrophils Relative %: 62.6 % (ref 43.0–77.0)
Platelets: 218 10*3/uL (ref 150.0–400.0)
RBC: 4.96 Mil/uL (ref 4.22–5.81)
RDW: 14.1 % (ref 11.5–15.5)
WBC: 8.3 10*3/uL (ref 4.0–10.5)

## 2022-09-09 LAB — LIPID PANEL
Cholesterol: 197 mg/dL (ref 0–200)
HDL: 45.1 mg/dL (ref >39.00)
LDL Cholesterol: 125 mg/dL — ABNORMAL HIGH (ref 0–99)
NonHDL: 152.13
Total CHOL/HDL Ratio: 4
Triglycerides: 134 mg/dL (ref 0.0–149.0)
VLDL: 26.8 mg/dL (ref 0.0–40.0)

## 2022-09-09 LAB — HEMOGLOBIN A1C: Hgb A1c MFr Bld: 8 % — ABNORMAL HIGH (ref 4.6–6.5)

## 2022-09-09 NOTE — Therapy (Signed)
OUTPATIENT PHYSICAL THERAPY THORACOLUMBAR EVALUATION   Patient Name: Dennis Hopkins MRN: [id number] DOB:[birth year], 62 y.o., male Today's Date: 09/13/2022  END OF SESSION:  PT End of Session - 09/13/22 1615     Visit Number 1    Date for PT Re-Evaluation 10/25/22    Authorization Type [employer] - [id number]    Authorization Time Period Benefit period (03/23/22 - 03/20/23)    Authorization - Number of Visits 30    PT Start Time 1615    PT Stop Time 1702    PT Time Calculation (min) 47 min    Activity Tolerance Patient tolerated treatment well    Behavior During Therapy WFL for tasks assessed/performed             Past Medical History:  Diagnosis Date   Chickenpox    Hyperlipemia    Hypertension    Kidney stones 2013   Past Surgical History:  Procedure Laterality Date   CARDIAC CATHETERIZATION N/A 01/07/2016   Procedure: Left Heart Cath and Coronary Angiography;  Surgeon: Jettie Booze, MD;  Location: [place] [facility];  Service: Cardiovascular;  Laterality: N/A;   CORONARY ARTERY BYPASS GRAFT N/A 01/13/2016   Procedure: CORONARY ARTERY BYPASS GRAFTING (CABG) x4 Endoscopic Harvesting of the Right Greater Saphenous Vein;  Surgeon: Melrose Nakayama, MD;  Location: [place];  Service: Open Heart Surgery;  Laterality: N/A;   TEE WITHOUT CARDIOVERSION N/A 01/13/2016   Procedure: TRANSESOPHAGEAL ECHOCARDIOGRAM (TEE);  Surgeon: Melrose Nakayama, MD;  Location: [place];  Service: Open Heart Surgery;  Laterality: N/A;   TONSILLECTOMY     Patient Active Problem List   Diagnosis Date Noted   Eczema 04/12/2022   Lumbar foraminal stenosis 01/05/2022   Type 2 diabetes mellitus with diabetic peripheral angiopathy without gangrene, without long-term current use of insulin ([place]) 09/06/2021   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[place]) 09/06/2021   Peripheral neuropathic pain 09/06/2021   Type 2 diabetes mellitus with hyperglycemia, without long-term current use of insulin ([place])  04/13/2021   Diabetes mellitus ([place]) 04/13/2021   Hypertension    Hyperlipemia    Chickenpox    Urinary frequency 08/24/2020   Snoring 08/24/2020   Rash 08/24/2020   Kidney stones 02/14/2019   Acute left-sided low back pain with right-sided sciatica 02/14/2019   Diabetes mellitus type II, non insulin dependent ([place]) 07/19/2016   Diabetes ([place]) 01/18/2016   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   Coronary artery disease 01/12/2016   Exertional angina    Chest pain 11/11/2015   HTN (hypertension) 10/20/2015   Hyperlipidemia LDL goal <70 10/20/2015   Preventative health care 09/29/2014   Obesity (BMI 30-39.9) 09/26/2013   Tobacco use disorder 09/26/2013    PCP: [name], DO   REFERRING PROVIDER: Shelda Pal, DO  REFERRING DIAG: [phone] (ICD-10-CM) - Lumbar foraminal stenosis   THERAPY DIAG:  Chronic bilateral low back pain with left-sided sciatica  Abnormal posture  Muscle weakness (generalized)  Difficulty in walking, not elsewhere classified  Muscle spasm of back  RATIONALE FOR EVALUATION AND TREATMENT: Rehabilitation  ONSET DATE: ~6 months  NEXT MD VISIT: none scheduled   SUBJECTIVE:  SUBJECTIVE STATEMENT: Pt reports pain in low back and neck going on 6 months with gradual onset. Was told he has stenosis.  PAIN: Are you having pain? Yes: NPRS scale: currently 6/10, up to 9/10 Pain location: B low back Pain description: throbbing Aggravating factors: lifting, walking Relieving factors: heat, Rx meds  PERTINENT HISTORY:  HTN; DM-II; CA s/p CABG x 4 - 2017; Peripheral neuropathic pain; Obesity  PRECAUTIONS: None  WEIGHT BEARING RESTRICTIONS: No  FALLS:  Has patient fallen in last 6 months? No  LIVING ENVIRONMENT: Lives with: lives with their  spouse Lives in: House/apartment Stairs: No Has following equipment at home: None  OCCUPATION: FT - warehouse (shipping and receiving)  PLOF: Independent and Leisure: walking for insurance program discount  PATIENT GOALS: "to be pain free"   OBJECTIVE: (objective measures completed at initial evaluation unless otherwise dated)  DIAGNOSTIC FINDINGS:  11/20/21 - Lumbar spine MRI: FINDINGS: Segmentation:  Standard.   Alignment:  Facet mediated anterolisthesis of L5 on S1 of 3 mm.   Vertebrae: No fracture, suspicious marrow lesion, or significant marrow edema. Hemangioma in the L5 vertebral body. Small L5 superior endplate Schmorl's node. Multilevel Modic type 2 endplate changes.   Conus medullaris and cauda equina: Conus extends to the L1 level. Conus and cauda equina appear normal.   Paraspinal and other soft tissues: Unremarkable.   Disc levels:   Mild diffuse lumbar disc desiccation. Moderate disc space narrowing at L3-4 and L4-5.   T12-L1: Mild disc bulging without stenosis.   L1-2: Disc bulging without stenosis.   L2-3: Disc bulging and mild facet hypertrophy without stenosis.   L3-4: Disc bulging results in borderline to mild bilateral lateral recess stenosis and mild-to-moderate bilateral neural foraminal stenosis without significant spinal stenosis.   L4-5: Disc bulging results in borderline to mild bilateral lateral recess stenosis and moderate bilateral neural foraminal stenosis without significant spinal stenosis.   L5-S1: Anterolisthesis with bulging uncovered disc and moderate right and severe left facet hypertrophy result in moderate bilateral neural foraminal stenosis without spinal stenosis.   IMPRESSION: 1. Multilevel lumbar disc and facet degeneration with moderate neural foraminal stenosis at L4-5 and L5-S1. 2. No significant spinal stenosis.  11/02/21 - Lumbar spine x-ray: FINDINGS: There is no evidence for lumbar spine fracture. Spinal alignment is  normal. There is mild-to-moderate disc space narrowing at L3-L4 and L4-L5, mildly progressed. Mild disc space narrowing at L5-S1 appears stable. Endplate osteophytes are seen throughout. There is facet arthropathy at L5-S1. Soft tissues are within normal limits.   IMPRESSION: 1. No acute fracture or malalignment. 2. Moderate multilevel degenerative changes throughout the spine have mildly progressed compared to 2020.  PATIENT SURVEYS:  Modified Oswestry 21 / 50 = 42.0 %  FOTO lumbar spine 52, predicted 48  SCREENING FOR RED FLAGS: Bowel or bladder incontinence: No Spinal tumors: No Cauda equina syndrome: No Compression fracture: No Abdominal aneurysm: No  COGNITION:  Overall cognitive status: Within functional limits for tasks assessed    SENSATION: Radicular pain down L LE B diabetic peripheral neuropathy in B feet  MUSCLE LENGTH: Hamstrings: mod tight B ITB: mod tight B Piriformis: mod/severe tight R>L Hip flexors: mod tight B Quads: mod tight B Heelcord: NT  POSTURE:  rounded shoulders, forward head, decreased lumbar lordosis, and flexed trunk   PALPATION: Increased muscle tension in bilateral lumbar paraspinals and upper glutes, however patient denies TTP  LUMBAR ROM:   Active  A/PROM  eval  Flexion Hands to ankles *  Extension 50% limited  Right lateral flexion Hand to lateral knee *  Left lateral flexion Hand to lateral calf  Right rotation 25% limited  Left rotation 50% limited  (Blank rows = not tested)  LOWER EXTREMITY ROM:    Limited B hip extension and ER R>L  LOWER EXTREMITY MMT:    MMT Right eval Left eval  Hip flexion 5 4+  Hip extension 4 4  Hip abduction 4 4  Hip adduction 4+ 4+  Hip internal rotation 5 5  Hip external rotation 5 5  Knee flexion 5 5  Knee extension 5 5  Ankle dorsiflexion 5 4+  Ankle plantarflexion 5 5  Ankle inversion    Ankle eversion     (Blank rows = not tested)  LUMBAR SPECIAL TESTS:  Straight leg raise  test: Positive and Slump test: Positive  GAIT: Distance walked: 60 ft Assistive device utilized: None Level of assistance: Complete Independence Gait pattern: step through pattern and trunk flexed   TODAY'S TREATMENT:   09/13/22 THERAPEUTIC EXERCISE: to improve flexibility, strength and mobility.  Verbal and tactile cues throughout for technique.  Supine R HS stretch with strap x 30" Supine R cross-body ITB stretch with strap x 30" Mod Thomas R quad/hip flexor stretch with strap x 30" Hooklying R KTOS piriformis stretch x 30" Hooklying B LTR 2 x 10" Bridge 5 x 5" POE x 1 min Prone press up x 5   PATIENT EDUCATION:  Education details: PT eval findings, anticipated POC, and initial HEP Person educated: Patient Education method: Consulting civil engineer, Demonstration, Verbal cues, and Handouts Education comprehension: verbalized understanding, returned demonstration, verbal cues required, and needs further education  HOME EXERCISE PROGRAM: Access Code: [email or web address] URL: [email or web address] Date: 09/13/2022 Prepared by: Annie Paras  Exercises - Supine Hamstring Stretch with Strap  - 2-3 x daily - 7 x weekly - 3 reps - 30 sec hold - Supine Iliotibial Band Stretch with Strap  - 2-3 x daily - 7 x weekly - 3 reps - 30 sec hold - Supine Quadriceps Stretch with Strap on Table  - 2-3 x daily - 7 x weekly - 3 reps - 30 sec hold - Supine Piriformis Stretch with Foot on Ground  - 2-3 x daily - 7 x weekly - 3 reps - 30 sec hold - Supine Lower Trunk Rotation  - 2-3 x daily - 7 x weekly - 5 reps - 10 sec hold - Supine Bridge  - 1 x daily - 7 x weekly - 2 sets - 10 reps - 3 sec hold - Static Prone on Elbows  - 2-3 x daily - 7 x weekly - 2 sets - 3 reps - 30 sec hold - Prone Press Up  - 2-3 x daily - 7 x weekly - 2 sets - 10 reps - 3 sec hold  Patient Education - Posture and Body Mechanics   ASSESSMENT:  CLINICAL IMPRESSION: Dennis Hopkins is a 62 y.o. male  who was seen today for  physical therapy evaluation and treatment for chronic low back pain secondary to lumbar foraminal stenosis.  He reports a gradual onset of low back pain with right lower extremity radiculopathy originating ~6 months ago.  Pain has progressed to the point where he has limited standing and walking tolerance preventing him from walking for exercise as he used to as part of his wellness program through work.  Current deficits include B low back pain (R>L) with L LE radiculopathy, abnormal posture, decreased lower extremity flexibility, abnormal muscle tension in  lumbar paraspinals and glutes, and decreased core and proximal LE strength.  Extension preference noted during assessment and exercise trial, with patient noting extension in standing and prone helps decrease his pain and radicular symptoms.  Asir will benefit from skilled PT to address above deficits to improve mobility and activity tolerance with decreased pain interference.  He would like to initiate therapy with a 1x/wk frequency due to the high co-pay and his wife's medical needs.  OBJECTIVE IMPAIRMENTS: Abnormal gait, decreased activity tolerance, decreased endurance, decreased knowledge of condition, decreased mobility, difficulty walking, decreased ROM, decreased strength, hypomobility, increased fascial restrictions, impaired perceived functional ability, increased muscle spasms, impaired flexibility, impaired sensation, improper body mechanics, postural dysfunction, and pain.   ACTIVITY LIMITATIONS: carrying, lifting, bending, sitting, standing, squatting, sleeping, stairs, transfers, bathing, dressing, reach over head, locomotion level, and caring for others  PARTICIPATION LIMITATIONS: meal prep, cleaning, laundry, driving, shopping, community activity, occupation, and yard work  PERSONAL FACTORS: Fitness, Past/current experiences, Profession, Time since onset of injury/illness/exacerbation, and 3+ comorbidities: HTN; DM-II; CA s/p CABG x 4  - 2017; Peripheral neuropathic pain; Obesity  are also affecting patient's functional outcome.   REHAB POTENTIAL: Good  CLINICAL DECISION MAKING: Evolving/moderate complexity  EVALUATION COMPLEXITY: Moderate   GOALS: Goals reviewed with patient? Yes  SHORT TERM GOALS: Target date: 10/04/2022   Patient will be independent with initial HEP to improve outcomes and carryover.  Baseline: Initial HEP provided on eval Goal status: INITIAL  2.  Patient will report centralization of radicular symptoms.  Baseline: L LE radicular pain down to calf Goal status: INITIAL  LONG TERM GOALS: Target date: 10/25/2022   Patient will be independent with ongoing/advanced HEP for self-management at home.  Baseline:  Goal status: INITIAL  2.  Patient will report 50-75% improvement in low back pain and L LE radicular pain to improve QOL.  Baseline: 6/10, up to 9/10 with L LE radicular pain Goal status: INITIAL  3.  Patient to demonstrate ability to achieve and maintain good spinal alignment/posturing and body mechanics needed for daily activities. Baseline: Flexed trunk posture with rounded shoulders and forward head Goal status: INITIAL  4.  Patient will demonstrate functional pain free lumbar ROM to perform ADLs.   Baseline: Refer to above lumbar ROM table Goal status: INITIAL  5.  Patient will demonstrate improved B LE strength to >/= 4+/5 for improved stability and ease of mobility . Baseline: Refer to above MMT table Goal status: INITIAL  6.  Patient will report 24 on lumbar FOTO to demonstrate improved functional ability.  Baseline: 52 Goal status: INITIAL   7. Patient will report </= 30% on Modified Oswestry to demonstrate improved functional ability with decreased pain interference. Baseline: 21 / 50 = 42.0 % Goal status: INITIAL  8.  Patient will report ability to resume walking for wellness program at work without limitation due to LBP or L LE radiculopathy. Baseline: Patient has  been unable to walk for wellness program due to pain Goal status: INITIAL   PLAN:  PT FREQUENCY: 1-2x/week  PT DURATION: 6 weeks  PLANNED INTERVENTIONS: Therapeutic exercises, Therapeutic activity, Neuromuscular re-education, Balance training, Gait training, Patient/Family education, Self Care, Joint mobilization, Stair training, Aquatic Therapy, Dry Needling, Electrical stimulation, Spinal manipulation, Spinal mobilization, Cryotherapy, Moist heat, Taping, Traction, Ultrasound, Ionotophoresis 4mg /ml Dexamethasone, Manual therapy, and Re-evaluation  PLAN FOR NEXT SESSION: Review initial HEP; progress lumbopelvic flexibility and strengthening with extension preference; MT +/- DN as indicated   Percival Spanish, PT 09/13/2022, 7:20 PM

## 2022-09-13 ENCOUNTER — Ambulatory Visit: Payer: PRIVATE HEALTH INSURANCE | Attending: Family Medicine | Admitting: Physical Therapy

## 2022-09-13 ENCOUNTER — Other Ambulatory Visit: Payer: Self-pay

## 2022-09-13 ENCOUNTER — Encounter: Payer: Self-pay | Admitting: Physical Therapy

## 2022-09-13 DIAGNOSIS — M5442 Lumbago with sciatica, left side: Secondary | ICD-10-CM | POA: Insufficient documentation

## 2022-09-13 DIAGNOSIS — R293 Abnormal posture: Secondary | ICD-10-CM | POA: Diagnosis present

## 2022-09-13 DIAGNOSIS — G8929 Other chronic pain: Secondary | ICD-10-CM | POA: Insufficient documentation

## 2022-09-13 DIAGNOSIS — M48061 Spinal stenosis, lumbar region without neurogenic claudication: Secondary | ICD-10-CM | POA: Insufficient documentation

## 2022-09-13 DIAGNOSIS — M62830 Muscle spasm of back: Secondary | ICD-10-CM | POA: Insufficient documentation

## 2022-09-13 DIAGNOSIS — R262 Difficulty in walking, not elsewhere classified: Secondary | ICD-10-CM | POA: Diagnosis present

## 2022-09-13 DIAGNOSIS — M6281 Muscle weakness (generalized): Secondary | ICD-10-CM | POA: Diagnosis present

## 2022-09-15 MED ORDER — EZETIMIBE 10 MG PO TABS: 10.0000 mg | ORAL_TABLET | Freq: Every day | ORAL | 3 refills | Status: DC

## 2022-09-15 NOTE — Addendum Note (Signed)
Addended byDamita Dunnings D on: 09/15/2022 03:02 PM   Modules accepted: Orders

## 2022-09-20 ENCOUNTER — Ambulatory Visit: Payer: PRIVATE HEALTH INSURANCE | Attending: Family Medicine

## 2022-09-20 DIAGNOSIS — M5442 Lumbago with sciatica, left side: Secondary | ICD-10-CM | POA: Insufficient documentation

## 2022-09-20 DIAGNOSIS — M62830 Muscle spasm of back: Secondary | ICD-10-CM | POA: Diagnosis present

## 2022-09-20 DIAGNOSIS — R262 Difficulty in walking, not elsewhere classified: Secondary | ICD-10-CM | POA: Insufficient documentation

## 2022-09-20 DIAGNOSIS — M6281 Muscle weakness (generalized): Secondary | ICD-10-CM | POA: Diagnosis present

## 2022-09-20 DIAGNOSIS — R293 Abnormal posture: Secondary | ICD-10-CM | POA: Insufficient documentation

## 2022-09-20 DIAGNOSIS — G8929 Other chronic pain: Secondary | ICD-10-CM | POA: Diagnosis present

## 2022-09-20 NOTE — Therapy (Signed)
OUTPATIENT PHYSICAL THERAPY TREATMENT   Patient Name: Dennis Hopkins MRN: [id number] DOB:05-Jun-1961, 62 y.o., male Today's Date: 09/20/2022  END OF SESSION:  PT End of Session - 09/20/22 1536     Visit Number 2    Date for PT Re-Evaluation 10/25/22    Authorization Type [employer] - [id number]    Authorization Time Period Benefit period (03/23/22 - 03/20/23)    Authorization - Number of Visits 30    PT Start Time 1532    PT Stop Time [id number]    PT Time Calculation (min) 42 min    Activity Tolerance Patient tolerated treatment well    Behavior During Therapy WFL for tasks assessed/performed              Past Medical History:  Diagnosis Date   Chickenpox    Hyperlipemia    Hypertension    Kidney stones 2013   Past Surgical History:  Procedure Laterality Date   CARDIAC CATHETERIZATION N/A 01/07/2016   Procedure: Left Heart Cath and Coronary Angiography;  Surgeon: Jettie Booze, MD;  Location: [facility];  Service: Cardiovascular;  Laterality: N/A;   CORONARY ARTERY BYPASS GRAFT N/A 01/13/2016   Procedure: CORONARY ARTERY BYPASS GRAFTING (CABG) x4 Endoscopic Harvesting of the Right Greater Saphenous Vein;  Surgeon: Melrose Nakayama, MD;  Location: [place];  Service: Open Heart Surgery;  Laterality: N/A;   TEE WITHOUT CARDIOVERSION N/A 01/13/2016   Procedure: TRANSESOPHAGEAL ECHOCARDIOGRAM (TEE);  Surgeon: Melrose Nakayama, MD;  Location: [place];  Service: Open Heart Surgery;  Laterality: N/A;   TONSILLECTOMY     Patient Active Problem List   Diagnosis Date Noted   Eczema 04/12/2022   Lumbar foraminal stenosis 01/05/2022   Type 2 diabetes mellitus with diabetic peripheral angiopathy without gangrene, without long-term current use of insulin 09/06/2021   Calcified granuloma of lung 09/06/2021   Peripheral neuropathic pain 09/06/2021   Type 2 diabetes mellitus with hyperglycemia, without long-term current use of insulin 04/13/2021   Diabetes mellitus  04/13/2021   Hypertension    Hyperlipemia    Chickenpox    Urinary frequency 08/24/2020   Snoring 08/24/2020   Rash 08/24/2020   Kidney stones 02/14/2019   Acute left-sided low back pain with right-sided sciatica 02/14/2019   Diabetes mellitus type II, non insulin dependent 07/19/2016   Diabetes 01/18/2016   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   Coronary artery disease 01/12/2016   Exertional angina    Chest pain 11/11/2015   HTN (hypertension) 10/20/2015   Hyperlipidemia LDL goal <70 10/20/2015   Preventative health care 09/29/2014   Obesity (BMI 30-39.9) 09/26/2013   Tobacco use disorder 09/26/2013    PCP: [name], DO   REFERRING PROVIDER: Shelda Pal, DO  REFERRING DIAG: [phone] (ICD-10-CM) - Lumbar foraminal stenosis   THERAPY DIAG:  Chronic bilateral low back pain with left-sided sciatica  Abnormal posture  Muscle weakness (generalized)  Difficulty in walking, not elsewhere classified  Muscle spasm of back  RATIONALE FOR EVALUATION AND TREATMENT: Rehabilitation  ONSET DATE: ~6 months  NEXT MD VISIT: none scheduled   SUBJECTIVE:  SUBJECTIVE STATEMENT: Pain not too bad today.   PAIN: Are you having pain? Yes: NPRS scale: 4/10 Pain location: B low back Pain description: throbbing Aggravating factors: lifting, walking Relieving factors: heat, Rx meds  PERTINENT HISTORY:  HTN; DM-II; CA s/p CABG x 4 - 2017; Peripheral neuropathic pain; Obesity  PRECAUTIONS: None  WEIGHT BEARING RESTRICTIONS: No  FALLS:  Has patient fallen in last 6 months? No  LIVING ENVIRONMENT: Lives with: lives with their spouse Lives in: House/apartment Stairs: No Has following equipment at home: None  OCCUPATION: FT - warehouse (shipping and receiving)  PLOF:  Independent and Leisure: walking for insurance program discount  PATIENT GOALS: "to be pain free"   OBJECTIVE: (objective measures completed at initial evaluation unless otherwise dated)  DIAGNOSTIC FINDINGS:  11/20/21 - Lumbar spine MRI: FINDINGS: Segmentation:  Standard.   Alignment:  Facet mediated anterolisthesis of L5 on S1 of 3 mm.   Vertebrae: No fracture, suspicious marrow lesion, or significant marrow edema. Hemangioma in the L5 vertebral body. Small L5 superior endplate Schmorl's node. Multilevel Modic type 2 endplate changes.   Conus medullaris and cauda equina: Conus extends to the L1 level. Conus and cauda equina appear normal.   Paraspinal and other soft tissues: Unremarkable.   Disc levels:   Mild diffuse lumbar disc desiccation. Moderate disc space narrowing at L3-4 and L4-5.   T12-L1: Mild disc bulging without stenosis.   L1-2: Disc bulging without stenosis.   L2-3: Disc bulging and mild facet hypertrophy without stenosis.   L3-4: Disc bulging results in borderline to mild bilateral lateral recess stenosis and mild-to-moderate bilateral neural foraminal stenosis without significant spinal stenosis.   L4-5: Disc bulging results in borderline to mild bilateral lateral recess stenosis and moderate bilateral neural foraminal stenosis without significant spinal stenosis.   L5-S1: Anterolisthesis with bulging uncovered disc and moderate right and severe left facet hypertrophy result in moderate bilateral neural foraminal stenosis without spinal stenosis.   IMPRESSION: 1. Multilevel lumbar disc and facet degeneration with moderate neural foraminal stenosis at L4-5 and L5-S1. 2. No significant spinal stenosis.  11/02/21 - Lumbar spine x-ray: FINDINGS: There is no evidence for lumbar spine fracture. Spinal alignment is normal. There is mild-to-moderate disc space narrowing at L3-L4 and L4-L5, mildly progressed. Mild disc space narrowing at L5-S1 appears stable.  Endplate osteophytes are seen throughout. There is facet arthropathy at L5-S1. Soft tissues are within normal limits.   IMPRESSION: 1. No acute fracture or malalignment. 2. Moderate multilevel degenerative changes throughout the spine have mildly progressed compared to 2020.  PATIENT SURVEYS:  Modified Oswestry 21 / 50 = 42.0 %  FOTO lumbar spine 52, predicted 74  SCREENING FOR RED FLAGS: Bowel or bladder incontinence: No Spinal tumors: No Cauda equina syndrome: No Compression fracture: No Abdominal aneurysm: No  COGNITION:  Overall cognitive status: Within functional limits for tasks assessed    SENSATION: Radicular pain down L LE B diabetic peripheral neuropathy in B feet  MUSCLE LENGTH: Hamstrings: mod tight B ITB: mod tight B Piriformis: mod/severe tight R>L Hip flexors: mod tight B Quads: mod tight B Heelcord: NT  POSTURE:  rounded shoulders, forward head, decreased lumbar lordosis, and flexed trunk   PALPATION: Increased muscle tension in bilateral lumbar paraspinals and upper glutes, however patient denies TTP  LUMBAR ROM:   Active  A/PROM  eval  Flexion Hands to ankles *  Extension 50% limited  Right lateral flexion Hand to lateral knee *  Left lateral flexion Hand to lateral calf  Right rotation 25% limited  Left rotation 50% limited  (Blank rows = not tested)  LOWER EXTREMITY ROM:    Limited B hip extension and ER R>L  LOWER EXTREMITY MMT:    MMT Right eval Left eval  Hip flexion 5 4+  Hip extension 4 4  Hip abduction 4 4  Hip adduction 4+ 4+  Hip internal rotation 5 5  Hip external rotation 5 5  Knee flexion 5 5  Knee extension 5 5  Ankle dorsiflexion 5 4+  Ankle plantarflexion 5 5  Ankle inversion    Ankle eversion     (Blank rows = not tested)  LUMBAR SPECIAL TESTS:  Straight leg raise test: Positive and Slump test: Positive  GAIT: Distance walked: 60 ft Assistive device utilized: None Level of assistance: Complete  Independence Gait pattern: step through pattern and trunk flexed   TODAY'S TREATMENT:  09/20/22 THERAPEUTIC EXERCISE: to improve flexibility, strength and mobility.  Verbal and tactile cues throughout for technique.  Nustep L5x73min LTR 3x10" Supine hamstring stretch with strap x 30 sec bil Supine ITB stretch with strap x 30 sec bil Supine quad stretch wit strap x 30 sec bil Su[pine KTOS piriformis stretch x 30 sec bil Prone on elbows x 30 sec Press ups x 10  Knee flexion in prone x 10  Hip extension in prone x 10   Lat pulls 25# x 15 Rows 25# x 15 low grip  09/13/22 THERAPEUTIC EXERCISE: to improve flexibility, strength and mobility.  Verbal and tactile cues throughout for technique.  Supine R HS stretch with strap x 30" Supine R cross-body ITB stretch with strap x 30" Mod Thomas R quad/hip flexor stretch with strap x 30" Hooklying R KTOS piriformis stretch x 30" Hooklying B LTR 2 x 10" Bridge 5 x 5" POE x 1 min Prone press up x 5   PATIENT EDUCATION:  Education details: PT eval findings, anticipated POC, and initial HEP Person educated: Patient Education method: Consulting civil engineer, Demonstration, Verbal cues, and Handouts Education comprehension: verbalized understanding, returned demonstration, verbal cues required, and needs further education  HOME EXERCISE PROGRAM: Access Code: [email or web address] URL: [email or web address] Date: 09/20/2022 Prepared by: Clarene Essex  Exercises - Supine Hamstring Stretch with Strap  - 2-3 x daily - 7 x weekly - 3 reps - 30 sec hold - Supine Iliotibial Band Stretch with Strap  - 2-3 x daily - 7 x weekly - 3 reps - 30 sec hold - Supine Quadriceps Stretch with Strap on Table  - 2-3 x daily - 7 x weekly - 3 reps - 30 sec hold - Supine Piriformis Stretch with Foot on Ground  - 2-3 x daily - 7 x weekly - 3 reps - 30 sec hold - Supine Lower Trunk Rotation  - 2-3 x daily - 7 x weekly - 5 reps - 10 sec hold - Supine Bridge  - 1 x daily - 7 x  weekly - 2 sets - 10 reps - 3 sec hold - Static Prone on Elbows  - 2-3 x daily - 7 x weekly - 2 sets - 3 reps - 30 sec hold - Prone Press Up  - 2-3 x daily - 7 x weekly - 2 sets - 10 reps - 3 sec hold - Prone Hip Extension  - 1 x daily - 7 x weekly - 3 sets - 10 reps - Prone Knee Flexion  - 1 x daily - 7 x weekly - 3 sets - 10 reps  Patient Education -  Posture and Body Mechanics   ASSESSMENT:  CLINICAL IMPRESSION: Progressed exercises and reviewed HEP today. Patient showed good response and return demo of HEP. Added lumbar strengthening with weight machines with no issues. He did require more instruction with the [place] and ITB stretch for form.   OBJECTIVE IMPAIRMENTS: Abnormal gait, decreased activity tolerance, decreased endurance, decreased knowledge of condition, decreased mobility, difficulty walking, decreased ROM, decreased strength, hypomobility, increased fascial restrictions, impaired perceived functional ability, increased muscle spasms, impaired flexibility, impaired sensation, improper body mechanics, postural dysfunction, and pain.   ACTIVITY LIMITATIONS: carrying, lifting, bending, sitting, standing, squatting, sleeping, stairs, transfers, bathing, dressing, reach over head, locomotion level, and caring for others  PARTICIPATION LIMITATIONS: meal prep, cleaning, laundry, driving, shopping, community activity, occupation, and yard work  PERSONAL FACTORS: Fitness, Past/current experiences, Profession, Time since onset of injury/illness/exacerbation, and 3+ comorbidities: HTN; DM-II; CA s/p CABG x 4 - 2017; Peripheral neuropathic pain; Obesity  are also affecting patient's functional outcome.   REHAB POTENTIAL: Good  CLINICAL DECISION MAKING: Evolving/moderate complexity  EVALUATION COMPLEXITY: Moderate   GOALS: Goals reviewed with patient? Yes  SHORT TERM GOALS: Target date: 10/04/2022   Patient will be independent with initial HEP to improve outcomes and carryover.   Baseline: Initial HEP provided on eval Goal status: IN PROGRESS  2.  Patient will report centralization of radicular symptoms.  Baseline: L LE radicular pain down to calf Goal status: IN PROGRESS  LONG TERM GOALS: Target date: 10/25/2022   Patient will be independent with ongoing/advanced HEP for self-management at home.  Baseline:  Goal status: IN PROGRESS  2.  Patient will report 50-75% improvement in low back pain and L LE radicular pain to improve QOL.  Baseline: 6/10, up to 9/10 with L LE radicular pain Goal status: IN PROGRESS  3.  Patient to demonstrate ability to achieve and maintain good spinal alignment/posturing and body mechanics needed for daily activities. Baseline: Flexed trunk posture with rounded shoulders and forward head Goal status: IN PROGRESS  4.  Patient will demonstrate functional pain free lumbar ROM to perform ADLs.   Baseline: Refer to above lumbar ROM table Goal status: IN PROGRESS  5.  Patient will demonstrate improved B LE strength to >/= 4+/5 for improved stability and ease of mobility . Baseline: Refer to above MMT table Goal status: IN PROGRESS  6.  Patient will report 15 on lumbar FOTO to demonstrate improved functional ability.  Baseline: 52 Goal status: IN PROGRESS   7. Patient will report </= 30% on Modified Oswestry to demonstrate improved functional ability with decreased pain interference. Baseline: 21 / 50 = 42.0 % Goal status: IN PROGRESS  8.  Patient will report ability to resume walking for wellness program at work without limitation due to LBP or L LE radiculopathy. Baseline: Patient has been unable to walk for wellness program due to pain Goal status: IN PROGRESS   PLAN:  PT FREQUENCY: 1-2x/week  PT DURATION: 6 weeks  PLANNED INTERVENTIONS: Therapeutic exercises, Therapeutic activity, Neuromuscular re-education, Balance training, Gait training, Patient/Family education, Self Care, Joint mobilization, Stair training, Aquatic  Therapy, Dry Needling, Electrical stimulation, Spinal manipulation, Spinal mobilization, Cryotherapy, Moist heat, Taping, Traction, Ultrasound, Ionotophoresis 4mg /ml Dexamethasone, Manual therapy, and Re-evaluation  PLAN FOR NEXT SESSION: progress lumbopelvic flexibility and strengthening with extension preference; MT +/- DN as indicated   Artist Pais, PTA 09/20/2022, 4:15 PM

## 2022-09-29 ENCOUNTER — Ambulatory Visit: Payer: PRIVATE HEALTH INSURANCE

## 2022-10-03 ENCOUNTER — Encounter: Payer: PRIVATE HEALTH INSURANCE | Admitting: Physical Therapy

## 2022-10-05 ENCOUNTER — Ambulatory Visit: Payer: PRIVATE HEALTH INSURANCE

## 2022-10-05 DIAGNOSIS — G8929 Other chronic pain: Secondary | ICD-10-CM

## 2022-10-05 DIAGNOSIS — R293 Abnormal posture: Secondary | ICD-10-CM

## 2022-10-05 DIAGNOSIS — M5442 Lumbago with sciatica, left side: Secondary | ICD-10-CM | POA: Diagnosis not present

## 2022-10-05 DIAGNOSIS — M62830 Muscle spasm of back: Secondary | ICD-10-CM

## 2022-10-05 DIAGNOSIS — R262 Difficulty in walking, not elsewhere classified: Secondary | ICD-10-CM

## 2022-10-05 DIAGNOSIS — M6281 Muscle weakness (generalized): Secondary | ICD-10-CM

## 2022-10-05 NOTE — Therapy (Addendum)
OUTPATIENT PHYSICAL THERAPY TREATMENT / DISCHARGE SUMMARY   Patient Name: Dennis Hopkins MRN: [id number] DOB:1961/02/27, 62 y.o., male Today's Date: 09/20/2022  END OF SESSION:  PT End of Session - 10/05/22 1755     Visit Number 3    Date for PT Re-Evaluation 10/25/22    Authorization Type [employer] - VL:30    Authorization Time Period Benefit period (03/23/22 - 03/20/23)    Authorization - Number of Visits 30    PT Start Time 1703    PT Stop Time 1749   PT Time Calculation (min) 46 min    Activity Tolerance Patient tolerated treatment well    Behavior During Therapy [facility] for tasks assessed/performed              Past Medical History:  Diagnosis Date   Chickenpox    Hyperlipemia    Hypertension    Kidney stones 2013   Past Surgical History:  Procedure Laterality Date   CARDIAC CATHETERIZATION N/A 01/07/2016   Procedure: Left Heart Cath and Coronary Angiography;  Surgeon: Corky Crafts, MD;  Location: [facility] INVASIVE CV LAB;  Service: Cardiovascular;  Laterality: N/A;   CORONARY ARTERY BYPASS GRAFT N/A 01/13/2016   Procedure: CORONARY ARTERY BYPASS GRAFTING (CABG) x4 Endoscopic Harvesting of the Right Greater Saphenous Vein;  Surgeon: Loreli Slot, MD;  Location: [facility] OR;  Service: Open Heart Surgery;  Laterality: N/A;   TEE WITHOUT CARDIOVERSION N/A 01/13/2016   Procedure: TRANSESOPHAGEAL ECHOCARDIOGRAM (TEE);  Surgeon: Loreli Slot, MD;  Location: [facility] OR;  Service: Open Heart Surgery;  Laterality: N/A;   TONSILLECTOMY     Patient Active Problem List   Diagnosis Date Noted   Eczema 04/12/2022   Lumbar foraminal stenosis 01/05/2022   Type 2 diabetes mellitus with diabetic peripheral angiopathy without gangrene, without long-term current use of insulin 09/06/2021   Calcified granuloma of lung 09/06/2021   Peripheral neuropathic pain 09/06/2021   Type 2 diabetes mellitus with hyperglycemia, without long-term current use of insulin 04/13/2021   Diabetes  mellitus 04/13/2021   Hypertension    Hyperlipemia    Chickenpox    Urinary frequency 08/24/2020   Snoring 08/24/2020   Rash 08/24/2020   Kidney stones 02/14/2019   Acute left-sided low back pain with right-sided sciatica 02/14/2019   Diabetes mellitus type II, non insulin dependent 07/19/2016   Diabetes 01/18/2016   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   Coronary artery disease 01/12/2016   Exertional angina    Chest pain 11/11/2015   HTN (hypertension) 10/20/2015   Hyperlipidemia LDL goal <70 10/20/2015   Preventative health care 09/29/2014   Obesity (BMI 30-39.9) 09/26/2013   Tobacco use disorder 09/26/2013    PCP: Donato Schultz, DO   REFERRING PROVIDER: Sharlene Dory, DO  REFERRING DIAG: [phone] (ICD-10-CM) - Lumbar foraminal stenosis   THERAPY DIAG:  Chronic bilateral low back pain with left-sided sciatica  Abnormal posture  Muscle weakness (generalized)  Difficulty in walking, not elsewhere classified  Muscle spasm of back  RATIONALE FOR EVALUATION AND TREATMENT: Rehabilitation  ONSET DATE: ~6 months  NEXT MD VISIT: none scheduled   SUBJECTIVE:  SUBJECTIVE STATEMENT: "Pain is on and off, today it's about a 5/10.   PAIN: Are you having pain? Yes: NPRS scale: 5/10 Pain location: B low back Pain description: throbbing Aggravating factors: lifting, walking Relieving factors: heat, Rx meds  PERTINENT HISTORY:  HTN; DM-II; CA s/p CABG x 4 - 2017; Peripheral neuropathic pain; Obesity  PRECAUTIONS: None  WEIGHT BEARING RESTRICTIONS: No  FALLS:  Has patient fallen in last 6 months? No  LIVING ENVIRONMENT: Lives with: lives with their spouse Lives in: House/apartment Stairs: No Has following equipment at home: None  OCCUPATION: FT - warehouse  (shipping and receiving)  PLOF: Independent and Leisure: walking for insurance program discount  PATIENT GOALS: "to be pain free"   OBJECTIVE: (objective measures completed at initial evaluation unless otherwise dated)  DIAGNOSTIC FINDINGS:  11/20/21 - Lumbar spine MRI: FINDINGS: Segmentation:  Standard.   Alignment:  Facet mediated anterolisthesis of L5 on S1 of 3 mm.   Vertebrae: No fracture, suspicious marrow lesion, or significant marrow edema. Hemangioma in the L5 vertebral body. Small L5 superior endplate Schmorl's node. Multilevel Modic type 2 endplate changes.   Conus medullaris and cauda equina: Conus extends to the L1 level. Conus and cauda equina appear normal.   Paraspinal and other soft tissues: Unremarkable.   Disc levels:   Mild diffuse lumbar disc desiccation. Moderate disc space narrowing at L3-4 and L4-5.   T12-L1: Mild disc bulging without stenosis.   L1-2: Disc bulging without stenosis.   L2-3: Disc bulging and mild facet hypertrophy without stenosis.   L3-4: Disc bulging results in borderline to mild bilateral lateral recess stenosis and mild-to-moderate bilateral neural foraminal stenosis without significant spinal stenosis.   L4-5: Disc bulging results in borderline to mild bilateral lateral recess stenosis and moderate bilateral neural foraminal stenosis without significant spinal stenosis.   L5-S1: Anterolisthesis with bulging uncovered disc and moderate right and severe left facet hypertrophy result in moderate bilateral neural foraminal stenosis without spinal stenosis.   IMPRESSION: 1. Multilevel lumbar disc and facet degeneration with moderate neural foraminal stenosis at L4-5 and L5-S1. 2. No significant spinal stenosis.  11/02/21 - Lumbar spine x-ray: FINDINGS: There is no evidence for lumbar spine fracture. Spinal alignment is normal. There is mild-to-moderate disc space narrowing at L3-L4 and L4-L5, mildly progressed. Mild disc space  narrowing at L5-S1 appears stable. Endplate osteophytes are seen throughout. There is facet arthropathy at L5-S1. Soft tissues are within normal limits.   IMPRESSION: 1. No acute fracture or malalignment. 2. Moderate multilevel degenerative changes throughout the spine have mildly progressed compared to 2020.  PATIENT SURVEYS:  Modified Oswestry 21 / 50 = 42.0 %  FOTO lumbar spine 52, predicted 63  SCREENING FOR RED FLAGS: Bowel or bladder incontinence: No Spinal tumors: No Cauda equina syndrome: No Compression fracture: No Abdominal aneurysm: No  COGNITION:  Overall cognitive status: Within functional limits for tasks assessed    SENSATION: Radicular pain down L LE B diabetic peripheral neuropathy in B feet  MUSCLE LENGTH: Hamstrings: mod tight B ITB: mod tight B Piriformis: mod/severe tight R>L Hip flexors: mod tight B Quads: mod tight B Heelcord: NT  POSTURE:  rounded shoulders, forward head, decreased lumbar lordosis, and flexed trunk   PALPATION: Increased muscle tension in bilateral lumbar paraspinals and upper glutes, however patient denies TTP  LUMBAR ROM:   Active  A/PROM  eval  Flexion Hands to ankles *  Extension 50% limited  Right lateral flexion Hand to lateral knee *  Left lateral flexion  Hand to lateral calf  Right rotation 25% limited  Left rotation 50% limited  (Blank rows = not tested)  LOWER EXTREMITY ROM:    Limited B hip extension and ER R>L  LOWER EXTREMITY MMT:    MMT Right eval Left eval  Hip flexion 5 4+  Hip extension 4 4  Hip abduction 4 4  Hip adduction 4+ 4+  Hip internal rotation 5 5  Hip external rotation 5 5  Knee flexion 5 5  Knee extension 5 5  Ankle dorsiflexion 5 4+  Ankle plantarflexion 5 5  Ankle inversion    Ankle eversion     (Blank rows = not tested)  LUMBAR SPECIAL TESTS:  Straight leg raise test: Positive and Slump test: Positive  GAIT: Distance walked: 60 ft Assistive device utilized: None Level  of assistance: Complete Independence Gait pattern: step through pattern and trunk flexed   TODAY'S TREATMENT:  10/05/22 THERAPEUTIC EXERCISE: to improve flexibility, strength and mobility.  Verbal and tactile cues throughout for technique.  Nustep L5x15min POE 2x51min each Prone knee flexion  Prone hip extension x 10 bil  Manual Therapy: to decrease muscle spasm, pain and improve mobility.  Supine long leg distraction bil   Estim: IFC 80-150 Hz intensity to tolerance to bil lumbar paraspinals x 12 min  09/20/22 THERAPEUTIC EXERCISE: to improve flexibility, strength and mobility.  Verbal and tactile cues throughout for technique.  Nustep L5x64min LTR 3x10" Supine hamstring stretch with strap x 30 sec bil Supine ITB stretch with strap x 30 sec bil Supine quad stretch with strap x 30 sec bil Su[pine KTOS piriformis stretch x 30 sec bil Prone on elbows x 30 sec Press ups x 10  Knee flexion in prone x 10  Hip extension in prone x 10   Lat pulls 25# x 15 Rows 25# x 15 low grip  09/13/22 THERAPEUTIC EXERCISE: to improve flexibility, strength and mobility.  Verbal and tactile cues throughout for technique.  Supine R HS stretch with strap x 30" Supine R cross-body ITB stretch with strap x 30" Mod Thomas R quad/hip flexor stretch with strap x 30" Hooklying R KTOS piriformis stretch x 30" Hooklying B LTR 2 x 10" Bridge 5 x 5" POE x 1 min Prone press up x 5   PATIENT EDUCATION:  Education details: PT eval findings, anticipated POC, and initial HEP Person educated: Patient Education method: Programmer, multimedia, Demonstration, Verbal cues, and Handouts Education comprehension: verbalized understanding, returned demonstration, verbal cues required, and needs further education  HOME EXERCISE PROGRAM: Access Code: [email or web address] URL: [email or web address] Date: 09/20/2022 Prepared by: Verta Ellen  Exercises - Supine Hamstring Stretch with Strap  - 2-3 x daily - 7 x weekly - 3 reps  - 30 sec hold - Supine Iliotibial Band Stretch with Strap  - 2-3 x daily - 7 x weekly - 3 reps - 30 sec hold - Supine Quadriceps Stretch with Strap on Table  - 2-3 x daily - 7 x weekly - 3 reps - 30 sec hold - Supine Piriformis Stretch with Foot on Ground  - 2-3 x daily - 7 x weekly - 3 reps - 30 sec hold - Supine Lower Trunk Rotation  - 2-3 x daily - 7 x weekly - 5 reps - 10 sec hold - Supine Bridge  - 1 x daily - 7 x weekly - 2 sets - 10 reps - 3 sec hold - Static Prone on Elbows  - 2-3 x daily - 7 x weekly - 2  sets - 3 reps - 30 sec hold - Prone Press Up  - 2-3 x daily - 7 x weekly - 2 sets - 10 reps - 3 sec hold - Prone Hip Extension  - 1 x daily - 7 x weekly - 3 sets - 10 reps - Prone Knee Flexion  - 1 x daily - 7 x weekly - 3 sets - 10 reps  Patient Education - Posture and Body Mechanics   ASSESSMENT:  CLINICAL IMPRESSION: Continued progressing exercises to reduce pain and improve function. He still has radicular pain. He continues to have soreness in his low back. He was able to tolerate the exercises. He had improved pain from estim and moist heat. He would benefit from use of TENS device d/t his continued pain during work and out of work hours. Patient agreed that this would be of benefit as well.   OBJECTIVE IMPAIRMENTS: Abnormal gait, decreased activity tolerance, decreased endurance, decreased knowledge of condition, decreased mobility, difficulty walking, decreased ROM, decreased strength, hypomobility, increased fascial restrictions, impaired perceived functional ability, increased muscle spasms, impaired flexibility, impaired sensation, improper body mechanics, postural dysfunction, and pain.   ACTIVITY LIMITATIONS: carrying, lifting, bending, sitting, standing, squatting, sleeping, stairs, transfers, bathing, dressing, reach over head, locomotion level, and caring for others  PARTICIPATION LIMITATIONS: meal prep, cleaning, laundry, driving, shopping, community activity,  occupation, and yard work  PERSONAL FACTORS: Fitness, Past/current experiences, Profession, Time since onset of injury/illness/exacerbation, and 3+ comorbidities: HTN; DM-II; CA s/p CABG x 4 - 2017; Peripheral neuropathic pain; Obesity  are also affecting patient's functional outcome.   REHAB POTENTIAL: Good  CLINICAL DECISION MAKING: Evolving/moderate complexity  EVALUATION COMPLEXITY: Moderate   GOALS: Goals reviewed with patient? Yes  SHORT TERM GOALS: Target date: 10/04/2022   Patient will be independent with initial HEP to improve outcomes and carryover.  Baseline: Initial HEP provided on eval Goal status: IN PROGRESS  2.  Patient will report centralization of radicular symptoms.  Baseline: L LE radicular pain down to calf Goal status: IN PROGRESS  LONG TERM GOALS: Target date: 10/25/2022   Patient will be independent with ongoing/advanced HEP for self-management at home.  Baseline:  Goal status: IN PROGRESS  2.  Patient will report 50-75% improvement in low back pain and L LE radicular pain to improve QOL.  Baseline: 6/10, up to 9/10 with L LE radicular pain Goal status: IN PROGRESS  3.  Patient to demonstrate ability to achieve and maintain good spinal alignment/posturing and body mechanics needed for daily activities. Baseline: Flexed trunk posture with rounded shoulders and forward head Goal status: IN PROGRESS  4.  Patient will demonstrate functional pain free lumbar ROM to perform ADLs.   Baseline: Refer to above lumbar ROM table Goal status: IN PROGRESS  5.  Patient will demonstrate improved B LE strength to >/= 4+/5 for improved stability and ease of mobility . Baseline: Refer to above MMT table Goal status: IN PROGRESS  6.  Patient will report 29 on lumbar FOTO to demonstrate improved functional ability.  Baseline: 52 Goal status: IN PROGRESS   7. Patient will report </= 30% on Modified Oswestry to demonstrate improved functional ability with decreased  pain interference. Baseline: 21 / 50 = 42.0 % Goal status: IN PROGRESS  8.  Patient will report ability to resume walking for wellness program at work without limitation due to LBP or L LE radiculopathy. Baseline: Patient has been unable to walk for wellness program due to pain Goal status: IN PROGRESS  PLAN:  PT FREQUENCY: 1-2x/week  PT DURATION: 6 weeks  PLANNED INTERVENTIONS: Therapeutic exercises, Therapeutic activity, Neuromuscular re-education, Balance training, Gait training, Patient/Family education, Self Care, Joint mobilization, Stair training, Aquatic Therapy, Dry Needling, Electrical stimulation, Spinal manipulation, Spinal mobilization, Cryotherapy, Moist heat, Taping, Traction, Ultrasound, Ionotophoresis 4mg /ml Dexamethasone, Manual therapy, and Re-evaluation  PLAN FOR NEXT SESSION: progress lumbopelvic flexibility and strengthening with extension preference; MT +/- DN as indicated   Darleene Cleaver, PTA 10/05/2022, 5:08 PM   PHYSICAL THERAPY DISCHARGE SUMMARY  Visits from Start of Care: 3  Current functional level related to goals / functional outcomes: Refer to above clinical impression and goal assessment for status as of last visit on 10/05/22. Patient was not able to return to PT due to his wife being hospitalized, therefore will proceed with discharge from PT for this episode.     Remaining deficits: As above. Unable to formally assess status at discharge due to failure to return to PT.    Education / Equipment: HEP   Patient agrees to discharge. Patient goals were not met. Patient is being discharged due to not returning since the last visit.  Marry Guan, PT 12/27/22, 11:49 AM  [facility] [address]  [address] [place], [place], [address] Phone: [phone]   Fax:  [phone]

## 2022-10-06 ENCOUNTER — Other Ambulatory Visit: Payer: Self-pay | Admitting: Family Medicine

## 2022-10-06 ENCOUNTER — Telehealth: Payer: Self-pay | Admitting: Family Medicine

## 2022-10-06 ENCOUNTER — Encounter: Payer: PRIVATE HEALTH INSURANCE | Admitting: Physical Therapy

## 2022-10-06 DIAGNOSIS — M48061 Spinal stenosis, lumbar region without neurogenic claudication: Secondary | ICD-10-CM

## 2022-10-06 MED ORDER — TRAMADOL HCL 50 MG PO TABS: 50.0000 mg | ORAL_TABLET | Freq: Four times a day (QID) | ORAL | 0 refills | Status: DC | PRN

## 2022-10-06 NOTE — Telephone Encounter (Signed)
Requesting: tramadol  Contract: No UDS: no Last Visit: 09/08/2022 Next Visit: Visit date not found Last Refill: 09/08/22  Please Advise

## 2022-10-06 NOTE — Telephone Encounter (Signed)
Prescription Request  10/06/2022  Is this a "Controlled Substance" medicine? Yes  LOV: 09/08/2022  What is the name of the medication or equipment?  traMADol (ULTRAM) 50 MG tablet [[id number]]   Have you contacted your pharmacy to request a refill? No   Which pharmacy would you like this sent to?  [facility] [id number] - [place] - [address] [place] [address] Phone: [phone] Fax: [phone]    Patient notified that their request is being sent to the clinical staff for review and that they should receive a response within 2 business days.   Please advise at [facility] There is no such number on file (mobile).

## 2022-10-10 ENCOUNTER — Ambulatory Visit: Payer: PRIVATE HEALTH INSURANCE | Admitting: Physical Therapy

## 2022-10-13 ENCOUNTER — Encounter: Payer: PRIVATE HEALTH INSURANCE | Admitting: Physical Therapy

## 2022-10-17 ENCOUNTER — Ambulatory Visit: Payer: PRIVATE HEALTH INSURANCE

## 2022-10-24 ENCOUNTER — Ambulatory Visit: Payer: PRIVATE HEALTH INSURANCE | Admitting: Physical Therapy

## 2022-10-26 ENCOUNTER — Other Ambulatory Visit: Payer: Self-pay | Admitting: Family Medicine

## 2022-10-26 DIAGNOSIS — M48061 Spinal stenosis, lumbar region without neurogenic claudication: Secondary | ICD-10-CM

## 2022-10-26 MED ORDER — TRAMADOL HCL 50 MG PO TABS: 50.0000 mg | ORAL_TABLET | Freq: Four times a day (QID) | ORAL | 0 refills | Status: DC | PRN

## 2022-10-26 NOTE — Telephone Encounter (Signed)
Prescription Request  10/26/2022  Is this a "Controlled Substance" medicine? Yes  LOV: 09/08/2022  What is the name of the medication or equipment?    Have you contacted your pharmacy to request a refill? No   Which pharmacy would you like this sent to?  [facility] [id number] - [place] - [address] [place] [address] Phone: [phone] Fax: [phone]    Patient notified that their request is being sent to the clinical staff for review and that they should receive a response within 2 business days.   Please advise at [facility]   [phone]

## 2022-10-26 NOTE — Addendum Note (Signed)
Addended by: Roxanne Gates on: 10/26/2022 11:13 AM   Modules accepted: Orders

## 2022-10-26 NOTE — Telephone Encounter (Signed)
Requesting: Tramadol Contract: N/A UDS: N/A Last OV: 09/08/2022 Next OV: N/A Last Refill: 10/06/2022, #30--0 RF Database:   Please advise

## 2022-11-16 ENCOUNTER — Other Ambulatory Visit: Payer: Self-pay | Admitting: Family Medicine

## 2022-11-16 DIAGNOSIS — M48061 Spinal stenosis, lumbar region without neurogenic claudication: Secondary | ICD-10-CM

## 2022-11-16 NOTE — Telephone Encounter (Signed)
Prescription Request  11/16/2022  Is this a "Controlled Substance" medicine? Yes  LOV: 09/08/2022  What is the name of the medication or equipment?  traMADol (ULTRAM) 50 MG tablet   Have you contacted your pharmacy to request a refill? No   Which pharmacy would you like this sent to?  [facility] [id number] - [place], [place] - [address] [place] [address] Phone: [phone] Fax: [phone]    Patient notified that their request is being sent to the clinical staff for review and that they should receive a response within 2 business days.   Please advise at

## 2022-11-17 MED ORDER — TRAMADOL HCL 50 MG PO TABS
50.0000 mg | ORAL_TABLET | Freq: Four times a day (QID) | ORAL | 0 refills | Status: DC | PRN
Start: 2022-11-17 — End: 2022-12-06

## 2022-11-17 NOTE — Telephone Encounter (Signed)
Requesting: Tramadol Contract: N/A UDS: N/A  Last OV: 09/08/22 Next OV: N/A Last Refill: 10/26/22, #30--0 RF Database:   Please advise

## 2022-11-17 NOTE — Addendum Note (Signed)
Addended by: Roxanne Gates on: 11/17/2022 08:57 AM   Modules accepted: Orders

## 2022-12-06 ENCOUNTER — Other Ambulatory Visit: Payer: Self-pay | Admitting: Family Medicine

## 2022-12-06 DIAGNOSIS — M48061 Spinal stenosis, lumbar region without neurogenic claudication: Secondary | ICD-10-CM

## 2022-12-06 NOTE — Telephone Encounter (Signed)
Requesting: Tramadol Contract: N/A UDS: N/A Last OV: 09/08/22 Next OV: N/A Last Refill: 11/17/22, #30--0 RF Database:   Please advise

## 2022-12-06 NOTE — Telephone Encounter (Signed)
Medication refill:  Medication: traMADol (ULTRAM) 50 MG tablet   Pharmacy: [facility] [id number] - [place], [place] - [address]   Last visit: 3.21.24  Next visit: n/a

## 2022-12-07 MED ORDER — TRAMADOL HCL 50 MG PO TABS
50.0000 mg | ORAL_TABLET | Freq: Four times a day (QID) | ORAL | 0 refills | Status: DC | PRN
Start: 2022-12-07 — End: 2022-12-23

## 2022-12-23 ENCOUNTER — Telehealth: Payer: Self-pay | Admitting: Family Medicine

## 2022-12-23 ENCOUNTER — Other Ambulatory Visit: Payer: Self-pay | Admitting: Family Medicine

## 2022-12-23 DIAGNOSIS — M48061 Spinal stenosis, lumbar region without neurogenic claudication: Secondary | ICD-10-CM

## 2022-12-23 MED ORDER — TIZANIDINE HCL 4 MG PO TABS
4.0000 mg | ORAL_TABLET | Freq: Three times a day (TID) | ORAL | 1 refills | Status: DC | PRN
Start: 2022-12-23 — End: 2023-02-09

## 2022-12-23 MED ORDER — TRAMADOL HCL 50 MG PO TABS
50.0000 mg | ORAL_TABLET | Freq: Four times a day (QID) | ORAL | 0 refills | Status: DC | PRN
Start: 2022-12-23 — End: 2023-01-09

## 2022-12-23 NOTE — Telephone Encounter (Signed)
Prescription Request  12/23/2022  Is this a "Controlled Substance" medicine? Yes  LOV: 09/08/2022  What is the name of the medication or equipment?   traMADol (ULTRAM) 50 MG tablet [[id number]]   tiZANidine (ZANAFLEX) 4 MG tablet [[id number]]   Have you contacted your pharmacy to request a refill? No   Which pharmacy would you like this sent to?  [facility] [id number] - [place], [place] - [address] [place] [address] Phone: [phone] Fax: [phone]    Patient notified that their request is being sent to the clinical staff for review and that they should receive a response within 2 business days.   Please advise at Mobile There is no such number on file (mobile).

## 2022-12-23 NOTE — Telephone Encounter (Signed)
Requesting: Tramadol Contract:  UDS: Last OV: 09/08/22 Next OV: N/A Last Refill: 12/07/22, #30--0 RF Database:   Please advise

## 2022-12-23 NOTE — Telephone Encounter (Signed)
Pt states he also needs traMADol (ULTRAM) 50 MG tablet.    [facility] [id number] - [place], [place] - [address], [place] [address] Phone: [phone]  Fax: [phone]

## 2022-12-23 NOTE — Addendum Note (Signed)
Addended by: Roxanne Gates on: 12/23/2022 02:10 PM   Modules accepted: Orders

## 2022-12-26 NOTE — Telephone Encounter (Signed)
Rx sent on 12/23/22

## 2023-01-06 ENCOUNTER — Other Ambulatory Visit: Payer: Self-pay | Admitting: Family Medicine

## 2023-01-06 DIAGNOSIS — M48061 Spinal stenosis, lumbar region without neurogenic claudication: Secondary | ICD-10-CM

## 2023-01-06 NOTE — Telephone Encounter (Signed)
Patient called and would like a med refill traMADol (ULTRAM) 50 MG tablet  please send to [facility] on [address]

## 2023-01-09 MED ORDER — TRAMADOL HCL 50 MG PO TABS
50.0000 mg | ORAL_TABLET | Freq: Four times a day (QID) | ORAL | 0 refills | Status: DC | PRN
Start: 2023-01-09 — End: 2023-01-25

## 2023-01-09 NOTE — Addendum Note (Signed)
Addended by: Roxanne Gates on: 01/09/2023 10:16 AM   Modules accepted: Orders

## 2023-01-09 NOTE — Telephone Encounter (Signed)
Requesting: Tramadol Contract: N/A UDS: N/A Last OV: 09/08/22 Next OV: N/A Last Refill: 12/23/22, #30--0 RF Database:   Please advise

## 2023-01-24 ENCOUNTER — Other Ambulatory Visit: Payer: Self-pay | Admitting: Family Medicine

## 2023-01-24 DIAGNOSIS — M48061 Spinal stenosis, lumbar region without neurogenic claudication: Secondary | ICD-10-CM

## 2023-01-24 NOTE — Telephone Encounter (Signed)
Prescription Request  01/24/2023  Is this a "Controlled Substance" medicine? Yes  LOV: 09/08/2022  What is the name of the medication or equipment?   traMADol (ULTRAM) 50 MG tablet [[id number]]  Have you contacted your pharmacy to request a refill? No   Which pharmacy would you like this sent to?  [facility] [id number] - [place], [place] - [address] [place] [address] Phone: [phone] Fax: [phone]    Patient notified that their request is being sent to the clinical staff for review and that they should receive a response within 2 business days.   Please advise at Mobile There is no such number on file (mobile).

## 2023-01-25 MED ORDER — TRAMADOL HCL 50 MG PO TABS
50.0000 mg | ORAL_TABLET | Freq: Four times a day (QID) | ORAL | 0 refills | Status: DC | PRN
Start: 2023-01-25 — End: 2023-02-09

## 2023-01-25 NOTE — Addendum Note (Signed)
Addended by: Roxanne Gates on: 01/25/2023 08:50 AM   Modules accepted: Orders

## 2023-01-25 NOTE — Telephone Encounter (Signed)
Requesting: Tramadol Contract: N/A UDS: N/A Last OV: 09/08/22 Next OV: 02/09/23 Last Refill: 01/09/2023, #30--0 RF Database:   Please advise

## 2023-02-09 ENCOUNTER — Encounter: Payer: PRIVATE HEALTH INSURANCE | Admitting: Family Medicine

## 2023-02-09 ENCOUNTER — Other Ambulatory Visit: Payer: Self-pay | Admitting: Family Medicine

## 2023-02-09 DIAGNOSIS — M48061 Spinal stenosis, lumbar region without neurogenic claudication: Secondary | ICD-10-CM

## 2023-02-09 MED ORDER — TIZANIDINE HCL 4 MG PO TABS
4.0000 mg | ORAL_TABLET | Freq: Three times a day (TID) | ORAL | 1 refills | Status: DC | PRN
Start: 2023-02-09 — End: 2023-04-20

## 2023-02-09 MED ORDER — TRAMADOL HCL 50 MG PO TABS
50.0000 mg | ORAL_TABLET | Freq: Four times a day (QID) | ORAL | 0 refills | Status: DC | PRN
Start: 2023-02-09 — End: 2023-02-17

## 2023-02-09 NOTE — Telephone Encounter (Signed)
Prescription Request  02/09/2023  Is this a "Controlled Substance" medicine? No  LOV: 09/08/2022  What is the name of the medication or equipment? tiZANidine (ZANAFLEX) 4 MG tablet & traMADol (ULTRAM) 50 MG tablet  Have you contacted your pharmacy to request a refill? No   Which pharmacy would you like this sent to?  [facility] [id number] - [place], [place] - [address] [place] [address] Phone: [phone] Fax: [phone]    Patient notified that their request is being sent to the clinical staff for review and that they should receive a response within 2 business days.   Please advise at [facility] [phone]

## 2023-02-09 NOTE — Addendum Note (Signed)
Addended by: Roxanne Gates on: 02/09/2023 11:17 AM   Modules accepted: Orders

## 2023-02-09 NOTE — Telephone Encounter (Signed)
Requesting: Tramadol Contract: N/A UDS: N/A Last OV: 09/08/22 Next OV: 02/17/23 Last Refill: 01/25/23, #30--0 RF Database:   Please advise

## 2023-02-17 ENCOUNTER — Ambulatory Visit (INDEPENDENT_AMBULATORY_CARE_PROVIDER_SITE_OTHER): Payer: PRIVATE HEALTH INSURANCE | Admitting: Family Medicine

## 2023-02-17 ENCOUNTER — Encounter: Payer: Self-pay | Admitting: Family Medicine

## 2023-02-17 VITALS — BP 132/86 | HR 60 | Temp 99.0°F | Resp 18 | Ht 71.0 in | Wt 234.2 lb

## 2023-02-17 DIAGNOSIS — E785 Hyperlipidemia, unspecified: Secondary | ICD-10-CM | POA: Diagnosis not present

## 2023-02-17 DIAGNOSIS — I10 Essential (primary) hypertension: Secondary | ICD-10-CM | POA: Diagnosis not present

## 2023-02-17 DIAGNOSIS — E119 Type 2 diabetes mellitus without complications: Secondary | ICD-10-CM

## 2023-02-17 DIAGNOSIS — Z0000 Encounter for general adult medical examination without abnormal findings: Secondary | ICD-10-CM | POA: Diagnosis not present

## 2023-02-17 DIAGNOSIS — Z794 Long term (current) use of insulin: Secondary | ICD-10-CM

## 2023-02-17 DIAGNOSIS — Z23 Encounter for immunization: Secondary | ICD-10-CM

## 2023-02-17 DIAGNOSIS — M48061 Spinal stenosis, lumbar region without neurogenic claudication: Secondary | ICD-10-CM | POA: Diagnosis not present

## 2023-02-17 DIAGNOSIS — Z720 Tobacco use: Secondary | ICD-10-CM

## 2023-02-17 DIAGNOSIS — Z951 Presence of aortocoronary bypass graft: Secondary | ICD-10-CM

## 2023-02-17 DIAGNOSIS — B359 Dermatophytosis, unspecified: Secondary | ICD-10-CM

## 2023-02-17 MED ORDER — CLOTRIMAZOLE-BETAMETHASONE 1-0.05 % EX CREA
1.0000 | TOPICAL_CREAM | Freq: Every day | CUTANEOUS | 0 refills | Status: DC
Start: 2023-02-17 — End: 2023-04-15

## 2023-02-17 MED ORDER — TRAMADOL HCL 50 MG PO TABS
50.0000 mg | ORAL_TABLET | Freq: Four times a day (QID) | ORAL | 0 refills | Status: DC | PRN
Start: 2023-02-17 — End: 2023-04-17

## 2023-02-17 NOTE — Progress Notes (Signed)
Established Patient Office Visit  Subjective   Patient ID: Dennis Hopkins, male    DOB: 10-Mar-1961  Age: 62 y.o. MRN: [id number]  Chief Complaint  Patient presents with   Annual Exam    Pt states fasting     HPI Discussed the use of AI scribe software for clinical note transcription with the patient, who gave verbal consent to proceed.  History of Present Illness   The patient, with a history of heart surgery and diabetes, presents with a rash, bowel issues, and tremors. He has been dealing with the recent loss of his wife and other family members, which has been emotionally taxing.  The patient reports that his tremors have been managed effectively with his current medication, but he requests a longer prescription duration to avoid frequent pharmacy visits. He also mentions that he has been taking tizanidine before bed to help with relaxation and sleep.  The patient has been experiencing a recurring rash, for which he has previously received treatment. He requests a new prescription for this issue.  He also reports bowel issues, which he attributes to his use of tramadol. He describes infrequent bowel movements, occurring about once a week, with multiple instances in a single day.  The patient also mentions some numbness in his feet, which he has been managing with medication. He has been taking three pills a day, which seems to be helping.      Patient Active Problem List   Diagnosis Date Noted   Eczema 04/12/2022   Lumbar foraminal stenosis 01/05/2022   Type 2 diabetes mellitus with diabetic peripheral angiopathy without gangrene, without long-term current use of insulin (HCC) 09/06/2021   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HCC) 09/06/2021   Peripheral neuropathic pain 09/06/2021   Type 2 diabetes mellitus with hyperglycemia, without long-term current use of insulin (HCC) 04/13/2021   Diabetes mellitus (HCC) 04/13/2021   Hypertension    Hyperlipemia    Chickenpox    Urinary  frequency 08/24/2020   Snoring 08/24/2020   Rash 08/24/2020   Kidney stones 02/14/2019   Acute left-sided low back pain with right-sided sciatica 02/14/2019   Diabetes mellitus type II, non insulin dependent (HCC) 07/19/2016   Diabetes (HCC) 01/18/2016   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   Coronary artery disease 01/12/2016   Exertional angina    Chest pain 11/11/2015   HTN (hypertension) 10/20/2015   Hyperlipidemia LDL goal <70 10/20/2015   Preventative health care 09/29/2014   Obesity (BMI 30-39.9) 09/26/2013   Tobacco use disorder 09/26/2013   Past Medical History:  Diagnosis Date   Chickenpox    Hyperlipemia    Hypertension    Kidney stones 2013   Past Surgical History:  Procedure Laterality Date   CARDIAC CATHETERIZATION N/A 01/07/2016   Procedure: Left Heart Cath and Coronary Angiography;  Surgeon: Corky Crafts, MD;  Location: [facility] INVASIVE CV LAB;  Service: Cardiovascular;  Laterality: N/A;   CORONARY ARTERY BYPASS GRAFT N/A 01/13/2016   Procedure: CORONARY ARTERY BYPASS GRAFTING (CABG) x4 Endoscopic Harvesting of the Right Greater Saphenous Vein;  Surgeon: Loreli Slot, MD;  Location: [facility] OR;  Service: Open Heart Surgery;  Laterality: N/A;   TEE WITHOUT CARDIOVERSION N/A 01/13/2016   Procedure: TRANSESOPHAGEAL ECHOCARDIOGRAM (TEE);  Surgeon: Loreli Slot, MD;  Location: [facility] OR;  Service: Open Heart Surgery;  Laterality: N/A;   TONSILLECTOMY     Social History   Tobacco Use   Smoking status: Former    Current packs/day: 0.00  Average packs/day: 2.0 packs/day for 35.0 years (70.0 ttl pk-yrs)    Types: Cigarettes    Start date: 12/22/1978    Quit date: 12/21/2013    Years since quitting: 9.1   Smokeless tobacco: Never  Vaping Use   Vaping status: Never Used  Substance Use Topics   Alcohol use: Yes    Alcohol/week: 0.0 standard drinks of alcohol    Comment: 1-2 drinks per night- wife reports he drinks more    Drug use: No   Social History   Socioeconomic  History   Marital status: Widowed    Spouse name: Not on file   Number of children: 1   Years of education: Not on file   Highest education level: Not on file  Occupational History   Not on file  Tobacco Use   Smoking status: Former    Current packs/day: 0.00    Average packs/day: 2.0 packs/day for 35.0 years (70.0 ttl pk-yrs)    Types: Cigarettes    Start date: 12/22/1978    Quit date: 12/21/2013    Years since quitting: 9.1   Smokeless tobacco: Never  Vaping Use   Vaping status: Never Used  Substance and Sexual Activity   Alcohol use: Yes    Alcohol/week: 0.0 standard drinks of alcohol    Comment: 1-2 drinks per night- wife reports he drinks more    Drug use: No   Sexual activity: Yes    Partners: Female  Other Topics Concern   Not on file  Social History Narrative   Not on file   Social Determinants of Health   Financial Resource Strain: Low Risk  (11/22/2021)   Overall Financial Resource Strain (CARDIA)    Difficulty of Paying Living Expenses: Not very hard  Food Insecurity: Not on file  Transportation Needs: Not on file  Physical Activity: Not on file  Stress: Not on file  Social Connections: Not on file  Intimate Partner Violence: Not on file   Family Status  Relation Name Status   Mother  Deceased   Father  Deceased   Brother  Alive   Sister  Alive   Sister  Alive  No partnership data on file   Family History  Problem Relation Age of Onset   Arthritis Mother    Diabetes Mother    Arthritis Father    Hyperlipidemia Father    Diabetes Father    Diabetes Brother    Allergies  Allergen Reactions   Asa [Aspirin] Anaphylaxis and Swelling    Lips swelling      Review of Systems  Constitutional:  Negative for chills, fever and malaise/fatigue.  HENT:  Negative for congestion and hearing loss.   Eyes:  Negative for blurred vision and discharge.  Respiratory:  Negative for cough, sputum production and shortness of breath.   Cardiovascular:  Negative for  chest pain, palpitations and leg swelling.  Gastrointestinal:  Negative for abdominal pain, blood in stool, constipation, diarrhea, heartburn, nausea and vomiting.  Genitourinary:  Negative for dysuria, frequency, hematuria and urgency.  Musculoskeletal:  Negative for back pain, falls and myalgias.  Skin:  Negative for rash.  Neurological:  Negative for dizziness, sensory change, loss of consciousness, weakness and headaches.  Endo/Heme/Allergies:  Negative for environmental allergies. Does not bruise/bleed easily.  Psychiatric/Behavioral:  Negative for depression and suicidal ideas. The patient is not nervous/anxious and does not have insomnia.       Objective:     BP 132/86 (BP Location: Left Arm, Patient Position:  Sitting, Cuff Size: Large)   Pulse 60   Temp 99 F (37.2 C) (Oral)   Resp 18   Ht 5\' 11"  (1.803 m)   Wt 234 lb 3.2 oz (106.2 kg)   SpO2 99%   BMI 32.66 kg/m  BP Readings from Last 3 Encounters:  02/17/23 132/86  09/08/22 (!) 160/100  08/24/22 (!) 144/78   Wt Readings from Last 3 Encounters:  02/17/23 234 lb 3.2 oz (106.2 kg)  09/08/22 246 lb 3.2 oz (111.7 kg)  08/24/22 245 lb 2 oz (111.2 kg)   SpO2 Readings from Last 3 Encounters:  02/17/23 99%  09/08/22 99%  08/24/22 97%      Physical Exam Vitals and nursing note reviewed.  Constitutional:      General: He is not in acute distress.    Appearance: Normal appearance. He is well-developed.  HENT:     Head: Normocephalic and atraumatic.     Right Ear: Tympanic membrane, ear canal and external ear normal. There is no impacted cerumen.     Left Ear: Tympanic membrane, ear canal and external ear normal. There is no impacted cerumen.     Nose: Nose normal.     Mouth/Throat:     Mouth: Mucous membranes are moist.     Pharynx: Oropharynx is clear. No oropharyngeal exudate or posterior oropharyngeal erythema.  Eyes:     General: No scleral icterus.       Right eye: No discharge.        Left eye: No  discharge.     Conjunctiva/sclera: Conjunctivae normal.     Pupils: Pupils are equal, round, and reactive to light.  Neck:     Thyroid: No thyromegaly.     Vascular: No JVD.  Cardiovascular:     Rate and Rhythm: Normal rate and regular rhythm.     Heart sounds: Normal heart sounds. No murmur heard. Pulmonary:     Effort: Pulmonary effort is normal. No respiratory distress.     Breath sounds: Normal breath sounds.  Abdominal:     General: Bowel sounds are normal. There is no distension.     Palpations: Abdomen is soft. There is no mass.     Tenderness: There is no abdominal tenderness. There is no guarding or rebound.  Musculoskeletal:        General: Normal range of motion.     Cervical back: Normal range of motion and neck supple.     Right lower leg: No edema.     Left lower leg: No edema.  Lymphadenopathy:     Cervical: No cervical adenopathy.  Skin:    General: Skin is warm and dry.     Findings: No erythema or rash.  Neurological:     Mental Status: He is alert and oriented to person, place, and time.     Cranial Nerves: No cranial nerve deficit.     Motor: No abnormal muscle tone.     Deep Tendon Reflexes: Reflexes are normal and symmetric. Reflexes normal.  Psychiatric:        Mood and Affect: Mood normal.        Behavior: Behavior normal.        Thought Content: Thought content normal.        Judgment: Judgment normal.      No results found for any visits on 02/17/23.  Last CBC Lab Results  Component Value Date   WBC 8.3 09/08/2022   HGB 14.2 09/08/2022   HCT 43.0 09/08/2022  MCV 86.7 09/08/2022   MCH 28.2 01/16/2016   RDW 14.1 09/08/2022   PLT 218.0 09/08/2022   Last metabolic panel Lab Results  Component Value Date   GLUCOSE 118 (H) 09/08/2022   NA 143 09/08/2022   K 4.0 09/08/2022   CL 103 09/08/2022   CO2 29 09/08/2022   BUN 16 09/08/2022   CREATININE 1.07 09/08/2022   GFR 74.91 09/08/2022   CALCIUM 9.6 09/08/2022   PROT 8.1 09/08/2022    ALBUMIN 4.5 09/08/2022   BILITOT 0.5 09/08/2022   ALKPHOS 60 09/08/2022   AST 18 09/08/2022   ALT 16 09/08/2022   ANIONGAP 9 01/18/2016   Last lipids Lab Results  Component Value Date   CHOL 197 09/08/2022   HDL 45.10 09/08/2022   LDLCALC 125 (H) 09/08/2022   LDLDIRECT 173.0 02/07/2022   TRIG 134.0 09/08/2022   CHOLHDL 4 09/08/2022   Last hemoglobin A1c Lab Results  Component Value Date   HGBA1C 8.0 (H) 09/08/2022   Last thyroid functions Lab Results  Component Value Date   TSH 1.39 02/07/2022   Last vitamin D No results found for: "25OHVITD2", "25OHVITD3", "VD25OH" Last vitamin B12 and Folate Lab Results  Component Value Date   VITAMINB12 322 02/17/2021   FOLATE 11.6 02/17/2021      The 10-year ASCVD risk score (Arnett DK, et al., 2019) is: 22.1%    Assessment & Plan:   Problem List Items Addressed This Visit       Unprioritized   HTN (hypertension)   Relevant Orders   CBC with Differential/Platelet   S/P CABG x 4   Relevant Orders   CBC with Differential/Platelet   Comprehensive metabolic panel   Hemoglobin A1c   Lipid panel   Microalbumin / creatinine urine ratio   Preventative health care   Relevant Orders   CBC with Differential/Platelet   Comprehensive metabolic panel   Hemoglobin A1c   Lipid panel   Microalbumin / creatinine urine ratio   PSA   Lumbar foraminal stenosis   Relevant Medications   traMADol (ULTRAM) 50 MG tablet   Other Relevant Orders   Ambulatory referral to Physical Therapy   Hyperlipidemia LDL goal <70   Relevant Orders   Comprehensive metabolic panel   Lipid panel   Diabetes mellitus type II, non insulin dependent (HCC)   Relevant Orders   CBC with Differential/Platelet   Comprehensive metabolic panel   Hemoglobin A1c   Lipid panel   Microalbumin / creatinine urine ratio   Other Visit Diagnoses     Need for influenza vaccination    -  Primary   Relevant Orders   Flu vaccine trivalent PF, 6mos and  older(Flulaval,Afluria,Fluarix,Fluzone) (Completed)   Tinea       Relevant Medications   clotrimazole-betamethasone (LOTRISONE) cream   Tobacco abuse       Relevant Orders   Ambulatory Referral Lung Cancer Screening [facility]      Assessment and Plan    Essential Tremor Stable on current medication, but patient requests longer prescription duration due to insurance constraints. -Continue current regimen of Tramadol 3 times daily. -Extend prescription duration as per patient's request.  Chronic Back Pain Patient reports benefit from physical therapy and TENS unit use, but therapy was interrupted due to personal circumstances. -Refer patient back to physical therapy. -Advise continued use of TENS unit at home.  Constipation Likely secondary to Tramadol use, patient reports bowel movements approximately once per week. -Advise over-the-counter stool softener or fiber supplement.  Diabetes  [place] Patient has not been monitoring blood sugars due to issues with glucose meter. -Coordinate with office staff to assist patient in obtaining a new glucose meter.  Peripheral Neuropathy Patient reports numbness in feet, but symptoms are manageable with current medication regimen. -Continue Gabapentin 300mg  three times daily.  General Health Maintenance -Administered influenza vaccine today. -Discussed lung cancer screening program, patient to consider. -Encourage regular eye exams, patient reports recent visit.         No follow-ups on file.    Donato Schultz, DO

## 2023-02-17 NOTE — Patient Instructions (Signed)

## 2023-02-18 LAB — CBC WITH DIFFERENTIAL/PLATELET
Absolute Monocytes: 766 {cells}/uL (ref 200–950)
Basophils Absolute: 63 cells/uL (ref 0–200)
Basophils Relative: 0.8 %
Eosinophils Absolute: 427 {cells}/uL (ref 15–500)
Eosinophils Relative: 5.4 %
HCT: 44.7 % (ref 38.5–50.0)
Hemoglobin: 14.9 g/dL (ref 13.2–17.1)
Lymphs Abs: 2496 {cells}/uL (ref 850–3900)
MCH: 29.4 pg (ref 27.0–33.0)
MCHC: 33.3 g/dL (ref 32.0–36.0)
MCV: 88.2 fL (ref 80.0–100.0)
MPV: 10.9 fL (ref 7.5–12.5)
Monocytes Relative: 9.7 %
Neutro Abs: 4148 {cells}/uL (ref 1500–7800)
Neutrophils Relative %: 52.5 %
Platelets: 184 10*3/uL (ref 140–400)
RBC: 5.07 10*6/uL (ref 4.20–5.80)
RDW: 15 % (ref 11.0–15.0)
Total Lymphocyte: 31.6 %
WBC: 7.9 10*3/uL (ref 3.8–10.8)

## 2023-02-18 LAB — COMPREHENSIVE METABOLIC PANEL
AG Ratio: 1.4 (calc) (ref 1.0–2.5)
ALT: 54 U/L — ABNORMAL HIGH (ref 9–46)
AST: 55 U/L — ABNORMAL HIGH (ref 10–35)
Albumin: 4.3 g/dL (ref 3.6–5.1)
Alkaline phosphatase (APISO): 70 U/L (ref 35–144)
BUN: 14 mg/dL (ref 7–25)
CO2: 27 mmol/L (ref 20–32)
Calcium: 9.7 mg/dL (ref 8.6–10.3)
Chloride: 102 mmol/L (ref 98–110)
Creat: 1.04 mg/dL (ref 0.70–1.35)
Globulin: 3.1 g/dL (ref 1.9–3.7)
Glucose, Bld: 117 mg/dL — ABNORMAL HIGH (ref 65–99)
Potassium: 4.3 mmol/L (ref 3.5–5.3)
Sodium: 140 mmol/L (ref 135–146)
Total Bilirubin: 1 mg/dL (ref 0.2–1.2)
Total Protein: 7.4 g/dL (ref 6.1–8.1)

## 2023-02-18 LAB — HEMOGLOBIN A1C
Hgb A1c MFr Bld: 9.1 %{Hb} — ABNORMAL HIGH (ref <5.7)
Mean Plasma Glucose: 214 mg/dL
eAG (mmol/L): 11.9 mmol/L

## 2023-02-18 LAB — MICROALBUMIN / CREATININE URINE RATIO
Creatinine, Urine: 237 mg/dL (ref 20–320)
Microalb Creat Ratio: 89 mg/g{creat} — ABNORMAL HIGH (ref <30)
Microalb, Ur: 21.1 mg/dL

## 2023-02-18 LAB — LIPID PANEL
Cholesterol: 193 mg/dL (ref <200)
HDL: 41 mg/dL (ref >=40)
LDL Cholesterol (Calc): 128 mg/dL — ABNORMAL HIGH
Non-HDL Cholesterol (Calc): 152 mg/dL — ABNORMAL HIGH (ref <130)
Total CHOL/HDL Ratio: 4.7 (calc) (ref <5.0)
Triglycerides: 127 mg/dL (ref <150)

## 2023-02-18 LAB — PSA: PSA: 0.12 ng/mL (ref <=4.00)

## 2023-02-23 ENCOUNTER — Other Ambulatory Visit: Payer: Self-pay | Admitting: Family Medicine

## 2023-02-23 DIAGNOSIS — Z951 Presence of aortocoronary bypass graft: Secondary | ICD-10-CM

## 2023-02-23 DIAGNOSIS — I2510 Atherosclerotic heart disease of native coronary artery without angina pectoris: Secondary | ICD-10-CM

## 2023-02-24 ENCOUNTER — Telehealth: Payer: Self-pay | Admitting: Pharmacist

## 2023-02-24 DIAGNOSIS — E119 Type 2 diabetes mellitus without complications: Secondary | ICD-10-CM

## 2023-02-24 MED ORDER — DEXCOM G7 RECEIVER DEVI: 0 refills | Status: DC

## 2023-02-24 MED ORDER — DEXCOM G7 RECEIVER DEVI: 0 refills | Status: AC

## 2023-02-24 MED ORDER — DEXCOM G7 SENSOR MISC: 1 refills | Status: AC

## 2023-02-24 NOTE — Telephone Encounter (Signed)
Received message from patient's PCP that he has not been able to use his Dex Com Continuous Glucose Monitor recently.  Called patient. He reports that he has not been able to get into his account using the [facility] phone app. He is requesting to Dex Com 7 reader.  Sent in Rx to [facility] for both the sensors and reader / receiver. Previously patient was able to get thru [facility] with $0 copay.  Appointment made for Monday 02/27/2023 to assist patient with restarting sensor use and resetting his DexCom app on his phone.   Will also discuss med adherence, hyperlipidemia and diabetes.

## 2023-02-27 ENCOUNTER — Ambulatory Visit: Payer: PRIVATE HEALTH INSURANCE

## 2023-03-07 ENCOUNTER — Ambulatory Visit: Payer: PRIVATE HEALTH INSURANCE | Attending: Family Medicine | Admitting: Physical Therapy

## 2023-03-08 ENCOUNTER — Ambulatory Visit: Payer: PRIVATE HEALTH INSURANCE

## 2023-03-16 ENCOUNTER — Other Ambulatory Visit: Payer: Self-pay | Admitting: Family Medicine

## 2023-03-16 DIAGNOSIS — M792 Neuralgia and neuritis, unspecified: Secondary | ICD-10-CM
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
## 2023-03-28 ENCOUNTER — Ambulatory Visit: Payer: PRIVATE HEALTH INSURANCE | Attending: Family Medicine | Admitting: Physical Therapy

## 2023-03-28 ENCOUNTER — Encounter: Payer: Self-pay | Admitting: Physical Therapy

## 2023-03-28 ENCOUNTER — Other Ambulatory Visit: Payer: Self-pay

## 2023-03-28 DIAGNOSIS — M5416 Radiculopathy, lumbar region: Secondary | ICD-10-CM | POA: Insufficient documentation

## 2023-03-28 DIAGNOSIS — M62830 Muscle spasm of back: Secondary | ICD-10-CM | POA: Diagnosis present

## 2023-03-28 DIAGNOSIS — R262 Difficulty in walking, not elsewhere classified: Secondary | ICD-10-CM | POA: Diagnosis present

## 2023-03-28 DIAGNOSIS — M48061 Spinal stenosis, lumbar region without neurogenic claudication: Secondary | ICD-10-CM | POA: Diagnosis not present

## 2023-03-28 DIAGNOSIS — R293 Abnormal posture: Secondary | ICD-10-CM | POA: Diagnosis present

## 2023-03-28 DIAGNOSIS — M6281 Muscle weakness (generalized): Secondary | ICD-10-CM | POA: Diagnosis present

## 2023-03-28 DIAGNOSIS — M5459 Other low back pain: Secondary | ICD-10-CM | POA: Insufficient documentation

## 2023-04-04 ENCOUNTER — Ambulatory Visit: Payer: PRIVATE HEALTH INSURANCE

## 2023-04-04 DIAGNOSIS — M6281 Muscle weakness (generalized): Secondary | ICD-10-CM

## 2023-04-04 DIAGNOSIS — M5416 Radiculopathy, lumbar region: Secondary | ICD-10-CM

## 2023-04-04 DIAGNOSIS — M62830 Muscle spasm of back: Secondary | ICD-10-CM

## 2023-04-04 DIAGNOSIS — M5459 Other low back pain: Secondary | ICD-10-CM

## 2023-04-04 DIAGNOSIS — R262 Difficulty in walking, not elsewhere classified: Secondary | ICD-10-CM

## 2023-04-04 DIAGNOSIS — R293 Abnormal posture: Secondary | ICD-10-CM

## 2023-04-04 NOTE — Therapy (Signed)
OUTPATIENT PHYSICAL THERAPY THORACOLUMBAR TREATMENT   Patient Name: Dennis Hopkins MRN: [id number] DOB:02-Sep-1960, 62 y.o., male Today's Date: 04/04/2023  END OF SESSION:  PT End of Session - 04/04/23 1658     Visit Number 2    Date for PT Re-Evaluation 05/23/23    Authorization Type [employer]    PT Start Time 1619    PT Stop Time 1701    PT Time Calculation (min) 42 min    Activity Tolerance Patient tolerated treatment well    Behavior During Therapy [facility] for tasks assessed/performed               Past Medical History:  Diagnosis Date   Chickenpox    Hyperlipemia    Hypertension    Kidney stones 2013   Past Surgical History:  Procedure Laterality Date   CARDIAC CATHETERIZATION N/A 01/07/2016   Procedure: Left Heart Cath and Coronary Angiography;  Surgeon: Corky Crafts, MD;  Location: [facility] INVASIVE CV LAB;  Service: Cardiovascular;  Laterality: N/A;   CORONARY ARTERY BYPASS GRAFT N/A 01/13/2016   Procedure: CORONARY ARTERY BYPASS GRAFTING (CABG) x4 Endoscopic Harvesting of the Right Greater Saphenous Vein;  Surgeon: Loreli Slot, MD;  Location: [facility] OR;  Service: Open Heart Surgery;  Laterality: N/A;   TEE WITHOUT CARDIOVERSION N/A 01/13/2016   Procedure: TRANSESOPHAGEAL ECHOCARDIOGRAM (TEE);  Surgeon: Loreli Slot, MD;  Location: [facility] OR;  Service: Open Heart Surgery;  Laterality: N/A;   TONSILLECTOMY     Patient Active Problem List   Diagnosis Date Noted   Eczema 04/12/2022   Lumbar foraminal stenosis 01/05/2022   Type 2 diabetes mellitus with diabetic peripheral angiopathy without gangrene, without long-term current use of insulin (HCC) 09/06/2021   Calcified granuloma of lung 09/06/2021   Peripheral neuropathic pain 09/06/2021   Type 2 diabetes mellitus with hyperglycemia, without long-term current use of insulin (HCC) 04/13/2021   Diabetes mellitus (HCC) 04/13/2021   Hypertension    Hyperlipemia    Chickenpox    Urinary frequency 08/24/2020    Snoring 08/24/2020   Rash 08/24/2020   Kidney stones 02/14/2019   Acute left-sided low back pain with right-sided sciatica 02/14/2019   Diabetes mellitus type II, non insulin dependent (HCC) 07/19/2016   Diabetes (HCC) 01/18/2016   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   Coronary artery disease 01/12/2016   Exertional angina (HCC)    Chest pain 11/11/2015   HTN (hypertension) 10/20/2015   Hyperlipidemia LDL goal <70 10/20/2015   Preventative health care 09/29/2014   Obesity (BMI 30-39.9) 09/26/2013   Tobacco use disorder 09/26/2013    PCP: Donato Schultz, DO   REFERRING PROVIDER: Donato Schultz, DO   REFERRING DIAG: [phone] (ICD-10-CM) - Lumbar foraminal stenosis   THERAPY DIAG:  Other low back pain  Radiculopathy, lumbar region  Abnormal posture  Muscle weakness (generalized)  Difficulty in walking, not elsewhere classified  Muscle spasm of back  RATIONALE FOR EVALUATION AND TREATMENT: Rehabilitation  ONSET DATE: ~1 year   NEXT MD VISIT: PRN - none scheduled   SUBJECTIVE:  SUBJECTIVE STATEMENT: Pt reports back is hurting pretty bad today, he worked earlier.  PAIN: Are you having pain? Yes: NPRS scale: 7/10 Pain location: B low back Pain description: nagging Aggravating factors: prolonged sitting (driving a forklift at work), lifting, walking Relieving factors: heat, Rx meds - muscle relaxant makes him drowsy  PERTINENT HISTORY:  HTN; DM-II; CA s/p CABG x 4 - 2017; Peripheral neuropathic pain; Obesity  PRECAUTIONS: None  RED FLAGS: None  WEIGHT BEARING RESTRICTIONS: No  FALLS:  Has patient fallen in last 6 months? 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   Alignment:  Facet mediated anterolisthesis of L5 on S1 of 3 mm.   Vertebrae: No fracture, suspicious marrow lesion, or significant marrow edema. Hemangioma in the L5 vertebral body. Small L5 superior endplate Schmorl's node. Multilevel Modic type 2 endplate changes.   Conus medullaris and cauda equina: Conus extends to the L1 level. Conus and cauda equina appear normal.   Paraspinal and other soft tissues: Unremarkable.   Disc levels:   Mild diffuse lumbar disc desiccation. Moderate disc space narrowing at L3-4 and L4-5.   T12-L1: Mild disc bulging without stenosis.   L1-2: Disc bulging without stenosis.   L2-3: Disc bulging and mild facet hypertrophy without stenosis.   L3-4: Disc bulging results in borderline to mild bilateral lateral recess stenosis and mild-to-moderate bilateral neural foraminal stenosis without significant spinal stenosis.   L4-5: Disc bulging results in borderline to mild bilateral lateral recess stenosis and moderate bilateral neural foraminal stenosis without significant spinal stenosis.   L5-S1: Anterolisthesis with bulging uncovered disc and moderate right and severe left facet hypertrophy result in moderate bilateral neural foraminal stenosis without spinal stenosis.   IMPRESSION: 1. Multilevel lumbar disc and facet degeneration with moderate neural foraminal stenosis at L4-5 and L5-S1. 2. No significant spinal stenosis.  11/02/21 - Lumbar spine x-ray: FINDINGS: There is no evidence for lumbar spine fracture. Spinal alignment is normal. There is mild-to-moderate disc space narrowing  at L3-L4 and L4-L5, mildly progressed. Mild disc space narrowing at L5-S1 appears stable. Endplate osteophytes are seen throughout. There is facet arthropathy at L5-S1. Soft tissues are within normal limits.   IMPRESSION: 1. No acute fracture or malalignment. 2. Moderate multilevel degenerative changes throughout the spine have mildly progressed compared to 2020.  PATIENT SURVEYS:  Modified Oswestry 18 / 50 = 36.0 %  FOTO lumbar spine 52, predicted 60  SCREENING FOR RED FLAGS: Bowel or bladder incontinence: No Spinal tumors: No Cauda equina syndrome: No Compression fracture: No Abdominal aneurysm: No  COGNITION:  Overall cognitive status: Within functional limits for tasks assessed    SENSATION: Radicular pain down L>R LE to calves B diabetic peripheral neuropathy in B feet  MUSCLE LENGTH: Hamstrings: mod/severe tight L>R ITB: mod/severe tight L>R Piriformis: mod/severe tight L>R Hip flexors: mod tight B Quads: mod tight B Heelcord: NT  POSTURE:  rounded shoulders, forward head, decreased lumbar lordosis, and flexed trunk   PALPATION: Increased muscle tension in bilateral lumbar paraspinals and upper glutes, however patient denies TTP  LUMBAR ROM:  Active  03/28/23 - Eval  Flexion Hands to ankles - HS tightness  Extension 25% limited  Right lateral flexion Hand to fibular head ^  Left lateral flexion Hand to lateral calf  Right rotation 10% limited  Left rotation 40% limited  (Blank rows = not tested, ^ = increased pain)  LOWER EXTREMITY ROM:    Limited B hip extension and ER   LOWER EXTREMITY MMT:    MMT Right eval Left eval  Hip flexion 5 4  Hip extension 4 4+  Hip abduction 4 4  Hip adduction 4+ 4+  Hip internal rotation 5 5  Hip external rotation 5 5  Knee flexion 5 5  Knee extension 5 5  Ankle dorsiflexion 5 4+  Ankle plantarflexion 5 5  Ankle inversion    Ankle eversion     (Blank rows = not tested)  LUMBAR SPECIAL TESTS:  Straight leg raise  test: Positive and Slump test: Positive - bilateral  GAIT: Distance walked: clinic distances Assistive device utilized: None Level of assistance: Complete Independence Gait pattern: step through pattern and trunk flexed   TODAY'S TREATMENT:  04/04/23 Manual Therapy: to decrease muscle spasm, pain and improve mobility.  STM to bil thoraco-lumbar PS, glutes, QL and IASTM with foam roll  Therapeutic Exercise: to improve strength and mobility.  Demo, verbal and tactile cues throughout for technique.  SKTC stretch x 30" bil LTR 3x10" both ways Brief review of remainder of HEP Hooklying pelvic tils 10x3" Hooklying marches with core brace x 10 bil Bridges x 10 with TrA  03/28/23 - Eval  THERAPEUTIC EXERCISE: to improve flexibility, strength and mobility.  Demonstration, verbal and tactile cues throughout for technique. SKTC stretch 2 x 30" Hooklying B LTR 2 x 10" POE x 1 min Prone press up x 3 Demonstrated lumbar extension options in standing but not attempted   PATIENT EDUCATION:  Education details: PT eval findings, anticipated POC, and initial HEP Person educated: Patient Education method: Explanation, Demonstration, Verbal cues, and Handouts Education comprehension: verbalized understanding, returned demonstration, verbal cues required, and needs further education  HOME EXERCISE PROGRAM: Access Code: [email or web address] URL: [email or web address] Date: 04/04/2023 Prepared by: Verta Ellen  Exercises - Supine Single Knee to Chest Stretch  - 2-3 x daily - 7 x weekly - 3 reps - 30 sec hold - Supine Lower Trunk Rotation  - 2-3 x daily - 7 x weekly - 5 reps - 10 sec hold - Static Prone on Elbows  - 2-3 x daily - 7 x weekly - 2 sets - 3 reps - 30 sec hold - Standing Lumbar Extension with Counter  - 2-3 x daily - 7 x weekly - 2 sets - 10 reps - 3 sec hold - Standing Lumbar Extension at Wall - Forearms  - 2-3 x daily - 7 x weekly - 2 sets - 10 reps - 3 sec hold - Supine  Posterior Pelvic Tilt  - 2-3 x daily - 7 x weekly - 2 sets - 10 reps - Supine March  - 1 x daily - 7 x weekly - 2-3 sets - 10 reps - Supine Bridge  - 1 x daily - 7 x weekly - 2-3 sets - 10 reps   ASSESSMENT:  CLINICAL IMPRESSION: [name] arrived pretty flared up today from work. Started session with STM to reduce pain and muscle tension, which helped a lot with pain. Followed with gentle lumbo-pelvic movements for mobility and strength. Instruction also given on self STM with  tennis ball. Post session pt noted improved pain from 7/10 to 4/10. He is still onboard with attempting 2x per week frequency. Darryl will benefit from skilled PT to address above deficits to improve mobility and activity tolerance with decreased pain interference.  OBJECTIVE IMPAIRMENTS: Abnormal gait, decreased activity tolerance, decreased endurance, decreased knowledge of condition, decreased mobility, difficulty walking, decreased ROM, decreased strength, hypomobility, increased fascial restrictions, impaired perceived functional ability, increased muscle spasms, impaired flexibility, impaired sensation, improper body mechanics, postural dysfunction, and pain.   ACTIVITY LIMITATIONS: carrying, lifting, bending, sitting, standing, squatting, sleeping, stairs, transfers, bathing, dressing, reach over head, locomotion level, and caring for others  PARTICIPATION LIMITATIONS: meal prep, cleaning, laundry, driving, shopping, community activity, occupation, and yard work  PERSONAL FACTORS: Fitness, Past/current experiences, Profession, Time since onset of injury/illness/exacerbation, and 3+ comorbidities: HTN; DM-II; CA s/p CABG x 4 - 2017; Peripheral neuropathic pain; Obesity  are also affecting patient's functional outcome.   REHAB POTENTIAL: Good  CLINICAL DECISION MAKING: Evolving/moderate complexity  EVALUATION COMPLEXITY: Moderate   GOALS: Goals reviewed with patient? Yes  SHORT TERM GOALS: Target date:  04/25/2023   Patient will be independent with initial HEP to improve outcomes and carryover.  Baseline: Initial HEP provided on eval Goal status: IN PROGRESS  2.  Patient will report centralization of radicular symptoms.  Baseline: L>R LE radicular pain down to calf Goal status: IN PROGRESS  LONG TERM GOALS: Target date: 05/23/2023   Patient will be independent with ongoing/advanced HEP for self-management at home.  Baseline:  Goal status: IN PROGRESS  2.  Patient will report 50-75% improvement in low back pain and L LE radicular pain to improve QOL.  Baseline: 5/10, up to 9/10 with L>R LE radicular pain Goal status: IN PROGRESS  3.  Patient to demonstrate ability to achieve and maintain good spinal alignment/posturing and body mechanics needed for daily activities. Baseline: Flexed trunk posture with rounded shoulders and forward head Goal status: IN PROGRESS  4.  Patient will demonstrate functional pain free lumbar ROM to perform ADLs.   Baseline: Refer to above lumbar ROM table Goal status: IN PROGRESS  5.  Patient will demonstrate improved B LE strength to >/= 4+/5 for improved stability and ease of mobility . Baseline: Refer to above MMT table Goal status: IN PROGRESS  6.  Patient will report 60 on lumbar FOTO to demonstrate improved functional ability.  Baseline: 52 Goal status: IN PROGRESS   7. Patient will report </= 24% on Modified Oswestry to demonstrate improved functional ability with decreased pain interference. Baseline: 18 / 50 = 36.0 %  Goal status: IN PROGRESS  8.  Patient will report ability to resume walking for wellness program at work without limitation due to LBP or L LE radiculopathy. Baseline: Patient has been unable to walk for wellness program due to pain Goal status: IN PROGRESS   PLAN:  PT FREQUENCY: 1-2x/week pending pt ability to afford copay  PT DURATION: 6-8 weeks  PLANNED INTERVENTIONS: Therapeutic exercises, Therapeutic activity,  Neuromuscular re-education, Balance training, Gait training, Patient/Family education, Self Care, Joint mobilization, Stair training, Aquatic Therapy, Dry Needling, Electrical stimulation, Spinal manipulation, Spinal mobilization, Cryotherapy, Moist heat, Taping, Traction, Ultrasound, Ionotophoresis 4mg /ml Dexamethasone, Manual therapy, and Re-evaluation  PLAN FOR NEXT SESSION: progress lumbopelvic flexibility and strengthening with extension preference;  MT +/- DN as indicated (good response to [facility]), would benefit from DN   Darleene Cleaver, PTA 04/04/2023, 5:15 PM

## 2023-04-10 ENCOUNTER — Ambulatory Visit: Payer: PRIVATE HEALTH INSURANCE | Admitting: Physical Therapy

## 2023-04-10 ENCOUNTER — Encounter: Payer: Self-pay | Admitting: Physical Therapy

## 2023-04-10 DIAGNOSIS — R293 Abnormal posture: Secondary | ICD-10-CM

## 2023-04-10 DIAGNOSIS — R262 Difficulty in walking, not elsewhere classified: Secondary | ICD-10-CM

## 2023-04-10 DIAGNOSIS — I639 Cerebral infarction, unspecified: Secondary | ICD-10-CM | POA: Diagnosis not present

## 2023-04-10 DIAGNOSIS — I63512 Cerebral infarction due to unspecified occlusion or stenosis of left middle cerebral artery: Secondary | ICD-10-CM | POA: Diagnosis not present

## 2023-04-10 DIAGNOSIS — M5416 Radiculopathy, lumbar region: Secondary | ICD-10-CM

## 2023-04-10 DIAGNOSIS — M5459 Other low back pain: Secondary | ICD-10-CM

## 2023-04-10 DIAGNOSIS — M62830 Muscle spasm of back: Secondary | ICD-10-CM

## 2023-04-10 DIAGNOSIS — M6281 Muscle weakness (generalized): Secondary | ICD-10-CM

## 2023-04-10 NOTE — Therapy (Signed)
OUTPATIENT PHYSICAL THERAPY TREATMENT   Patient Name: Dennis Hopkins MRN: [id number] DOB:1960-10-26, 62 y.o., male Today's Date: 04/10/2023  END OF SESSION:  PT End of Session - 04/10/23 1618     Visit Number 3    Date for PT Re-Evaluation 05/23/23    Authorization Type [employer]    PT Start Time 1618    PT Stop Time 1659    PT Time Calculation (min) 41 min    Activity Tolerance Patient tolerated treatment well    Behavior During Therapy [facility] for tasks assessed/performed                Past Medical History:  Diagnosis Date   Chickenpox    Hyperlipemia    Hypertension    Kidney stones 2013   Past Surgical History:  Procedure Laterality Date   CARDIAC CATHETERIZATION N/A 01/07/2016   Procedure: Left Heart Cath and Coronary Angiography;  Surgeon: Corky Crafts, MD;  Location: [facility] INVASIVE CV LAB;  Service: Cardiovascular;  Laterality: N/A;   CORONARY ARTERY BYPASS GRAFT N/A 01/13/2016   Procedure: CORONARY ARTERY BYPASS GRAFTING (CABG) x4 Endoscopic Harvesting of the Right Greater Saphenous Vein;  Surgeon: Loreli Slot, MD;  Location: [facility] OR;  Service: Open Heart Surgery;  Laterality: N/A;   TEE WITHOUT CARDIOVERSION N/A 01/13/2016   Procedure: TRANSESOPHAGEAL ECHOCARDIOGRAM (TEE);  Surgeon: Loreli Slot, MD;  Location: [facility] OR;  Service: Open Heart Surgery;  Laterality: N/A;   TONSILLECTOMY     Patient Active Problem List   Diagnosis Date Noted   Eczema 04/12/2022   Lumbar foraminal stenosis 01/05/2022   Type 2 diabetes mellitus with diabetic peripheral angiopathy without gangrene, without long-term current use of insulin (HCC) 09/06/2021   Calcified granuloma of lung 09/06/2021   Peripheral neuropathic pain 09/06/2021   Type 2 diabetes mellitus with hyperglycemia, without long-term current use of insulin (HCC) 04/13/2021   Diabetes mellitus (HCC) 04/13/2021   Hypertension    Hyperlipemia    Chickenpox    Urinary frequency 08/24/2020   Snoring  08/24/2020   Rash 08/24/2020   Kidney stones 02/14/2019   Acute left-sided low back pain with right-sided sciatica 02/14/2019   Diabetes mellitus type II, non insulin dependent (HCC) 07/19/2016   Diabetes (HCC) 01/18/2016   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   Coronary artery disease 01/12/2016   Exertional angina (HCC)    Chest pain 11/11/2015   HTN (hypertension) 10/20/2015   Hyperlipidemia LDL goal <70 10/20/2015   Preventative health care 09/29/2014   Obesity (BMI 30-39.9) 09/26/2013   Tobacco use disorder 09/26/2013    PCP: Donato Schultz, DO   REFERRING PROVIDER: Donato Schultz, DO   REFERRING DIAG: [phone] (ICD-10-CM) - Lumbar foraminal stenosis   THERAPY DIAG:  Other low back pain  Radiculopathy, lumbar region  Abnormal posture  Muscle weakness (generalized)  Difficulty in walking, not elsewhere classified  Muscle spasm of back  RATIONALE FOR EVALUATION AND TREATMENT: Rehabilitation  ONSET DATE: ~1 year   NEXT MD VISIT: PRN - none scheduled   SUBJECTIVE:  SUBJECTIVE STATEMENT: Pt reports the HEP stretches seem to help some.  PAIN: Are you having pain? Yes: NPRS scale:  6/10 Pain location: B low back Pain description: nagging Aggravating factors: prolonged sitting (driving a forklift at work), lifting, walking Relieving factors: heat, Rx meds - muscle relaxant makes him drowsy  PERTINENT HISTORY:  HTN; DM-II; CA s/p CABG x 4 - 2017; Peripheral neuropathic pain; Obesity  PRECAUTIONS: None  RED FLAGS: None  WEIGHT BEARING RESTRICTIONS: No  FALLS:  Has patient fallen in last 6 months? 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   Alignment:  Facet mediated anterolisthesis of L5 on S1 of 3 mm.   Vertebrae: No fracture, suspicious marrow lesion, or significant marrow edema. Hemangioma in the L5 vertebral body. Small L5 superior endplate Schmorl's node. Multilevel Modic type 2 endplate changes.   Conus medullaris and cauda equina: Conus extends to the L1 level. Conus and cauda equina appear normal.   Paraspinal and other soft tissues: Unremarkable.   Disc levels:   Mild diffuse lumbar disc desiccation. Moderate disc space narrowing at L3-4 and L4-5.   T12-L1: Mild disc bulging without stenosis.   L1-2: Disc bulging without stenosis.   L2-3: Disc bulging and mild facet hypertrophy without stenosis.   L3-4: Disc bulging results in borderline to mild bilateral lateral recess stenosis and mild-to-moderate bilateral neural foraminal stenosis without significant spinal stenosis.   L4-5: Disc bulging results in borderline to mild bilateral lateral recess stenosis and moderate bilateral neural foraminal stenosis without significant spinal stenosis.   L5-S1: Anterolisthesis with bulging uncovered disc and moderate right and severe left facet hypertrophy result in moderate bilateral neural foraminal stenosis without spinal stenosis.   IMPRESSION: 1. Multilevel lumbar disc and facet degeneration with moderate neural foraminal stenosis at L4-5 and L5-S1. 2. No significant spinal stenosis.  11/02/21 - Lumbar spine x-ray: FINDINGS: There is no evidence for lumbar spine fracture. Spinal alignment is normal. There is mild-to-moderate disc space narrowing at L3-L4 and  L4-L5, mildly progressed. Mild disc space narrowing at L5-S1 appears stable. Endplate osteophytes are seen throughout. There is facet arthropathy at L5-S1. Soft tissues are within normal limits.   IMPRESSION: 1. No acute fracture or malalignment. 2. Moderate multilevel degenerative changes throughout the spine have mildly progressed compared to 2020.  PATIENT SURVEYS:  Modified Oswestry 18 / 50 = 36.0 %  FOTO lumbar spine 52, predicted 60  SCREENING FOR RED FLAGS: Bowel or bladder incontinence: No Spinal tumors: No Cauda equina syndrome: No Compression fracture: No Abdominal aneurysm: No  COGNITION:  Overall cognitive status: Within functional limits for tasks assessed    SENSATION: Radicular pain down L>R LE to calves B diabetic peripheral neuropathy in B feet  MUSCLE LENGTH: Hamstrings: mod/severe tight L>R ITB: mod/severe tight L>R Piriformis: mod/severe tight L>R Hip flexors: mod tight B Quads: mod tight B Heelcord: NT  POSTURE:  rounded shoulders, forward head, decreased lumbar lordosis, and flexed trunk   PALPATION: Increased muscle tension in bilateral lumbar paraspinals and upper glutes, however patient denies TTP  LUMBAR ROM:   Active  03/28/23 - Eval  Flexion Hands to ankles - HS tightness  Extension 25% limited  Right lateral flexion Hand to fibular head ^  Left lateral flexion Hand to lateral calf  Right rotation 10% limited  Left rotation 40% limited  (Blank rows = not tested, ^ = increased pain)  LOWER EXTREMITY ROM:    Limited B hip extension and ER   LOWER EXTREMITY MMT:    MMT Right eval Left eval  Hip flexion 5 4  Hip extension 4 4+  Hip abduction 4 4  Hip adduction 4+ 4+  Hip internal rotation 5 5  Hip external rotation 5 5  Knee flexion 5 5  Knee extension 5 5  Ankle dorsiflexion 5 4+  Ankle plantarflexion 5 5  Ankle inversion    Ankle eversion     (Blank rows = not tested)  LUMBAR SPECIAL TESTS:  Straight leg raise test:  Positive and Slump test: Positive - bilateral  GAIT: Distance walked: clinic distances Assistive device utilized: None Level of assistance: Complete Independence Gait pattern: step through pattern and trunk flexed   TODAY'S TREATMENT:   04/10/23  THERAPEUTIC EXERCISE: to improve flexibility, strength and mobility.  Demonstration, verbal and tactile cues throughout for technique. Rec Bike - L2 x 6 min POE x 1 min POE + alt fwd/OH reach x 10 Quadruped cat/cow x 10 Quad rocking from child's pose to prone press-up x 10  MANUAL THERAPY: To promote normalized muscle tension, improved flexibility, and reduced pain. Skilled palpation and monitoring of soft tissue during DN Trigger Point Dry-Needling  Treatment instructions: Expect mild to moderate muscle soreness. Patient verbalized understanding of these instructions and education. Patient Consent Given: Yes Education handout provided: Yes Muscles treated: B QL, lumbar erector spinae and multifidi Electrical stimulation performed: No Parameters: N/A Treatment response/outcome: Twitch Response Elicited and Palpable Increase in Muscle Length STM/DTM, manual TPR and pin & stretch to muscles addressed with DN   04/04/23 Manual Therapy: to decrease muscle spasm, pain and improve mobility.  STM to bil thoraco-lumbar PS, glutes, QL and IASTM with foam roll  Therapeutic Exercise: to improve strength and mobility.  Demo, verbal and tactile cues throughout for technique.  SKTC stretch x 30" bil LTR 3x10" both ways Brief review of remainder of HEP Hooklying pelvic tils 10x3" Hooklying marches with core brace x 10 bil Bridges x 10 with TrA   03/28/23 - Eval  THERAPEUTIC EXERCISE: to improve flexibility, strength and mobility.  Demonstration, verbal and tactile cues throughout for technique. SKTC stretch 2 x 30" Hooklying B LTR 2 x 10" POE x 1 min Prone press up x 3 Demonstrated lumbar extension options in standing but not  attempted   PATIENT EDUCATION:  Education details: postural awareness, role of DN, DN rational, procedure, outcomes, potential side effects, and recommended post-treatment exercises/activity, and continue HEP Person educated: Patient Education method: Explanation, Demonstration, and Handouts Education comprehension: verbalized understanding and needs further education  HOME EXERCISE PROGRAM: Access Code: [email or web address] URL: [email or web address] Date: 04/04/2023 Prepared by: Verta Ellen  Exercises - Supine Single Knee to Chest Stretch  - 2-3 x daily - 7 x weekly - 3 reps - 30 sec hold - Supine Lower Trunk Rotation  - 2-3 x daily - 7 x weekly - 5 reps - 10 sec hold - Static Prone on Elbows  - 2-3 x daily - 7 x weekly - 2 sets - 3 reps - 30 sec hold - Standing Lumbar Extension with Counter  - 2-3 x daily -  7 x weekly - 2 sets - 10 reps - 3 sec hold - Standing Lumbar Extension at Wall - Forearms  - 2-3 x daily - 7 x weekly - 2 sets - 10 reps - 3 sec hold - Supine Posterior Pelvic Tilt  - 2-3 x daily - 7 x weekly - 2 sets - 10 reps - Supine March  - 1 x daily - 7 x weekly - 2-3 sets - 10 reps - Supine Bridge  - 1 x daily - 7 x weekly - 2-3 sets - 10 reps   ASSESSMENT:  CLINICAL IMPRESSION: Zade reports the stretches seem to help but he continues to note a lot of muscle tension/tightness in his L>R low back and buttock with radiation down L LE.  Discussed possibility of incorporating DN into manual therapy to help with reduction in taut bands and trigger points for improved muscle tension.  After explanation of DN rational, procedures, outcomes and potential side effects, patient verbalized consent to DN treatment in conjunction with manual STM/DTM and TPR to reduce ttp/muscle tension. Muscles treated as indicated above. DN produced normal response with good twitches elicited resulting in palpable reduction in pain/ttp and muscle tension. Pt educated to expect mild to moderate  muscle soreness for up to 24-48 hrs and instructed to continue prescribed HEP and current activity level with pt verbalizing understanding of these instructions.  Progressed therapeutic exercises focusing on lumbopelvic mobilization and extension ROM with patient noting further improvement in pain.  Briefly discussed postural awareness, emphasizing posture in sitting as patient noted to sit in slumped posture, however patient will benefit from extensive posture and body mechanics education in future visits.  Inocencio will benefit from skilled PT to address above deficits to improve mobility and activity tolerance with decreased pain interference.   OBJECTIVE IMPAIRMENTS: Abnormal gait, decreased activity tolerance, decreased endurance, decreased knowledge of condition, decreased mobility, difficulty walking, decreased ROM, decreased strength, hypomobility, increased fascial restrictions, impaired perceived functional ability, increased muscle spasms, impaired flexibility, impaired sensation, improper body mechanics, postural dysfunction, and pain.   ACTIVITY LIMITATIONS: carrying, lifting, bending, sitting, standing, squatting, sleeping, stairs, transfers, bathing, dressing, reach over head, locomotion level, and caring for others  PARTICIPATION LIMITATIONS: meal prep, cleaning, laundry, driving, shopping, community activity, occupation, and yard work  PERSONAL FACTORS: Fitness, Past/current experiences, Profession, Time since onset of injury/illness/exacerbation, and 3+ comorbidities: HTN; DM-II; CA s/p CABG x 4 - 2017; Peripheral neuropathic pain; Obesity  are also affecting patient's functional outcome.   REHAB POTENTIAL: Good  CLINICAL DECISION MAKING: Evolving/moderate complexity  EVALUATION COMPLEXITY: Moderate   GOALS: Goals reviewed with patient? Yes  SHORT TERM GOALS: Target date: 04/25/2023   Patient will be independent with initial HEP to improve outcomes and carryover.  Baseline:  Initial HEP provided on eval Goal status: IN PROGRESS  2.  Patient will report centralization of radicular symptoms.  Baseline: L>R LE radicular pain down to calf Goal status: IN PROGRESS  LONG TERM GOALS: Target date: 05/23/2023   Patient will be independent with ongoing/advanced HEP for self-management at home.  Baseline:  Goal status: IN PROGRESS  2.  Patient will report 50-75% improvement in low back pain and L LE radicular pain to improve QOL.  Baseline: 5/10, up to 9/10 with L>R LE radicular pain Goal status: IN PROGRESS  3.  Patient to demonstrate ability to achieve and maintain good spinal alignment/posturing and body mechanics needed for daily activities. Baseline: Flexed trunk posture with rounded shoulders and forward head Goal status:  IN PROGRESS  4.  Patient will demonstrate functional pain free lumbar ROM to perform ADLs.   Baseline: Refer to above lumbar ROM table Goal status: IN PROGRESS  5.  Patient will demonstrate improved B LE strength to >/= 4+/5 for improved stability and ease of mobility . Baseline: Refer to above MMT table Goal status: IN PROGRESS  6.  Patient will report 60 on lumbar FOTO to demonstrate improved functional ability.  Baseline: 52 Goal status: IN PROGRESS   7. Patient will report </= 24% on Modified Oswestry to demonstrate improved functional ability with decreased pain interference. Baseline: 18 / 50 = 36.0 %  Goal status: IN PROGRESS  8.  Patient will report ability to resume walking for wellness program at work without limitation due to LBP or L LE radiculopathy. Baseline: Patient has been unable to walk for wellness program due to pain Goal status: IN PROGRESS   PLAN:  PT FREQUENCY: 1-2x/week pending pt ability to afford copay  PT DURATION: 6-8 weeks  PLANNED INTERVENTIONS: Therapeutic exercises, Therapeutic activity, Neuromuscular re-education, Balance training, Gait training, Patient/Family education, Self Care, Joint  mobilization, Stair training, Aquatic Therapy, Dry Needling, Electrical stimulation, Spinal manipulation, Spinal mobilization, Cryotherapy, Moist heat, Taping, Traction, Ultrasound, Ionotophoresis 4mg /ml Dexamethasone, Manual therapy, and Re-evaluation  PLAN FOR NEXT SESSION: Assess response to DN; progress lumbopelvic flexibility and strengthening with extension preference;  MT +/- DN as indicated and benefit noted   Marry Guan, PT 04/10/2023, 5:18 PM

## 2023-04-12 ENCOUNTER — Inpatient Hospital Stay (HOSPITAL_BASED_OUTPATIENT_CLINIC_OR_DEPARTMENT_OTHER)
Admission: EM | Admit: 2023-04-12 | Discharge: 2023-04-15 | DRG: 064 | Disposition: A | Discharge status: Home / Self Care | Payer: PRIVATE HEALTH INSURANCE | Attending: Internal Medicine | Admitting: Internal Medicine

## 2023-04-12 ENCOUNTER — Other Ambulatory Visit: Payer: Self-pay

## 2023-04-12 ENCOUNTER — Emergency Department (HOSPITAL_BASED_OUTPATIENT_CLINIC_OR_DEPARTMENT_OTHER): Payer: PRIVATE HEALTH INSURANCE

## 2023-04-12 ENCOUNTER — Inpatient Hospital Stay (HOSPITAL_COMMUNITY): Payer: PRIVATE HEALTH INSURANCE

## 2023-04-12 ENCOUNTER — Encounter (HOSPITAL_BASED_OUTPATIENT_CLINIC_OR_DEPARTMENT_OTHER): Payer: Self-pay | Admitting: Emergency Medicine

## 2023-04-12 DIAGNOSIS — R29810 Facial weakness: Secondary | ICD-10-CM | POA: Diagnosis present

## 2023-04-12 DIAGNOSIS — G629 Polyneuropathy, unspecified: Secondary | ICD-10-CM

## 2023-04-12 DIAGNOSIS — I110 Hypertensive heart disease with heart failure: Secondary | ICD-10-CM | POA: Diagnosis present

## 2023-04-12 DIAGNOSIS — I6389 Other cerebral infarction: Secondary | ICD-10-CM | POA: Diagnosis not present

## 2023-04-12 DIAGNOSIS — E1142 Type 2 diabetes mellitus with diabetic polyneuropathy: Secondary | ICD-10-CM | POA: Diagnosis present

## 2023-04-12 DIAGNOSIS — I6502 Occlusion and stenosis of left vertebral artery: Secondary | ICD-10-CM | POA: Diagnosis present

## 2023-04-12 DIAGNOSIS — Z6833 Body mass index (BMI) 33.0-33.9, adult: Secondary | ICD-10-CM

## 2023-04-12 DIAGNOSIS — I2510 Atherosclerotic heart disease of native coronary artery without angina pectoris: Secondary | ICD-10-CM | POA: Diagnosis present

## 2023-04-12 DIAGNOSIS — E785 Hyperlipidemia, unspecified: Secondary | ICD-10-CM | POA: Diagnosis present

## 2023-04-12 DIAGNOSIS — I672 Cerebral atherosclerosis: Secondary | ICD-10-CM | POA: Diagnosis present

## 2023-04-12 DIAGNOSIS — R471 Dysarthria and anarthria: Secondary | ICD-10-CM | POA: Diagnosis present

## 2023-04-12 DIAGNOSIS — E876 Hypokalemia: Secondary | ICD-10-CM | POA: Diagnosis present

## 2023-04-12 DIAGNOSIS — E119 Type 2 diabetes mellitus without complications: Secondary | ICD-10-CM

## 2023-04-12 DIAGNOSIS — I619 Nontraumatic intracerebral hemorrhage, unspecified: Secondary | ICD-10-CM | POA: Diagnosis present

## 2023-04-12 DIAGNOSIS — Y900 Blood alcohol level of less than 20 mg/100 ml: Secondary | ICD-10-CM | POA: Diagnosis present

## 2023-04-12 DIAGNOSIS — Z951 Presence of aortocoronary bypass graft: Secondary | ICD-10-CM

## 2023-04-12 DIAGNOSIS — I639 Cerebral infarction, unspecified: Secondary | ICD-10-CM | POA: Insufficient documentation

## 2023-04-12 DIAGNOSIS — I10 Essential (primary) hypertension: Secondary | ICD-10-CM | POA: Diagnosis not present

## 2023-04-12 DIAGNOSIS — Z886 Allergy status to analgesic agent status: Secondary | ICD-10-CM

## 2023-04-12 DIAGNOSIS — T466X6A Underdosing of antihyperlipidemic and antiarteriosclerotic drugs, initial encounter: Secondary | ICD-10-CM | POA: Diagnosis present

## 2023-04-12 DIAGNOSIS — T45526A Underdosing of antithrombotic drugs, initial encounter: Secondary | ICD-10-CM | POA: Diagnosis present

## 2023-04-12 DIAGNOSIS — E7849 Other hyperlipidemia: Secondary | ICD-10-CM | POA: Diagnosis not present

## 2023-04-12 DIAGNOSIS — Z83438 Family history of other disorder of lipoprotein metabolism and other lipidemia: Secondary | ICD-10-CM

## 2023-04-12 DIAGNOSIS — Z794 Long term (current) use of insulin: Secondary | ICD-10-CM

## 2023-04-12 DIAGNOSIS — I509 Heart failure, unspecified: Secondary | ICD-10-CM | POA: Diagnosis present

## 2023-04-12 DIAGNOSIS — E1165 Type 2 diabetes mellitus with hyperglycemia: Secondary | ICD-10-CM | POA: Diagnosis present

## 2023-04-12 DIAGNOSIS — I6523 Occlusion and stenosis of bilateral carotid arteries: Secondary | ICD-10-CM | POA: Diagnosis present

## 2023-04-12 DIAGNOSIS — I63512 Cerebral infarction due to unspecified occlusion or stenosis of left middle cerebral artery: Secondary | ICD-10-CM | POA: Diagnosis present

## 2023-04-12 DIAGNOSIS — Z8679 Personal history of other diseases of the circulatory system: Secondary | ICD-10-CM

## 2023-04-12 DIAGNOSIS — G8191 Hemiplegia, unspecified affecting right dominant side: Secondary | ICD-10-CM | POA: Diagnosis present

## 2023-04-12 DIAGNOSIS — Z5971 Insufficient health insurance coverage: Secondary | ICD-10-CM | POA: Diagnosis not present

## 2023-04-12 DIAGNOSIS — F1090 Alcohol use, unspecified, uncomplicated: Secondary | ICD-10-CM

## 2023-04-12 DIAGNOSIS — Z8261 Family history of arthritis: Secondary | ICD-10-CM

## 2023-04-12 DIAGNOSIS — Z87891 Personal history of nicotine dependence: Secondary | ICD-10-CM

## 2023-04-12 DIAGNOSIS — G6289 Other specified polyneuropathies: Secondary | ICD-10-CM | POA: Diagnosis not present

## 2023-04-12 DIAGNOSIS — Z79899 Other long term (current) drug therapy: Secondary | ICD-10-CM

## 2023-04-12 DIAGNOSIS — Z8673 Personal history of transient ischemic attack (TIA), and cerebral infarction without residual deficits: Secondary | ICD-10-CM

## 2023-04-12 DIAGNOSIS — Z7984 Long term (current) use of oral hypoglycemic drugs: Secondary | ICD-10-CM

## 2023-04-12 DIAGNOSIS — Z87892 Personal history of anaphylaxis: Secondary | ICD-10-CM

## 2023-04-12 DIAGNOSIS — Z833 Family history of diabetes mellitus: Secondary | ICD-10-CM

## 2023-04-12 DIAGNOSIS — Z87442 Personal history of urinary calculi: Secondary | ICD-10-CM

## 2023-04-12 DIAGNOSIS — Z91199 Patient's noncompliance with other medical treatment and regimen due to unspecified reason: Secondary | ICD-10-CM

## 2023-04-12 DIAGNOSIS — Z91148 Patient's other noncompliance with medication regimen for other reason: Secondary | ICD-10-CM

## 2023-04-12 HISTORY — DX: Morbid (severe) obesity due to excess calories: E66.01

## 2023-04-12 HISTORY — DX: Cerebral infarction, unspecified: I63.9

## 2023-04-12 HISTORY — DX: Alcohol use, unspecified, uncomplicated: F10.90

## 2023-04-12 HISTORY — DX: Polyneuropathy, unspecified: G62.9

## 2023-04-12 HISTORY — DX: Personal history of other diseases of the circulatory system: Z86.79

## 2023-04-12 HISTORY — DX: Hypokalemia: E87.6

## 2023-04-12 LAB — URINALYSIS, MICROSCOPIC (REFLEX)

## 2023-04-12 LAB — CBC
HCT: 45.3 % (ref 39.0–52.0)
Hemoglobin: 15.4 g/dL (ref 13.0–17.0)
MCH: 29.6 pg (ref 26.0–34.0)
MCHC: 34 g/dL (ref 30.0–36.0)
MCV: 86.9 fL (ref 80.0–100.0)
Platelets: 161 10*3/uL (ref 150–400)
RBC: 5.21 MIL/uL (ref 4.22–5.81)
RDW: 13 % (ref 11.5–15.5)
WBC: 6.7 10*3/uL (ref 4.0–10.5)
nRBC: 0 % (ref 0.0–0.2)

## 2023-04-12 LAB — DIFFERENTIAL
Abs Immature Granulocytes: 0.01 10*3/uL (ref 0.00–0.07)
Basophils Absolute: 0.1 10*3/uL (ref 0.0–0.1)
Basophils Relative: 1 %
Eosinophils Absolute: 0.3 10*3/uL (ref 0.0–0.5)
Eosinophils Relative: 4 %
Immature Granulocytes: 0 %
Lymphocytes Relative: 36 %
Lymphs Abs: 2.4 10*3/uL (ref 0.7–4.0)
Monocytes Absolute: 0.6 10*3/uL (ref 0.1–1.0)
Monocytes Relative: 9 %
Neutro Abs: 3.3 10*3/uL (ref 1.7–7.7)
Neutrophils Relative %: 50 %

## 2023-04-12 LAB — COMPREHENSIVE METABOLIC PANEL
ALT: 56 U/L — ABNORMAL HIGH (ref 0–44)
AST: 44 U/L — ABNORMAL HIGH (ref 15–41)
Albumin: 4.1 g/dL (ref 3.5–5.0)
Alkaline Phosphatase: 72 U/L (ref 38–126)
Anion gap: 16 — ABNORMAL HIGH (ref 5–15)
BUN: 13 mg/dL (ref 8–23)
CO2: 23 mmol/L (ref 22–32)
Calcium: 9.1 mg/dL (ref 8.9–10.3)
Chloride: 98 mmol/L (ref 98–111)
Creatinine, Ser: 1.07 mg/dL (ref 0.61–1.24)
GFR, Estimated: >60 mL/min (ref >60)
Glucose, Bld: 241 mg/dL — ABNORMAL HIGH (ref 70–99)
Potassium: 3.1 mmol/L — ABNORMAL LOW (ref 3.5–5.1)
Sodium: 137 mmol/L (ref 135–145)
Total Bilirubin: 1.7 mg/dL — ABNORMAL HIGH (ref 0.3–1.2)
Total Protein: 8.2 g/dL — ABNORMAL HIGH (ref 6.5–8.1)

## 2023-04-12 LAB — RAPID URINE DRUG SCREEN, HOSP PERFORMED
Amphetamines: NOT DETECTED
Barbiturates: NOT DETECTED
Benzodiazepines: NOT DETECTED
Cocaine: NOT DETECTED
Opiates: NOT DETECTED
Tetrahydrocannabinol: NOT DETECTED

## 2023-04-12 LAB — URINALYSIS, ROUTINE W REFLEX MICROSCOPIC
Bilirubin Urine: NEGATIVE
Glucose, UA: 250 mg/dL — AB
Ketones, ur: 15 mg/dL — AB
Leukocytes,Ua: NEGATIVE
Nitrite: NEGATIVE
Protein, ur: NEGATIVE mg/dL
Specific Gravity, Urine: 1.01 (ref 1.005–1.030)
pH: 6 (ref 5.0–8.0)

## 2023-04-12 LAB — GLUCOSE, CAPILLARY: Glucose-Capillary: 231 mg/dL — ABNORMAL HIGH (ref 70–99)

## 2023-04-12 LAB — CBG MONITORING, ED: Glucose-Capillary: 235 mg/dL — ABNORMAL HIGH (ref 70–99)

## 2023-04-12 LAB — ETHANOL: Alcohol, Ethyl (B): <10 mg/dL (ref <10)

## 2023-04-12 MED ORDER — CLOPIDOGREL BISULFATE 300 MG PO TABS
300.0000 mg | ORAL_TABLET | ORAL | Status: AC
  Administered 2023-04-12: 300 mg via ORAL
  Filled 2023-04-12: qty 1

## 2023-04-12 MED ORDER — ROSUVASTATIN CALCIUM 20 MG PO TABS
40.0000 mg | ORAL_TABLET | Freq: Every day | ORAL | Status: DC
  Administered 2023-04-12 – 2023-04-15 (×4): 40 mg via ORAL
  Filled 2023-04-12 (×4): qty 2

## 2023-04-12 MED ORDER — ACETAMINOPHEN 325 MG PO TABS: 650.0000 mg | ORAL_TABLET | ORAL | Status: DC | PRN

## 2023-04-12 MED ORDER — HYDRALAZINE HCL 20 MG/ML IJ SOLN
10.0000 mg | Freq: Four times a day (QID) | INTRAMUSCULAR | Status: DC | PRN
  Administered 2023-04-13: 10 mg via INTRAVENOUS
  Filled 2023-04-12: qty 1

## 2023-04-12 MED ORDER — STROKE: EARLY STAGES OF RECOVERY BOOK
Freq: Once | Status: AC
  Filled 2023-04-12: qty 1

## 2023-04-12 MED ORDER — INSULIN GLARGINE-YFGN 100 UNIT/ML ~~LOC~~ SOPN
40.0000 [IU] | PEN_INJECTOR | Freq: Every day | SUBCUTANEOUS | Status: DC
  Filled 2023-04-12: qty 3

## 2023-04-12 MED ORDER — INSULIN ASPART 100 UNIT/ML IJ SOLN
0.0000 [IU] | Freq: Three times a day (TID) | INTRAMUSCULAR | Status: DC
  Administered 2023-04-13: 2 [IU] via SUBCUTANEOUS
  Administered 2023-04-13: 1 [IU] via SUBCUTANEOUS
  Administered 2023-04-13: 3 [IU] via SUBCUTANEOUS
  Administered 2023-04-14: 1 [IU] via SUBCUTANEOUS
  Administered 2023-04-14: 2 [IU] via SUBCUTANEOUS
  Administered 2023-04-15: 1 [IU] via SUBCUTANEOUS

## 2023-04-12 MED ORDER — GABAPENTIN 300 MG PO CAPS
300.0000 mg | ORAL_CAPSULE | Freq: Three times a day (TID) | ORAL | Status: DC
  Administered 2023-04-13 – 2023-04-15 (×8): 300 mg via ORAL
  Filled 2023-04-12 (×8): qty 1

## 2023-04-12 MED ORDER — EZETIMIBE 10 MG PO TABS
10.0000 mg | ORAL_TABLET | Freq: Every day | ORAL | Status: DC
  Administered 2023-04-12 – 2023-04-15 (×4): 10 mg via ORAL
  Filled 2023-04-12 (×4): qty 1

## 2023-04-12 MED ORDER — INSULIN ASPART 100 UNIT/ML IJ SOLN
0.0000 [IU] | Freq: Every day | INTRAMUSCULAR | Status: DC
  Administered 2023-04-12 – 2023-04-13 (×2): 2 [IU] via SUBCUTANEOUS

## 2023-04-12 MED ORDER — INSULIN GLARGINE-YFGN 100 UNIT/ML ~~LOC~~ SOLN
40.0000 [IU] | Freq: Every day | SUBCUTANEOUS | Status: DC
  Administered 2023-04-13 – 2023-04-15 (×3): 40 [IU] via SUBCUTANEOUS
  Filled 2023-04-12 (×3): qty 0.4

## 2023-04-12 MED ORDER — SENNOSIDES-DOCUSATE SODIUM 8.6-50 MG PO TABS
1.0000 | ORAL_TABLET | Freq: Every evening | ORAL | Status: DC | PRN
Start: 2023-04-12 — End: 2023-04-16

## 2023-04-12 MED ORDER — INSULIN GLARGINE-YFGN 100 UNIT/ML ~~LOC~~ SOPN
40.0000 [IU] | PEN_INJECTOR | Freq: Every day | SUBCUTANEOUS | Status: DC
Start: 2023-04-12 — End: 2023-04-12
  Filled 2023-04-12: qty 3
  Filled 2023-04-12: qty 0.4

## 2023-04-12 MED ORDER — TICAGRELOR 90 MG PO TABS
90.0000 mg | ORAL_TABLET | Freq: Two times a day (BID) | ORAL | Status: DC
  Administered 2023-04-12: 90 mg via ORAL
  Filled 2023-04-12: qty 1

## 2023-04-12 MED ORDER — ACETAMINOPHEN 650 MG RE SUPP: 650.0000 mg | RECTAL | Status: DC | PRN

## 2023-04-12 MED ORDER — POTASSIUM CHLORIDE 20 MEQ PO PACK
40.0000 meq | PACK | Freq: Once | ORAL | Status: AC
  Administered 2023-04-12: 40 meq via ORAL
  Filled 2023-04-12: qty 2

## 2023-04-12 MED ORDER — IOHEXOL 350 MG/ML SOLN
100.0000 mL | Freq: Once | INTRAVENOUS | Status: AC | PRN
  Administered 2023-04-12: 75 mL via INTRAVENOUS

## 2023-04-12 MED ORDER — LACTATED RINGERS IV BOLUS
1000.0000 mL | Freq: Once | INTRAVENOUS | Status: AC
  Administered 2023-04-12: 1000 mL via INTRAVENOUS

## 2023-04-12 MED ORDER — CLOPIDOGREL BISULFATE 75 MG PO TABS
75.0000 mg | ORAL_TABLET | Freq: Every day | ORAL | Status: DC
  Administered 2023-04-13: 75 mg via ORAL
  Filled 2023-04-12: qty 1

## 2023-04-12 MED ORDER — ACETAMINOPHEN 160 MG/5ML PO SOLN: 650.0000 mg | ORAL | Status: DC | PRN

## 2023-04-12 NOTE — ED Notes (Signed)
[facility] on site to transport patient to [facility] 

## 2023-04-12 NOTE — Consult Note (Incomplete)
NEUROLOGY CONSULT NOTE   Date of service: April 12, 2023 Patient Name: Dennis Hopkins MRN:  [id number] DOB:  [birth year] Chief Complaint: "R sided weakness" Requesting Provider: Rolly Salter, MD  History of Present Illness  Dennis Hopkins is a 62 y.o. male with hx of HTN, HLD, CAD s/p CABG, nephrolithiasis who presents with R sided weakness. Symptoms started around 2200 on 04/11/23. He was trying to go to sleep when he noticed then. Did not think too much about it at that time but in the AM, symptoms were persistent so he presented to ED where CT Head concerning for patchy L MCA strokes along with severe stenosis of L ICA cavernous segment.  He was transferred to [facility] for neurology and stroke evaluation. No prior hx of strokes.  LKW: 2200 on 04/11/23. Modified rankin score: 0-Completely asymptomatic and back to baseline post- stroke IV Thrombolysis: not offered, outside window EVT: not offered, no LVO   ROS  Comprehensive ROS performed and pertinent positives documented in HPI  Past History   Past Medical History:  Diagnosis Date  . Chickenpox   . Hyperlipemia   . Hypertension   . Kidney stones 2013    Past Surgical History:  Procedure Laterality Date  . CARDIAC CATHETERIZATION N/A 01/07/2016   Procedure: Left Heart Cath and Coronary Angiography;  Surgeon: Corky Crafts, MD;  Location: [facility] INVASIVE CV LAB;  Service: Cardiovascular;  Laterality: N/A;  . CORONARY ARTERY BYPASS GRAFT N/A 01/13/2016   Procedure: CORONARY ARTERY BYPASS GRAFTING (CABG) x4 Endoscopic Harvesting of the Right Greater Saphenous Vein;  Surgeon: Loreli Slot, MD;  Location: [facility] OR;  Service: Open Heart Surgery;  Laterality: N/A;  . TEE WITHOUT CARDIOVERSION N/A 01/13/2016   Procedure: TRANSESOPHAGEAL ECHOCARDIOGRAM (TEE);  Surgeon: Loreli Slot, MD;  Location: [facility] OR;  Service: Open Heart Surgery;  Laterality: N/A;  . TONSILLECTOMY      Family History: Family History  Problem  Relation Age of Onset  . Arthritis Mother   . Diabetes Mother   . Arthritis Father   . Hyperlipidemia Father   . Diabetes Father   . Diabetes Brother     Social History  reports that he quit smoking about 9 years ago. His smoking use included cigarettes. He started smoking about 44 years ago. He has a 70 pack-year smoking history. He has never used smokeless tobacco. He reports current alcohol use. He reports that he does not use drugs.  Allergies  Allergen Reactions  . Asa [Aspirin] Anaphylaxis, Swelling and Other (See Comments)    Lips swelling    Medications   Current Facility-Administered Medications:  .  [START ON 04/13/2023]  stroke: early stages of recovery book, , Does not apply, Once, Sundil, Subrina, MD .  acetaminophen (TYLENOL) tablet 650 mg, 650 mg, Oral, Q4H PRN **OR** acetaminophen (TYLENOL) 160 MG/5ML solution 650 mg, 650 mg, Per Tube, Q4H PRN **OR** acetaminophen (TYLENOL) suppository 650 mg, 650 mg, Rectal, Q4H PRN, [name], Subrina, MD .  Melene Muller ON 04/13/2023] clopidogrel (PLAVIX) tablet 75 mg, 75 mg, Oral, Daily, Sundil, Subrina, MD .  ezetimibe (ZETIA) tablet 10 mg, 10 mg, Oral, Daily, Sundil, Subrina, MD .  Melene Muller ON 04/13/2023] gabapentin (NEURONTIN) capsule 300 mg, 300 mg, Oral, TID, Sundil, Subrina, MD .  hydrALAZINE (APRESOLINE) injection 10 mg, 10 mg, Intravenous, Q6H PRN, Sundil, Subrina, MD .  insulin aspart (novoLOG) injection 0-5 Units, 0-5 Units, Subcutaneous, QHS, Sundil, Subrina, MD .  Melene Muller ON 04/13/2023] insulin aspart (novoLOG) injection 0-6  Units, 0-6 Units, Subcutaneous, TID WC, Sundil, Subrina, MD .  insulin glargine-yfgn (SEMGLEE) Pen 40 Units, 40 Units, Subcutaneous, Daily, Sundil, Subrina, MD .  potassium chloride (KLOR-CON) packet 40 mEq, 40 mEq, Oral, Once, Sundil, Subrina, MD .  rosuvastatin (CRESTOR) tablet 40 mg, 40 mg, Oral, Daily, Sundil, Subrina, MD .  senna-docusate (Senokot-S) tablet 1 tablet, 1 tablet, Oral, QHS PRN, [name],  Subrina, MD  Vitals   Vitals:   04/12/23 1601 04/12/23 1840 04/12/23 1953 04/12/23 2003  BP:  (!) 202/108  (!) 166/70  Pulse:    91  Resp:  18    Temp: 98.3 F (36.8 C) 98.2 F (36.8 C)  98.4 F (36.9 C)  TempSrc: Oral   Oral  SpO2:   97% 97%  Weight:  106.3 kg    Height:  5\' 10"  (1.778 m)      Body mass index is 33.63 kg/m.  Physical Exam   Constitutional: Appears well-developed and well-nourished.  Psych: Affect appropriate to situation.  Eyes: No scleral injection.  HENT: No OP obstruction.  Head: Normocephalic.  Cardiovascular: Normal rate and regular rhythm.  Respiratory: Effort normal, non-labored breathing.  GI: Soft.  No distension. There is no tenderness.  Skin: WDI.   Neurologic Examination  Mental status/Cognition: Alert, oriented to self, place, month and year, good attention. Speech/language: Fluent, comprehension intact, object naming intact, repetition intact.  Cranial nerves:   CN II Pupils equal and reactive to light, no VF deficits    CN III,IV,VI EOM intact, no gaze preference or deviation, no nystagmus    CN V normal sensation in V1, V2, and V3 segments bilaterally    CN VII R facial droop   CN VIII normal hearing to speech   CN IX & X normal palatal elevation, no uvular deviation    CN XI 5/5 head turn and 5/5 shoulder shrug bilaterally    CN XII midline tongue protrusion    Motor:  Muscle bulk: normal, tone normal, pronator drift noted in RUE. Mvmt Root Nerve  Muscle Right Left Comments  SA C5/6 Ax Deltoid 4 5   EF C5/6 Mc Biceps 4+ 5   EE C6/7/8 Rad Triceps 4+ 5   WF C6/7 Med FCR     WE C7/8 PIN ECU     F Ab C8/T1 U ADM/FDI 4 5   HF L1/2/3 Fem Illopsoas 4+ 5   KE L2/3/4 Fem Quad 5 5   DF L4/5 D Peron Tib Ant 5 5   PF S1/2 Tibial Grc/Sol 5 5    Sensation:  Light touch Slightly decreased in R leg to light touch   Pin prick    Temperature    Vibration   Proprioception    Coordination/Complex Motor:  - Finger to Nose intact BL -  Heel to shin intact BL - Rapid alternating movement are slowed on the right - Gait: deferred for patient safety.   Labs   CBC:  Recent Labs  Lab 04/12/23 1054  WBC 6.7  NEUTROABS 3.3  HGB 15.4  HCT 45.3  MCV 86.9  PLT 161    Basic Metabolic Panel:  Lab Results  Component Value Date   NA 137 04/12/2023   K 3.1 (L) 04/12/2023   CO2 23 04/12/2023   GLUCOSE 241 (H) 04/12/2023   BUN 13 04/12/2023   CREATININE 1.07 04/12/2023   CALCIUM 9.1 04/12/2023   GFRNONAA >60 04/12/2023   GFRAA >60 01/18/2016   Lipid Panel:  Lab Results  Component Value  Date   LDLCALC 128 (H) 02/17/2023   HgbA1c:  Lab Results  Component Value Date   HGBA1C 9.1 (H) 02/17/2023   Urine Drug Screen:     Component Value Date/Time   LABOPIA NONE DETECTED 04/12/2023 1054   COCAINSCRNUR NONE DETECTED 04/12/2023 1054   LABBENZ NONE DETECTED 04/12/2023 1054   AMPHETMU NONE DETECTED 04/12/2023 1054   THCU NONE DETECTED 04/12/2023 1054   LABBARB NONE DETECTED 04/12/2023 1054    Alcohol Level     Component Value Date/Time   ETH <10 04/12/2023 1054   INR  Lab Results  Component Value Date   INR 1.32 01/13/2016   APTT  Lab Results  Component Value Date   APTT 32 01/13/2016   AED levels: No results found for: "PHENYTOIN", "ZONISAMIDE", "LAMOTRIGINE", "LEVETIRACETA"  CT angio Head and Neck with contrast(Personally reviewed): Patchy L MCA territory hypodensities concerning for scattered acute stroke. Severe stenosis of L cavernous L ICA along with about 70% stenosis of proximal L ICA and 80% stenosis of the proximal R ICA.  MRI Brain(Personally reviewed): ***  rEEG:  ***  Impression   Marek GEANCARLO BERMUDES is a 62 y.o. male ***  Recommendations  *** ______________________________________________________________________    Welton Flakes

## 2023-04-12 NOTE — ED Notes (Signed)
[facility] called for consult with hospitalist, spoke with Greggory Stallion.

## 2023-04-12 NOTE — ED Notes (Signed)
ED TO INPATIENT HANDOFF REPORT  ED Nurse Name and Phone #: [facility] Paramedic [phone]  S Name/Age/Gender Dennis Hopkins 62 y.o. male Room/Bed: MH06/MH06  Code Status   Code Status: Prior  Home/SNF/Other Home Patient oriented to: self, place, time, and situation Is this baseline? Yes   Triage Complete: Triage complete  Chief Complaint Acute CVA (cerebrovascular accident) [facility]) [I63.9]  Triage Note Obvious facial droop , right arm weakness , all started at 10 pm last night . Pt alert and oriented x 4 , ambulatory . Hx HTN and 2 CABG    Allergies Allergies  Allergen Reactions   Asa [Aspirin] Anaphylaxis, Swelling and Other (See Comments)    Lips swelling    Level of Care/Admitting Diagnosis ED Disposition     ED Disposition  Admit   Condition  --   Comment  Hospital Area: [facility] [[id number]]  Level of Care: Telemetry Medical [104]  May admit patient to [facility] or [facility] if equivalent level of care is available:: No  Interfacility transfer: Yes  Covid Evaluation: Asymptomatic - no recent exposure (last 10 days) testing not required  Diagnosis: Acute CVA (cerebrovascular accident) [facility]) [[id number]]  Admitting Physician: Rolly Salter [[id number]]  Attending Physician: Rolly Salter [[id number]]  Certification:: I certify there are rare and unusual circumstances requiring inpatient admission  Expected Medical Readiness: 04/14/2023          B Medical/Surgery History Past Medical History:  Diagnosis Date   Chickenpox    Hyperlipemia    Hypertension    Kidney stones 2013   Past Surgical History:  Procedure Laterality Date   CARDIAC CATHETERIZATION N/A 01/07/2016   Procedure: Left Heart Cath and Coronary Angiography;  Surgeon: Corky Crafts, MD;  Location: [facility] INVASIVE CV LAB;  Service: Cardiovascular;  Laterality: N/A;   CORONARY ARTERY BYPASS GRAFT N/A 01/13/2016   Procedure: CORONARY ARTERY BYPASS GRAFTING (CABG)  x4 Endoscopic Harvesting of the Right Greater Saphenous Vein;  Surgeon: Loreli Slot, MD;  Location: [facility] OR;  Service: Open Heart Surgery;  Laterality: N/A;   TEE WITHOUT CARDIOVERSION N/A 01/13/2016   Procedure: TRANSESOPHAGEAL ECHOCARDIOGRAM (TEE);  Surgeon: Loreli Slot, MD;  Location: [facility] OR;  Service: Open Heart Surgery;  Laterality: N/A;   TONSILLECTOMY       A IV Location/Drains/Wounds Patient Lines/Drains/Airways Status     Active Line/Drains/Airways     Name Placement date Placement time Site Days   Peripheral IV 04/12/23 20 G 1.16" Anterior;Left;Proximal Forearm 04/12/23  1052  Forearm  less than 1            Intake/Output Last 24 hours  Intake/Output Summary (Last 24 hours) at 04/12/2023 1700 Last data filed at 04/12/2023 1410 Gross per 24 hour  Intake 999.13 ml  Output --  Net 999.13 ml    Labs/Imaging Results for orders placed or performed during the hospital encounter of 04/12/23 (from the past 48 hour(s))  CBG monitoring, ED     Status: Abnormal   Collection Time: 04/12/23 10:44 AM  Result Value Ref Range   Glucose-Capillary 235 (H) 70 - 99 mg/dL    Comment: Glucose reference range applies only to samples taken after fasting for at least 8 hours.  Ethanol     Status: None   Collection Time: 04/12/23 10:54 AM  Result Value Ref Range   Alcohol, Ethyl (B) <10 <10 mg/dL    Comment: (NOTE) Lowest detectable limit for serum alcohol is 10 mg/dL.  For medical purposes only. Performed at [facility], [address] [place], [place] [address]   CBC     Status: None   Collection Time: 04/12/23 10:54 AM  Result Value Ref Range   WBC 6.7 4.0 - 10.5 K/uL   RBC 5.21 4.22 - 5.81 MIL/uL   Hemoglobin 15.4 13.0 - 17.0 g/dL   HCT 60.4 54.0 - 98.1 %   MCV 86.9 80.0 - 100.0 fL   MCH 29.6 26.0 - 34.0 pg   MCHC 34.0 30.0 - 36.0 g/dL   RDW 19.1 47.8 - 29.5 %   Platelets 161 150 - 400 K/uL   nRBC 0.0 0.0 - 0.2 %    Comment: Performed at [facility], [address] [place], [place] [address]  Differential     Status: None   Collection Time: 04/12/23 10:54 AM  Result Value Ref Range   Neutrophils Relative % 50 %   Neutro Abs 3.3 1.7 - 7.7 K/uL   Lymphocytes Relative 36 %   Lymphs Abs 2.4 0.7 - 4.0 K/uL   Monocytes Relative 9 %   Monocytes Absolute 0.6 0.1 - 1.0 K/uL   Eosinophils Relative 4 %   Eosinophils Absolute 0.3 0.0 - 0.5 K/uL   Basophils Relative 1 %   Basophils Absolute 0.1 0.0 - 0.1 K/uL   Immature Granulocytes 0 %   Abs Immature Granulocytes 0.01 0.00 - 0.07 K/uL    Comment: Performed at [facility], [address] [place], [place] [address]  Comprehensive metabolic panel     Status: Abnormal   Collection Time: 04/12/23 10:54 AM  Result Value Ref Range   Sodium 137 135 - 145 mmol/L   Potassium 3.1 (L) 3.5 - 5.1 mmol/L   Chloride 98 98 - 111 mmol/L   CO2 23 22 - 32 mmol/L   Glucose, Bld 241 (H) 70 - 99 mg/dL    Comment: Glucose reference range applies only to samples taken after fasting for at least 8 hours.   BUN 13 8 - 23 mg/dL   Creatinine, Ser 4.69 0.61 - 1.24 mg/dL   Calcium 9.1 8.9 - 62.9 mg/dL   Total Protein 8.2 (H) 6.5 - 8.1 g/dL   Albumin 4.1 3.5 - 5.0 g/dL   AST 44 (H) 15 - 41 U/L   ALT 56 (H) 0 - 44 U/L   Alkaline Phosphatase 72 38 - 126 U/L   Total Bilirubin 1.7 (H) 0.3 - 1.2 mg/dL   GFR, Estimated >52 >84 mL/min    Comment: (NOTE) Calculated using the CKD-EPI Creatinine Equation (2021)    Anion gap 16 (H) 5 - 15    Comment: Performed at [facility], [address] [place], [place] [address]  Urine rapid drug screen (hosp performed)     Status: None   Collection Time: 04/12/23 10:54 AM  Result Value Ref Range   Opiates NONE DETECTED NONE DETECTED   Cocaine NONE DETECTED NONE DETECTED   Benzodiazepines NONE DETECTED NONE DETECTED   Amphetamines NONE DETECTED NONE DETECTED   Tetrahydrocannabinol NONE DETECTED NONE DETECTED   Barbiturates NONE  DETECTED NONE DETECTED    Comment: (NOTE) DRUG SCREEN FOR MEDICAL PURPOSES ONLY.  IF CONFIRMATION IS NEEDED FOR ANY PURPOSE, NOTIFY LAB WITHIN 5 DAYS.  LOWEST DETECTABLE LIMITS FOR URINE DRUG SCREEN Drug Class  Cutoff (ng/mL) Amphetamine and metabolites    1000 Barbiturate and metabolites    200 Benzodiazepine                 200 Opiates and metabolites        300 Cocaine and metabolites        300 THC                            50 Performed at [facility], [address] [place], [place] [address]   Urinalysis, Routine w reflex microscopic -Urine, Clean Catch     Status: Abnormal   Collection Time: 04/12/23 10:54 AM  Result Value Ref Range   Color, Urine YELLOW YELLOW   APPearance CLEAR CLEAR   Specific Gravity, Urine 1.010 1.005 - 1.030   pH 6.0 5.0 - 8.0   Glucose, UA 250 (A) NEGATIVE mg/dL   Hgb urine dipstick LARGE (A) NEGATIVE   Bilirubin Urine NEGATIVE NEGATIVE   Ketones, ur 15 (A) NEGATIVE mg/dL   Protein, ur NEGATIVE NEGATIVE mg/dL   Nitrite NEGATIVE NEGATIVE   Leukocytes,Ua NEGATIVE NEGATIVE    Comment: Performed at [facility], [address] [place], [place] [address]  Urinalysis, Microscopic (reflex)     Status: Abnormal   Collection Time: 04/12/23 10:54 AM  Result Value Ref Range   RBC / HPF 11-20 0 - 5 RBC/hpf   WBC, UA 0-5 0 - 5 WBC/hpf   Bacteria, UA RARE (A) NONE SEEN   Squamous Epithelial / HPF 0-5 0 - 5 /HPF    Comment: Performed at [facility], [address] [place], [place] [address]   CT ANGIO HEAD NECK W WO CM  Result Date: 04/12/2023 CLINICAL DATA:  RUE weakness/numb, facial droop. EXAM: CT ANGIOGRAPHY HEAD AND NECK WITH AND WITHOUT CONTRAST TECHNIQUE: Multidetector CT imaging of the head and neck was performed using the standard protocol during bolus administration of intravenous contrast. Multiplanar CT image reconstructions and MIPs were obtained to evaluate the vascular anatomy.  Carotid stenosis measurements (when applicable) are obtained utilizing NASCET criteria, using the distal internal carotid diameter as the denominator. RADIATION DOSE REDUCTION: This exam was performed according to the departmental dose-optimization program which includes automated exposure control, adjustment of the mA and/or kV according to patient size and/or use of iterative reconstruction technique. CONTRAST:  75mL OMNIPAQUE IOHEXOL 350 MG/ML SOLN COMPARISON:  None Available. FINDINGS: CT HEAD FINDINGS Brain: No acute hemorrhage. Patchy loss of gray-white differentiation in the high left frontal and parietal lobes, suspicious for acute infarcts. Old infarct in the right middle frontal gyrus. No hydrocephalus or extra-axial collection. No mass effect or midline shift. Vascular: No hyperdense vessel or unexpected calcification. Skull: No calvarial fracture or suspicious bone lesion. Skull base is unremarkable. Sinuses/Orbits: Near-complete opacification of the left sphenoid sinus. Other: None. Review of the MIP images confirms the above findings CTA NECK FINDINGS Aortic arch: Three-vessel arch configuration. Ulcerated atherosclerotic plaque along the aortic arch. Postoperative changes from prior median sternotomy and CABG. Arch vessel origins are patent. Right carotid system: At least 80% stenosis of the proximal right cervical ICA. Precise measurement is limited by blooming artifact from adjacent calcified plaque. Left carotid system: Approximately 70% stenosis of the proximal left cervical ICA. Vertebral arteries: Right dominant. The non dominant left vertebral artery is occluded at its origin with reconstitution of the distal left V2 segment, likely via retrograde flow  from the basilar artery. Skeleton: Multilevel cervical spondylosis, worst at C4-5, where there is at least mild spinal canal stenosis. Other neck: Unremarkable. Upper chest: Unremarkable. 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 Moderate to severe stenosis of the left ICA communicating segment, just proximal to the origin of the left posterior communicating artery. Moderate to severe stenosis of the right ICA supraclinoid segment. The proximal ACAs and MCAs are patent without stenosis or aneurysm. Distal branches are symmetric. Posterior circulation: Normal basilar artery. The SCAs, AICAs and PICAs are patent proximally. The PCAs are patent proximally without stenosis or aneurysm. Distal branches are symmetric. Venous sinuses: As permitted by contrast timing, patent. Anatomic variants: None. Review of the MIP images confirms the above findings IMPRESSION: 1. Patchy loss of gray-white differentiation in the high left frontal and parietal lobes, suspicious for acute left MCA territory infarcts. No acute hemorrhage. 2. No large vessel occlusion. 3. 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 Moderate to severe stenosis of the left ICA communicating segment, just proximal to the origin of the left posterior communicating artery. 4. Moderate to severe stenosis of the right ICA supraclinoid segment. 5. At least 80% stenosis of the proximal right cervical ICA. Approximately 70% stenosis of the proximal left cervical ICA. 6. 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 Aortic Atherosclerosis (ICD10-I70.0). Electronically Signed   By: Orvan Falconer M.D.   On: 04/12/2023 12:39    Pending Labs Unresulted Labs (From admission, onward)    None       Vitals/Pain Today's Vitals   04/12/23 1105 04/12/23 1206 04/12/23 1430 04/12/23 1601  BP:   (!) 189/97   Pulse:   93   Resp:   (!) 25   Temp: 98.3 F (36.8 C)   98.3 F (36.8 C)  TempSrc: Oral   Oral  SpO2:   97%   Weight:      PainSc:  0-No pain      Isolation  Precautions No active isolations  Medications Medications  iohexol (OMNIPAQUE) 350 MG/ML injection 100 mL (75 mLs Intravenous Contrast Given 04/12/23 1126)  lactated ringers bolus 1,000 mL ( Intravenous Stopped 04/12/23 1406)  clopidogrel (PLAVIX) tablet 300 mg (300 mg Oral Given 04/12/23 1319)    Mobility walks     Focused Assessments Neuro Assessment Handoff:  Swallow screen pass? Yes    NIH Stroke Scale  Dizziness Present: Yes Headache Present: No Interval: Initial Level of Consciousness (1a.)   : Alert, keenly responsive LOC Questions (1b. )   : Answers both questions correctly LOC Commands (1c. )   : Performs both tasks correctly Best Gaze (2. )  : Partial gaze palsy Visual (3. )  : No visual loss Facial Palsy (4. )    : Partial paralysis  Motor Arm, Left (5a. )   : No drift Motor Arm, Right (5b. ) : No drift Motor Leg, Left (6a. )  : No drift Motor Leg, Right (6b. ) : No drift Limb Ataxia (7. ): Absent Sensory (8. )  : Severe to total sensory loss, patient is not aware of being touched in the face, arm, and leg Best Language (9. )  : No aphasia Dysarthria (10. ): Mild-to-moderate dysarthria, patient slurs at least some words and, at worst, can be understood with  some difficulty Extinction/Inattention (11.)   : No Abnormality Complete NIHSS TOTAL: 6 Last date known well: 04/11/23 Last time known well: 2200 Neuro Assessment: Exceptions to WDL Neuro Checks:   Initial (04/12/23 1055)  Has TPA been given? No If patient is a Neuro Trauma and patient is going to OR before floor call report to 4N Charge nurse: [phone] or [phone]   R Recommendations: See Admitting Provider Note  Report given to:   Additional Notes:

## 2023-04-12 NOTE — ED Triage Notes (Signed)
Obvious facial droop , right arm weakness , all started at 10 pm last night . Pt alert and oriented x 4 , ambulatory . Hx HTN and 2 CABG

## 2023-04-12 NOTE — ED Notes (Signed)
Pt advised he does not take his ASA or Plavix. Denies intentionally avoiding taking it, just advises he doesn't take it ever. No recent trauma, falls, MVC, head injury, LOC. No dizziness at present. Chronic back pain.   CAOx4 and advised he went to bed at 2200hrs with only c/o fatigue. Woke up with weakness on the R side, aphasia, and neglect. NIH as shown.

## 2023-04-12 NOTE — ED Notes (Signed)
Patient ambulatory to [facility] for UA

## 2023-04-12 NOTE — ED Notes (Signed)
Pt does not need to urinate and does not want to try.

## 2023-04-12 NOTE — Consult Note (Signed)
NEUROLOGY CONSULT NOTE   Date of service: April 12, 2023 Patient Name: CLEMENTE HANDLEY MRN:  [id number] DOB:  12-06-1960 Chief Complaint: "R sided weakness" Requesting Provider: Rolly Salter, MD  History of Present Illness  ZARREN PENFOLD is a 61 y.o. male with hx of HTN, HLD, CAD s/p CABG, nephrolithiasis who presents with R sided weakness. Symptoms started around 2200 on 04/11/23. Symptoms were persistent so he presented to ED where CT Head concerning for patchy L MCA strokes along with severe stenosis of L ICA cavernous segment.  He was transferred     LKW: *** Modified rankin score: {Modified Rankin Scale:21264} IV Thrombolysis: ***Yes, *** No (reason) EVT: ***Yes, *** No (reason)   ROS  ***Comprehensive ROS performed and pertinent positives documented in HPI ***Unable to ascertain due to ***  Past History   Past Medical History:  Diagnosis Date   Chickenpox    Hyperlipemia    Hypertension    Kidney stones 2013    Past Surgical History:  Procedure Laterality Date   CARDIAC CATHETERIZATION N/A 01/07/2016   Procedure: Left Heart Cath and Coronary Angiography;  Surgeon: Corky Crafts, MD;  Location[facility] INVASIVE CV LAB;  Service: Cardiovascular;  Laterality: N/A;   CORONARY ARTERY BYPASS GRAFT N/A 01/13/2016   Procedure: CORONARY ARTERY BYPASS GRAFTING (CABG) x4 Endoscopic Harvesting of the Right Greater Saphenous Vein;  Surgeon: Loreli Slot, MD;  Location: [facility] OR;  Service: Open Heart Surgery;  Laterality: N/A;   TEE WITHOUT CARDIOVERSION N/A 01/13/2016   Procedure: TRANSESOPHAGEAL ECHOCARDIOGRAM (TEE);  Surgeon: Loreli Slot, MD;  Location: [facility] OR;  Service: Open Heart Surgery;  Laterality: N/A;   TONSILLECTOMY      Family History: Family History  Problem Relation Age of Onset   Arthritis Mother    Diabetes Mother    Arthritis Father    Hyperlipidemia Father    Diabetes Father    Diabetes Brother     Social History  reports that he  quit smoking about 9 years ago. His smoking use included cigarettes. He started smoking about 44 years ago. He has a 70 pack-year smoking history. He has never used smokeless tobacco. He reports current alcohol use. He reports that he does not use drugs.  Allergies  Allergen Reactions   Asa [Aspirin] Anaphylaxis, Swelling and Other (See Comments)    Lips swelling    Medications   Current Facility-Administered Medications:    [START ON 04/13/2023]  stroke: early stages of recovery book, , Does not apply, Once, Sundil, Subrina, MD   acetaminophen (TYLENOL) tablet 650 mg, 650 mg, Oral, Q4H PRN **OR** acetaminophen (TYLENOL) 160 MG/5ML solution 650 mg, 650 mg, Per Tube, Q4H PRN **OR** acetaminophen (TYLENOL) suppository 650 mg, 650 mg, Rectal, Q4H PRN, [name], Subrina, MD   Melene Muller ON 04/13/2023] clopidogrel (PLAVIX) tablet 75 mg, 75 mg, Oral, Daily, Sundil, Subrina, MD   ezetimibe (ZETIA) tablet 10 mg, 10 mg, Oral, Daily, Sundil, Subrina, MD   Melene Muller ON 04/13/2023] gabapentin (NEURONTIN) capsule 300 mg, 300 mg, Oral, TID, Sundil, Subrina, MD   hydrALAZINE (APRESOLINE) injection 10 mg, 10 mg, Intravenous, Q6H PRN, Sundil, Subrina, MD   insulin aspart (novoLOG) injection 0-5 Units, 0-5 Units, Subcutaneous, QHS, Sundil, Subrina, MD   Melene Muller ON 04/13/2023] insulin aspart (novoLOG) injection 0-6 Units, 0-6 Units, Subcutaneous, TID WC, Sundil, Subrina, MD   insulin glargine-yfgn (SEMGLEE) Pen 40 Units, 40 Units, Subcutaneous, Daily, Sundil, Subrina, MD   potassium chloride (KLOR-CON) packet 40 mEq, 40 mEq, Oral,  Once, Sundil, Subrina, MD   rosuvastatin (CRESTOR) tablet 40 mg, 40 mg, Oral, Daily, Sundil, Subrina, MD   senna-docusate (Senokot-S) tablet 1 tablet, 1 tablet, Oral, QHS PRN, Sundil, Subrina, MD  Vitals   Vitals:   04/12/23 1601 04/12/23 1840 04/12/23 1953 04/12/23 2003  BP:  (!) 202/108  (!) 166/70  Pulse:    91  Resp:  18    Temp: 98.3 F (36.8 C) 98.2 F (36.8 C)  98.4 F (36.9 C)   TempSrc: Oral   Oral  SpO2:   97% 97%  Weight:  106.3 kg    Height:  5\' 10"  (1.778 m)      Body mass index is 33.63 kg/m.  Physical Exam   Constitutional: Appears well-developed and well-nourished. *** Psych: Affect appropriate to situation. *** Eyes: No scleral injection. *** HENT: No OP obstruction. *** Head: Normocephalic. *** Cardiovascular: Normal rate and regular rhythm. *** Respiratory: Effort normal, non-labored breathing. *** GI: Soft.  No distension. There is no tenderness. *** Skin: WDI. ***  Neurologic Examination   ***  Labs   CBC:  Recent Labs  Lab 04/12/23 1054  WBC 6.7  NEUTROABS 3.3  HGB 15.4  HCT 45.3  MCV 86.9  PLT 161    Basic Metabolic Panel:  Lab Results  Component Value Date   NA 137 04/12/2023   K 3.1 (L) 04/12/2023   CO2 23 04/12/2023   GLUCOSE 241 (H) 04/12/2023   BUN 13 04/12/2023   CREATININE 1.07 04/12/2023   CALCIUM 9.1 04/12/2023   GFRNONAA >60 04/12/2023   GFRAA >60 01/18/2016   Lipid Panel:  Lab Results  Component Value Date   LDLCALC 128 (H) 02/17/2023   HgbA1c:  Lab Results  Component Value Date   HGBA1C 9.1 (H) 02/17/2023   Urine Drug Screen:     Component Value Date/Time   LABOPIA NONE DETECTED 04/12/2023 1054   COCAINSCRNUR NONE DETECTED 04/12/2023 1054   LABBENZ NONE DETECTED 04/12/2023 1054   AMPHETMU NONE DETECTED 04/12/2023 1054   THCU NONE DETECTED 04/12/2023 1054   LABBARB NONE DETECTED 04/12/2023 1054    Alcohol Level     Component Value Date/Time   ETH <10 04/12/2023 1054   INR  Lab Results  Component Value Date   INR 1.32 01/13/2016   APTT  Lab Results  Component Value Date   APTT 32 01/13/2016   AED levels: No results found for: "PHENYTOIN", "ZONISAMIDE", "LAMOTRIGINE", "LEVETIRACETA"   CT Head without contrast(Personally reviewed): ***  CT angio Head and Neck with contrast(Personally reviewed): ***  MR Angio head without contrast and Carotid Duplex BL(Personally  reviewed): ***  MRI Brain(Personally reviewed): ***  rEEG:  ***  Impression   Ara ARTHOR STELMA is a 62 y.o. male ***  Recommendations  *** ______________________________________________________________________    Welton Flakes

## 2023-04-12 NOTE — ED Provider Notes (Signed)
[place] [facility] AT [facility] [place] Provider Note   CSN: [id number] Arrival date & time: 04/12/23  1038     History  Chief Complaint  Patient presents with  . Facial Droop    Dennis Hopkins is a 62 y.o. male.  62 year old male with a history of CABG, hypertension, hyperlipidemia, and diabetes who presents emergency department with facial droop and right upper extremity weakness.  Went to bed at 10 PM last night feeling normal with no neurologic deficits.  Woke up this morning and noticed that he had droop of the right side of his face and with his right arm.  Also reports numbness of his right face and arm.  No headache.  Not on any blood thinners.  No longer takes Plavix.       Home Medications Prior to Admission medications   Medication Sig Start Date End Date Taking? Authorizing Provider  augmented betamethasone dipropionate (DIPROLENE AF) 0.05 % cream Apply topically 2 (two) times daily. 08/24/20   Donato Schultz, DO  ciclopirox (PENLAC) 8 % solution Apply topically at bedtime. Apply over nail and surrounding skin. Apply daily over previous coat. After seven (7) days, may remove with alcohol and continue cycle. 11/16/21   Vivi Barrack, DPM  clopidogrel (PLAVIX) 75 MG tablet Take 1 tablet (75 mg total) by mouth daily. 11/11/21   Seabron Spates R, DO  clotrimazole-betamethasone (LOTRISONE) cream APPLY  CREAM TOPICALLY TWICE DAILY 04/09/21   Zola Button, Grayling Congress, DO  clotrimazole-betamethasone (LOTRISONE) cream Apply 1 Application topically daily. 02/17/23   Donato Schultz, DO  Continuous Glucose Receiver (DEXCOM G7 RECEIVER) DEVI As directed 02/24/23   Zola Button, Grayling Congress, DO  Continuous Glucose Sensor (DEXCOM G7 SENSOR) MISC Use to check blood glucose at least 4 times daily. Change sensor every 10 days. 02/24/23   Donato Schultz, DO  dapagliflozin propanediol (FARXIGA) 10 MG TABS tablet Take 1 tablet (10 mg total) by mouth daily  before breakfast. 11/09/21   Zola Button, Grayling Congress, DO  ezetimibe (ZETIA) 10 MG tablet Take 1 tablet (10 mg total) by mouth daily. 09/15/22   Donato Schultz, DO  furosemide (LASIX) 20 MG tablet Take 1 tablet (20 mg total) by mouth daily. 12/06/21 08/19/22  Donato Schultz, DO  gabapentin (NEURONTIN) 100 MG capsule TAKE 1 TO 3 CAPSULES BY MOUTH THREE TIMES DAILY 03/17/23   Zola Button, Grayling Congress, DO  HUMALOG KWIKPEN 100 UNIT/ML KwikPen INJECT 8 TO 18 UNITS SUBCUTANEOUSLY THREE TIMES DAILY 08/24/22   Zola Button, Grayling Congress, DO  insulin glargine-yfgn (SEMGLEE) 100 UNIT/ML Pen Inject 40 Units into the skin daily. 07/06/22   Donato Schultz, DO  Insulin Pen Needle 32G X 4 MM MISC 1 Device by Does not apply route in the morning, at noon, in the evening, and at bedtime. 04/13/21   Shamleffer, Konrad Dolores, MD  ketoconazole (NIZORAL) 2 % cream Apply 1 application. topically daily. 11/16/21   Vivi Barrack, DPM  lidocaine (LIDODERM) 5 % Place 1 patch onto the skin daily. Remove & Discard patch within 12 hours or as directed by MD    [provider]  metoprolol (TOPROL-XL) 200 MG 24 hr tablet Take 1 tablet (200 mg total) by mouth daily. 09/08/22   Donato Schultz, DO  rosuvastatin (CRESTOR) 40 MG tablet Take 1 tablet (40 mg total) by mouth daily. 07/04/22   Donato Schultz, DO  tiZANidine (ZANAFLEX) 4 MG tablet Take 1 tablet (4 mg total) by mouth every 8 (eight) hours as needed for muscle spasms. 02/09/23   Donato Schultz, DO  traMADol (ULTRAM) 50 MG tablet Take 1 tablet (50 mg total) by mouth every 6 (six) hours as needed for moderate pain. 02/17/23   Seabron Spates R, DO  triamcinolone cream (KENALOG) 0.1 % Apply 1 Application topically 2 (two) times daily. 04/12/22   Donato Schultz, DO      Allergies    Asa [aspirin]    Review of Systems   Review of Systems  Physical Exam Updated Vital Signs BP (!) 152/96   Pulse (!) 102   Temp 98.3 F (36.8 C)  (Oral)   Resp (!) 22   Wt 106.6 kg   SpO2 96%   BMI 32.78 kg/m  Physical Exam Vitals and nursing note reviewed.  Constitutional:      General: He is not in acute distress.    Appearance: He is well-developed.  HENT:     Head: Normocephalic and atraumatic.     Right Ear: External ear normal.     Left Ear: External ear normal.     Nose: Nose normal.  Eyes:     Extraocular Movements: Extraocular movements intact.     Conjunctiva/sclera: Conjunctivae normal.     Pupils: Pupils are equal, round, and reactive to light.  Pulmonary:     Effort: Pulmonary effort is normal. No respiratory distress.  Musculoskeletal:     Cervical back: Normal range of motion and neck supple.     Right lower leg: No edema.     Left lower leg: No edema.  Skin:    General: Skin is warm and dry.  Neurological:     Mental Status: He is alert and oriented to person, place, and time.     Comments: NIHSS Exam  Level of Consciousness: Alert  LOC Questions: Answers Month and Age Correctly  LOC Commands: Opens and Closes Eyes and Hands on command  Best Gaze: Horizontal ocular movements intact  Visual Fields: No visual field loss Facial Palsy: Right facial hemiparesis involving the forehead L Upper Extremity Motor: No drift after 10 seconds  R Upper Extremity Motor: No drift after 10 seconds  L Lower extremity Motor: No drift after 5 seconds  R Lower extremity Motor: No drift after 5 seconds  Ataxia: Absent  Sensory: Diminished sensation to light touch in right face and right upper extremity Best Language: No aphasia  Dysarthria: Dysarthria present Neglect: No visual or sensory neglect    Psychiatric:        Mood and Affect: Mood normal.        Behavior: Behavior normal.     ED Results / Procedures / Treatments   Labs (all labs ordered are listed, but only abnormal results are displayed) Labs Reviewed  COMPREHENSIVE METABOLIC PANEL - Abnormal; Notable for the following components:      Result Value    Potassium 3.1 (*)    Glucose, Bld 241 (*)    Total Protein 8.2 (*)    AST 44 (*)    ALT 56 (*)    Total Bilirubin 1.7 (*)    Anion gap 16 (*)    All other components within normal limits  CBG MONITORING, ED - Abnormal; Notable for the following components:   Glucose-Capillary 235 (*)    All other components within normal limits  ETHANOL  CBC  DIFFERENTIAL  RAPID URINE  DRUG SCREEN, HOSP PERFORMED  URINALYSIS, ROUTINE W REFLEX MICROSCOPIC    EKG EKG Interpretation Date/Time:  Wednesday April 12 2023 10:46:14 EDT Ventricular Rate:  110 PR Interval:  178 QRS Duration:  98 QT Interval:  348 QTC Calculation: 471 R Axis:   29  Text Interpretation: Sinus tachycardia Consider anterior infarct Repol abnrm suggests ischemia, lateral leads Confirmed by Vonita Moss [phone]) on 04/12/2023 10:56:03 AM  Radiology CT ANGIO HEAD NECK W WO CM  Result Date: 04/12/2023 CLINICAL DATA:  RUE weakness/numb, facial droop. EXAM: CT ANGIOGRAPHY HEAD AND NECK WITH AND WITHOUT CONTRAST TECHNIQUE: Multidetector CT imaging of the head and neck was performed using the standard protocol during bolus administration of intravenous contrast. Multiplanar CT image reconstructions and MIPs were obtained to evaluate the vascular anatomy. Carotid stenosis measurements (when applicable) are obtained utilizing NASCET criteria, using the distal internal carotid diameter as the denominator. RADIATION DOSE REDUCTION: This exam was performed according to the departmental dose-optimization program which includes automated exposure control, adjustment of the mA and/or kV according to patient size and/or use of iterative reconstruction technique. CONTRAST:  75mL OMNIPAQUE IOHEXOL 350 MG/ML SOLN COMPARISON:  None Available. FINDINGS: CT HEAD FINDINGS Brain: No acute hemorrhage. Patchy loss of gray-white differentiation in the high left frontal and parietal lobes, suspicious for acute infarcts. Old infarct in the right middle  frontal gyrus. No hydrocephalus or extra-axial collection. No mass effect or midline shift. Vascular: No hyperdense vessel or unexpected calcification. Skull: No calvarial fracture or suspicious bone lesion. Skull base is unremarkable. Sinuses/Orbits: Near-complete opacification of the left sphenoid sinus. Other: None. Review of the MIP images confirms the above findings CTA NECK FINDINGS Aortic arch: Three-vessel arch configuration. Ulcerated atherosclerotic plaque along the aortic arch. Postoperative changes from prior median sternotomy and CABG. Arch vessel origins are patent. Right carotid system: At least 80% stenosis of the proximal right cervical ICA. Precise measurement is limited by blooming artifact from adjacent calcified plaque. Left carotid system: Approximately 70% stenosis of the proximal left cervical ICA. Vertebral arteries: Right dominant. The non dominant left vertebral artery is occluded at its origin with reconstitution of the distal left V2 segment, likely via retrograde flow from the basilar artery. Skeleton: Multilevel cervical spondylosis, worst at C4-5, where there is at least mild spinal canal stenosis. Other neck: Unremarkable. Upper chest: Unremarkable. 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 Moderate to severe stenosis of the left ICA communicating segment, just proximal to the origin of the left posterior communicating artery. Moderate to severe stenosis of the right ICA supraclinoid segment. The proximal ACAs and MCAs are patent without stenosis or aneurysm. Distal branches are symmetric. Posterior circulation: Normal basilar artery. The SCAs, AICAs and PICAs are patent proximally. The PCAs are patent proximally without stenosis or aneurysm. Distal branches are symmetric. Venous sinuses: As permitted by contrast timing, patent. Anatomic variants: None. Review of the MIP images  confirms the above findings IMPRESSION: 1. Patchy loss of gray-white differentiation in the high left frontal and parietal lobes, suspicious for acute left MCA territory infarcts. No acute hemorrhage. 2. No large vessel occlusion. 3. 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 Moderate to severe stenosis of the left ICA communicating segment, just proximal to the origin of the left posterior communicating artery. 4. Moderate to severe stenosis of the right ICA supraclinoid segment. 5. At least 80% stenosis of the proximal right cervical ICA. Approximately 70% stenosis  of the proximal left cervical ICA. 6. 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 Aortic Atherosclerosis (ICD10-I70.0). Electronically Signed   By: Orvan Falconer M.D.   On: 04/12/2023 12:39    Procedures Procedures    Medications Ordered in ED Medications  iohexol (OMNIPAQUE) 350 MG/ML injection 100 mL (75 mLs Intravenous Contrast Given 04/12/23 1126)  lactated ringers bolus 1,000 mL (1,000 mLs Intravenous New Bag/Given 04/12/23 1305)  clopidogrel (PLAVIX) tablet 300 mg (300 mg Oral Given 04/12/23 1319)    ED Course/ Medical Decision Making/ A&P Clinical Course as of 04/12/23 1403  Wed Apr 12, 2023  1110 Dr Selina Cooley from neurology consulted.  Recommends aspirin and Plavix and admission to [facility]. [RP]  6160 Dr Allena Katz [RP]    Clinical Course User Index [RP] Rondel Baton, MD                                 Medical Decision Making Amount and/or Complexity of Data Reviewed Labs: ordered. Radiology: ordered.  Risk Prescription drug management. Decision regarding hospitalization.   Dennis Hopkins is a 62 y.o. male with comorbidities that complicate the patient evaluation including CABG, hypertension, hyperlipidemia, and diabetes who presents emergency department with facial droop and  right upper extremity weakness.    Initial Ddx:  Stroke, ICH, hypoglycemia, Bell's palsy  MDM/Course:  Patient presents emergency department with right-sided facial droop and right upper extremity weakness and numbness.  Also has some dysarthria.  Last known well was unfortunately at 10 PM last night so is outside of the window for TNK.  Does not have any visual cut, aphasia, or neglect at this time so is also not a candidate for stroke activation for thrombectomy currently.  CTA did not show any evidence of LVO but did show gray-white differentiation loss in MCA territory as well as severe stenosis of the left ICA.  Discussed with neurology who felt that he should be admitted to [facility] for stroke workup.  Upon re-evaluation patient remained stable.  Given Plavix only since he has an anaphylactic reaction to aspirin.  Discussed with hospitalist for admission.  This patient presents to the ED for concern of complaints listed in HPI, this involves an extensive number of treatment options, and is a complaint that carries with it a high risk of complications and morbidity. Disposition including potential need for admission considered.   Dispo: Admit to Floor  Records reviewed Outpatient Clinic Notes The following labs were independently interpreted: Chemistry and show no acute abnormality I independently reviewed the following imaging with scope of interpretation limited to determining acute life threatening conditions related to emergency care: CT Head and agree with the radiologist interpretation with the following exceptions: none I personally reviewed and interpreted cardiac monitoring: normal sinus rhythm  I personally reviewed and interpreted the pt's EKG: see above for interpretation  I have reviewed the patients home medications and made adjustments as needed Consults: Neurology  Portions of this note were generated with Dragon dictation software. Dictation errors may occur despite best  attempts at proofreading.     CRITICAL CARE Performed by: Rondel Baton   Total critical care time: 30 minutes  Critical care time was exclusive of separately billable procedures and treating other patients.  Critical care was necessary to treat or prevent imminent or life-threatening  deterioration.  Critical care was time spent personally by me on the following activities: development of treatment plan with patient and/or surrogate as well as nursing, discussions with consultants, evaluation of patient's response to treatment, examination of patient, obtaining history from patient or surrogate, ordering and performing treatments and interventions, ordering and review of laboratory studies, ordering and review of radiographic studies, pulse oximetry and re-evaluation of patient's condition.   Final Clinical Impression(s) / ED Diagnoses Final diagnoses:  Cerebrovascular accident (CVA) due to occlusion of left middle cerebral artery [facility])    Rx / DC Orders ED Discharge Orders     None         Rondel Baton, MD 04/12/23 1321

## 2023-04-12 NOTE — H&P (Signed)
History and Physical    Dennis Hopkins ZOX:[id number] DOB: 01-10-1961 DOA: 04/12/2023  PCP: Donato Schultz, DO   Patient coming from: Home   Chief Complaint:  Chief Complaint  Patient presents with   Facial Droop    HPI:  Dennis Hopkins is a 62 y.o. male with medical history significant of CAD status post CABG, hypertension, insulin-dependent DM type II, hyperlipidemia, aspirin allergy, chronic alcohol use, and obesity presented to [facility] emergency department with complaining of right-sided weakness and last known normal 10 PM 04/11/2023.  Patient presented with right-sided facial droop and right-sided upper extremity weakness. Per chart review of the accepting physician physician progress note EDP discussed with on-call neurology who recommended admission for stroke workup.  Patient had a CTA head which showed left MCA territory infarction along with ICA severe stenosis as well as occlusion of the left vertebral artery.  Reported passed swallow screen.  Out of window for thrombectomy.  Lab work, blood glucose 335.  Blood alcohol level less than 10. CBC grossly unremarkable. CMP showing low potassium 3.1 and evidence of chronic transaminitis and elevated bilirubin 1.7.  Elevated anion gap 16. UDS negative.  Per chart review patient's most recent blood pressure 202/108, respiratory 18, heart rate 111. Given patient is allergic to aspirin neurologist recommended to give Plavix 300 mg one-time dose, and transferred to [facility] for further stroke workup. In the ED patient also received 1 L of LR bolus.  During evaluation, patient is still complaining about right-sided upper extremity weakness and patient has right-sided facial droop as well.  Patient denies any chest pain, palpitation, headache, blurry vision, speech difficulty, swallowing difficulty, stopped left-sided upper lower extremity weakness, seizure, loss of consciousness and abdominal pain.   Review of  Systems:  Review of Systems  Constitutional:  Negative for chills, diaphoresis, fever, malaise/fatigue and weight loss.  HENT:         Right-sided upper lip droop  Eyes:  Negative for blurred vision, double vision and photophobia.  Respiratory:  Negative for cough, sputum production and shortness of breath.   Cardiovascular:  Negative for chest pain, palpitations, orthopnea, claudication and leg swelling.  Gastrointestinal:  Negative for heartburn and nausea.  Genitourinary:  Negative for dysuria.  Musculoskeletal:  Negative for back pain, myalgias and neck pain.  Neurological:  Positive for focal weakness. Negative for dizziness, tremors, sensory change, speech change, seizures, loss of consciousness, weakness and headaches.       Right-sided upper extremity weakness  Psychiatric/Behavioral:  The patient is not nervous/anxious.     Past Medical History:  Diagnosis Date   Chickenpox    Hyperlipemia    Hypertension    Kidney stones 2013    Past Surgical History:  Procedure Laterality Date   CARDIAC CATHETERIZATION N/A 01/07/2016   Procedure: Left Heart Cath and Coronary Angiography;  Surgeon: Corky Crafts, MD;  Location: [facility] INVASIVE CV LAB;  Service: Cardiovascular;  Laterality: N/A;   CORONARY ARTERY BYPASS GRAFT N/A 01/13/2016   Procedure: CORONARY ARTERY BYPASS GRAFTING (CABG) x4 Endoscopic Harvesting of the Right Greater Saphenous Vein;  Surgeon: Loreli Slot, MD;  Location: [facility] OR;  Service: Open Heart Surgery;  Laterality: N/A;   TEE WITHOUT CARDIOVERSION N/A 01/13/2016   Procedure: TRANSESOPHAGEAL ECHOCARDIOGRAM (TEE);  Surgeon: Loreli Slot, MD;  Location: [facility] OR;  Service: Open Heart Surgery;  Laterality: N/A;   TONSILLECTOMY       reports that he quit smoking about 9 years  ago. His smoking use included cigarettes. He started smoking about 44 years ago. He has a 70 pack-year smoking history. He has never used smokeless tobacco. He reports current alcohol  use. He reports that he does not use drugs.  Allergies  Allergen Reactions   Asa [Aspirin] Anaphylaxis, Swelling and Other (See Comments)    Lips swelling    Family History  Problem Relation Age of Onset   Arthritis Mother    Diabetes Mother    Arthritis Father    Hyperlipidemia Father    Diabetes Father    Diabetes Brother     Prior to Admission medications   Medication Sig Start Date End Date Taking? Authorizing Provider  Continuous Glucose Receiver (DEXCOM G7 RECEIVER) DEVI As directed 02/24/23  Yes Seabron Spates R, DO  Continuous Glucose Sensor (DEXCOM G7 SENSOR) MISC Use to check blood glucose at least 4 times daily. Change sensor every 10 days. 02/24/23  Yes Seabron Spates R, DO  gabapentin (NEURONTIN) 100 MG capsule TAKE 1 TO 3 CAPSULES BY MOUTH THREE TIMES DAILY 03/17/23  Yes Lowne Chase, Yvonne R, DO  HUMALOG KWIKPEN 100 UNIT/ML KwikPen INJECT 8 TO 18 UNITS SUBCUTANEOUSLY THREE TIMES DAILY Patient taking differently: Inject 8-18 Units into the skin 3 (three) times daily. 08/24/22  Yes Seabron Spates R, DO  Insulin Pen Needle 32G X 4 MM MISC 1 Device by Does not apply route in the morning, at noon, in the evening, and at bedtime. 04/13/21  Yes Shamleffer, Konrad Dolores, MD  LANTUS SOLOSTAR 100 UNIT/ML Solostar Pen Inject 40 Units into the skin daily. 02/27/23  Yes [provider]  metoprolol (TOPROL-XL) 200 MG 24 hr tablet Take 1 tablet (200 mg total) by mouth daily. 09/08/22  Yes Seabron Spates R, DO  tiZANidine (ZANAFLEX) 4 MG tablet Take 1 tablet (4 mg total) by mouth every 8 (eight) hours as needed for muscle spasms. 02/09/23  Yes Donato Schultz, DO  traMADol (ULTRAM) 50 MG tablet Take 1 tablet (50 mg total) by mouth every 6 (six) hours as needed for moderate pain. 02/17/23  Yes Seabron Spates R, DO  augmented betamethasone dipropionate (DIPROLENE AF) 0.05 % cream Apply topically 2 (two) times daily. Patient not taking: Reported on 04/12/2023  08/24/20   Zola Button, Grayling Congress, DO  ciclopirox [facility]) 8 % solution Apply topically at bedtime. Apply over nail and surrounding skin. Apply daily over previous coat. After seven (7) days, may remove with alcohol and continue cycle. Patient not taking: Reported on 04/12/2023 11/16/21   Vivi Barrack, DPM  clopidogrel (PLAVIX) 75 MG tablet Take 1 tablet (75 mg total) by mouth daily. Patient not taking: Reported on 04/12/2023 11/11/21   Zola Button, Grayling Congress, DO  clotrimazole-betamethasone (LOTRISONE) cream Apply 1 Application topically daily. Patient not taking: Reported on 04/12/2023 02/17/23   Zola Button, Grayling Congress, DO  dapagliflozin propanediol (FARXIGA) 10 MG TABS tablet Take 1 tablet (10 mg total) by mouth daily before breakfast. Patient not taking: Reported on 04/12/2023 11/09/21   Donato Schultz, DO  ezetimibe (ZETIA) 10 MG tablet Take 1 tablet (10 mg total) by mouth daily. Patient not taking: Reported on 04/12/2023 09/15/22   Zola Button, Grayling Congress, DO  furosemide (LASIX) 20 MG tablet Take 1 tablet (20 mg total) by mouth daily. Patient not taking: Reported on 04/12/2023 12/06/21 04/12/23  Seabron Spates R, DO  insulin glargine-yfgn (SEMGLEE) 100 UNIT/ML Pen Inject 40 Units into the skin  daily. Patient not taking: Reported on 04/12/2023 07/06/22   Zola Button, Grayling Congress, DO  ketoconazole (NIZORAL) 2 % cream Apply 1 application. topically daily. Patient not taking: Reported on 04/12/2023 11/16/21   Vivi Barrack, DPM  rosuvastatin (CRESTOR) 40 MG tablet Take 1 tablet (40 mg total) by mouth daily. Patient not taking: Reported on 04/12/2023 07/04/22   Seabron Spates R, DO  triamcinolone cream (KENALOG) 0.1 % Apply 1 Application topically 2 (two) times daily. Patient not taking: Reported on 04/12/2023 04/12/22   Donato Schultz, DO     Physical Exam: Vitals:   04/12/23 1601 04/12/23 1840 04/12/23 1953 04/12/23 2003  BP:  (!) 202/108  (!) 166/70  Pulse:    91   Resp:  18    Temp: 98.3 F (36.8 C) 98.2 F (36.8 C)  98.4 F (36.9 C)  TempSrc: Oral   Oral  SpO2:   97% 97%  Weight:  106.3 kg    Height:  5\' 10"  (1.778 m)      Physical Exam Constitutional:      General: He is not in acute distress.    Appearance: He is not ill-appearing.  HENT:     Head: Normocephalic and atraumatic.     Nose: Nose normal.     Mouth/Throat:     Mouth: Mucous membranes are moist.  Eyes:     General: No visual field deficit.    Pupils: Pupils are equal, round, and reactive to light.  Cardiovascular:     Rate and Rhythm: Normal rate and regular rhythm.     Pulses: Normal pulses.     Heart sounds: Normal heart sounds.  Pulmonary:     Effort: Pulmonary effort is normal.     Breath sounds: Normal breath sounds.  Abdominal:     General: Bowel sounds are normal. There is no distension.  Musculoskeletal:     Cervical back: Neck supple.     Right lower leg: No edema.     Left lower leg: No edema.  Skin:    Capillary Refill: Capillary refill takes less than 2 seconds.  Neurological:     Mental Status: He is alert and oriented to person, place, and time.     Cranial Nerves: Cranial nerve deficit and facial asymmetry present. No dysarthria.     Sensory: Sensation is intact.     Motor: Weakness present. No tremor, atrophy, abnormal muscle tone, seizure activity or pronator drift.     Deep Tendon Reflexes: Reflexes normal.     Comments: Right-sided upper extremity weakness and muscle strength 2/5. Right lower extremity muscle strength 5/5. Left-sided upper and lower extremities muscle strength 5/5 without any weakness. There are no sensory deficit of bilateral lower and upper extremities.      Labs on Admission: I have personally reviewed following labs and imaging studies  CBC: Recent Labs  Lab 04/12/23 1054  WBC 6.7  NEUTROABS 3.3  HGB 15.4  HCT 45.3  MCV 86.9  PLT 161   Basic Metabolic Panel: Recent Labs  Lab 04/12/23 1054  NA 137  K  3.1*  CL 98  CO2 23  GLUCOSE 241*  BUN 13  CREATININE 1.07  CALCIUM 9.1   GFR: Estimated Creatinine Clearance: 87.4 mL/min (by C-G formula based on SCr of 1.07 mg/dL). Liver Function Tests: Recent Labs  Lab 04/12/23 1054  AST 44*  ALT 56*  ALKPHOS 72  BILITOT 1.7*  PROT 8.2*  ALBUMIN 4.1   No results  for input(s): "LIPASE", "AMYLASE" in the last 168 hours. No results for input(s): "AMMONIA" in the last 168 hours. Coagulation Profile: No results for input(s): "INR", "PROTIME" in the last 168 hours. Cardiac Enzymes: No results for input(s): "CKTOTAL", "CKMB", "CKMBINDEX", "TROPONINI", "TROPONINIHS" in the last 168 hours. BNP (last 3 results) No results for input(s): "BNP" in the last 8760 hours. HbA1C: No results for input(s): "HGBA1C" in the last 72 hours. CBG: Recent Labs  Lab 04/12/23 1044  GLUCAP 235*   Lipid Profile: No results for input(s): "CHOL", "HDL", "LDLCALC", "TRIG", "CHOLHDL", "LDLDIRECT" in the last 72 hours. Thyroid Function Tests: No results for input(s): "TSH", "T4TOTAL", "FREET4", "T3FREE", "THYROIDAB" in the last 72 hours. Anemia Panel: No results for input(s): "VITAMINB12", "FOLATE", "FERRITIN", "TIBC", "IRON", "RETICCTPCT" in the last 72 hours. Urine analysis:    Component Value Date/Time   COLORURINE YELLOW 04/12/2023 1054   APPEARANCEUR CLEAR 04/12/2023 1054   LABSPEC 1.010 04/12/2023 1054   PHURINE 6.0 04/12/2023 1054   GLUCOSEU 250 (A) 04/12/2023 1054   HGBUR LARGE (A) 04/12/2023 1054   BILIRUBINUR NEGATIVE 04/12/2023 1054   BILIRUBINUR negative 08/24/2020 1602   KETONESUR 15 (A) 04/12/2023 1054   PROTEINUR NEGATIVE 04/12/2023 1054   UROBILINOGEN 0.2 08/24/2020 1602   UROBILINOGEN 0.2 08/12/2008 2018   NITRITE NEGATIVE 04/12/2023 1054   LEUKOCYTESUR NEGATIVE 04/12/2023 1054    Radiological Exams on Admission: I have personally reviewed images CT ANGIO HEAD NECK W WO CM  Result Date: 04/12/2023 CLINICAL DATA:  RUE weakness/numb,  facial droop. EXAM: CT ANGIOGRAPHY HEAD AND NECK WITH AND WITHOUT CONTRAST TECHNIQUE: Multidetector CT imaging of the head and neck was performed using the standard protocol during bolus administration of intravenous contrast. Multiplanar CT image reconstructions and MIPs were obtained to evaluate the vascular anatomy. Carotid stenosis measurements (when applicable) are obtained utilizing NASCET criteria, using the distal internal carotid diameter as the denominator. RADIATION DOSE REDUCTION: This exam was performed according to the departmental dose-optimization program which includes automated exposure control, adjustment of the mA and/or kV according to patient size and/or use of iterative reconstruction technique. CONTRAST:  75mL OMNIPAQUE IOHEXOL 350 MG/ML SOLN COMPARISON:  None Available. FINDINGS: CT HEAD FINDINGS Brain: No acute hemorrhage. Patchy loss of gray-white differentiation in the high left frontal and parietal lobes, suspicious for acute infarcts. Old infarct in the right middle frontal gyrus. No hydrocephalus or extra-axial collection. No mass effect or midline shift. Vascular: No hyperdense vessel or unexpected calcification. Skull: No calvarial fracture or suspicious bone lesion. Skull base is unremarkable. Sinuses/Orbits: Near-complete opacification of the left sphenoid sinus. Other: None. Review of the MIP images confirms the above findings CTA NECK FINDINGS Aortic arch: Three-vessel arch configuration. Ulcerated atherosclerotic plaque along the aortic arch. Postoperative changes from prior median sternotomy and CABG. Arch vessel origins are patent. Right carotid system: At least 80% stenosis of the proximal right cervical ICA. Precise measurement is limited by blooming artifact from adjacent calcified plaque. Left carotid system: Approximately 70% stenosis of the proximal left cervical ICA. Vertebral arteries: Right dominant. The non dominant left vertebral artery is occluded at its origin  with reconstitution of the distal left V2 segment, likely via retrograde flow from the basilar artery. Skeleton: Multilevel cervical spondylosis, worst at C4-5, where there is at least mild spinal canal stenosis. Other neck: Unremarkable. Upper chest: Unremarkable. 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 Moderate to severe stenosis of the left  ICA communicating segment, just proximal to the origin of the left posterior communicating artery. Moderate to severe stenosis of the right ICA supraclinoid segment. The proximal ACAs and MCAs are patent without stenosis or aneurysm. Distal branches are symmetric. Posterior circulation: Normal basilar artery. The SCAs, AICAs and PICAs are patent proximally. The PCAs are patent proximally without stenosis or aneurysm. Distal branches are symmetric. Venous sinuses: As permitted by contrast timing, patent. Anatomic variants: None. Review of the MIP images confirms the above findings IMPRESSION: 1. Patchy loss of gray-white differentiation in the high left frontal and parietal lobes, suspicious for acute left MCA territory infarcts. No acute hemorrhage. 2. No large vessel occlusion. 3. 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 Moderate to severe stenosis of the left ICA communicating segment, just proximal to the origin of the left posterior communicating artery. 4. Moderate to severe stenosis of the right ICA supraclinoid segment. 5. At least 80% stenosis of the proximal right cervical ICA. Approximately 70% stenosis of the proximal left cervical ICA. 6. 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 Aortic Atherosclerosis (ICD10-I70.0). Electronically Signed   By: Orvan Falconer M.D.   On: 04/12/2023 12:39    EKG: My personal interpretation of  EKG shows: EKG showing sinus tachycardia heart rate 110.  There is no ST-T wave abnormality.    Assessment/Plan: Principal Problem:   Acute CVA (cerebrovascular accident) (HCC) Active Problems:   Insulin dependent type 2 diabetes mellitus (HCC)   Essential hypertension   Hyperlipidemia   History of CAD (coronary artery disease)   Chronic alcohol use   Hypokalemia   Morbid obesity (HCC)   Peripheral neuropathy    Assessment and Plan: Acute ischemic CVA Left MCA territory infarction -Patient ended to ED at [facility] complaining of right-sided facial droop and right-sided weakness last known well 10 PM 04/11/2023.  At presentation to ED patient is hemodynamically stable. -GI has been consulted by the ED provider and code stroke activated.  On-call neurology recommended CTA head, MRI of the head and transfer patient to [facility] for stroke workup. -CTA head showed  Patchy loss of gray-white differentiation in the high left frontal and parietal lobes, suspicious for acute left MCA territory infarcts. No acute hemorrhage. At least 80% stenosis of the proximal right cervical ICA. Approximately 70% stenosis of the proximal left cervical ICA. 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 -Pending MRI of the head. - Based on the CTA finding per neurology recommendation patient has been treated with Plavix load 300 mg while in the ED.  Due to aspirin allergy does prohibited. - Spoke with on-call neurology Dr.Khaliqdina who recommended to obtain the MRI and continue to follow-up with the patient - Obtaining echo cardiogram with bubble study. -Status post 300 mg of Plavix load.  Continue Plavix 75 mg daily from tomorrow.  Patient is allergic to aspirin. -Continue high intensity statin. - Checking A1c and lipid panel. - Patient passed bedside swallow screen.   Diet ordered. - Ordered PT and OT evaluation. - Continue neurocheck every 4 hours and continue fall precaution.  Continue aspiration precaution and keep head of the bed rise above 30 degree angle. -Continue permissive hypertension up to 24  hours.  Goal to keep systolic blood pressure above 782  and diastolic blood pressure above 811.  Ordered as needed hydralazine with the specific parameters.  Holding home Toprol-XL. -Continue cardiac monitoring. -Stroke order set has been utilized -Appreciate neurology input. -Consider consult vascular surgery in the morning regarding bilateral carotid stenosis evaluation.  Essential hypertension -Patient is hemodynamically stable except in 2 occasions blood pressure found elevated up to 202/108.  Need to continue permissive hypertension up to 24 hours.Goal to keep systolic blood pressure above 914 and diastolic blood pressure above 782.  Ordered as needed hydralazine with the specific parameters.  Holding home Toprol-XL. -Consider to resume Toprol-XL from tomorrow.  History of CAD status post CABG -Holding Toprol-XL in the setting of permissive hypertension.  Cannot - Allergy to aspirin.  Currently on Plavix for acute CVA.  Continue Crestor and Zetia.  Hyperlipidemia -Continue Crestor and ezetimibe  Hypokalemia -Potassium 3.1. - Repleted with oral KCl 40 mEq 1 dose.  Insulin-dependent DM type II -A1c 9.1 in August 2024 - Continue Semglee 40 unit in the a.m., low sliding scale insulin and bedtime insulin coverage as needed.  POC blood glucose check  Peripheral neuropathy due to chronic diabetes -Continue gabapentin 300 mg 3 times daily - Continue fall precaution  Morbid obesity -BMI 33.63. -Counseled patient bedside for weight reduction, exercise and diet management  History of chronic alcohol use currently patient is sober. -Blood alcohol level less than 10.   DVT prophylaxis:  SCDs.  Deferring pharmacological prophylaxis until MRI results  will be available. Code Status:  Full Code Diet: Heart healthy and carb modified diet Family Communication: No family member at bedside now Disposition Plan: Tentative discharge to home next 2 to 3 days. Consults: Neurology Admission status:   Inpatient, Telemetry bed  Severity of Illness: The appropriate patient status for this patient is INPATIENT. Inpatient status is judged to be reasonable and necessary in order to provide the required intensity of service to ensure the patient's safety. The patient's presenting symptoms, physical exam findings, and initial radiographic and laboratory data in the context of their chronic comorbidities is felt to place them at high risk for further clinical deterioration. Furthermore, it is not anticipated that the patient will be medically stable for discharge from the hospital within 2 midnights of admission.   * I certify that at the point of admission it is my clinical judgment that the patient will require inpatient hospital care spanning beyond 2 midnights from the point of admission due to high intensity of service, high risk for further deterioration and high frequency of surveillance required.Marland Kitchen    Tereasa Coop, MD [facility]  How to contact the [facility] Attending or Consulting provider 7A - 7P or covering provider during after hours 7P -7A, for this patient.  Check the care team in [facility] and look for a) attending/consulting [facility] provider listed and b) the [facility] team listed Log into [email or web address] and use [place]'s universal password to access. If you do not have the password, please contact the hospital operator. Locate the [facility] provider you are looking for under [facility] and page to a number that you can be directly reached. If you still have difficulty reaching the provider, please page the [facility] (Director on Call) for the Hospitalists listed on amion for assistance.  04/12/2023, 8:38 PM

## 2023-04-12 NOTE — Progress Notes (Addendum)
Plan of Care Note for accepted transfer  Patient: Dennis Hopkins    WGN:[id number]  DOA: 04/12/2023     Nursing staff, Please call [facility] Admits & Consults System-Wide number on Amion as soon as patient's arrival to the unit (not the listed attending) so that the appropriate admitting provider can evaluate the pt. ASAP to avoid any delay in care.  Facility requesting transfer: [facility] Requesting Provider: Rolly Pancake, EDP Reason for transfer: Admission for stroke Facility course: PMH of CAD SP CABG, HTN, HLD, aspirin allergy, alcohol use, obesity presented to hospital with complaints of sudden onset of right-sided weakness. Last known normal was 10 PM last night. Presents with facial droop and right upper extremity weakness. EDP discussed with neurology who recommended admission for stroke workup. CTA shows evidence of left MCA territory infarct, along with ICA severe stenosis as well as occlusion of left vertebral artery. Reportedly passed a swallow eval. Out of the window.  For any intervention.  Plan of care: The patient is accepted for admission to Telemetry unit, at [facility]. Will require complete stroke workup including MRI brain.  Author: Lynden Oxford, MD  04/12/2023  Check [email or web address] for on-call coverage.

## 2023-04-13 ENCOUNTER — Other Ambulatory Visit (HOSPITAL_COMMUNITY): Payer: Self-pay

## 2023-04-13 ENCOUNTER — Inpatient Hospital Stay (HOSPITAL_COMMUNITY): Payer: PRIVATE HEALTH INSURANCE

## 2023-04-13 DIAGNOSIS — I639 Cerebral infarction, unspecified: Secondary | ICD-10-CM | POA: Diagnosis not present

## 2023-04-13 LAB — CBC
HCT: 44.3 % (ref 39.0–52.0)
Hemoglobin: 15 g/dL (ref 13.0–17.0)
MCH: 28.8 pg (ref 26.0–34.0)
MCHC: 33.9 g/dL (ref 30.0–36.0)
MCV: 85.2 fL (ref 80.0–100.0)
Platelets: 163 10*3/uL (ref 150–400)
RBC: 5.2 MIL/uL (ref 4.22–5.81)
RDW: 12.8 % (ref 11.5–15.5)
WBC: 7.6 10*3/uL (ref 4.0–10.5)
nRBC: 0 % (ref 0.0–0.2)

## 2023-04-13 LAB — COMPREHENSIVE METABOLIC PANEL
ALT: 50 U/L — ABNORMAL HIGH (ref 0–44)
AST: 38 U/L (ref 15–41)
Albumin: 3.8 g/dL (ref 3.5–5.0)
Alkaline Phosphatase: 63 U/L (ref 38–126)
Anion gap: 16 — ABNORMAL HIGH (ref 5–15)
BUN: 9 mg/dL (ref 8–23)
CO2: 20 mmol/L — ABNORMAL LOW (ref 22–32)
Calcium: 9.2 mg/dL (ref 8.9–10.3)
Chloride: 101 mmol/L (ref 98–111)
Creatinine, Ser: 0.92 mg/dL (ref 0.61–1.24)
GFR, Estimated: >60 mL/min (ref >60)
Glucose, Bld: 253 mg/dL — ABNORMAL HIGH (ref 70–99)
Potassium: 3.5 mmol/L (ref 3.5–5.1)
Sodium: 137 mmol/L (ref 135–145)
Total Bilirubin: 1.4 mg/dL — ABNORMAL HIGH (ref 0.3–1.2)
Total Protein: 7.7 g/dL (ref 6.5–8.1)

## 2023-04-13 LAB — LIPID PANEL
Cholesterol: 221 mg/dL — ABNORMAL HIGH (ref 0–200)
HDL: 38 mg/dL — ABNORMAL LOW (ref >40)
LDL Cholesterol: 162 mg/dL — ABNORMAL HIGH (ref 0–99)
Total CHOL/HDL Ratio: 5.8 {ratio}
Triglycerides: 107 mg/dL (ref <150)
VLDL: 21 mg/dL (ref 0–40)

## 2023-04-13 LAB — GLUCOSE, CAPILLARY
Glucose-Capillary: 224 mg/dL — ABNORMAL HIGH (ref 70–99)
Glucose-Capillary: 205 mg/dL — ABNORMAL HIGH (ref 70–99)
Glucose-Capillary: 197 mg/dL — ABNORMAL HIGH (ref 70–99)
Glucose-Capillary: 216 mg/dL — ABNORMAL HIGH (ref 70–99)

## 2023-04-13 LAB — HIV ANTIBODY (ROUTINE TESTING W REFLEX): HIV Screen 4th Generation wRfx: NONREACTIVE

## 2023-04-13 MED ORDER — LORAZEPAM 2 MG/ML IJ SOLN: 0.0000 mg | Freq: Four times a day (QID) | INTRAMUSCULAR | Status: AC

## 2023-04-13 MED ORDER — THIAMINE MONONITRATE 100 MG PO TABS
100.0000 mg | ORAL_TABLET | Freq: Every day | ORAL | Status: DC
  Administered 2023-04-13 – 2023-04-15 (×3): 100 mg via ORAL
  Filled 2023-04-13 (×3): qty 1

## 2023-04-13 MED ORDER — METOPROLOL TARTRATE 25 MG PO TABS
25.0000 mg | ORAL_TABLET | Freq: Two times a day (BID) | ORAL | Status: DC
  Administered 2023-04-13 – 2023-04-15 (×5): 25 mg via ORAL
  Filled 2023-04-13 (×5): qty 1

## 2023-04-13 MED ORDER — NIACIN 100 MG PO TABS
100.0000 mg | ORAL_TABLET | Freq: Two times a day (BID) | ORAL | Status: DC
  Administered 2023-04-13 – 2023-04-15 (×6): 100 mg via ORAL
  Filled 2023-04-13 (×7): qty 1

## 2023-04-13 MED ORDER — TRAMADOL HCL 50 MG PO TABS
50.0000 mg | ORAL_TABLET | Freq: Four times a day (QID) | ORAL | Status: DC | PRN
  Administered 2023-04-13 – 2023-04-15 (×5): 50 mg via ORAL
  Filled 2023-04-13 (×5): qty 1

## 2023-04-13 MED ORDER — POTASSIUM CHLORIDE 20 MEQ PO PACK
40.0000 meq | PACK | Freq: Once | ORAL | Status: AC
  Administered 2023-04-13: 40 meq via ORAL
  Filled 2023-04-13: qty 2

## 2023-04-13 MED ORDER — LORAZEPAM 2 MG/ML IJ SOLN: 0.0000 mg | Freq: Two times a day (BID) | INTRAMUSCULAR | Status: DC

## 2023-04-13 MED ORDER — THIAMINE HCL 100 MG/ML IJ SOLN
100.0000 mg | Freq: Every day | INTRAMUSCULAR | Status: DC
  Filled 2023-04-13: qty 2

## 2023-04-13 MED ORDER — CHLORDIAZEPOXIDE HCL 5 MG PO CAPS
10.0000 mg | ORAL_CAPSULE | Freq: Three times a day (TID) | ORAL | Status: DC
  Administered 2023-04-13 – 2023-04-15 (×8): 10 mg via ORAL
  Filled 2023-04-13 (×8): qty 2

## 2023-04-13 MED ORDER — FOLIC ACID 1 MG PO TABS
1.0000 mg | ORAL_TABLET | Freq: Every day | ORAL | Status: DC
  Administered 2023-04-13 – 2023-04-15 (×3): 1 mg via ORAL
  Filled 2023-04-13 (×3): qty 1

## 2023-04-13 MED ORDER — ADULT MULTIVITAMIN W/MINERALS CH
1.0000 | ORAL_TABLET | Freq: Every day | ORAL | Status: DC
  Administered 2023-04-13 – 2023-04-15 (×3): 1 via ORAL
  Filled 2023-04-13 (×3): qty 1

## 2023-04-13 MED ORDER — HEPARIN SODIUM (PORCINE) 5000 UNIT/ML IJ SOLN
5000.0000 [IU] | Freq: Three times a day (TID) | INTRAMUSCULAR | Status: AC
  Administered 2023-04-13 (×3): 5000 [IU] via SUBCUTANEOUS
  Filled 2023-04-13 (×3): qty 1

## 2023-04-13 MED ORDER — LORAZEPAM 2 MG/ML IJ SOLN: 1.0000 mg | INTRAMUSCULAR | Status: DC | PRN

## 2023-04-13 MED ORDER — TICAGRELOR 90 MG PO TABS
90.0000 mg | ORAL_TABLET | Freq: Two times a day (BID) | ORAL | Status: DC
  Administered 2023-04-13 – 2023-04-15 (×5): 90 mg via ORAL
  Filled 2023-04-13 (×5): qty 1

## 2023-04-13 MED ORDER — LORAZEPAM 1 MG PO TABS
1.0000 mg | ORAL_TABLET | ORAL | Status: DC | PRN
  Administered 2023-04-13: 2 mg via ORAL
  Filled 2023-04-13: qty 2

## 2023-04-13 NOTE — Progress Notes (Addendum)
STROKE TEAM PROGRESS NOTE   BRIEF HPI [name] is a 62 y.o. male with history of HTN, HLD, CAD s/p CABG, nephrolithiasis who presents with R sided weakness. Symptoms started around 2200 on 04/11/23.   INTERIM HISTORY/SUBJECTIVE Right hand weakness reported by patient. Still has significant dysarthria. Standing at the side of the bed. Recs for outpatient OT.  Significant history of medication noncompliance.  He is currently supposed be on Plavix as he is s/p CABG.  Given widespread severe intracranial atherosclerosis we we will switch him to Brilinta for a minimum of 30 days and then re recommend resuming Plavix unless he can afford Brilinta for long-term.  Patient is allergic to aspirin and develops hives  OBJECTIVE  CBC    Component Value Date/Time   WBC 7.6 04/13/2023 0620   RBC 5.20 04/13/2023 0620   HGB 15.0 04/13/2023 0620   HCT 44.3 04/13/2023 0620   PLT 163 04/13/2023 0620   MCV 85.2 04/13/2023 0620   MCH 28.8 04/13/2023 0620   MCHC 33.9 04/13/2023 0620   RDW 12.8 04/13/2023 0620   LYMPHSABS 2.4 04/12/2023 1054   MONOABS 0.6 04/12/2023 1054   EOSABS 0.3 04/12/2023 1054   BASOSABS 0.1 04/12/2023 1054    BMET    Component Value Date/Time   NA 137 04/13/2023 0620   NA 139 09/23/2020 0000   K 3.5 04/13/2023 0620   CL 101 04/13/2023 0620   CO2 20 (L) 04/13/2023 0620   GLUCOSE 253 (H) 04/13/2023 0620   BUN 9 04/13/2023 0620   BUN 24 (A) 09/23/2020 0000   CREATININE 0.92 04/13/2023 0620   CREATININE 1.04 02/17/2023 1344   CALCIUM 9.2 04/13/2023 0620   EGFR 76 09/14/2020 1514   GFRNONAA >60 04/13/2023 0620    IMAGING past 24 hours MR BRAIN WO CONTRAST  Result Date: 04/13/2023 CLINICAL DATA:  Stroke follow-up.  Abnormal head CT. EXAM: MRI HEAD WITHOUT CONTRAST TECHNIQUE: Multiplanar, multiecho pulse sequences of the brain and surrounding structures were obtained without intravenous contrast. COMPARISON:  CTA head neck 04/12/2023 FINDINGS: Brain: Multifocal  abnormal diffusion restriction within the left hemisphere, affecting the MCA and PCA territories. There is petechial hemorrhage throughout most of these sites of acute ischemia, with surrounding cytotoxic edema. There is multifocal hyperintense T2-weighted signal within the white matter. Parenchymal volume and CSF spaces are normal. Old right frontal infarct. The midline structures are normal. Vascular: Normal flow voids. Skull and upper cervical spine: Normal calvarium and skull base. Visualized upper cervical spine and soft tissues are normal. Sinuses/Orbits:Mild sphenoid and right maxillary sinus mucosal thickening. Normal orbits. IMPRESSION: 1. Multiple acute infarcts within the left hemisphere, affecting the MCA and PCA territories. 2. Petechial hemorrhage at these acute infarct sites. 3. Old right frontal infarct and findings of chronic small vessel ischemia. Electronically Signed   By: Deatra Robinson M.D.   On: 04/13/2023 01:21   CT ANGIO HEAD NECK W WO CM  Result Date: 04/12/2023 CLINICAL DATA:  RUE weakness/numb, facial droop. EXAM: CT ANGIOGRAPHY HEAD AND NECK WITH AND WITHOUT CONTRAST TECHNIQUE: Multidetector CT imaging of the head and neck was performed using the standard protocol during bolus administration of intravenous contrast. Multiplanar CT image reconstructions and MIPs were obtained to evaluate the vascular anatomy. Carotid stenosis measurements (when applicable) are obtained utilizing NASCET criteria, using the distal internal carotid diameter as the denominator. RADIATION DOSE REDUCTION: This exam was performed according to the departmental dose-optimization program which includes automated exposure control, adjustment of the mA and/or  kV according to patient size and/or use of iterative reconstruction technique. CONTRAST:  75mL OMNIPAQUE IOHEXOL 350 MG/ML SOLN COMPARISON:  None Available. FINDINGS: CT HEAD FINDINGS Brain: No acute hemorrhage. Patchy loss of gray-white differentiation in  the high left frontal and parietal lobes, suspicious for acute infarcts. Old infarct in the right middle frontal gyrus. No hydrocephalus or extra-axial collection. No mass effect or midline shift. Vascular: No hyperdense vessel or unexpected calcification. Skull: No calvarial fracture or suspicious bone lesion. Skull base is unremarkable. Sinuses/Orbits: Near-complete opacification of the left sphenoid sinus. Other: None. Review of the MIP images confirms the above findings CTA NECK FINDINGS Aortic arch: Three-vessel arch configuration. Ulcerated atherosclerotic plaque along the aortic arch. Postoperative changes from prior median sternotomy and CABG. Arch vessel origins are patent. Right carotid system: At least 80% stenosis of the proximal right cervical ICA. Precise measurement is limited by blooming artifact from adjacent calcified plaque. Left carotid system: Approximately 70% stenosis of the proximal left cervical ICA. Vertebral arteries: Right dominant. The non dominant left vertebral artery is occluded at its origin with reconstitution of the distal left V2 segment, likely via retrograde flow from the basilar artery. Skeleton: Multilevel cervical spondylosis, worst at C4-5, where there is at least mild spinal canal stenosis. Other neck: Unremarkable. Upper chest: Unremarkable. 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 Moderate to severe stenosis of the left ICA communicating segment, just proximal to the origin of the left posterior communicating artery. Moderate to severe stenosis of the right ICA supraclinoid segment. The proximal ACAs and MCAs are patent without stenosis or aneurysm. Distal branches are symmetric. Posterior circulation: Normal basilar artery. The SCAs, AICAs and PICAs are patent proximally. The PCAs are patent proximally without stenosis or aneurysm. Distal branches are symmetric. Venous  sinuses: As permitted by contrast timing, patent. Anatomic variants: None. Review of the MIP images confirms the above findings IMPRESSION: 1. Patchy loss of gray-white differentiation in the high left frontal and parietal lobes, suspicious for acute left MCA territory infarcts. No acute hemorrhage. 2. No large vessel occlusion. 3. 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 Moderate to severe stenosis of the left ICA communicating segment, just proximal to the origin of the left posterior communicating artery. 4. Moderate to severe stenosis of the right ICA supraclinoid segment. 5. At least 80% stenosis of the proximal right cervical ICA. Approximately 70% stenosis of the proximal left cervical ICA. 6. 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 Aortic Atherosclerosis (ICD10-I70.0). Electronically Signed   By: Orvan Falconer M.D.   On: 04/12/2023 12:39    Vitals:   04/13/23 0115 04/13/23 0200 04/13/23 0400 04/13/23 0836  BP: (!) 189/93  (!) 223/108 (!) 211/94  Pulse:   100   Resp: (!) 25 (!) 25 17 (!) 25  Temp: 97.9 F (36.6 C)  99 F (37.2 C) 98.8 F (37.1 C)  TempSrc: Oral  Oral Oral  SpO2:   97%   Weight:      Height:         PHYSICAL EXAM General: Mildly obese middle-aged African-American patient in no acute distress Psych:  Mood and affect appropriate for situation CV: Regular rate and rhythm on monitor Respiratory:  Regular, unlabored respirations on room air GI: Abdomen soft and nontender   NEURO:  Mental  Status: AA&Ox3, patient is able to give clear and coherent history Speech/Language: speech is without dysarthria or aphasia.  Naming, repetition, fluency, and comprehension intact.  Cranial Nerves:  II: PERRL. Visual fields full.  III, IV, VI: EOMI. Eyelids elevate symmetrically.  V: Sensation is intact to light touch and symmetrical to face.   VII: Face is symmetrical resting and smiling VIII: hearing intact to voice. IX, X: Palate elevates symmetrically. Phonation is normal.  ZO:[id number] shrug 5/5. XII: tongue is midline without fasciculations. Motor:  RUE 4/5 diminished fine finger movements on the right and orbits left or the right upper extremity. LUE 5/5 RLE 5/5 LLE 5/5 Right coordination slower Tone: is normal and bulk is normal Sensation- Intact to light touch bilaterally. Extinction absent to light touch to DSS.   Coordination: FTN intact bilaterally, HKS: no ataxia in BLE.No drift.  Gait- deferred  ASSESSMENT/PLAN  Acute Ischemic Infarct:  Scattered acute infarcts in the left MCA and PCA territories  Etiology:  significant widespread intracranial atherosclerosis  Code Stroke CT head - Patchy loss of gray-white differentiation in the high left frontal and parietal lobes, suspicious for acute left MCA territory infarcts. No acute hemorrhage. 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 Moderate to severe stenosis of the left ICA communicating segment, just proximal to the origin of the left posterior communicating artery. Moderate to severe stenosis of the right ICA supraclinoid segment. At least 80% stenosis of the proximal right cervical ICA. Approximately 70% stenosis of the proximal left cervical ICA. 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 MRI  Multiple acute infarcts within the left hemisphere, affecting the MCA and PCA territories. 2D Echo with bubble study pending LDL 162 HgbA1c 9.1 VTE prophylaxis - lovenox No antithrombotic (prescribed plavix) prior to admission, now on clopidogrel 75 mg daily switch to brilinta for a minimum of 30 days and then back to plavix  Therapy recommendations:  Outpatient PT/OT/ST Disposition:  Pending   Congestive heart  failure Hypertension CAD s/p CABG Home meds: Metoprolol, Lasix, Farxiga - reported not taking  Stable Blood Pressure Goal: BP less than 220/110   Hyperlipidemia Home meds: Prescribed Crestor, Zetia-reported not taking, resumed in hospital LDL 162, goal < 70 Resume Crestor and Zetia Continue statin at discharge  Diabetes type II Uncontrolled Home meds: Farxiga, insulin HgbA1c 9.1, goal < 7.0 CBGs SSI Recommend close follow-up with PCP for better DM control  Other Stroke Risk Factors ETOH use, alcohol level <10, advised to drink no more than 2 drink(s) a day Obesity, Body mass index is 33.63 kg/m., BMI >/= 30 associated with increased stroke risk, recommend weight loss, diet and exercise as appropriate    Hospital day # 1   Patient seen and examined by NP/APP with MD. MD to update note as needed.   Elmer Picker, DNP, FNP-BC [facility] Pager: [phone]  I have personally obtained history,examined this patient, reviewed notes, independently viewed imaging studies, participated in medical decision making and plan of care.ROS completed by me personally and pertinent positives fully documented  I have made any additions or clarifications directly to the above note. Agree with note above.  Patient presented with right hand weakness and speech difficulties secondary to left MCA branch infarct due to high-grade terminal carotid stenosis from diffuse intracranial atherosclerosis.  Recommend dual antiplatelet therapy but since patient is aspirin allergic will recommend  Brilinta for 90 days if he can afford it otherwise hospital pharmacy to provide coupon for 30 days and then switch to Plavix after that.  Maintain aggressive risk factor modification with strict control of hypertension and blood pressure goal below 140/90, lipids with LDL cholesterol goal below 70 mg percent and diabetes with hemoglobin A1c goal below 6.5%.  Patient counseled not to quit smoking and to eat a  healthy diet and to be active and exercise regularly.  Greater than 50% time during this 50-minute visit was spent in counseling and coordination of care and discussion patient and care team and answering questions.  Discussed with Dr. Thedore Mins. Delia Heady, MD Medical Director [facility] Pager: [phone] 04/13/2023 2:49 PM  To contact Stroke Continuity provider, please refer to [email or web address]. After hours, contact General Neurology

## 2023-04-13 NOTE — Plan of Care (Signed)
  Problem: Education: Goal: Knowledge of General Education information will improve Description: Including pain rating scale, medication(s)/side effects and non-pharmacologic comfort measures Outcome: Progressing   Problem: Health Behavior/Discharge Planning: Goal: Ability to manage health-related needs will improve Outcome: Progressing   Problem: Clinical Measurements: Goal: Ability to maintain clinical measurements within normal limits will improve Outcome: Progressing   Problem: Pain Management: Goal: General experience of comfort will improve Outcome: Progressing   Problem: Skin Integrity: Goal: Risk for impaired skin integrity will decrease Outcome: Progressing   Problem: Safety: Goal: Ability to remain free from injury will improve Outcome: Progressing   Problem: Skin Integrity: Goal: Risk for impaired skin integrity will decrease Outcome: Progressing

## 2023-04-13 NOTE — Evaluation (Signed)
Occupational Therapy Evaluation Patient Details Name: Dennis Hopkins MRN: [id number] DOB: 1960/09/22 Today's Date: 04/13/2023   History of Present Illness 62 y.o. male presented to emergency department 04/12/23 with complaining of right-sided weakness; CTA head which showed left MCA territory infarction along with ICA severe stenosis as well as occlusion of the left vertebral artery. MRI multiple acute infarcts L MCA and PCA territories, old R frontal infarct PMH significant of CAD status post CABG, hypertension, insulin-dependent DM type II, hyperlipidemia, aspirin allergy, chronic alcohol use, and obesity   Clinical Impression   Pt admitted for above, hard to make out his speech but reports being ind and living alone, still driving PTA. During visual testing pt noted to not track beyond midline, identifies # of therapists fingers but strongly suggest he receive further testing of vision.  He gets around with CGA to supervision assist but has mild LOBs due to a slight scissor in gait, reportedly did better during PT session. He also exhibits new RUE deficits which impact functional use, needed max A for socks and setup assist for other ADLs. OT to continue to follow pt acutely to address deficits and help transition to next level of care. Recommend pt receive outpatient therapy for further vision screening and RUE deficits.       If plan is discharge home, recommend the following: A little help with walking and/or transfers;A little help with bathing/dressing/bathroom;Assistance with cooking/housework    Functional Status Assessment  Patient has had a recent decline in their functional status and demonstrates the ability to make significant improvements in function in a reasonable and predictable amount of time.  Equipment Recommendations  None recommended by OT (pt reports he has needed DME)    Recommendations for Other Services       Precautions / Restrictions Precautions Precautions:  Fall Restrictions Weight Bearing Restrictions: No      Mobility Bed Mobility               General bed mobility comments: up in recliner on arrival    Transfers Overall transfer level: Needs assistance Equipment used: None Transfers: Sit to/from Stand Sit to Stand: Supervision           General transfer comment: STS x2 supervision, just after second stand he bumped into the wall on his R side      Balance Overall balance assessment: Needs assistance Sitting-balance support: Feet supported Sitting balance-Leahy Scale: Good Sitting balance - Comments: in recliner no fully assed   Standing balance support: During functional activity Standing balance-Leahy Scale: Fair                             ADL either performed or assessed with clinical judgement   ADL Overall ADL's : Needs assistance/impaired Eating/Feeding: Set up;Sitting   Grooming: Sitting;Set up   Upper Body Bathing: Sitting;Set up   Lower Body Bathing: Set up;Sitting/lateral leans   Upper Body Dressing : Set up;Standing Upper Body Dressing Details (indicate cue type and reason): don gown like jacket Lower Body Dressing: Sitting/lateral leans;Maximal assistance Lower Body Dressing Details (indicate cue type and reason): Max A to don bilat socks, initially had some slight challenge locating sock in his lap (it was on his L thigh) Toilet Transfer: Contact guard Marine scientist Details (indicate cue type and reason): based on room level ambulation Toileting- Clothing Manipulation and Hygiene: Contact guard assist;Sit to/from stand       Functional mobility during  ADLs: Contact guard assist       Vision   Vision Assessment?: Vision impaired- to be further tested in functional context Additional Comments: Pt denies blurry or double vision, Not tracking beyond midline to the Rt and undershoots with RUE and L eye close     Perception         Praxis          Pertinent Vitals/Pain Pain Assessment Pain Assessment: Faces Faces Pain Scale: Hurts little more Pain Location: back Pain Descriptors / Indicators: Aching, Discomfort, Sore Pain Intervention(s): Limited activity within patient's tolerance, Monitored during session     Extremity/Trunk Assessment Upper Extremity Assessment Upper Extremity Assessment: Right hand dominant;Generalized weakness;RUE deficits/detail;LUE deficits/detail RUE Deficits / Details: very weak gross grasp, rests hand with fingers flexed, not pulling them into extension without assist. sensation intact LUE Deficits / Details: tingling of L palm and fingers   Lower Extremity Assessment Lower Extremity Assessment: Defer to PT evaluation       Communication Communication Communication: Difficulty communicating thoughts/reduced clarity of speech Cueing Techniques: Verbal cues   Cognition Arousal: Alert Behavior During Therapy: WFL for tasks assessed/performed Overall Cognitive Status: Difficult to assess                                       General Comments  VSS on RA, Bp 182/106 while standing during gait    Exercises     Shoulder Instructions      Home Living Family/patient expects to be discharged to:: Private residence Living Arrangements: Alone   Type of Home: House Home Access: Level entry     Home Layout: One level     Bathroom Shower/Tub: Walk-in shower         Home Equipment: Agricultural consultant (2 wheels);Cane - single point;Shower seat   Additional Comments: Pt very hard to make out his communication, reports he has all DME. Unable to make out full home setup but reports he does not live with anyone      Prior Functioning/Environment Prior Level of Function : Independent/Modified Independent             Mobility Comments: reports ind no AD ADLs Comments: reports ind        OT Problem List: Impaired UE functional use;Impaired balance (sitting and/or  standing);Decreased strength      OT Treatment/Interventions: Self-care/ADL training;Balance training;Therapeutic exercise;Therapeutic activities;Patient/family education;Visual/perceptual remediation/compensation    OT Goals(Current goals can be found in the care plan section) Acute Rehab OT Goals Patient Stated Goal: none stated OT Goal Formulation: With patient Time For Goal Achievement: 04/27/23 Potential to Achieve Goals: Good ADL Goals Pt Will Perform Grooming: with modified independence;standing Pt Will Perform Lower Body Bathing: with modified independence;sit to/from stand Pt Will Perform Upper Body Dressing: with modified independence;standing Pt Will Perform Lower Body Dressing: with modified independence;sit to/from stand Pt Will Transfer to Toilet: with modified independence;ambulating  OT Frequency: Min 1X/week    Co-evaluation              AM-PAC OT "6 Clicks" Daily Activity     Outcome Measure Help from another person eating meals?: A Little Help from another person taking care of personal grooming?: A Little Help from another person toileting, which includes using toliet, bedpan, or urinal?: A Little Help from another person bathing (including washing, rinsing, drying)?: A Little Help from another person to put on and taking off  regular upper body clothing?: A Little Help from another person to put on and taking off regular lower body clothing?: A Lot 6 Click Score: 17   End of Session Equipment Utilized During Treatment: Gait belt Nurse Communication: Mobility status  Activity Tolerance: Patient tolerated treatment well Patient left: Other (comment) (in care of providing PT at nurses station, PT guarding pt)  OT Visit Diagnosis: Unsteadiness on feet (R26.81);Muscle weakness (generalized) (M62.81)                Time: 9562-1308 OT Time Calculation (min): 19 min Charges:  OT General Charges $OT Visit: 1 Visit OT Evaluation $OT Eval Moderate Complexity:  1 Mod  04/13/2023  AB, OTR/L  Acute Rehabilitation Services  Office: [phone]   Tristan Schroeder 04/13/2023, 12:46 PM

## 2023-04-13 NOTE — Evaluation (Signed)
Physical Therapy Evaluation Patient Details Name: Dennis Hopkins MRN: [id number] DOB: [birth year] Today's Date: 04/13/2023  History of Present Illness  62 y.o. male presented to emergency department 04/12/23 with complaining of right-sided weakness; CTA head which showed left MCA territory infarction along with ICA severe stenosis as well as occlusion of the left vertebral artery. MRI multiple acute infarcts L MCA and PCA territories, old R frontal infarct PMH significant of CAD status post CABG, hypertension, insulin-dependent DM type II, hyperlipidemia, aspirin allergy, chronic alcohol use, and obesity  Clinical Impression   Pt admitted secondary to problem above with deficits below. PTA patient was living alone in one level home with level entry.  Pt currently requires supervision for ambulation and was only able to go 50 ft due to back pain (and just walked with OT). Agreed to standing assessment of balance with pt doing well on Berg Balance Assessment items which were completed. OT later mentioned that pt had one occasion of bumping into wall on his right and 2 near-scissoring events when up with them. Based on this and the limited ambulation completed with PT, will plan to follow. Do not anticipate he will need post-acute PT. Anticipate patient will benefit from acute PT to address problems listed below. Will continue to follow acutely to maximize functional mobility independence and safety.           If plan is discharge home, recommend the following: Assistance with cooking/housework;Assist for transportation   Can travel by private vehicle        Equipment Recommendations None recommended by PT  Recommendations for Other Services       Functional Status Assessment Patient has had a recent decline in their functional status and demonstrates the ability to make significant improvements in function in a reasonable and predictable amount of time.     Precautions / Restrictions  Precautions Precautions: Fall Restrictions Weight Bearing Restrictions: No      Mobility  Bed Mobility                    Transfers Overall transfer level: Needs assistance Equipment used: None Transfers: Sit to/from Stand Sit to Stand: Modified independent (Device/Increase time)                Ambulation/Gait Ambulation/Gait assistance: Supervision Gait Distance (Feet): 50 Feet Assistive device: None Gait Pattern/deviations: Step-through pattern, Antalgic, Decreased stride length   Gait velocity interpretation: 1.31 - 2.62 ft/sec, indicative of limited community ambulator   General Gait Details: pt walking with OT on arrival and reporting significant back pain; returned to room with pt unable to ambulate farther due to pain  Stairs            Wheelchair Mobility     Tilt Bed    Modified Rankin (Stroke Patients Only)       Balance Overall balance assessment: Needs assistance Sitting-balance support: Feet supported Sitting balance-Leahy Scale: Good Sitting balance - Comments: in recliner no fully assed     Standing balance-Leahy Scale: Good               High level balance activites: Backward walking High Level Balance Comments: no issues with walking backwards >5 ft Standardized Balance Assessment Standardized Balance Assessment : Berg Balance Test Berg Balance Test Sit to Stand: Able to stand without using hands and stabilize independently Standing Unsupported: Able to stand safely 2 minutes Sitting with Back Unsupported but Feet Supported on Floor or Stool: Able to sit safely and securely  2 minutes Stand to Sit: Sits safely with minimal use of hands Transfers: Able to transfer safely, minor use of hands Standing Unsupported with Eyes Closed: Able to stand 10 seconds safely Standing Ubsupported with Feet Together: Able to place feet together independently and stand 1 minute safely From Standing, Reach Forward with Outstretched  Arm: Can reach confidently >25 cm (10") From Standing Position, Pick up Object from Floor: Able to pick up shoe safely and easily Standing on One Leg: Able to lift leg independently and hold 5-10 seconds         Pertinent Vitals/Pain Pain Assessment Pain Assessment: Faces Faces Pain Scale: Hurts even more Pain Location: back Pain Descriptors / Indicators: Aching, Discomfort, Sore Pain Intervention(s): Limited activity within patient's tolerance, Monitored during session, Patient requesting pain meds-RN notified    Home Living Family/patient expects to be discharged to:: Private residence Living Arrangements: Alone Available Help at Discharge: Friend(s);Available PRN/intermittently Type of Home: House Home Access: Level entry       Home Layout: One level Home Equipment: Agricultural consultant (2 wheels);Cane - single point;Shower seat;Wheelchair - manual Additional Comments: Pt very hard to make out his communication, reports he has all DME. Unable to make out full home setup but reports he does not live with anyone    Prior Function Prior Level of Function : Independent/Modified Independent             Mobility Comments: reports ind no AD ADLs Comments: reports ind     Extremity/Trunk Assessment   Upper Extremity Assessment Upper Extremity Assessment: Defer to OT evaluation RUE Deficits / Details: very weak gross grasp, rests hand with fingers flexed, not pulling them into extension without assist. sensation intact. decreased FMC LUE Deficits / Details: tingling of L palm and fingers    Lower Extremity Assessment Lower Extremity Assessment: Overall WFL for tasks assessed (RLE 5/5, denies numbness)    Cervical / Trunk Assessment Cervical / Trunk Assessment: Normal  Communication   Communication Communication: Difficulty communicating thoughts/reduced clarity of speech Cueing Techniques: Verbal cues  Cognition Arousal: Alert Behavior During Therapy: WFL for tasks  assessed/performed Overall Cognitive Status: Difficult to assess                                          General Comments General comments (skin integrity, edema, etc.): VSS on RA, Bp 182/106 while standing during gait    Exercises     Assessment/Plan    PT Assessment Patient needs continued PT services  PT Problem List Decreased activity tolerance;Decreased mobility;Decreased safety awareness;Pain       PT Treatment Interventions Gait training;Stair training;Functional mobility training;Therapeutic activities;Balance training;Patient/family education    PT Goals (Current goals can be found in the Care Plan section)  Acute Rehab PT Goals Patient Stated Goal: get better PT Goal Formulation: With patient Time For Goal Achievement: 04/27/23 Potential to Achieve Goals: Good    Frequency Min 1X/week     Co-evaluation               AM-PAC PT "6 Clicks" Mobility  Outcome Measure Help needed turning from your back to your side while in a flat bed without using bedrails?: None Help needed moving from lying on your back to sitting on the side of a flat bed without using bedrails?: None Help needed moving to and from a bed to a chair (including a wheelchair)?: None  Help needed standing up from a chair using your arms (e.g., wheelchair or bedside chair)?: None Help needed to walk in hospital room?: A Little Help needed climbing 3-5 steps with a railing? : A Little 6 Click Score: 22    End of Session Equipment Utilized During Treatment: Gait belt Activity Tolerance: Patient tolerated treatment well Patient left: in chair;with call bell/phone within reach;with chair alarm set Nurse Communication: Mobility status (pt lost balance with OT x 2, recommend chair alarm (and placed)) PT Visit Diagnosis: Unsteadiness on feet (R26.81);Other abnormalities of gait and mobility (R26.89)    Time: 3295-1884 PT Time Calculation (min) (ACUTE ONLY): 10 min   Charges:    PT Evaluation $PT Eval Low Complexity: 1 Low   PT General Charges $$ ACUTE PT VISIT: 1 Visit          Jerolyn Center, PT Acute Rehabilitation Services  Office [phone]   Zena Amos 04/13/2023, 1:10 PM

## 2023-04-13 NOTE — Progress Notes (Signed)
  Acute ischemic stroke: MRI finding: -IMPRESSION: 1. Multiple acute infarcts within the left hemisphere, affecting the MCA and PCA territories. 2. Petechial hemorrhage at these acute infarct sites. 3. Old right frontal infarct and findings of chronic small vessel ischemia.  -As MRI ruled out any major acute intracerebral bleeding starting DVT prophylaxis.  Tereasa Coop, MD [facility] 04/13/2023, 1:36 AM

## 2023-04-13 NOTE — Progress Notes (Signed)
Orthopedic Tech Progress Note Patient Details:  Dennis Hopkins Jan 14, 1961 [id number]  [facility] is aware of the order for a R resting hand splint and should be providing it today.   Patient ID: Dennis Hopkins, male   DOB: 1960/10/20, 63 y.o.   MRN: [id number]  Docia Furl 04/13/2023, 1:01 PM

## 2023-04-13 NOTE — Progress Notes (Signed)
Attempted Echocardiogram, Patient is not in the bed and waiting for patient care.

## 2023-04-13 NOTE — TOC Benefit Eligibility Note (Signed)
Patient Product/process development scientist completed.    The patient is insured through [facility]. Patient has [employer], may use a copay card, and/or apply for patient assistance if available.    Ran test claim for Brilinta 90 mg and the current 30 day co-pay is $100.00.   This test claim was processed through [facility]- copay amounts may vary at other pharmacies due to pharmacy/plan contracts, or as the patient moves through the different stages of their insurance plan.     Roland Earl, CPHT Pharmacy Technician III Certified Patient Advocate [facility] Pharm[facility] Patient Advocate Team Direct Number: [phone]  Fax: [phone]

## 2023-04-13 NOTE — Progress Notes (Signed)
PROGRESS NOTE                                                                                                                                                                                                             Patient Demographics:    Dennis Hopkins, is a 62 y.o. male, DOB - 30-Nov-1960, QMV:[id number]  Outpatient Primary MD for the patient is Donato Schultz, DO    LOS - 1  Admit date - 04/12/2023    Chief Complaint  Patient presents with   Facial Droop       Brief Narrative (HPI from H&P)    62 y.o. male with medical history significant of CAD status post CABG, hypertension, insulin-dependent DM type II, hyperlipidemia, aspirin allergy, chronic alcohol use, and obesity presented to [facility] emergency department with complaining of right-sided weakness and last known normal 10 PM 04/11/2023.  Patient presented with right-sided facial droop and right-sided upper extremity weakness.  Workup suggestive of left MCA and PCA territory acute infarct.   Subjective:    Dennis Hopkins today has, No headache, No chest pain, No abdominal pain - No Nausea, No new weakness tingling or numbness, shortness of breath continued right-sided weakness.   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  Does have some surrounding petechial hemorrhages around the infarct territory, CTA noted, currently on Plavix along with statin, Zetia and niacin for better LDL control, strictly counseled to abstain from alcohol.  Stroke service to see.  Echo pending, PT OT and speech.  Further per stroke team.   Essential hypertension - CVA 36 hours ago, blood pressure at times systolic above 220, low-dose Lopressor and as needed hydralazine.  Case discussed with stroke team Dr. Pearlean Brownie.   History of CAD status post CABG  - Logic to aspirin, currently on Plavix along with low-dose beta-blocker,  Crestor and  Zetia.   Hyperlipidemia -Continue niacin 2 Crestor and ezetimibe   Hypokalemia -Potassium 3.1. - Repleted with oral KCl 40 mEq 1 dose.   Peripheral neuropathy due to chronic diabetes -Continue gabapentin 300 mg 3 times daily - Continue fall precaution   Morbid obesity -BMI 33.63. -Counseled patient bedside for weight reduction, exercise and diet management   History of chronic alcohol use currently patient is sober. -  Drinks 3 to 4 glasses of hard liquor a day, counseled to quit, high chances of going into DTs, Librium plus CIWA protocol  Insulin-dependent DM type II -A1c 9.1 in August 2024 - Continue Semglee 40 unit in the a.m., low sliding scale insulin and bedtime insulin coverage as needed.  POC blood glucose check   CBG (last 3)  Recent Labs    04/12/23 1044 04/12/23 2216  GLUCAP 235* 231*   Lab Results  Component Value Date   HGBA1C 9.1 (H) 02/17/2023           Condition - Fair  Family Communication  :  None  Code Status :   Full  Consults  :  Neuro  PUD Prophylaxis :     Procedures  :     TTE -   MRI - 1. Multiple acute infarcts within the left hemisphere, affecting the MCA and PCA territories. 2. Petechial hemorrhage at these acute infarct sites. 3. Old right frontal infarct and findings of chronic small vessel ischemia.  CTA -  1. Patchy loss of gray-white differentiation in the high left frontal and parietal lobes, suspicious for acute left MCA territory infarcts. No acute hemorrhage. 2. No large vessel occlusion. 3. 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 Moderate to severe stenosis of the left ICA communicating segment, just proximal to the origin of the left posterior communicating artery. 4. Moderate to severe stenosis of the right ICA supraclinoid segment. 5. At least 80% stenosis of the proximal right cervical ICA. Approximately 70% stenosis of the proximal left cervical ICA. 6. Occlusion of the non dominant left vertebral artery at  its origin with reconstitution of the distal left V2 segment, likely via retrograde flow from the basilar artery. Aortic Atherosclerosis       Disposition Plan  :    Status is: Inpatient  DVT Prophylaxis  :    heparin injection 5,000 Units Start: 04/13/23 0600 SCD's Start: 04/12/23 1954    Lab Results  Component Value Date   PLT 163 04/13/2023    Diet :  Diet Order             Diet heart healthy/carb modified Room service appropriate? Yes; Fluid consistency: Thin  Diet effective now                    Inpatient Medications  Scheduled Meds:   stroke: early stages of recovery book   Does not apply Once   chlordiazePOXIDE  10 mg Oral TID   clopidogrel  75 mg Oral Daily   ezetimibe  10 mg Oral Daily   folic acid  1 mg Oral Daily   gabapentin  300 mg Oral TID   heparin injection (subcutaneous)  5,000 Units Subcutaneous Q8H   insulin aspart  0-5 Units Subcutaneous QHS   insulin aspart  0-6 Units Subcutaneous TID WC   insulin glargine-yfgn  40 Units Subcutaneous Daily   LORazepam  0-4 mg Intravenous Q6H   Followed by   Melene Muller ON 04/15/2023] LORazepam  0-4 mg Intravenous Q12H   metoprolol tartrate  25 mg Oral BID   multivitamin with minerals  1 tablet Oral Daily   niacin  100 mg Oral BID WC   potassium chloride  40 mEq Oral Once   rosuvastatin  40 mg Oral Daily   thiamine  100 mg Oral Daily   Or   thiamine  100 mg Intravenous Daily   Continuous Infusions: PRN Meds:.acetaminophen **OR** acetaminophen (TYLENOL) oral  liquid 160 mg/5 mL **OR** acetaminophen, hydrALAZINE, LORazepam **OR** LORazepam, senna-docusate  Antibiotics  :    Anti-infectives (From admission, onward)    None         Objective:   Vitals:   04/13/23 0115 04/13/23 0200 04/13/23 0400 04/13/23 0836  BP: (!) 189/93  (!) 223/108 (!) 211/94  Pulse:   100   Resp: (!) 25 (!) 25 17 (!) 25  Temp: 97.9 F (36.6 C)  99 F (37.2 C) 98.8 F (37.1 C)  TempSrc: Oral  Oral Oral  SpO2:   97%    Weight:      Height:        Wt Readings from Last 3 Encounters:  04/12/23 106.3 kg  02/17/23 106.2 kg  09/08/22 111.7 kg     Intake/Output Summary (Last 24 hours) at 04/13/2023 0908 Last data filed at 04/12/2023 1410 Gross per 24 hour  Intake 999.13 ml  Output --  Net 999.13 ml     Physical Exam  Awake Alert, No new F.N deficits, mild right-sided hemiparesis arm more than leg [place].AT,PERRAL Supple Neck, No JVD,   Symmetrical Chest wall movement, Good air movement bilaterally, CTAB RRR,No Gallops,Rubs or new Murmurs,  +ve B.Sounds, Abd Soft, No tenderness,   No Cyanosis, Clubbing or edema     Data Review:    Recent Labs  Lab 04/12/23 1054 04/13/23 0620  WBC 6.7 7.6  HGB 15.4 15.0  HCT 45.3 44.3  PLT 161 163  MCV 86.9 85.2  MCH 29.6 28.8  MCHC 34.0 33.9  RDW 13.0 12.8  LYMPHSABS 2.4  --   MONOABS 0.6  --   EOSABS 0.3  --   BASOSABS 0.1  --     Recent Labs  Lab 04/12/23 1054 04/13/23 0620  NA 137 137  K 3.1* 3.5  CL 98 101  CO2 23 20*  ANIONGAP 16* 16*  GLUCOSE 241* 253*  BUN 13 9  CREATININE 1.07 0.92  AST 44* 38  ALT 56* 50*  ALKPHOS 72 63  BILITOT 1.7* 1.4*  ALBUMIN 4.1 3.8  CALCIUM 9.1 9.2      Recent Labs  Lab 04/12/23 1054 04/13/23 0620  CALCIUM 9.1 9.2    --------------------------------------------------------------------------------------------------------------- Lab Results  Component Value Date   CHOL 221 (H) 04/13/2023   HDL 38 (L) 04/13/2023   LDLCALC 162 (H) 04/13/2023   LDLDIRECT 173.0 02/07/2022   TRIG 107 04/13/2023   CHOLHDL 5.8 04/13/2023    Lab Results  Component Value Date   HGBA1C 9.1 (H) 02/17/2023   No results for input(s): "TSH", "T4TOTAL", "FREET4", "T3FREE", "THYROIDAB" in the last 72 hours. No results for input(s): "VITAMINB12", "FOLATE", "FERRITIN", "TIBC", "IRON", "RETICCTPCT" in the last 72  hours. ------------------------------------------------------------------------------------------------------------------ Cardiac Enzymes No results for input(s): "CKMB", "TROPONINI", "MYOGLOBIN" in the last 168 hours.  Invalid input(s): "CK"  Micro Results No results found for this or any previous visit (from the past 240 hour(s)).  Radiology Reports MR BRAIN WO CONTRAST  Result Date: 04/13/2023 CLINICAL DATA:  Stroke follow-up.  Abnormal head CT. EXAM: MRI HEAD WITHOUT CONTRAST TECHNIQUE: Multiplanar, multiecho pulse sequences of the brain and surrounding structures were obtained without intravenous contrast. COMPARISON:  CTA head neck 04/12/2023 FINDINGS: Brain: Multifocal abnormal diffusion restriction within the left hemisphere, affecting the MCA and PCA territories. There is petechial hemorrhage throughout most of these sites of acute ischemia, with surrounding cytotoxic edema. There is multifocal hyperintense T2-weighted signal within the white matter. Parenchymal volume and CSF spaces are normal.  Old right frontal infarct. The midline structures are normal. Vascular: Normal flow voids. Skull and upper cervical spine: Normal calvarium and skull base. Visualized upper cervical spine and soft tissues are normal. Sinuses/Orbits:Mild sphenoid and right maxillary sinus mucosal thickening. Normal orbits. IMPRESSION: 1. Multiple acute infarcts within the left hemisphere, affecting the MCA and PCA territories. 2. Petechial hemorrhage at these acute infarct sites. 3. Old right frontal infarct and findings of chronic small vessel ischemia. Electronically Signed   By: Deatra Robinson M.D.   On: 04/13/2023 01:21   CT ANGIO HEAD NECK W WO CM  Result Date: 04/12/2023 CLINICAL DATA:  RUE weakness/numb, facial droop. EXAM: CT ANGIOGRAPHY HEAD AND NECK WITH AND WITHOUT CONTRAST TECHNIQUE: Multidetector CT imaging of the head and neck was performed using the standard protocol during bolus administration of  intravenous contrast. Multiplanar CT image reconstructions and MIPs were obtained to evaluate the vascular anatomy. Carotid stenosis measurements (when applicable) are obtained utilizing NASCET criteria, using the distal internal carotid diameter as the denominator. RADIATION DOSE REDUCTION: This exam was performed according to the departmental dose-optimization program which includes automated exposure control, adjustment of the mA and/or kV according to patient size and/or use of iterative reconstruction technique. CONTRAST:  75mL OMNIPAQUE IOHEXOL 350 MG/ML SOLN COMPARISON:  None Available. FINDINGS: CT HEAD FINDINGS Brain: No acute hemorrhage. Patchy loss of gray-white differentiation in the high left frontal and parietal lobes, suspicious for acute infarcts. Old infarct in the right middle frontal gyrus. No hydrocephalus or extra-axial collection. No mass effect or midline shift. Vascular: No hyperdense vessel or unexpected calcification. Skull: No calvarial fracture or suspicious bone lesion. Skull base is unremarkable. Sinuses/Orbits: Near-complete opacification of the left sphenoid sinus. Other: None. Review of the MIP images confirms the above findings CTA NECK FINDINGS Aortic arch: Three-vessel arch configuration. Ulcerated atherosclerotic plaque along the aortic arch. Postoperative changes from prior median sternotomy and CABG. Arch vessel origins are patent. Right carotid system: At least 80% stenosis of the proximal right cervical ICA. Precise measurement is limited by blooming artifact from adjacent calcified plaque. Left carotid system: Approximately 70% stenosis of the proximal left cervical ICA. Vertebral arteries: Right dominant. The non dominant left vertebral artery is occluded at its origin with reconstitution of the distal left V2 segment, likely via retrograde flow from the basilar artery. Skeleton: Multilevel cervical spondylosis, worst at C4-5, where there is at least mild spinal canal  stenosis. Other neck: Unremarkable. Upper chest: Unremarkable. 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 Moderate to severe stenosis of the left ICA communicating segment, just proximal to the origin of the left posterior communicating artery. Moderate to severe stenosis of the right ICA supraclinoid segment. The proximal ACAs and MCAs are patent without stenosis or aneurysm. Distal branches are symmetric. Posterior circulation: Normal basilar artery. The SCAs, AICAs and PICAs are patent proximally. The PCAs are patent proximally without stenosis or aneurysm. Distal branches are symmetric. Venous sinuses: As permitted by contrast timing, patent. Anatomic variants: None. Review of the MIP images confirms the above findings IMPRESSION: 1. Patchy loss of gray-white differentiation in the high left frontal and parietal lobes, suspicious for acute left MCA territory infarcts. No acute hemorrhage. 2. No large vessel occlusion. 3. 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 Moderate to severe stenosis of the left ICA communicating segment, just proximal to the origin of the left posterior communicating artery. 4. Moderate to severe stenosis  of the right ICA supraclinoid segment. 5. At least 80% stenosis of the proximal right cervical ICA. Approximately 70% stenosis of the proximal left cervical ICA. 6. 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 Aortic Atherosclerosis (ICD10-I70.0). Electronically Signed   By: Orvan Falconer M.D.   On: 04/12/2023 12:39      Signature  -   Susa Raring M.D on 04/13/2023 at 9:08 AM   -  To page go to [email or web address]

## 2023-04-14 ENCOUNTER — Inpatient Hospital Stay (HOSPITAL_COMMUNITY): Payer: PRIVATE HEALTH INSURANCE

## 2023-04-14 DIAGNOSIS — I6389 Other cerebral infarction: Secondary | ICD-10-CM

## 2023-04-14 DIAGNOSIS — I639 Cerebral infarction, unspecified: Secondary | ICD-10-CM | POA: Diagnosis not present

## 2023-04-14 LAB — GLUCOSE, CAPILLARY
Glucose-Capillary: 216 mg/dL — ABNORMAL HIGH (ref 70–99)
Glucose-Capillary: 153 mg/dL — ABNORMAL HIGH (ref 70–99)
Glucose-Capillary: 200 mg/dL — ABNORMAL HIGH (ref 70–99)

## 2023-04-14 LAB — ECHOCARDIOGRAM COMPLETE BUBBLE STUDY
Area-P 1/2: 3.99 cm2
Calc EF: 81.8 %
Est EF: >75
S' Lateral: 4.5 cm
Single Plane A2C EF: 78.4 %
Single Plane A4C EF: 83.9 %

## 2023-04-14 LAB — BASIC METABOLIC PANEL
Anion gap: 10 (ref 5–15)
BUN: 11 mg/dL (ref 8–23)
CO2: 23 mmol/L (ref 22–32)
Calcium: 9.3 mg/dL (ref 8.9–10.3)
Chloride: 103 mmol/L (ref 98–111)
Creatinine, Ser: 0.81 mg/dL (ref 0.61–1.24)
GFR, Estimated: >60 mL/min (ref >60)
Glucose, Bld: 199 mg/dL — ABNORMAL HIGH (ref 70–99)
Potassium: 3.7 mmol/L (ref 3.5–5.1)
Sodium: 136 mmol/L (ref 135–145)

## 2023-04-14 LAB — MAGNESIUM: Magnesium: 1.9 mg/dL (ref 1.7–2.4)

## 2023-04-14 LAB — PHOSPHORUS: Phosphorus: 2.7 mg/dL (ref 2.5–4.6)

## 2023-04-14 MED ORDER — PERFLUTREN LIPID MICROSPHERE
1.0000 mL | INTRAVENOUS | Status: AC | PRN
  Administered 2023-04-14: 6 mL via INTRAVENOUS

## 2023-04-14 NOTE — Progress Notes (Signed)
  Echocardiogram 2D Echocardiogram has been performed.  Dennis Hopkins 04/14/2023, 8:53 AM

## 2023-04-14 NOTE — Progress Notes (Signed)
Occupational Therapy Treatment Patient Details Name: Dennis [facility] MRN: [id number] DOB: 05-Aug-1960 Today's Date: 04/14/2023   History of present illness 62 y.o. male presented to emergency department 04/12/23 with complaining of right-sided weakness; CTA head which showed left MCA territory infarction along with ICA severe stenosis as well as occlusion of the left vertebral artery. MRI multiple acute infarcts L MCA and PCA territories, old R frontal infarct PMH significant of CAD status post CABG, hypertension, insulin-dependent DM type II, hyperlipidemia, aspirin allergy, chronic alcohol use, and obesity   OT comments  Reassessed pt for visual challenges and capacity to manage medication independently. Functional cognition further assessed with The Pillbox Test: A Measure of Executive Functioning and Estimate of Medication Management. A straight pass/fail designation is determined by 3 or more errors of omission or misplacement on the task. Pt had a total of 5 errors.and failed the assessment, demonstrating deficits with planning, mental flexibility, suboptimal search strategies, concrete thinking and difficulty with multitasking.  Errors: One tablet 3x/day - 3 errors (omission)-initially missed the whole pillbox before cued One tablet 2x/day with breakfast and dinner - 1 errors (misplacement) One tablet in the morning - 1 errors (misplacement) One tablet daily at bedtime  - 0 errors One tablet every other day - 0 errors     Total time to complete task  - 6 min 32  Minor visual challenges noted, nothing that seems to be significantly impacting ADL/iADL performance. Reached out to social work and C[facility] to discuss medication management options if pt were to DC without home assist. OT to continue following pt while in acute stay.        If plan is discharge home, recommend the following:  Assistance with cooking/housework   Equipment Recommendations  None recommended by OT (pt reports he  has needed DME)    Recommendations for Other Services      Precautions / Restrictions Precautions Precautions: Fall Restrictions Weight Bearing Restrictions: No       Mobility Bed Mobility                    Transfers                         Balance                                           ADL either performed or assessed with clinical judgement   ADL                                         General ADL Comments: Focused session on visual assessments and performed pillbox test to assess medication management skills    Extremity/Trunk Assessment              Vision Baseline Vision/History: 1 Wears glasses Additional Comments: Reassessed vision, multiple assessments with L eye open and L eye occuluded. Pt R visual field noted to have a 15-20* angle deficit from his L. With L eye occluded pt continued to identify # of therapists fingers correctly, During letter cancellation test pt made a total of 5 errors (missed 3 E's and crossed 2 B's) out of a total of 28 that were on the paper. Pt reading L<>R without visual challenge, takes increased  time. During pillbox assessment pt missed one container that was located in his RLQ, he reports he just was not paying attention thats why he missed it, located the other 2 containers on R side without problems. He was reading out the labels aloud without challenge.   Perception     Praxis      Cognition Arousal: Alert Behavior During Therapy: WFL for tasks assessed/performed Overall Cognitive Status: Within Functional Limits for tasks assessed                                 General Comments: Pill box test administered, pt failed the test due to missing particular days that pills should be taken, placing pills within that are meant for tuesday (or another day) in the "noon" or "evening" slot.        Exercises      Shoulder Instructions       General  Comments      Pertinent Vitals/ Pain       Pain Assessment Pain Assessment: Faces Faces Pain Scale: Hurts even more Pain Location: back Pain Descriptors / Indicators: Discomfort, Grimacing Pain Intervention(s): Monitored during session, Limited activity within patient's tolerance  Home Living                                          Prior Functioning/Environment              Frequency  Min 1X/week        Progress Toward Goals  OT Goals(current goals can now be found in the care plan section)  Progress towards OT goals: Progressing toward goals  Acute Rehab OT Goals OT Goal Formulation: With patient Time For Goal Achievement: 04/27/23 Potential to Achieve Goals: Good  Plan      Co-evaluation                 AM-PAC OT "6 Clicks" Daily Activity     Outcome Measure   Help from another person eating meals?: None Help from another person taking care of personal grooming?: A Little Help from another person toileting, which includes using toliet, bedpan, or urinal?: A Little Help from another person bathing (including washing, rinsing, drying)?: A Little Help from another person to put on and taking off regular upper body clothing?: A Little Help from another person to put on and taking off regular lower body clothing?: A Lot 6 Click Score: 18    End of Session    OT Visit Diagnosis: Unsteadiness on feet (R26.81);Muscle weakness (generalized) (M62.81)   Activity Tolerance Patient tolerated treatment well   Patient Left in chair;with call bell/phone within reach;with chair alarm set   Nurse Communication Mobility status        Time: 6606-3016 OT Time Calculation (min): 35 min  Charges: OT General Charges $OT Visit: 1 Visit OT Treatments $Therapeutic Activity: 23-37 mins  04/14/2023  AB, OTR/L  Acute Rehabilitation Services  Office: [phone]   Tristan Schroeder 04/14/2023, 1:54 PM

## 2023-04-14 NOTE — Progress Notes (Signed)
Mobility Specialist Progress Note;   04/14/23 1400  Mobility  Activity Ambulated with assistance in hallway  Level of Assistance Standby assist, set-up cues, supervision of patient - no hands on  Assistive Device None  Distance Ambulated (ft) 375 ft  Activity Response Tolerated well  Mobility Referral Yes  $Mobility charge 1 Mobility  Mobility Specialist Start Time (ACUTE ONLY) 1400  Mobility Specialist Stop Time (ACUTE ONLY) 1420  Mobility Specialist Time Calculation (min) (ACUTE ONLY) 20 min   Pt agreeable to mobility. Required no physical assistance during ambulation, SV. Asx throughout session. No LOB displayed during session but did run R side of body into door frame and chair in room at [facility]. Pt back in bed with all needs met.   Caesar Bookman Mobility Specialist Please contact via SecureChat or Rehab Office [phone]

## 2023-04-14 NOTE — Progress Notes (Signed)
Physical Therapy Treatment Patient Details Name: Dennis [facility] MRN: [id number] DOB: [birth year] Today's Date: 04/14/2023   History of Present Illness 62 y.o. male presented to emergency department 04/12/23 with complaining of right-sided weakness; CTA head which showed left MCA territory infarction along with ICA severe stenosis as well as occlusion of the left vertebral artery. MRI multiple acute infarcts L MCA and PCA territories, old R frontal infarct PMH significant of CAD status post CABG, hypertension, insulin-dependent DM type II, hyperlipidemia, aspirin allergy, chronic alcohol use, and obesity    PT Comments  Pt independent transfers. Supervision amb 300' without AD. No LOB noted. He demo good sitting and standing balance. He demo good ability to maneuver around obstacles on R and L. Pt in recliner at end of session. PT to follow acutely. No follow up PT services indicated.     If plan is discharge home, recommend the following: Assistance with cooking/housework;Assist for transportation   Can travel by private vehicle        Equipment Recommendations  None recommended by PT    Recommendations for Other Services       Precautions / Restrictions Precautions Precautions: Fall     Mobility  Bed Mobility               General bed mobility comments: up in recliner on arrival    Transfers Overall transfer level: Independent Equipment used: None                    Ambulation/Gait Ambulation/Gait assistance: Supervision Gait Distance (Feet): 300 Feet Assistive device: None Gait Pattern/deviations: Step-through pattern Gait velocity: WFL Gait velocity interpretation: >2.62 ft/sec, indicative of community ambulatory   General Gait Details: no difficulty managing obstacles on R   Stairs             Wheelchair Mobility     Tilt Bed    Modified Rankin (Stroke Patients Only) Modified Rankin (Stroke Patients Only) Pre-Morbid Rankin Score:  Slight disability Modified Rankin: Slight disability     Balance Overall balance assessment: Needs assistance Sitting-balance support: Feet supported, No upper extremity supported Sitting balance-Leahy Scale: Good     Standing balance support: No upper extremity supported, During functional activity Standing balance-Leahy Scale: Good                              Cognition Arousal: Alert Behavior During Therapy: WFL for tasks assessed/performed Overall Cognitive Status: Within Functional Limits for tasks assessed                                          Exercises      General Comments        Pertinent Vitals/Pain Pain Assessment Pain Assessment: Faces Faces Pain Scale: Hurts even more Pain Location: back Pain Descriptors / Indicators: Discomfort, Grimacing Pain Intervention(s): Monitored during session, Patient requesting pain meds-RN notified    Home Living                          Prior Function            PT Goals (current goals can now be found in the care plan section) Acute Rehab PT Goals Patient Stated Goal: home Progress towards PT goals: Progressing toward goals    Frequency  Min 1X/week      PT Plan      Co-evaluation              AM-PAC PT "6 Clicks" Mobility   Outcome Measure  Help needed turning from your back to your side while in a flat bed without using bedrails?: None Help needed moving from lying on your back to sitting on the side of a flat bed without using bedrails?: None Help needed moving to and from a bed to a chair (including a wheelchair)?: None Help needed standing up from a chair using your arms (e.g., wheelchair or bedside chair)?: None Help needed to walk in hospital room?: A Little Help needed climbing 3-5 steps with a railing? : A Little 6 Click Score: 22    End of Session Equipment Utilized During Treatment: Gait belt Activity Tolerance: Patient tolerated treatment  well Patient left: in chair;with call bell/phone within reach;with chair alarm set Nurse Communication: Patient requests pain meds PT Visit Diagnosis: Unsteadiness on feet (R26.81);Other abnormalities of gait and mobility (R26.89)     Time: 1030-1043 PT Time Calculation (min) (ACUTE ONLY): 13 min  Charges:    $Gait Training: 8-22 mins PT General Charges $$ ACUTE PT VISIT: 1 Visit                     Ferd Glassing., PT  Office # [phone]    Ilda Foil 04/14/2023, 10:55 AM

## 2023-04-14 NOTE — Plan of Care (Signed)
  Problem: Education: Goal: Knowledge of General Education information will improve Description: Including pain rating scale, medication(s)/side effects and non-pharmacologic comfort measures Outcome: Progressing   Problem: Health Behavior/Discharge Planning: Goal: Ability to manage health-related needs will improve Outcome: Progressing   Problem: Education: Goal: Ability to describe self-care measures that may prevent or decrease complications (Diabetes Survival Skills Education) will improve Outcome: Progressing   Problem: Education: Goal: Knowledge of disease or condition will improve Outcome: Progressing Goal: Knowledge of secondary prevention will improve (MUST DOCUMENT ALL) Outcome: Progressing Goal: Knowledge of patient specific risk factors will improve Loraine Leriche N/A or DELETE if not current risk factor) Outcome: Progressing   Problem: Ischemic Stroke/TIA Tissue Perfusion: Goal: Complications of ischemic stroke/TIA will be minimized Outcome: Progressing   Problem: Coping: Goal: Will verbalize positive feelings about self Outcome: Progressing Goal: Will identify appropriate support needs Outcome: Progressing   Problem: Health Behavior/Discharge Planning: Goal: Ability to manage health-related needs will improve Outcome: Progressing Goal: Goals will be collaboratively established with patient/family Outcome: Progressing   Problem: Self-Care: Goal: Ability to participate in self-care as condition permits will improve Outcome: Progressing Goal: Verbalization of feelings and concerns over difficulty with self-care will improve Outcome: Progressing Goal: Ability to communicate needs accurately will improve Outcome: Progressing   Problem: Nutrition: Goal: Risk of aspiration will decrease Outcome: Progressing Goal: Dietary intake will improve Outcome: Progressing

## 2023-04-14 NOTE — Evaluation (Signed)
Speech Language Pathology Evaluation Patient Details Name: Dennis Hopkins MRN: [id number] DOB: [birth year] Today's Date: 04/14/2023 Time: 8315-1761 SLP Time Calculation (min) (ACUTE ONLY): 15 min  Problem List:  Patient Active Problem List   Diagnosis Date Noted   Acute CVA (cerebrovascular accident) (HCC) 04/12/2023   History of CAD (coronary artery disease) 04/12/2023   Chronic alcohol use 04/12/2023   Hypokalemia 04/12/2023   Morbid obesity (HCC) 04/12/2023   Peripheral neuropathy 04/12/2023   Eczema 04/12/2022   Lumbar foraminal stenosis 01/05/2022   Type 2 diabetes mellitus with diabetic peripheral angiopathy without gangrene, without long-term current use of insulin (HCC) 09/06/2021   Calcified granuloma of lung 09/06/2021   Peripheral neuropathic pain 09/06/2021   Type 2 diabetes mellitus with hyperglycemia, without long-term current use of insulin (HCC) 04/13/2021   Diabetes mellitus (HCC) 04/13/2021   Essential hypertension    Hyperlipidemia    Chickenpox    Urinary frequency 08/24/2020   Snoring 08/24/2020   Rash 08/24/2020   Kidney stones 02/14/2019   Acute left-sided low back pain with right-sided sciatica 02/14/2019   Insulin dependent type 2 diabetes mellitus (HCC) 07/19/2016   Diabetes (HCC) 01/18/2016   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   Coronary artery disease 01/12/2016   Exertional angina (HCC)    Chest pain 11/11/2015   HTN (hypertension) 10/20/2015   Hyperlipidemia LDL goal <70 10/20/2015   Preventative health care 09/29/2014   Obesity (BMI 30-39.9) 09/26/2013   Tobacco use disorder 09/26/2013   Past Medical History:  Past Medical History:  Diagnosis Date   Chickenpox    Hyperlipemia    Hypertension    Kidney stones 2013   Past Surgical History:  Past Surgical History:  Procedure Laterality Date   CARDIAC CATHETERIZATION N/A 01/07/2016   Procedure: Left Heart Cath and Coronary Angiography;  Surgeon: Corky Crafts, MD;  Location: [facility] INVASIVE CV  LAB;  Service: Cardiovascular;  Laterality: N/A;   CORONARY ARTERY BYPASS GRAFT N/A 01/13/2016   Procedure: CORONARY ARTERY BYPASS GRAFTING (CABG) x4 Endoscopic Harvesting of the Right Greater Saphenous Vein;  Surgeon: Loreli Slot, MD;  Location: [facility];  Service: Open Heart Surgery;  Laterality: N/A;   TEE WITHOUT CARDIOVERSION N/A 01/13/2016   Procedure: TRANSESOPHAGEAL ECHOCARDIOGRAM (TEE);  Surgeon: Loreli Slot, MD;  Location: [facility];  Service: Open Heart Surgery;  Laterality: N/A;   TONSILLECTOMY     HPI:  Dennis Hopkins is a 62 y.o. male with hx of HTN, HLD, CAD s/p CABG, nephrolithiasis who presents with R sided weakness. He was found to have scattered L MCA stroke with severe L ICA cavernous segment stenosis in addition to 70% proximal L ICA stenosis.   Assessment / Plan / Recommendation Clinical Impression  Pt presents with a moderate dysarthria of speech, impacting intelligibility.  He required cues to slow output down by approx 20%, over-exaggerate consonants, and put a subtle space between each word. When cued, he could follow-through, but needed frequent reminders, otherwise speech was rapid and consonant production deteriorated.  Expressive language was intact. He followed complex commands and answered yes/no questions reliably. He was able to find information from menu and problem-solve through simple menu/call bell situations.  We reviewed dx of dysarthria and the importance of f/u speech therapy; affect was flat and he had few questions.  Pt states he may be D/Cd home today. Recommend initial supervision at home given poor speech intelligibility and impaired recognition of communication break-down, impaired initiation to repair.    SLP  Assessment  SLP Recommendation/Assessment: Patient needs continued Speech Lanaguage Pathology Services SLP Visit Diagnosis: Dysarthria and anarthria (R47.1)    Recommendations for follow up therapy are one component of a  multi-disciplinary discharge planning process, led by the attending physician.  Recommendations may be updated based on patient status, additional functional criteria and insurance authorization.    Follow Up Recommendations  Outpatient SLP    Assistance Recommended at Discharge  Intermittent Supervision/Assistance  Functional Status Assessment Patient has had a recent decline in their functional status and demonstrates the ability to make significant improvements in function in a reasonable and predictable amount of time.  Frequency and Duration min 2x/week  1 week      SLP Evaluation Cognition  Overall Cognitive Status: Within Functional Limits for tasks assessed (testing primarily focused on language/speech) Arousal/Alertness: Awake/alert Orientation Level: Oriented X4 Attention: Sustained Sustained Attention: Appears intact Problem Solving: Appears intact (for menu/call bell use but not complex tasks)       Comprehension  Auditory Comprehension Yes/No Questions: Within Functional Limits Commands: Within Functional Limits Visual Recognition/Discrimination Discrimination: Within Function Limits Reading Comprehension Reading Status: Within funtional limits    Expression Expression Primary Mode of Expression: Verbal Verbal Expression Initiation: No impairment Level of Generative/Spontaneous Verbalization: Sentence Repetition: No impairment Naming: No impairment Pragmatics: No impairment Written Expression Dominant Hand: Right Written Expression: Not tested   Oral / Motor  Oral Motor/Sensory Function Overall Oral Motor/Sensory Function: Moderate impairment Facial Symmetry: Abnormal symmetry right;Suspected CN VII (facial) dysfunction Facial Strength: Reduced right;Suspected CN VII (facial) dysfunction Facial Sensation: Reduced right;Suspected CN V (Trigeminal) dysfunction Lingual Symmetry: Abnormal symmetry right;Suspected CN XII (hypoglossal) dysfunction Motor  Speech Overall Motor Speech: Impaired Phonation: Normal Resonance: Hypernasality Articulation: Impaired Intelligibility: Intelligibility reduced Word: 50-74% accurate Phrase: 50-74% accurate Sentence: 50-74% accurate Conversation: 50-74% accurate Motor Speech Errors: Not applicable            Dennis Hopkins 04/14/2023, 2:41 PM Dennis Trick L. Samson Frederic, MA CCC/SLP Clinical Specialist - Acute Care SLP Acute Rehabilitation Services Office number [phone]

## 2023-04-14 NOTE — Progress Notes (Signed)
STROKE TEAM PROGRESS NOTE   BRIEF HPI [name] is a 62 y.o. male with history of HTN, HLD, CAD s/p CABG, nephrolithiasis who presents with R sided weakness. Symptoms started around 2200 on 04/11/23.   INTERIM HISTORY/SUBJECTIVE Patient is sitting in bedside chair.  Dysarthria and right hand weakness is improving but not fully recovered yet.  He is tolerating Brilinta well without side effects.  He has no new complaints.  Vital signs stable.  OBJECTIVE  CBC    Component Value Date/Time   WBC 7.6 04/13/2023 0620   RBC 5.20 04/13/2023 0620   HGB 15.0 04/13/2023 0620   HCT 44.3 04/13/2023 0620   PLT 163 04/13/2023 0620   MCV 85.2 04/13/2023 0620   MCH 28.8 04/13/2023 0620   MCHC 33.9 04/13/2023 0620   RDW 12.8 04/13/2023 0620   LYMPHSABS 2.4 04/12/2023 1054   MONOABS 0.6 04/12/2023 1054   EOSABS 0.3 04/12/2023 1054   BASOSABS 0.1 04/12/2023 1054    BMET    Component Value Date/Time   NA 136 04/14/2023 0335   NA 139 09/23/2020 0000   K 3.7 04/14/2023 0335   CL 103 04/14/2023 0335   CO2 23 04/14/2023 0335   GLUCOSE 199 (H) 04/14/2023 0335   BUN 11 04/14/2023 0335   BUN 24 (A) 09/23/2020 0000   CREATININE 0.81 04/14/2023 0335   CREATININE 1.04 02/17/2023 1344   CALCIUM 9.3 04/14/2023 0335   EGFR 76 09/14/2020 1514   GFRNONAA >60 04/14/2023 0335    IMAGING past 24 hours ECHOCARDIOGRAM COMPLETE BUBBLE STUDY  Result Date: 04/14/2023    ECHOCARDIOGRAM REPORT   Patient Name:   Dennis Hopkins Date of Exam: 04/14/2023 Medical Rec #:  [id number]       Height:       70.0 in Accession #:    [id number]      Weight:       234.3 lb Date of Birth:  [birth year]       BSA:          2.233 m Patient Age:    62 years        BP:           138/70 mmHg Patient Gender: M               HR:           88 bpm. Exam Location:  Inpatient Procedure: 2D Echo, Cardiac Doppler, Color Doppler, Saline Contrast Bubble Study            and Intracardiac Opacification Agent Indications:    CVA   History:        Patient has prior history of Echocardiogram examinations, most                 recent 10/21/2020. Prior CABG, Arrythmias:PVC; Risk                 Factors:Hypertension, Diabetes and Dyslipidemia.  Sonographer:    Karma Ganja Referring Phys: Tereasa Coop  Sonographer Comments: Technically difficult study due to poor echo windows, patient is obese and suboptimal parasternal window. Image acquisition challenging due to patient body habitus. IMPRESSIONS  1. Left ventricular ejection fraction, by estimation, is >75%. The left ventricle has hyperdynamic function. The left ventricle has no regional wall motion abnormalities. Left ventricular diastolic function could not be evaluated due to poor image acquisition.  2. Right ventricular systolic function grossly normal. The right ventricular size is grossly normal. Tricuspid regurgitation signal  is inadequate for assessing PA pressure.  3. Left atrial size was mildly dilated.  4. The mitral valve is normal in structure. No evidence of mitral valve regurgitation. No evidence of mitral stenosis.  5. The aortic valve was not well visualized. Unable to determine aortic valve morphology due to image quality. Aortic valve regurgitation is not visualized. No aortic stenosis is present.  6. The inferior vena cava is normal in size with greater than 50% respiratory variability, suggesting right atrial pressure of 3 mmHg.  7. Poor image quality but agitated saline contrast bubble study was grossly negative, with no evidence of any interatrial shunt. If clinically indicated would recommend trans cranial doppler or TEE. Comparison(s): A prior study was performed on 10/21/2020. LVEF was 55-60% and now hyperdynamic, unable to comment on diastolic function on current study. Conclusion(s)/Recommendation(s): No intracardiac source of embolism detected on this transthoracic study. Consider a transesophageal echocardiogram to exclude cardiac source of embolism if clinically  indicated. FINDINGS  Left Ventricle: Left ventricular ejection fraction, by estimation, is >75%. The left ventricle has hyperdynamic function. The left ventricle has no regional wall motion abnormalities. Definity contrast agent was given IV to delineate the left ventricular endocardial borders. The left ventricular internal cavity size was normal in size. There is borderline left ventricular hypertrophy. Left ventricular diastolic function could not be evaluated due to poor image acquisition. Right Ventricle: The right ventricular size is grossly normal. Right vetricular wall thickness was not well visualized. Right ventricular systolic function grossly normal. Tricuspid regurgitation signal is inadequate for assessing PA pressure. Left Atrium: Left atrial size was mildly dilated. Right Atrium: Right atrial size was not well visualized. Pericardium: There is no evidence of pericardial effusion. Mitral Valve: The mitral valve is normal in structure. No evidence of mitral valve regurgitation. No evidence of mitral valve stenosis. Tricuspid Valve: The tricuspid valve is grossly normal. Tricuspid valve regurgitation is not demonstrated. No evidence of tricuspid stenosis. Aortic Valve: The aortic valve was not well visualized. Aortic valve regurgitation is not visualized. No aortic stenosis is present. Pulmonic Valve: The pulmonic valve was not well visualized. Pulmonic valve regurgitation is trivial. Aorta: The ascending aorta was not well visualized and the aortic root is normal in size and structure. Venous: The inferior vena cava is normal in size with greater than 50% respiratory variability, suggesting right atrial pressure of 3 mmHg. IAS/Shunts: The atrial septum is grossly normal. Agitated saline contrast was given intravenously to evaluate for intracardiac shunting. Poor image quality but agitated saline contrast bubble study was grossly negative, with no evidence of any interatrial shunt. If clinically  indicated would recommend trans cranial doppler or TEE.  LEFT VENTRICLE PLAX 2D LVIDd:         5.30 cm     Diastology LVIDs:         4.50 cm     LV e' medial:    3.70 cm/s LV PW:         1.20 cm     LV E/e' medial:  17.0 LV IVS:        1.20 cm     LV e' lateral:   5.87 cm/s LVOT diam:     2.10 cm     LV E/e' lateral: 10.7 LV SV:         47 LV SV Index:   21 LVOT Area:     3.46 cm  LV Volumes (MOD) LV vol d, MOD A2C: 25.1 ml LV vol d, MOD A4C: 55.7 ml  LV vol s, MOD A2C: 5.4 ml LV vol s, MOD A4C: 9.0 ml LV SV MOD A2C:     19.7 ml LV SV MOD A4C:     55.7 ml LV SV MOD BP:      31.9 ml RIGHT VENTRICLE         IVC TAPSE (M-mode): 1.9 cm  IVC diam: 1.70 cm LEFT ATRIUM             Index        RIGHT ATRIUM           Index LA diam:        4.30 cm 1.93 cm/m   RA Area:     12.90 cm LA Vol (A2C):   87.8 ml 39.32 ml/m  RA Volume:   30.20 ml  13.53 ml/m LA Vol (A4C):   69.8 ml 31.26 ml/m LA Biplane Vol: 79.9 ml 35.79 ml/m  AORTIC VALVE LVOT Vmax:   82.30 cm/s LVOT Vmean:  51.800 cm/s LVOT VTI:    0.136 m  AORTA Ao Root diam: 3.70 cm MITRAL VALVE MV Area (PHT): 3.99 cm     SHUNTS MV Decel Time: 190 msec     Systemic VTI:  0.14 m MV E velocity: 63.00 cm/s   Systemic Diam: 2.10 cm MV A velocity: 112.00 cm/s MV E/A ratio:  0.56 Sunit Tolia Electronically signed by Tessa Lerner Signature Date/Time: 04/14/2023/2:02:35 PM    Final     Vitals:   04/13/23 2349 04/14/23 0400 04/14/23 0758 04/14/23 1205  BP: (!) 179/90 138/70 (!) 161/97 (!) 152/86  Pulse: 93 70 (!) 103 (!) 116  Resp: 17  20 20   Temp: 97.7 F (36.5 C) 98.4 F (36.9 C) 97.6 F (36.4 C) 98.1 F (36.7 C)  TempSrc: Oral Oral Oral Oral  SpO2: 97% 97%    Weight:      Height:         PHYSICAL EXAM General: Mildly obese middle-aged African-American patient in no acute distress Psych:  Mood and affect appropriate for situation CV: Regular rate and rhythm on monitor Respiratory:  Regular, unlabored respirations on room air GI: Abdomen soft and  nontender   NEURO:  Mental Status: AA&Ox3, patient is able to give clear and coherent history Speech/Language: speech is without dysarthria or aphasia.  Naming, repetition, fluency, and comprehension intact.  Cranial Nerves:  II: PERRL. Visual fields full.  III, IV, VI: EOMI. Eyelids elevate symmetrically.  V: Sensation is intact to light touch and symmetrical to face.  VII: Mild right lower facial asymmetry.   VIII: hearing intact to voice. IX, X: Palate elevates symmetrically. Phonation is normal.  MW:[id number] shrug 5/5. XII: tongue is midline without fasciculations. Motor:  RUE 4/5 diminished fine finger movements on the right and orbits left or the right upper extremity. LUE 5/5 RLE 5/5 LLE 5/5 Right coordination slower Tone: is normal and bulk is normal Sensation- Intact to light touch bilaterally. Extinction absent to light touch to DSS.   Coordination: FTN intact bilaterally, HKS: no ataxia in BLE.No drift.  Gait- deferred  ASSESSMENT/PLAN  Acute Ischemic Infarct:  Scattered acute infarcts in the left MCA and PCA territories  Etiology:  significant widespread intracranial atherosclerosis  Code Stroke CT head - Patchy loss of gray-white differentiation in the high left frontal and parietal lobes, suspicious for acute left MCA territory infarcts. No acute hemorrhage. 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 Moderate to severe stenosis of the left ICA  communicating segment, just proximal to the origin of the left posterior communicating artery. Moderate to severe stenosis of the right ICA supraclinoid segment. At least 80% stenosis of the proximal right cervical ICA. Approximately 70% stenosis of the proximal left cervical ICA. 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 MRI  Multiple acute infarcts within the left hemisphere,  affecting the MCA and PCA territories. 2D Echo EF greater than 75%.  Left atrial size mildly dilated LDL 162 HgbA1c 9.1 VTE prophylaxis - lovenox No antithrombotic (prescribed plavix) prior to admission, now on clopidogrel 75 mg daily switch to brilinta for a minimum of 30 days and then back to plavix  Therapy recommendations:  Outpatient PT/OT/ST Disposition:  Pending   Congestive heart failure Hypertension CAD s/p CABG Home meds: Metoprolol, Lasix, Farxiga - reported not taking  Stable Blood Pressure Goal: BP less than 220/110   Hyperlipidemia Home meds: Prescribed Crestor, Zetia-reported not taking, resumed in hospital LDL 162, goal < 70 Resume Crestor and Zetia Continue statin at discharge  Diabetes type II Uncontrolled Home meds: Farxiga, insulin HgbA1c 9.1, goal < 7.0 CBGs SSI Recommend close follow-up with PCP for better DM control  Other Stroke Risk Factors ETOH use, alcohol level <10, advised to drink no more than 2 drink(s) a day Obesity, Body mass index is 33.63 kg/m., BMI >/= 30 associated with increased stroke risk, recommend weight loss, diet and exercise as appropriate    Hospital day # 2    Patient presented with right hand weakness and speech difficulties secondary to left MCA branch infarct due to high-grade terminal carotid stenosis from diffuse intracranial atherosclerosis.  Recommend dual antiplatelet therapy but since patient is aspirin allergic will recommend Brilinta for 90 days if he can afford it otherwise hospital pharmacy to provide coupon for 30 days and then switch to Plavix after that.  Maintain aggressive risk factor modification with strict control of hypertension and blood pressure goal below 140/90, lipids with LDL cholesterol goal below 70 mg percent and diabetes with hemoglobin A1c goal below 6.5%.  Patient counseled not to quit smoking and to eat a healthy diet and to be active and exercise regularly.  Continue ongoing therapies.  Stroke  team will sign off.  Kindly call for questions.  Follow-up as an outpatient stroke clinic in 2 months greater than 50% time during this 35-minute visit was spent in counseling and coordination of care and discussion patient and care team and answering questions.  Discussed with Dr. Thedore Mins. Delia Heady, MD Medical Director [facility] Pager: [phone] 04/14/2023 2:29 PM  To contact Stroke Continuity provider, please refer to [email or web address]. After hours, contact General Neurology

## 2023-04-14 NOTE — Progress Notes (Signed)
PROGRESS NOTE                                                                                                                                                                                                             Patient Demographics:    Dennis Hopkins, is a 62 y.o. male, DOB - Feb 26, 1961, DGU:[id number]  Outpatient Primary MD for the patient is Donato Schultz, DO    LOS - 2  Admit date - 04/12/2023    Chief Complaint  Patient presents with   Facial Droop       Brief Narrative (HPI from H&P)    62 y.o. male with medical history significant of CAD status post CABG, hypertension, insulin-dependent DM type II, hyperlipidemia, aspirin allergy, chronic alcohol use, and obesity presented to [facility] emergency department with complaining of right-sided weakness and last known normal 10 PM 04/11/2023.  Patient presented with right-sided facial droop and right-sided upper extremity weakness.  Workup suggestive of left MCA and PCA territory acute infarct.   Subjective:   Patient in bed, appears comfortable, denies any headache, no fever, no chest pain or pressure, no shortness of breath , no abdominal pain.  Right-sided weakness marginally improved.   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  Does have some surrounding petechial hemorrhages around the infarct territory, CTA noted, currently on Brilinta along with statin, Zetia and niacin for better LDL control, strictly counseled to abstain from alcohol.  Stroke service to see.  Echo pending, PT OT and speech.  Further per stroke team.  Of note patient likely will get 30 days of Brilinta from the hospital upon discharge thereafter he will resume his home Plavix, of note he was prescribed Plavix at home but he was noncompliant with it and was not taking it.  He is allergic to aspirin hence cannot do DAPT.   Essential  hypertension - CVA 36 hours ago, blood pressure at times systolic above 220, low-dose Lopressor and as needed hydralazine.  Case discussed with stroke team Dr. Pearlean Brownie.   History of CAD status post CABG  - Logic to aspirin, currently on Brilinta/Plavix along with low-dose beta-blocker,  Crestor and Zetia.   Hyperlipidemia -Continue niacin 2 Crestor and ezetimibe   Hypokalemia -Potassium 3.1. - Repleted with oral  KCl 40 mEq 1 dose.   Peripheral neuropathy due to chronic diabetes -Continue gabapentin 300 mg 3 times daily - Continue fall precaution   Morbid obesity -BMI 33.63. -Counseled patient bedside for weight reduction, exercise and diet management   History of chronic alcohol use currently patient is sober. - Drinks 3 to 4 glasses of hard liquor a day, counseled to quit, high chances of going into DTs, Librium plus CIWA protocol  Insulin-dependent DM type II -A1c 9.1 in August 2024 - Continue Semglee 40 unit in the a.m., low sliding scale insulin and bedtime insulin coverage as needed.  POC blood glucose check   CBG (last 3)  Recent Labs    04/13/23 1216 04/13/23 1609 04/13/23 2117  GLUCAP 205* 197* 216*   Lab Results  Component Value Date   HGBA1C 9.1 (H) 02/17/2023           Condition - Fair  Family Communication  :  None  Code Status :   Full  Consults  :  Neuro  PUD Prophylaxis :     Procedures  :     TTE -   MRI - 1. Multiple acute infarcts within the left hemisphere, affecting the MCA and PCA territories. 2. Petechial hemorrhage at these acute infarct sites. 3. Old right frontal infarct and findings of chronic small vessel ischemia.  CTA -  1. Patchy loss of gray-white differentiation in the high left frontal and parietal lobes, suspicious for acute left MCA territory infarcts. No acute hemorrhage. 2. No large vessel occlusion. 3. 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 Moderate to severe stenosis of the left ICA communicating segment,  just proximal to the origin of the left posterior communicating artery. 4. Moderate to severe stenosis of the right ICA supraclinoid segment. 5. At least 80% stenosis of the proximal right cervical ICA. Approximately 70% stenosis of the proximal left cervical ICA. 6. 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 Aortic Atherosclerosis       Disposition Plan  :    Status is: Inpatient  DVT Prophylaxis  :    SCD's Start: 04/12/23 1954    Lab Results  Component Value Date   PLT 163 04/13/2023    Diet :  Diet Order             Diet heart healthy/carb modified Room service appropriate? Yes; Fluid consistency: Thin  Diet effective now                    Inpatient Medications  Scheduled Meds:  chlordiazePOXIDE  10 mg Oral TID   ezetimibe  10 mg Oral Daily   folic acid  1 mg Oral Daily   gabapentin  300 mg Oral TID   insulin aspart  0-5 Units Subcutaneous QHS   insulin aspart  0-6 Units Subcutaneous TID WC   insulin glargine-yfgn  40 Units Subcutaneous Daily   LORazepam  0-4 mg Intravenous Q6H   Followed by   Melene Muller ON 04/15/2023] LORazepam  0-4 mg Intravenous Q12H   metoprolol tartrate  25 mg Oral BID   multivitamin with minerals  1 tablet Oral Daily   niacin  100 mg Oral BID WC   rosuvastatin  40 mg Oral Daily   thiamine  100 mg Oral Daily   Or   thiamine  100 mg Intravenous Daily   ticagrelor  90 mg Oral BID   Continuous Infusions: PRN Meds:.acetaminophen **OR** acetaminophen (TYLENOL) oral liquid 160 mg/5 mL **OR** acetaminophen, hydrALAZINE, LORazepam **OR** LORazepam, perflutren lipid microspheres (DEFINITY) IV suspension, senna-docusate, traMADol  Antibiotics  :    Anti-infectives (From admission, onward)    None         Objective:   Vitals:   04/13/23 2158 04/13/23 2349 04/14/23 0400 04/14/23 0758  BP: (!) 168/90 (!) 179/90  138/70 (!) 161/97  Pulse: 88 93 70 (!) 103  Resp:  17  20  Temp:  97.7 F (36.5 C) 98.4 F (36.9 C) 97.6 F (36.4 C)  TempSrc:  Oral Oral Oral  SpO2:  97% 97%   Weight:      Height:        Wt Readings from Last 3 Encounters:  04/12/23 106.3 kg  02/17/23 106.2 kg  09/08/22 111.7 kg     Intake/Output Summary (Last 24 hours) at 04/14/2023 0943 Last data filed at 04/14/2023 0750 Gross per 24 hour  Intake --  Output 800 ml  Net -800 ml     Physical Exam  Awake Alert, No new F.N deficits, mild right-sided hemiparesis arm more than leg [place].AT,PERRAL Supple Neck, No JVD,   Symmetrical Chest wall movement, Good air movement bilaterally, CTAB RRR,No Gallops,Rubs or new Murmurs,  +ve B.Sounds, Abd Soft, No tenderness,   No Cyanosis, Clubbing or edema     Data Review:    Recent Labs  Lab 04/12/23 1054 04/13/23 0620  WBC 6.7 7.6  HGB 15.4 15.0  HCT 45.3 44.3  PLT 161 163  MCV 86.9 85.2  MCH 29.6 28.8  MCHC 34.0 33.9  RDW 13.0 12.8  LYMPHSABS 2.4  --   MONOABS 0.6  --   EOSABS 0.3  --   BASOSABS 0.1  --     Recent Labs  Lab 04/12/23 1054 04/13/23 0620 04/14/23 0335  NA 137 137 136  K 3.1* 3.5 3.7  CL 98 101 103  CO2 23 20* 23  ANIONGAP 16* 16* 10  GLUCOSE 241* 253* 199*  BUN 13 9 11   CREATININE 1.07 0.92 0.81  AST 44* 38  --   ALT 56* 50*  --   ALKPHOS 72 63  --   BILITOT 1.7* 1.4*  --   ALBUMIN 4.1 3.8  --   MG  --   --  1.9  CALCIUM 9.1 9.2 9.3      Recent Labs  Lab 04/12/23 1054 04/13/23 0620 04/14/23 0335  MG  --   --  1.9  CALCIUM 9.1 9.2 9.3    --------------------------------------------------------------------------------------------------------------- Lab Results  Component Value Date   CHOL 221 (H) 04/13/2023   HDL 38 (L) 04/13/2023   LDLCALC 162 (H) 04/13/2023   LDLDIRECT 173.0 02/07/2022   TRIG 107 04/13/2023   CHOLHDL 5.8 04/13/2023    Lab Results  Component Value Date   HGBA1C 9.1 (H) 02/17/2023   No results for  input(s): "TSH", "T4TOTAL", "FREET4", "T3FREE", "THYROIDAB" in the last 72 hours. No results for input(s): "VITAMINB12", "FOLATE", "FERRITIN", "TIBC", "IRON", "RETICCTPCT" in the last 72 hours. ------------------------------------------------------------------------------------------------------------------ Cardiac Enzymes No results for input(s): "CKMB", "TROPONINI", "MYOGLOBIN" in the last 168 hours.  Invalid input(s): "CK"  Micro Results No results found for this or any previous visit (from the past 240 hour(s)).  Radiology Reports MR BRAIN WO CONTRAST  Result Date: 04/13/2023 CLINICAL DATA:  Stroke follow-up.  Abnormal head CT. EXAM: MRI HEAD WITHOUT CONTRAST TECHNIQUE: Multiplanar, multiecho pulse sequences  of the brain and surrounding structures were obtained without intravenous contrast. COMPARISON:  CTA head neck 04/12/2023 FINDINGS: Brain: Multifocal abnormal diffusion restriction within the left hemisphere, affecting the MCA and PCA territories. There is petechial hemorrhage throughout most of these sites of acute ischemia, with surrounding cytotoxic edema. There is multifocal hyperintense T2-weighted signal within the white matter. Parenchymal volume and CSF spaces are normal. Old right frontal infarct. The midline structures are normal. Vascular: Normal flow voids. Skull and upper cervical spine: Normal calvarium and skull base. Visualized upper cervical spine and soft tissues are normal. Sinuses/Orbits:Mild sphenoid and right maxillary sinus mucosal thickening. Normal orbits. IMPRESSION: 1. Multiple acute infarcts within the left hemisphere, affecting the MCA and PCA territories. 2. Petechial hemorrhage at these acute infarct sites. 3. Old right frontal infarct and findings of chronic small vessel ischemia. Electronically Signed   By: Deatra Robinson M.D.   On: 04/13/2023 01:21   CT ANGIO HEAD NECK W WO CM  Result Date: 04/12/2023 CLINICAL DATA:  RUE weakness/numb, facial droop. EXAM:  CT ANGIOGRAPHY HEAD AND NECK WITH AND WITHOUT CONTRAST TECHNIQUE: Multidetector CT imaging of the head and neck was performed using the standard protocol during bolus administration of intravenous contrast. Multiplanar CT image reconstructions and MIPs were obtained to evaluate the vascular anatomy. Carotid stenosis measurements (when applicable) are obtained utilizing NASCET criteria, using the distal internal carotid diameter as the denominator. RADIATION DOSE REDUCTION: This exam was performed according to the departmental dose-optimization program which includes automated exposure control, adjustment of the mA and/or kV according to patient size and/or use of iterative reconstruction technique. CONTRAST:  75mL OMNIPAQUE IOHEXOL 350 MG/ML SOLN COMPARISON:  None Available. FINDINGS: CT HEAD FINDINGS Brain: No acute hemorrhage. Patchy loss of gray-white differentiation in the high left frontal and parietal lobes, suspicious for acute infarcts. Old infarct in the right middle frontal gyrus. No hydrocephalus or extra-axial collection. No mass effect or midline shift. Vascular: No hyperdense vessel or unexpected calcification. Skull: No calvarial fracture or suspicious bone lesion. Skull base is unremarkable. Sinuses/Orbits: Near-complete opacification of the left sphenoid sinus. Other: None. Review of the MIP images confirms the above findings CTA NECK FINDINGS Aortic arch: Three-vessel arch configuration. Ulcerated atherosclerotic plaque along the aortic arch. Postoperative changes from prior median sternotomy and CABG. Arch vessel origins are patent. Right carotid system: At least 80% stenosis of the proximal right cervical ICA. Precise measurement is limited by blooming artifact from adjacent calcified plaque. Left carotid system: Approximately 70% stenosis of the proximal left cervical ICA. Vertebral arteries: Right dominant. The non dominant left vertebral artery is occluded at its origin with reconstitution of  the distal left V2 segment, likely via retrograde flow from the basilar artery. Skeleton: Multilevel cervical spondylosis, worst at C4-5, where there is at least mild spinal canal stenosis. Other neck: Unremarkable. Upper chest: Unremarkable. 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 Moderate to severe stenosis of the left ICA communicating segment, just proximal to the origin of the left posterior communicating artery. Moderate to severe stenosis of the right ICA supraclinoid segment. The proximal ACAs and MCAs are patent without stenosis or aneurysm. Distal branches are symmetric. Posterior circulation: Normal basilar artery. The SCAs, AICAs and PICAs are patent proximally. The PCAs are patent proximally without stenosis or aneurysm. Distal branches are symmetric. Venous sinuses: As permitted by contrast timing, patent. Anatomic variants: None. Review of the MIP images confirms the above  findings IMPRESSION: 1. Patchy loss of gray-white differentiation in the high left frontal and parietal lobes, suspicious for acute left MCA territory infarcts. No acute hemorrhage. 2. No large vessel occlusion. 3. 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 Moderate to severe stenosis of the left ICA communicating segment, just proximal to the origin of the left posterior communicating artery. 4. Moderate to severe stenosis of the right ICA supraclinoid segment. 5. At least 80% stenosis of the proximal right cervical ICA. Approximately 70% stenosis of the proximal left cervical ICA. 6. 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 Aortic Atherosclerosis (ICD10-I70.0). Electronically Signed   By: Orvan Falconer M.D.   On: 04/12/2023 12:39      Signature  -   Susa Raring M.D on 04/14/2023 at 9:43  AM   -  To page go to [email or web address]

## 2023-04-14 NOTE — Plan of Care (Signed)
Problem: Education: Goal: Knowledge of General Education information will improve Description: Including pain rating scale, medication(s)/side effects and non-pharmacologic comfort measures Outcome: Progressing   Problem: Health Behavior/Discharge Planning: Goal: Ability to manage health-related needs will improve Outcome: Progressing   Problem: Clinical Measurements: Goal: Ability to maintain clinical measurements within normal limits will improve Outcome: Progressing Goal: Will remain free from infection Outcome: Progressing Goal: Diagnostic test results will improve Outcome: Progressing Goal: Respiratory complications will improve Outcome: Progressing Goal: Cardiovascular complication will be avoided Outcome: Progressing   Problem: Activity: Goal: Risk for activity intolerance will decrease Outcome: Progressing   Problem: Nutrition: Goal: Adequate nutrition will be maintained Outcome: Progressing   Problem: Coping: Goal: Level of anxiety will decrease Outcome: Progressing   Problem: Elimination: Goal: Will not experience complications related to bowel motility Outcome: Progressing Goal: Will not experience complications related to urinary retention Outcome: Progressing   Problem: Pain Management: Goal: General experience of comfort will improve Outcome: Progressing   Problem: Safety: Goal: Ability to remain free from injury will improve Outcome: Progressing   Problem: Skin Integrity: Goal: Risk for impaired skin integrity will decrease Outcome: Progressing   Problem: Education: Goal: Ability to describe self-care measures that may prevent or decrease complications (Diabetes Survival Skills Education) will improve Outcome: Progressing Goal: Individualized Educational Video(s) Outcome: Progressing   Problem: Coping: Goal: Ability to adjust to condition or change in health will improve Outcome: Progressing   Problem: Fluid Volume: Goal: Ability to  maintain a balanced intake and output will improve Outcome: Progressing   Problem: Health Behavior/Discharge Planning: Goal: Ability to identify and utilize available resources and services will improve Outcome: Progressing Goal: Ability to manage health-related needs will improve Outcome: Progressing   Problem: Metabolic: Goal: Ability to maintain appropriate glucose levels will improve Outcome: Progressing   Problem: Nutritional: Goal: Maintenance of adequate nutrition will improve Outcome: Progressing Goal: Progress toward achieving an optimal weight will improve Outcome: Progressing   Problem: Skin Integrity: Goal: Risk for impaired skin integrity will decrease Outcome: Progressing   Problem: Tissue Perfusion: Goal: Adequacy of tissue perfusion will improve Outcome: Progressing   Problem: Education: Goal: Knowledge of disease or condition will improve Outcome: Progressing Goal: Knowledge of secondary prevention will improve (MUST DOCUMENT ALL) Outcome: Progressing Goal: Knowledge of patient specific risk factors will improve Loraine Leriche N/A or DELETE if not current risk factor) Outcome: Progressing   Problem: Ischemic Stroke/TIA Tissue Perfusion: Goal: Complications of ischemic stroke/TIA will be minimized Outcome: Progressing   Problem: Coping: Goal: Will verbalize positive feelings about self Outcome: Progressing Goal: Will identify appropriate support needs Outcome: Progressing   Problem: Health Behavior/Discharge Planning: Goal: Ability to manage health-related needs will improve Outcome: Progressing Goal: Goals will be collaboratively established with patient/family Outcome: Progressing   Problem: Self-Care: Goal: Ability to participate in self-care as condition permits will improve Outcome: Progressing Goal: Verbalization of feelings and concerns over difficulty with self-care will improve Outcome: Progressing Goal: Ability to communicate needs accurately  will improve Outcome: Progressing   Problem: Nutrition: Goal: Risk of aspiration will decrease Outcome: Progressing Goal: Dietary intake will improve Outcome: Progressing   Problem: Consults Goal: RH STROKE PATIENT EDUCATION Description: See Patient Education module for education specifics  Outcome: Progressing Goal: Nutrition Consult-if indicated Outcome: Progressing Goal: Diabetes Guidelines if Diabetic/Glucose > 140 Description: If diabetic or lab glucose is > 140 mg/dl - Initiate Diabetes/Hyperglycemia Guidelines & Document Interventions  Outcome: Progressing   Problem: RH BOWEL ELIMINATION Goal: RH STG MANAGE BOWEL WITH ASSISTANCE Description: STG Manage  Bowel with Assistance. Outcome: Progressing Goal: RH STG MANAGE BOWEL W/MEDICATION W/ASSISTANCE Description: STG Manage Bowel with Medication with Assistance. Outcome: Progressing   Problem: RH BLADDER ELIMINATION Goal: RH STG MANAGE BLADDER WITH ASSISTANCE Description: STG Manage Bladder With Assistance Outcome: Progressing Goal: RH STG MANAGE BLADDER WITH MEDICATION WITH ASSISTANCE Description: STG Manage Bladder With Medication With Assistance. Outcome: Progressing Goal: RH STG MANAGE BLADDER WITH EQUIPMENT WITH ASSISTANCE Description: STG Manage Bladder With Equipment With Assistance Outcome: Progressing   Problem: RH SKIN INTEGRITY Goal: RH STG SKIN FREE OF INFECTION/BREAKDOWN Outcome: Progressing Goal: RH STG MAINTAIN SKIN INTEGRITY WITH ASSISTANCE Description: STG Maintain Skin Integrity With Assistance. Outcome: Progressing Goal: RH STG ABLE TO PERFORM INCISION/WOUND CARE W/ASSISTANCE Description: STG Able To Perform Incision/Wound Care With Assistance. Outcome: Progressing

## 2023-04-14 NOTE — TOC Initial Note (Signed)
Transition of Care [facility]) - Initial/Assessment Note    Patient Details  Name: Dennis Hopkins MRN: [id number] Date of Birth: 04-30-1961  [facility] CM/SW Contact:    Mearl Latin, LCSW Phone Number: 04/14/2023, 11:47 AM  Clinical Narrative:                 TOC received request from MD to have patient screened for CIR. CSW sent referral to [facility] coordinator who determined patient is not a candidate for CIR. RNCM to follow up for outpatient therapy referral.   Expected Discharge Plan: OP Rehab Barriers to Discharge: Continued Medical Work up   Patient Goals and CMS Choice Patient states their goals for this hospitalization and ongoing recovery are:: Rehab          Expected Discharge Plan and Services   Discharge Planning Services: CM Consult   Living arrangements for the past 2 months: Apartment                                      Prior Living Arrangements/Services Living arrangements for the past 2 months: Apartment   Patient language and need for interpreter reviewed:: Yes        Need for Family Participation in Patient Care: Yes (Comment) Care giver support system in place?: Yes (comment)   Criminal Activity/Legal Involvement Pertinent to Current Situation/Hospitalization: No - Comment as needed  Activities of Daily Living      Permission Sought/Granted                  Emotional Assessment Appearance:: Appears stated age     Orientation: : Oriented to Self, Oriented to Place, Oriented to  Time, Oriented to Situation Alcohol / Substance Use: Alcohol Use Psych Involvement: No (comment)  Admission diagnosis:  Acute CVA (cerebrovascular accident) (HCC) [I63.9] Cerebrovascular accident (CVA) due to occlusion of left middle cerebral artery (HCC) [I63.512] Patient Active Problem List   Diagnosis Date Noted   Acute CVA (cerebrovascular accident) (HCC) 04/12/2023   History of CAD (coronary artery disease) 04/12/2023   Chronic  alcohol use 04/12/2023   Hypokalemia 04/12/2023   Morbid obesity (HCC) 04/12/2023   Peripheral neuropathy 04/12/2023   Eczema 04/12/2022   Lumbar foraminal stenosis 01/05/2022   Type 2 diabetes mellitus with diabetic peripheral angiopathy without gangrene, without long-term current use of insulin (HCC) 09/06/2021   Calcified granuloma of lung 09/06/2021   Peripheral neuropathic pain 09/06/2021   Type 2 diabetes mellitus with hyperglycemia, without long-term current use of insulin (HCC) 04/13/2021   Diabetes mellitus (HCC) 04/13/2021   Essential hypertension    Hyperlipidemia    Chickenpox    Urinary frequency 08/24/2020   Snoring 08/24/2020   Rash 08/24/2020   Kidney stones 02/14/2019   Acute left-sided low back pain with right-sided sciatica 02/14/2019   Insulin dependent type 2 diabetes mellitus (HCC) 07/19/2016   Diabetes (HCC) 01/18/2016   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   Coronary artery disease 01/12/2016   Exertional angina (HCC)    Chest pain 11/11/2015   HTN (hypertension) 10/20/2015   Hyperlipidemia LDL goal <70 10/20/2015   Preventative health care 09/29/2014   Obesity (BMI 30-39.9) 09/26/2013   Tobacco use disorder 09/26/2013   PCP:  Donato Schultz, DO Pharmacy:   [facility] [id number] - [place], [place] - [address] [place]  [address] Phone: [phone] Fax: [phone]  [facility] [address] [place], [place] - [address] [place] [address] Phone: [phone] Fax: [phone]  [facility] [address] [place] [address] Phone: [phone] Fax: [phone]     Social Determinants of Health (SDOH) Social History: SDOH Screenings   Depression (PHQ2-9): Low Risk  (02/17/2023)  Financial Resource Strain: Low Risk  (11/22/2021)  Tobacco Use: Medium Risk (04/12/2023)   SDOH Interventions:     Readmission Risk Interventions      No data to display

## 2023-04-15 ENCOUNTER — Other Ambulatory Visit (HOSPITAL_COMMUNITY): Payer: Self-pay

## 2023-04-15 DIAGNOSIS — I639 Cerebral infarction, unspecified: Secondary | ICD-10-CM | POA: Diagnosis not present

## 2023-04-15 LAB — BASIC METABOLIC PANEL
Anion gap: 12 (ref 5–15)
BUN: 14 mg/dL (ref 8–23)
CO2: 23 mmol/L (ref 22–32)
Calcium: 9.4 mg/dL (ref 8.9–10.3)
Chloride: 102 mmol/L (ref 98–111)
Creatinine, Ser: 0.82 mg/dL (ref 0.61–1.24)
GFR, Estimated: >60 mL/min (ref >60)
Glucose, Bld: 159 mg/dL — ABNORMAL HIGH (ref 70–99)
Potassium: 4.1 mmol/L (ref 3.5–5.1)
Sodium: 137 mmol/L (ref 135–145)

## 2023-04-15 LAB — GLUCOSE, CAPILLARY
Glucose-Capillary: 179 mg/dL — ABNORMAL HIGH (ref 70–99)
Glucose-Capillary: 131 mg/dL — ABNORMAL HIGH (ref 70–99)
Glucose-Capillary: 179 mg/dL — ABNORMAL HIGH (ref 70–99)

## 2023-04-15 LAB — MAGNESIUM: Magnesium: 1.9 mg/dL (ref 1.7–2.4)

## 2023-04-15 LAB — PHOSPHORUS: Phosphorus: 3.3 mg/dL (ref 2.5–4.6)

## 2023-04-15 MED ORDER — ROSUVASTATIN CALCIUM 20 MG PO TABS
40.0000 mg | ORAL_TABLET | Freq: Every day | ORAL | 1 refills | Status: AC
  Filled 2023-04-15: qty 60, 30d supply, fill #0

## 2023-04-15 MED ORDER — TICAGRELOR 90 MG PO TABS
90.0000 mg | ORAL_TABLET | Freq: Two times a day (BID) | ORAL | 0 refills | Status: AC
  Filled 2023-04-15: qty 60, 30d supply, fill #0

## 2023-04-15 MED ORDER — EZETIMIBE 10 MG PO TABS
10.0000 mg | ORAL_TABLET | Freq: Every day | ORAL | 1 refills | Status: DC
  Filled 2023-04-15: qty 30, 30d supply, fill #0

## 2023-04-15 MED ORDER — FOLIC ACID 1 MG PO TABS
1.0000 mg | ORAL_TABLET | Freq: Every day | ORAL | 0 refills | Status: AC
  Filled 2023-04-15: qty 30, 30d supply, fill #0

## 2023-04-15 MED ORDER — THIAMINE HCL 100 MG PO TABS
100.0000 mg | ORAL_TABLET | Freq: Every day | ORAL | 0 refills | Status: AC
  Filled 2023-04-15: qty 30, 30d supply, fill #0

## 2023-04-15 MED ORDER — METOPROLOL SUCCINATE ER 200 MG PO TB24: 200.0000 mg | ORAL_TABLET | Freq: Every day | ORAL | Status: DC

## 2023-04-15 NOTE — Discharge Instructions (Addendum)
Follow with Primary MD Zola Button, [place] Congress, DO in 7 days   Get CBC, CMP, 2 view Chest X ray -  checked next visit with your primary MD    Activity: As tolerated with Full fall precautions use walker/cane & assistance as needed  Disposition Home    Diet: Heart Healthy Low Carb, check CBGs QAC-HS  Special Instructions: If you have smoked or chewed Tobacco  in the last 2 yrs please stop smoking, stop any regular Alcohol  and or any Recreational drug use.  On your next visit with your primary care physician please Get Medicines reviewed and adjusted.  Please request your Prim.MD to go over all Hospital Tests and Procedure/Radiological results at the follow up, please get all Hospital records sent to your Prim MD by signing hospital release before you go home.  If you experience worsening of your admission symptoms, develop shortness of breath, life threatening emergency, suicidal or homicidal thoughts you must seek medical attention immediately by calling 911 or calling your MD immediately  if symptoms less severe.  You Must read complete instructions/literature along with all the possible adverse reactions/side effects for all the Medicines you take and that have been prescribed to you. Take any new Medicines after you have completely understood and accpet all the possible adverse reactions/side effects.

## 2023-04-15 NOTE — TOC Progression Note (Signed)
Discharge medications (5) are being stored in the Transitions of Care (TOC) Pharmacy on the second floor until patient is ready for discharge.    

## 2023-04-15 NOTE — Plan of Care (Signed)
Problem: Education: Goal: Knowledge of General Education information will improve Description: Including pain rating scale, medication(s)/side effects and non-pharmacologic comfort measures Outcome: Completed/Met   Problem: Health Behavior/Discharge Planning: Goal: Ability to manage health-related needs will improve Outcome: Completed/Met   Problem: Clinical Measurements: Goal: Ability to maintain clinical measurements within normal limits will improve Outcome: Completed/Met Goal: Will remain free from infection Outcome: Completed/Met Goal: Diagnostic test results will improve Outcome: Completed/Met Goal: Respiratory complications will improve Outcome: Completed/Met Goal: Cardiovascular complication will be avoided Outcome: Completed/Met   Problem: Activity: Goal: Risk for activity intolerance will decrease Outcome: Completed/Met   Problem: Nutrition: Goal: Adequate nutrition will be maintained Outcome: Completed/Met   Problem: Coping: Goal: Level of anxiety will decrease Outcome: Completed/Met   Problem: Elimination: Goal: Will not experience complications related to bowel motility Outcome: Completed/Met Goal: Will not experience complications related to urinary retention Outcome: Completed/Met   Problem: Pain Management: Goal: General experience of comfort will improve Outcome: Completed/Met   Problem: Safety: Goal: Ability to remain free from injury will improve Outcome: Completed/Met   Problem: Skin Integrity: Goal: Risk for impaired skin integrity will decrease Outcome: Completed/Met   Problem: Education: Goal: Ability to describe self-care measures that may prevent or decrease complications (Diabetes Survival Skills Education) will improve Outcome: Completed/Met Goal: Individualized Educational Video(s) Outcome: Completed/Met   Problem: Coping: Goal: Ability to adjust to condition or change in health will improve Outcome: Completed/Met   Problem:  Fluid Volume: Goal: Ability to maintain a balanced intake and output will improve Outcome: Completed/Met   Problem: Health Behavior/Discharge Planning: Goal: Ability to identify and utilize available resources and services will improve Outcome: Completed/Met Goal: Ability to manage health-related needs will improve Outcome: Completed/Met   Problem: Metabolic: Goal: Ability to maintain appropriate glucose levels will improve Outcome: Completed/Met   Problem: Nutritional: Goal: Maintenance of adequate nutrition will improve Outcome: Completed/Met Goal: Progress toward achieving an optimal weight will improve Outcome: Completed/Met   Problem: Skin Integrity: Goal: Risk for impaired skin integrity will decrease Outcome: Completed/Met   Problem: Tissue Perfusion: Goal: Adequacy of tissue perfusion will improve Outcome: Completed/Met   Problem: Education: Goal: Knowledge of disease or condition will improve Outcome: Completed/Met Goal: Knowledge of secondary prevention will improve (MUST DOCUMENT ALL) Outcome: Completed/Met Goal: Knowledge of patient specific risk factors will improve Loraine Leriche N/A or DELETE if not current risk factor) Outcome: Completed/Met   Problem: Ischemic Stroke/TIA Tissue Perfusion: Goal: Complications of ischemic stroke/TIA will be minimized Outcome: Completed/Met   Problem: Coping: Goal: Will verbalize positive feelings about self Outcome: Completed/Met Goal: Will identify appropriate support needs Outcome: Completed/Met   Problem: Health Behavior/Discharge Planning: Goal: Ability to manage health-related needs will improve Outcome: Completed/Met Goal: Goals will be collaboratively established with patient/family Outcome: Completed/Met   Problem: Self-Care: Goal: Ability to participate in self-care as condition permits will improve Outcome: Completed/Met Goal: Verbalization of feelings and concerns over difficulty with self-care will  improve Outcome: Completed/Met Goal: Ability to communicate needs accurately will improve Outcome: Completed/Met   Problem: Nutrition: Goal: Risk of aspiration will decrease Outcome: Completed/Met Goal: Dietary intake will improve Outcome: Completed/Met   Problem: Consults Goal: RH STROKE PATIENT EDUCATION Description: See Patient Education module for education specifics  Outcome: Completed/Met Goal: Nutrition Consult-if indicated Outcome: Completed/Met Goal: Diabetes Guidelines if Diabetic/Glucose > 140 Description: If diabetic or lab glucose is > 140 mg/dl - Initiate Diabetes/Hyperglycemia Guidelines & Document Interventions  Outcome: Completed/Met   Problem: RH BOWEL ELIMINATION Goal: RH STG MANAGE BOWEL WITH ASSISTANCE Description: STG Manage  Bowel with Assistance. Outcome: Completed/Met Goal: RH STG MANAGE BOWEL W/MEDICATION W/ASSISTANCE Description: STG Manage Bowel with Medication with Assistance. Outcome: Completed/Met   Problem: RH BLADDER ELIMINATION Goal: RH STG MANAGE BLADDER WITH ASSISTANCE Description: STG Manage Bladder With Assistance Outcome: Completed/Met Goal: RH STG MANAGE BLADDER WITH MEDICATION WITH ASSISTANCE Description: STG Manage Bladder With Medication With Assistance. Outcome: Completed/Met Goal: RH STG MANAGE BLADDER WITH EQUIPMENT WITH ASSISTANCE Description: STG Manage Bladder With Equipment With Assistance Outcome: Completed/Met   Problem: RH SKIN INTEGRITY Goal: RH STG SKIN FREE OF INFECTION/BREAKDOWN Outcome: Completed/Met Goal: RH STG MAINTAIN SKIN INTEGRITY WITH ASSISTANCE Description: STG Maintain Skin Integrity With Assistance. Outcome: Completed/Met Goal: RH STG ABLE TO PERFORM INCISION/WOUND CARE W/ASSISTANCE Description: STG Able To Perform Incision/Wound Care With Assistance. Outcome: Completed/Met   Problem: RH SAFETY Goal: RH STG ADHERE TO SAFETY PRECAUTIONS W/ASSISTANCE/DEVICE Description: STG Adhere to Safety  Precautions With Assistance/Device. Outcome: Completed/Met Goal: RH STG DECREASED RISK OF FALL WITH ASSISTANCE Description: STG Decreased Risk of Fall With Assistance. Outcome: Completed/Met   Problem: RH COGNITION-NURSING Goal: RH STG USES MEMORY AIDS/STRATEGIES W/ASSIST TO PROBLEM SOLVE Description: STG Uses Memory Aids/Strategies With Assistance to Problem Solve. Outcome: Completed/Met Goal: RH STG ANTICIPATES NEEDS/CALLS FOR ASSIST W/ASSIST/CUES Description: STG Anticipates Needs/Calls for Assist With Assistance/Cues. Outcome: Completed/Met   Problem: RH PAIN MANAGEMENT Goal: RH STG PAIN MANAGED AT OR BELOW PT'S PAIN GOAL Outcome: Completed/Met   Problem: RH KNOWLEDGE DEFICIT Goal: RH STG INCREASE KNOWLEDGE OF DIABETES Outcome: Completed/Met Goal: RH STG INCREASE KNOWLEDGE OF HYPERTENSION Outcome: Completed/Met Goal: RH STG INCREASE KNOWLEDGE OF DYSPHAGIA/FLUID INTAKE Outcome: Completed/Met Goal: RH STG INCREASE KNOWLEGDE OF HYPERLIPIDEMIA Outcome: Completed/Met Goal: RH STG INCREASE KNOWLEDGE OF STROKE PROPHYLAXIS Outcome: Completed/Met   Problem: RH Vision Goal: RH LTG Vision (Specify) Outcome: Completed/Met   Problem: RH Pre-functional/Other (Specify) Goal: RH LTG Pre-functional (Specify) Outcome: Completed/Met Goal: RH LTG Interdisciplinary (Specify) 1 Description: RH LTG Interdisciplinary (Specify)1 Outcome: Completed/Met Goal: RH LTG Interdisciplinary (Specify) 2 Description: RH LTG Interdisciplinary (Specify) 2  Outcome: Completed/Met

## 2023-04-15 NOTE — Plan of Care (Signed)
Problem: Education: Goal: Knowledge of General Education information will improve Description: Including pain rating scale, medication(s)/side effects and non-pharmacologic comfort measures Outcome: Progressing   Problem: Health Behavior/Discharge Planning: Goal: Ability to manage health-related needs will improve Outcome: Progressing   Problem: Clinical Measurements: Goal: Ability to maintain clinical measurements within normal limits will improve Outcome: Progressing Goal: Will remain free from infection Outcome: Progressing Goal: Diagnostic test results will improve Outcome: Progressing Goal: Respiratory complications will improve Outcome: Progressing Goal: Cardiovascular complication will be avoided Outcome: Progressing   Problem: Activity: Goal: Risk for activity intolerance will decrease Outcome: Progressing   Problem: Nutrition: Goal: Adequate nutrition will be maintained Outcome: Progressing   Problem: Coping: Goal: Level of anxiety will decrease Outcome: Progressing   Problem: Elimination: Goal: Will not experience complications related to bowel motility Outcome: Progressing Goal: Will not experience complications related to urinary retention Outcome: Progressing   Problem: Pain Management: Goal: General experience of comfort will improve Outcome: Progressing   Problem: Safety: Goal: Ability to remain free from injury will improve Outcome: Progressing   Problem: Skin Integrity: Goal: Risk for impaired skin integrity will decrease Outcome: Progressing   Problem: Education: Goal: Ability to describe self-care measures that may prevent or decrease complications (Diabetes Survival Skills Education) will improve Outcome: Progressing Goal: Individualized Educational Video(s) Outcome: Progressing   Problem: Coping: Goal: Ability to adjust to condition or change in health will improve Outcome: Progressing   Problem: Fluid Volume: Goal: Ability to  maintain a balanced intake and output will improve Outcome: Progressing   Problem: Health Behavior/Discharge Planning: Goal: Ability to identify and utilize available resources and services will improve Outcome: Progressing Goal: Ability to manage health-related needs will improve Outcome: Progressing   Problem: Metabolic: Goal: Ability to maintain appropriate glucose levels will improve Outcome: Progressing   Problem: Nutritional: Goal: Maintenance of adequate nutrition will improve Outcome: Progressing Goal: Progress toward achieving an optimal weight will improve Outcome: Progressing   Problem: Skin Integrity: Goal: Risk for impaired skin integrity will decrease Outcome: Progressing   Problem: Tissue Perfusion: Goal: Adequacy of tissue perfusion will improve Outcome: Progressing   Problem: Education: Goal: Knowledge of disease or condition will improve Outcome: Progressing Goal: Knowledge of secondary prevention will improve (MUST DOCUMENT ALL) Outcome: Progressing Goal: Knowledge of patient specific risk factors will improve Loraine Leriche N/A or DELETE if not current risk factor) Outcome: Progressing   Problem: Ischemic Stroke/TIA Tissue Perfusion: Goal: Complications of ischemic stroke/TIA will be minimized Outcome: Progressing   Problem: Coping: Goal: Will verbalize positive feelings about self Outcome: Progressing Goal: Will identify appropriate support needs Outcome: Progressing   Problem: Health Behavior/Discharge Planning: Goal: Ability to manage health-related needs will improve Outcome: Progressing Goal: Goals will be collaboratively established with patient/family Outcome: Progressing   Problem: Self-Care: Goal: Ability to participate in self-care as condition permits will improve Outcome: Progressing Goal: Verbalization of feelings and concerns over difficulty with self-care will improve Outcome: Progressing Goal: Ability to communicate needs accurately  will improve Outcome: Progressing   Problem: Nutrition: Goal: Risk of aspiration will decrease Outcome: Progressing Goal: Dietary intake will improve Outcome: Progressing   Problem: Consults Goal: RH STROKE PATIENT EDUCATION Description: See Patient Education module for education specifics  Outcome: Progressing Goal: Nutrition Consult-if indicated Outcome: Progressing Goal: Diabetes Guidelines if Diabetic/Glucose > 140 Description: If diabetic or lab glucose is > 140 mg/dl - Initiate Diabetes/Hyperglycemia Guidelines & Document Interventions  Outcome: Progressing   Problem: RH BOWEL ELIMINATION Goal: RH STG MANAGE BOWEL WITH ASSISTANCE Description: STG Manage  Bowel with Assistance. Outcome: Progressing Goal: RH STG MANAGE BOWEL W/MEDICATION W/ASSISTANCE Description: STG Manage Bowel with Medication with Assistance. Outcome: Progressing   Problem: RH BLADDER ELIMINATION Goal: RH STG MANAGE BLADDER WITH ASSISTANCE Description: STG Manage Bladder With Assistance Outcome: Progressing Goal: RH STG MANAGE BLADDER WITH MEDICATION WITH ASSISTANCE Description: STG Manage Bladder With Medication With Assistance. Outcome: Progressing Goal: RH STG MANAGE BLADDER WITH EQUIPMENT WITH ASSISTANCE Description: STG Manage Bladder With Equipment With Assistance Outcome: Progressing   Problem: RH SKIN INTEGRITY Goal: RH STG SKIN FREE OF INFECTION/BREAKDOWN Outcome: Progressing Goal: RH STG MAINTAIN SKIN INTEGRITY WITH ASSISTANCE Description: STG Maintain Skin Integrity With Assistance. Outcome: Progressing Goal: RH STG ABLE TO PERFORM INCISION/WOUND CARE W/ASSISTANCE Description: STG Able To Perform Incision/Wound Care With Assistance. Outcome: Progressing

## 2023-04-15 NOTE — Discharge Summary (Signed)
Dennis Hopkins [id number] DOB: 1961/03/24 DOA: 04/12/2023  PCP: Donato Schultz, DO  Admit date: 04/12/2023  Discharge date: 04/15/2023  Admitted From: [facility]   Disposition:  Home    Recommendations for Outpatient Follow-up:   Follow up with PCP in 1-2 weeks  PCP Please obtain BMP/CBC, 2 view CXR in 1week,  (see Discharge instructions)   PCP Please follow up on the following pending results: 1 month of Brilinta then switch to Plavix, make sure he is taking his Crestor and Zetia.  Strict outpatient risk modification of his stroke risk factors.   Home Health: PT, OT and speech therapy if he qualifies Equipment/Devices: As below Consultations: Neuro Discharge Condition: Stable    CODE STATUS: Full    Diet Recommendation: Heart Healthy Low Carb    Chief Complaint  Patient presents with   Facial Droop     Brief history of present illness from the day of admission and additional interim summary    62 y.o. male with medical history significant of CAD status post CABG, hypertension, insulin-dependent DM type II, hyperlipidemia, aspirin allergy, chronic alcohol use, and obesity presented to [facility] emergency department with complaining of right-sided weakness and last known normal 10 PM 04/11/2023.  Patient presented with right-sided facial droop and right-sided upper extremity weakness.  Workup suggestive of left MCA and PCA territory acute infarct.                                                                  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  Does have some surrounding petechial hemorrhages around the infarct territory, CTA noted, currently on Brilinta along with statin, Zetia for better LDL  control, note patient was not compliant with his statin or his home Plavix, he was strictly counseled to abstain from alcohol or any smoking.    Seen by stroke team placed on Brilinta for 1 month minimum thereafter switch to Plavix, continue Crestor and Zetia with compliance, LDL was above goal, A1c was 6.5, patient requested to follow-up with his PCP and stroke team postdischarge.  We were trying to get him qualified for a rehab but he is not interested in it and wants to go home, will try and order home PT, OT and speech if he qualifies for the same.  PCP to monitor secondary to his factors closely.  He is being discharged with 1 month of Brilinta thereafter he should be switched to Plavix, if he can afford ideally 90 days of Brilinta and then Plavix.  However he has no insurance and trying to provide him 30-day Brilinta supply from the hospital.      Essential hypertension - CVA 36 hours ago, gradually reintroducing blood  Dennis Hopkins [id number] DOB: 1961/03/24 DOA: 04/12/2023  PCP: Donato Schultz, DO  Admit date: 04/12/2023  Discharge date: 04/15/2023  Admitted From: [facility]   Disposition:  Home    Recommendations for Outpatient Follow-up:   Follow up with PCP in 1-2 weeks  PCP Please obtain BMP/CBC, 2 view CXR in 1week,  (see Discharge instructions)   PCP Please follow up on the following pending results: 1 month of Brilinta then switch to Plavix, make sure he is taking his Crestor and Zetia.  Strict outpatient risk modification of his stroke risk factors.   Home Health: PT, OT and speech therapy if he qualifies Equipment/Devices: As below Consultations: Neuro Discharge Condition: Stable    CODE STATUS: Full    Diet Recommendation: Heart Healthy Low Carb    Chief Complaint  Patient presents with   Facial Droop     Brief history of present illness from the day of admission and additional interim summary    62 y.o. male with medical history significant of CAD status post CABG, hypertension, insulin-dependent DM type II, hyperlipidemia, aspirin allergy, chronic alcohol use, and obesity presented to [facility] emergency department with complaining of right-sided weakness and last known normal 10 PM 04/11/2023.  Patient presented with right-sided facial droop and right-sided upper extremity weakness.  Workup suggestive of left MCA and PCA territory acute infarct.                                                                  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  Does have some surrounding petechial hemorrhages around the infarct territory, CTA noted, currently on Brilinta along with statin, Zetia for better LDL  control, note patient was not compliant with his statin or his home Plavix, he was strictly counseled to abstain from alcohol or any smoking.    Seen by stroke team placed on Brilinta for 1 month minimum thereafter switch to Plavix, continue Crestor and Zetia with compliance, LDL was above goal, A1c was 6.5, patient requested to follow-up with his PCP and stroke team postdischarge.  We were trying to get him qualified for a rehab but he is not interested in it and wants to go home, will try and order home PT, OT and speech if he qualifies for the same.  PCP to monitor secondary to his factors closely.  He is being discharged with 1 month of Brilinta thereafter he should be switched to Plavix, if he can afford ideally 90 days of Brilinta and then Plavix.  However he has no insurance and trying to provide him 30-day Brilinta supply from the hospital.      Essential hypertension - CVA 36 hours ago, gradually reintroducing blood  Dennis Hopkins [id number] DOB: 1961/03/24 DOA: 04/12/2023  PCP: Donato Schultz, DO  Admit date: 04/12/2023  Discharge date: 04/15/2023  Admitted From: [facility]   Disposition:  Home    Recommendations for Outpatient Follow-up:   Follow up with PCP in 1-2 weeks  PCP Please obtain BMP/CBC, 2 view CXR in 1week,  (see Discharge instructions)   PCP Please follow up on the following pending results: 1 month of Brilinta then switch to Plavix, make sure he is taking his Crestor and Zetia.  Strict outpatient risk modification of his stroke risk factors.   Home Health: PT, OT and speech therapy if he qualifies Equipment/Devices: As below Consultations: Neuro Discharge Condition: Stable    CODE STATUS: Full    Diet Recommendation: Heart Healthy Low Carb    Chief Complaint  Patient presents with   Facial Droop     Brief history of present illness from the day of admission and additional interim summary    62 y.o. male with medical history significant of CAD status post CABG, hypertension, insulin-dependent DM type II, hyperlipidemia, aspirin allergy, chronic alcohol use, and obesity presented to [facility] emergency department with complaining of right-sided weakness and last known normal 10 PM 04/11/2023.  Patient presented with right-sided facial droop and right-sided upper extremity weakness.  Workup suggestive of left MCA and PCA territory acute infarct.                                                                  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  Does have some surrounding petechial hemorrhages around the infarct territory, CTA noted, currently on Brilinta along with statin, Zetia for better LDL  control, note patient was not compliant with his statin or his home Plavix, he was strictly counseled to abstain from alcohol or any smoking.    Seen by stroke team placed on Brilinta for 1 month minimum thereafter switch to Plavix, continue Crestor and Zetia with compliance, LDL was above goal, A1c was 6.5, patient requested to follow-up with his PCP and stroke team postdischarge.  We were trying to get him qualified for a rehab but he is not interested in it and wants to go home, will try and order home PT, OT and speech if he qualifies for the same.  PCP to monitor secondary to his factors closely.  He is being discharged with 1 month of Brilinta thereafter he should be switched to Plavix, if he can afford ideally 90 days of Brilinta and then Plavix.  However he has no insurance and trying to provide him 30-day Brilinta supply from the hospital.      Essential hypertension - CVA 36 hours ago, gradually reintroducing blood  Dennis Hopkins [id number] DOB: 1961/03/24 DOA: 04/12/2023  PCP: Donato Schultz, DO  Admit date: 04/12/2023  Discharge date: 04/15/2023  Admitted From: [facility]   Disposition:  Home    Recommendations for Outpatient Follow-up:   Follow up with PCP in 1-2 weeks  PCP Please obtain BMP/CBC, 2 view CXR in 1week,  (see Discharge instructions)   PCP Please follow up on the following pending results: 1 month of Brilinta then switch to Plavix, make sure he is taking his Crestor and Zetia.  Strict outpatient risk modification of his stroke risk factors.   Home Health: PT, OT and speech therapy if he qualifies Equipment/Devices: As below Consultations: Neuro Discharge Condition: Stable    CODE STATUS: Full    Diet Recommendation: Heart Healthy Low Carb    Chief Complaint  Patient presents with   Facial Droop     Brief history of present illness from the day of admission and additional interim summary    62 y.o. male with medical history significant of CAD status post CABG, hypertension, insulin-dependent DM type II, hyperlipidemia, aspirin allergy, chronic alcohol use, and obesity presented to [facility] emergency department with complaining of right-sided weakness and last known normal 10 PM 04/11/2023.  Patient presented with right-sided facial droop and right-sided upper extremity weakness.  Workup suggestive of left MCA and PCA territory acute infarct.                                                                  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  Does have some surrounding petechial hemorrhages around the infarct territory, CTA noted, currently on Brilinta along with statin, Zetia for better LDL  control, note patient was not compliant with his statin or his home Plavix, he was strictly counseled to abstain from alcohol or any smoking.    Seen by stroke team placed on Brilinta for 1 month minimum thereafter switch to Plavix, continue Crestor and Zetia with compliance, LDL was above goal, A1c was 6.5, patient requested to follow-up with his PCP and stroke team postdischarge.  We were trying to get him qualified for a rehab but he is not interested in it and wants to go home, will try and order home PT, OT and speech if he qualifies for the same.  PCP to monitor secondary to his factors closely.  He is being discharged with 1 month of Brilinta thereafter he should be switched to Plavix, if he can afford ideally 90 days of Brilinta and then Plavix.  However he has no insurance and trying to provide him 30-day Brilinta supply from the hospital.      Essential hypertension - CVA 36 hours ago, gradually reintroducing blood  as: NIZORAL   triamcinolone cream 0.1 % Commonly known as: KENALOG       TAKE these medications    dapagliflozin propanediol 10 MG Tabs tablet Commonly known as: Farxiga Take 1 tablet (10 mg total) by mouth daily before breakfast.   Dexcom G7 Receiver Devi As directed   Dexcom G7 Sensor Misc Use to check blood glucose at least 4 times daily. Change sensor every 10 days.   ezetimibe 10 MG tablet Commonly known as: Zetia Take 1 tablet (10 mg total) by mouth daily.   folic acid 1 MG tablet Commonly known as: FOLVITE Take 1 tablet (1 mg total) by mouth daily. Start taking on: April 16, 2023   furosemide 20 MG tablet Commonly known as: LASIX Take 1 tablet (20 mg total) by mouth daily.   gabapentin 100 MG capsule Commonly known as: NEURONTIN TAKE 1 TO 3 CAPSULES BY MOUTH THREE TIMES DAILY   HumaLOG KwikPen 100 UNIT/ML KwikPen Generic drug: insulin lispro INJECT 8 TO 18 UNITS SUBCUTANEOUSLY THREE TIMES DAILY What changed: See the new instructions.   Insulin Pen Needle 32G X 4 MM Misc 1 Device by Does not apply route in the morning, at noon, in the evening, and at bedtime.   Lantus SoloStar 100 UNIT/ML Solostar Pen Generic drug: insulin glargine Inject 40 Units into the skin daily.   metoprolol 200 MG 24 hr tablet Commonly known as: TOPROL-XL Take 1 tablet (200 mg total) by mouth daily. Start taking on: April 16, 2023   rosuvastatin 40 MG tablet Commonly known as: CRESTOR Take 1  tablet (40 mg total) by mouth daily.   thiamine 100 MG tablet Commonly known as: Vitamin B-1 Take 1 tablet (100 mg total) by mouth daily. Start taking on: April 16, 2023   ticagrelor 90 MG Tabs tablet Commonly known as: BRILINTA Take 1 tablet (90 mg total) by mouth 2 (two) times daily.   tiZANidine 4 MG tablet Commonly known as: Zanaflex Take 1 tablet (4 mg total) by mouth every 8 (eight) hours as needed for muscle spasms.   traMADol 50 MG tablet Commonly known as: ULTRAM Take 1 tablet (50 mg total) by mouth every 6 (six) hours as needed for moderate pain.         Follow-up Information     Donato Schultz, DO. Schedule an appointment as soon as possible for a visit in 1 week(s).   Specialty: Family Medicine Contact information: [phone] [address] [place] [address] [phone]         Micki Riley, MD. Schedule an appointment as soon as possible for a visit in 1 month(s).   Specialties: Neurology, Radiology Contact information: [address] [place] [address] [phone]                 Major procedures and Radiology Reports - PLEASE review detailed and final reports thoroughly  -       ECHOCARDIOGRAM COMPLETE BUBBLE STUDY  Result Date: 04/14/2023    ECHOCARDIOGRAM REPORT   Patient Name:   BHAVIN NIELSEN Date of Exam: 04/14/2023 Medical Rec #:  [id number]       Height:       70.0 in Accession #:    [id number]      Weight:       234.3 lb Date of Birth:  July 25, 1960       BSA:          2.233 m Patient Age:  Dennis Hopkins [id number] DOB: 1961/03/24 DOA: 04/12/2023  PCP: Donato Schultz, DO  Admit date: 04/12/2023  Discharge date: 04/15/2023  Admitted From: [facility]   Disposition:  Home    Recommendations for Outpatient Follow-up:   Follow up with PCP in 1-2 weeks  PCP Please obtain BMP/CBC, 2 view CXR in 1week,  (see Discharge instructions)   PCP Please follow up on the following pending results: 1 month of Brilinta then switch to Plavix, make sure he is taking his Crestor and Zetia.  Strict outpatient risk modification of his stroke risk factors.   Home Health: PT, OT and speech therapy if he qualifies Equipment/Devices: As below Consultations: Neuro Discharge Condition: Stable    CODE STATUS: Full    Diet Recommendation: Heart Healthy Low Carb    Chief Complaint  Patient presents with   Facial Droop     Brief history of present illness from the day of admission and additional interim summary    62 y.o. male with medical history significant of CAD status post CABG, hypertension, insulin-dependent DM type II, hyperlipidemia, aspirin allergy, chronic alcohol use, and obesity presented to [facility] emergency department with complaining of right-sided weakness and last known normal 10 PM 04/11/2023.  Patient presented with right-sided facial droop and right-sided upper extremity weakness.  Workup suggestive of left MCA and PCA territory acute infarct.                                                                  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  Does have some surrounding petechial hemorrhages around the infarct territory, CTA noted, currently on Brilinta along with statin, Zetia for better LDL  control, note patient was not compliant with his statin or his home Plavix, he was strictly counseled to abstain from alcohol or any smoking.    Seen by stroke team placed on Brilinta for 1 month minimum thereafter switch to Plavix, continue Crestor and Zetia with compliance, LDL was above goal, A1c was 6.5, patient requested to follow-up with his PCP and stroke team postdischarge.  We were trying to get him qualified for a rehab but he is not interested in it and wants to go home, will try and order home PT, OT and speech if he qualifies for the same.  PCP to monitor secondary to his factors closely.  He is being discharged with 1 month of Brilinta thereafter he should be switched to Plavix, if he can afford ideally 90 days of Brilinta and then Plavix.  However he has no insurance and trying to provide him 30-day Brilinta supply from the hospital.      Essential hypertension - CVA 36 hours ago, gradually reintroducing blood  as: NIZORAL   triamcinolone cream 0.1 % Commonly known as: KENALOG       TAKE these medications    dapagliflozin propanediol 10 MG Tabs tablet Commonly known as: Farxiga Take 1 tablet (10 mg total) by mouth daily before breakfast.   Dexcom G7 Receiver Devi As directed   Dexcom G7 Sensor Misc Use to check blood glucose at least 4 times daily. Change sensor every 10 days.   ezetimibe 10 MG tablet Commonly known as: Zetia Take 1 tablet (10 mg total) by mouth daily.   folic acid 1 MG tablet Commonly known as: FOLVITE Take 1 tablet (1 mg total) by mouth daily. Start taking on: April 16, 2023   furosemide 20 MG tablet Commonly known as: LASIX Take 1 tablet (20 mg total) by mouth daily.   gabapentin 100 MG capsule Commonly known as: NEURONTIN TAKE 1 TO 3 CAPSULES BY MOUTH THREE TIMES DAILY   HumaLOG KwikPen 100 UNIT/ML KwikPen Generic drug: insulin lispro INJECT 8 TO 18 UNITS SUBCUTANEOUSLY THREE TIMES DAILY What changed: See the new instructions.   Insulin Pen Needle 32G X 4 MM Misc 1 Device by Does not apply route in the morning, at noon, in the evening, and at bedtime.   Lantus SoloStar 100 UNIT/ML Solostar Pen Generic drug: insulin glargine Inject 40 Units into the skin daily.   metoprolol 200 MG 24 hr tablet Commonly known as: TOPROL-XL Take 1 tablet (200 mg total) by mouth daily. Start taking on: April 16, 2023   rosuvastatin 40 MG tablet Commonly known as: CRESTOR Take 1  tablet (40 mg total) by mouth daily.   thiamine 100 MG tablet Commonly known as: Vitamin B-1 Take 1 tablet (100 mg total) by mouth daily. Start taking on: April 16, 2023   ticagrelor 90 MG Tabs tablet Commonly known as: BRILINTA Take 1 tablet (90 mg total) by mouth 2 (two) times daily.   tiZANidine 4 MG tablet Commonly known as: Zanaflex Take 1 tablet (4 mg total) by mouth every 8 (eight) hours as needed for muscle spasms.   traMADol 50 MG tablet Commonly known as: ULTRAM Take 1 tablet (50 mg total) by mouth every 6 (six) hours as needed for moderate pain.         Follow-up Information     Donato Schultz, DO. Schedule an appointment as soon as possible for a visit in 1 week(s).   Specialty: Family Medicine Contact information: [phone] [address] [place] [address] [phone]         Micki Riley, MD. Schedule an appointment as soon as possible for a visit in 1 month(s).   Specialties: Neurology, Radiology Contact information: [address] [place] [address] [phone]                 Major procedures and Radiology Reports - PLEASE review detailed and final reports thoroughly  -       ECHOCARDIOGRAM COMPLETE BUBBLE STUDY  Result Date: 04/14/2023    ECHOCARDIOGRAM REPORT   Patient Name:   BHAVIN NIELSEN Date of Exam: 04/14/2023 Medical Rec #:  [id number]       Height:       70.0 in Accession #:    [id number]      Weight:       234.3 lb Date of Birth:  July 25, 1960       BSA:          2.233 m Patient Age:

## 2023-04-15 NOTE — TOC Transition Note (Signed)
Transition of Care [facility]) - CM/SW Discharge Note   Patient Details  Name: Dennis Hopkins MRN: [id number] Date of Birth: 1961-01-28  [facility] of [facility]) CM/SW Contact:  Lawerance Sabal, RN Phone Number: 04/15/2023, 10:35 AM   Clinical Narrative:     Unable to secure [facility] services due to payor source, discussed with attending, referral made to outpatient therapy, added to AVS.     Barriers to Discharge: Continued Medical Work up   Patient Goals and CMS Choice      Discharge Placement                         Discharge Plan and Services Additional resources added to the After Visit Summary for     Discharge Planning Services: CM Consult                                 Social Determinants of Health (SDOH) Interventions SDOH Screenings   Depression (PHQ2-9): Low Risk  (02/17/2023)  Financial Resource Strain: Low Risk  (11/22/2021)  Tobacco Use: Medium Risk (04/12/2023)     Readmission Risk Interventions     No data to display

## 2023-04-15 NOTE — Progress Notes (Signed)
Patient is unable to use taxi voucher because he cannot find his house keys, primary RN  to notified the patients NOK Janyth Pupa of situation and he reports his stepsister who lives close by will come to pick up patient and also has his house keys

## 2023-04-15 NOTE — Progress Notes (Signed)
Followed up with patients grandson who reports that his stepsister is on the way to pick patient up

## 2023-04-17 ENCOUNTER — Telehealth: Payer: Self-pay | Admitting: *Deleted

## 2023-04-17 ENCOUNTER — Other Ambulatory Visit: Payer: Self-pay | Admitting: Family Medicine

## 2023-04-17 DIAGNOSIS — M48061 Spinal stenosis, lumbar region without neurogenic claudication: Secondary | ICD-10-CM

## 2023-04-17 DIAGNOSIS — E785 Hyperlipidemia, unspecified: Secondary | ICD-10-CM | POA: Insufficient documentation

## 2023-04-17 LAB — GLUCOSE, CAPILLARY: Glucose-Capillary: 183 mg/dL — ABNORMAL HIGH (ref 70–99)

## 2023-04-17 MED ORDER — TRAMADOL HCL 50 MG PO TABS: 50.0000 mg | ORAL_TABLET | Freq: Four times a day (QID) | ORAL | 0 refills | Status: DC | PRN

## 2023-04-17 NOTE — Telephone Encounter (Signed)
Requesting: Tramadol Contract: n/a UDS: n/a Last OV: 02/17/23 Next OV: 04/20/23 Last Refill: 02/17/23, #90--0 RF Database:   Please advise

## 2023-04-17 NOTE — Telephone Encounter (Signed)
Medication: traMADol (ULTRAM) 50 MG tablet  Has the patient contacted their pharmacy? No.   Preferred Pharmacy:    [id number] - [place], [place] - [address], [place] [address] Phone: [phone]  Fax: [phone]

## 2023-04-17 NOTE — Transitions of Care (Post Inpatient/ED Visit) (Signed)
04/17/2023  Name: Dennis Hopkins MRN: [id number] DOB: 05/02/1961  Today's TOC FU Call Status: Today's TOC FU Call Status:: Unsuccessful Call (1st Attempt) Unsuccessful Call (1st Attempt) Date: 04/17/23  Attempted to reach the patient regarding the most recent Inpatient visit; left HIPAA compliant voice message requesting call back  Follow Up Plan: Additional outreach attempts will be made to reach the patient to complete the Transitions of Care (Post Inpatient visit) call.   Caryl Pina, RN, BSN, Media planner  Transitions of Care  [facility] - [facility] [phone]: direct office

## 2023-04-18 ENCOUNTER — Ambulatory Visit: Payer: PRIVATE HEALTH INSURANCE

## 2023-04-18 ENCOUNTER — Telehealth: Payer: Self-pay | Admitting: *Deleted

## 2023-04-18 NOTE — Transitions of Care (Post Inpatient/ED Visit) (Signed)
04/18/2023  Name: TAELON DELROSARIO MRN: [id number] DOB: August 04, 1960  Today's TOC FU Call Status: Today's TOC FU Call Status:: Unsuccessful Call (2nd Attempt) Unsuccessful Call (2nd Attempt) Date: 04/18/23  Attempted to reach the patient regarding the most recent Inpatient visit; left HIPAA compliant voice message requesting call back  Follow Up Plan: Additional outreach attempts will be made to reach the patient to complete the Transitions of Care (Post Inpatient visit) call.   Caryl Pina, RN, BSN, Media planner  Transitions of Care  [facility] - [facility] [phone]: direct office

## 2023-04-19 ENCOUNTER — Telehealth: Payer: Self-pay | Admitting: *Deleted

## 2023-04-19 NOTE — Transitions of Care (Post Inpatient/ED Visit) (Signed)
04/19/2023  Name: Dennis Hopkins MRN: [id number] DOB: May 24, 1961  Today's TOC FU Call Status: Today's TOC FU Call Status:: Unsuccessful Call (3rd Attempt) Unsuccessful Call (3rd Attempt) Date: 04/19/23  Attempted to reach the patient regarding the most recent Inpatient visit; left HIPAA compliant voice message requesting call back  Follow Up Plan: No further outreach attempts will be made at this time. We have been unable to contact the patient.  Caryl Pina, RN, BSN, Media planner  Transitions of Care  [facility] - [facility] [phone]: direct office

## 2023-04-20 ENCOUNTER — Ambulatory Visit (INDEPENDENT_AMBULATORY_CARE_PROVIDER_SITE_OTHER): Payer: PRIVATE HEALTH INSURANCE | Admitting: Family Medicine

## 2023-04-20 ENCOUNTER — Telehealth: Payer: Self-pay | Admitting: Family Medicine

## 2023-04-20 ENCOUNTER — Ambulatory Visit: Payer: PRIVATE HEALTH INSURANCE | Admitting: Physical Therapy

## 2023-04-20 ENCOUNTER — Encounter: Payer: Self-pay | Admitting: Family Medicine

## 2023-04-20 ENCOUNTER — Encounter: Payer: Self-pay | Admitting: Physical Therapy

## 2023-04-20 VITALS — BP 130/80 | HR 61 | Temp 98.2°F | Resp 18 | Ht 70.0 in | Wt 223.8 lb

## 2023-04-20 DIAGNOSIS — I69328 Other speech and language deficits following cerebral infarction: Secondary | ICD-10-CM

## 2023-04-20 DIAGNOSIS — R293 Abnormal posture: Secondary | ICD-10-CM

## 2023-04-20 DIAGNOSIS — R262 Difficulty in walking, not elsewhere classified: Secondary | ICD-10-CM | POA: Diagnosis present

## 2023-04-20 DIAGNOSIS — M62830 Muscle spasm of back: Secondary | ICD-10-CM

## 2023-04-20 DIAGNOSIS — M48061 Spinal stenosis, lumbar region without neurogenic claudication: Secondary | ICD-10-CM | POA: Diagnosis not present

## 2023-04-20 DIAGNOSIS — I69351 Hemiplegia and hemiparesis following cerebral infarction affecting right dominant side: Secondary | ICD-10-CM

## 2023-04-20 DIAGNOSIS — R29898 Other symptoms and signs involving the musculoskeletal system: Secondary | ICD-10-CM

## 2023-04-20 DIAGNOSIS — M5416 Radiculopathy, lumbar region: Secondary | ICD-10-CM

## 2023-04-20 DIAGNOSIS — M6281 Muscle weakness (generalized): Secondary | ICD-10-CM

## 2023-04-20 DIAGNOSIS — I639 Cerebral infarction, unspecified: Secondary | ICD-10-CM

## 2023-04-20 DIAGNOSIS — R319 Hematuria, unspecified: Secondary | ICD-10-CM

## 2023-04-20 DIAGNOSIS — M5459 Other low back pain: Secondary | ICD-10-CM | POA: Diagnosis present

## 2023-04-20 LAB — COMPREHENSIVE METABOLIC PANEL
ALT: 39 U/L (ref 0–53)
AST: 33 U/L (ref 0–37)
Albumin: 4.5 g/dL (ref 3.5–5.2)
Alkaline Phosphatase: 74 U/L (ref 39–117)
BUN: 21 mg/dL (ref 6–23)
CO2: 29 meq/L (ref 19–32)
Calcium: 10.4 mg/dL (ref 8.4–10.5)
Chloride: 99 meq/L (ref 96–112)
Creatinine, Ser: 1.48 mg/dL (ref 0.40–1.50)
GFR: 50.54 mL/min — ABNORMAL LOW (ref >60.00)
Glucose, Bld: 139 mg/dL — ABNORMAL HIGH (ref 70–99)
Potassium: 4.2 meq/L (ref 3.5–5.1)
Sodium: 139 meq/L (ref 135–145)
Total Bilirubin: 0.7 mg/dL (ref 0.2–1.2)
Total Protein: 8.1 g/dL (ref 6.0–8.3)

## 2023-04-20 LAB — CBC WITH DIFFERENTIAL/PLATELET
Basophils Absolute: 0.1 10*3/uL (ref 0.0–0.1)
Basophils Relative: 0.9 % (ref 0.0–3.0)
Eosinophils Absolute: 0.4 10*3/uL (ref 0.0–0.7)
Eosinophils Relative: 4.3 % (ref 0.0–5.0)
HCT: 49.3 % (ref 39.0–52.0)
Hemoglobin: 16 g/dL (ref 13.0–17.0)
Lymphocytes Relative: 23.6 % (ref 12.0–46.0)
Lymphs Abs: 2.4 10*3/uL (ref 0.7–4.0)
MCHC: 32.4 g/dL (ref 30.0–36.0)
MCV: 89.9 fL (ref 78.0–100.0)
Monocytes Absolute: 1 10*3/uL (ref 0.1–1.0)
Monocytes Relative: 10.4 % (ref 3.0–12.0)
Neutro Abs: 6.1 10*3/uL (ref 1.4–7.7)
Neutrophils Relative %: 60.8 % (ref 43.0–77.0)
Platelets: 242 10*3/uL (ref 150.0–400.0)
RBC: 5.48 Mil/uL (ref 4.22–5.81)
RDW: 13.8 % (ref 11.5–15.5)
WBC: 10.1 10*3/uL (ref 4.0–10.5)

## 2023-04-20 MED ORDER — METHOCARBAMOL 500 MG PO TABS: 500.0000 mg | ORAL_TABLET | Freq: Four times a day (QID) | ORAL | 2 refills | Status: DC

## 2023-04-20 NOTE — Telephone Encounter (Signed)
Patient's caregiver called and stated that he needs a referral to the outpatient rehab at [facility] in order for him to make an appointment for occupational therapy for his hand. Please advise.

## 2023-04-20 NOTE — Progress Notes (Signed)
Established Patient Office Visit  Subjective   Patient ID: Dennis Hopkins, male    DOB: August 15, 1960  Age: 62 y.o. MRN: [id number]  Chief Complaint  Patient presents with   Hospitalization Follow-up    Stroke    HPI Discussed the use of AI scribe software for clinical note transcription with the patient, who gave verbal consent to proceed.  History of Present Illness   The patient, with a history of stroke, presents for follow-up. He reports significant improvement since his hospitalization, with regained movement in his right hand, which was previously immobile. He describes his hand as still weak but functional. He denies any issues with his leg. His speech is affected, but he is able to communicate effectively. He has been trying to exercise his hand at home using a device given to him at the hospital.  He also reports blood in his urine, which has improved but is still present. He denies having a catheter during his hospital stay. He has been managing at home alone, with assistance from neighbors who bring meals. He has been able to drive short distances and go to the store.  He has been taking a muscle relaxer, Zenatane, which he finds too strong and requests a change. He also takes Atoprolol and Tramadol, and has no issues with these medications. He has physical therapy set up for his back, and is awaiting home therapy for his stroke recovery. He has upcoming appointments with a neurologist and a cardiologist.      Patient Active Problem List   Diagnosis Date Noted   Hyperlipemia    Ischemic cerebrovascular accident (CVA) (HCC) 04/12/2023   History of CAD (coronary artery disease) 04/12/2023   Chronic alcohol use 04/12/2023   Hypokalemia 04/12/2023   Morbid obesity (HCC) 04/12/2023   Peripheral neuropathy 04/12/2023   Eczema 04/12/2022   Lumbar foraminal stenosis 01/05/2022   Type 2 diabetes mellitus with diabetic peripheral angiopathy without gangrene, without long-term  current use of insulin (HCC) 09/06/2021   Calcified granuloma of lung 09/06/2021   Peripheral neuropathic pain 09/06/2021   Type 2 diabetes mellitus with hyperglycemia, without long-term current use of insulin (HCC) 04/13/2021   Diabetes mellitus (HCC) 04/13/2021   Essential hypertension    Hyperlipidemia    Chickenpox    Urinary frequency 08/24/2020   Snoring 08/24/2020   Rash 08/24/2020   Acute left-sided low back pain with right-sided sciatica 02/14/2019   Insulin dependent type 2 diabetes mellitus (HCC) 07/19/2016   Diabetes (HCC) 01/18/2016   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   Coronary artery disease 01/12/2016   Exertional angina (HCC)    Chest pain 11/11/2015   HTN (hypertension) 10/20/2015   Hyperlipidemia LDL goal <70 10/20/2015   Preventative health care 09/29/2014   Obesity (BMI 30-39.9) 09/26/2013   Tobacco use disorder 09/26/2013   Past Medical History:  Diagnosis Date   Acute CVA (cerebrovascular accident) (HCC) 04/12/2023   Acute left-sided low back pain with right-sided sciatica 02/14/2019   Calcified granuloma of lung 09/06/2021   Chest pain 11/11/2015   Chickenpox    Chronic alcohol use 04/12/2023   Coronary artery disease 01/12/2016   Diabetes (HCC) 01/18/2016   Type 2     Diabetes mellitus (HCC) 04/13/2021   Eczema 04/12/2022   Essential hypertension    Exertional angina (HCC)    History of CAD (coronary artery disease) 04/12/2023   HTN (hypertension) 10/20/2015   Hyperlipemia    Hyperlipidemia    Hyperlipidemia LDL goal <70 10/20/2015  Hypokalemia 04/12/2023   Insulin dependent type 2 diabetes mellitus (HCC) 07/19/2016   Kidney stones 06/21/2011   Lumbar foraminal stenosis 01/05/2022   Morbid obesity (HCC) 04/12/2023   Obesity (BMI 30-39.9) 09/26/2013   Peripheral neuropathic pain 09/06/2021   Peripheral neuropathy 04/12/2023   Preventative health care 09/29/2014   Rash 08/24/2020   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   Snoring 08/24/2020   Tobacco use  disorder 09/26/2013   Type 2 diabetes mellitus with diabetic peripheral angiopathy without gangrene, without long-term current use of insulin (HCC) 09/06/2021   Type 2 diabetes mellitus with hyperglycemia, without long-term current use of insulin (HCC) 04/13/2021   Urinary frequency 08/24/2020   Past Surgical History:  Procedure Laterality Date   CARDIAC CATHETERIZATION N/A 01/07/2016   Procedure: Left Heart Cath and Coronary Angiography;  Surgeon: Corky Crafts, MD;  Location: [facility] INVASIVE CV LAB;  Service: Cardiovascular;  Laterality: N/A;   CORONARY ARTERY BYPASS GRAFT N/A 01/13/2016   Procedure: CORONARY ARTERY BYPASS GRAFTING (CABG) x4 Endoscopic Harvesting of the Right Greater Saphenous Vein;  Surgeon: Loreli Slot, MD;  Location: [facility] OR;  Service: Open Heart Surgery;  Laterality: N/A;   TEE WITHOUT CARDIOVERSION N/A 01/13/2016   Procedure: TRANSESOPHAGEAL ECHOCARDIOGRAM (TEE);  Surgeon: Loreli Slot, MD;  Location: [facility] OR;  Service: Open Heart Surgery;  Laterality: N/A;   TONSILLECTOMY     Social History   Tobacco Use   Smoking status: Former    Current packs/day: 0.00    Average packs/day: 2.0 packs/day for 35.0 years (70.0 ttl pk-yrs)    Types: Cigarettes    Start date: 12/22/1978    Quit date: 12/21/2013    Years since quitting: 9.3   Smokeless tobacco: Never  Vaping Use   Vaping status: Never Used  Substance Use Topics   Alcohol use: Yes    Alcohol/week: 0.0 standard drinks of alcohol    Comment: 1-2 drinks per night- wife reports he drinks more    Drug use: No   Social History   Socioeconomic History   Marital status: Widowed    Spouse name: Not on file   Number of children: 1   Years of education: Not on file   Highest education level: Not on file  Occupational History   Not on file  Tobacco Use   Smoking status: Former    Current packs/day: 0.00    Average packs/day: 2.0 packs/day for 35.0 years (70.0 ttl pk-yrs)    Types: Cigarettes    Start  date: 12/22/1978    Quit date: 12/21/2013    Years since quitting: 9.3   Smokeless tobacco: Never  Vaping Use   Vaping status: Never Used  Substance and Sexual Activity   Alcohol use: Yes    Alcohol/week: 0.0 standard drinks of alcohol    Comment: 1-2 drinks per night- wife reports he drinks more    Drug use: No   Sexual activity: Yes    Partners: Female  Other Topics Concern   Not on file  Social History Narrative   Not on file   Social Determinants of Health   Financial Resource Strain: Low Risk  (11/22/2021)   Overall Financial Resource Strain (CARDIA)    Difficulty of Paying Living Expenses: Not very hard  Food Insecurity: Not on file  Transportation Needs: Not on file  Physical Activity: Not on file  Stress: Not on file  Social Connections: Not on file  Intimate Partner Violence: Not on file   Family Status  Relation Name Status   Mother  Deceased   Father  Deceased   Brother  Alive   Sister  Alive   Sister  Alive  No partnership data on file   Family History  Problem Relation Age of Onset   Arthritis Mother    Diabetes Mother    Arthritis Father    Hyperlipidemia Father    Diabetes Father    Diabetes Brother    Allergies  Allergen Reactions   Asa [Aspirin] Anaphylaxis, Swelling and Other (See Comments)    Lips swelling      Review of Systems  Constitutional:  Negative for fever and malaise/fatigue.  HENT:  Negative for congestion.   Eyes:  Negative for blurred vision.  Respiratory:  Negative for cough and shortness of breath.   Cardiovascular:  Negative for chest pain, palpitations and leg swelling.  Gastrointestinal:  Negative for abdominal pain, blood in stool, nausea and vomiting.  Genitourinary:  Negative for dysuria and frequency.  Musculoskeletal:  Negative for back pain and falls.  Skin:  Negative for rash.  Neurological:  Positive for speech change. Negative for dizziness, loss of consciousness and headaches.  Endo/Heme/Allergies:  Negative  for environmental allergies.  Psychiatric/Behavioral:  Negative for depression. The patient is not nervous/anxious.       Objective:     BP 130/80 (BP Location: Left Arm, Patient Position: Sitting, Cuff Size: Normal)   Pulse 61   Temp 98.2 F (36.8 C) (Oral)   Resp 18   Ht 5\' 10"  (1.778 m)   Wt 223 lb 12.8 oz (101.5 kg)   SpO2 98%   BMI 32.11 kg/m  BP Readings from Last 3 Encounters:  04/20/23 130/80  04/15/23 (!) 167/87  02/17/23 132/86   Wt Readings from Last 3 Encounters:  04/20/23 223 lb 12.8 oz (101.5 kg)  04/12/23 234 lb 5.6 oz (106.3 kg)  02/17/23 234 lb 3.2 oz (106.2 kg)   SpO2 Readings from Last 3 Encounters:  04/20/23 98%  04/15/23 95%  02/17/23 99%      Physical Exam Vitals and nursing note reviewed.  Constitutional:      General: He is not in acute distress.    Appearance: Normal appearance. He is well-developed.  HENT:     Head: Normocephalic and atraumatic.     Right Ear: Tympanic membrane, ear canal and external ear normal. There is no impacted cerumen.     Left Ear: Tympanic membrane, ear canal and external ear normal. There is no impacted cerumen.     Nose: Nose normal.     Mouth/Throat:     Mouth: Mucous membranes are moist.     Pharynx: Oropharynx is clear. No oropharyngeal exudate or posterior oropharyngeal erythema.  Eyes:     General: No scleral icterus.       Right eye: No discharge.        Left eye: No discharge.     Conjunctiva/sclera: Conjunctivae normal.     Pupils: Pupils are equal, round, and reactive to light.  Neck:     Thyroid: No thyromegaly.     Vascular: No JVD.  Cardiovascular:     Rate and Rhythm: Normal rate and regular rhythm.     Heart sounds: Normal heart sounds. No murmur heard. Pulmonary:     Effort: Pulmonary effort is normal. No respiratory distress.     Breath sounds: Normal breath sounds.  Abdominal:     General: Bowel sounds are normal. There is no distension.  Palpations: Abdomen is soft. There is no  mass.     Tenderness: There is no abdominal tenderness. There is no guarding or rebound.  Musculoskeletal:        General: Normal range of motion.     Cervical back: Normal range of motion and neck supple.     Right lower leg: No edema.     Left lower leg: No edema.  Lymphadenopathy:     Cervical: No cervical adenopathy.  Skin:    General: Skin is warm and dry.     Findings: No erythema or rash.  Neurological:     Mental Status: He is alert and oriented to person, place, and time.     Cranial Nerves: No cranial nerve deficit.     Motor: No abnormal muscle tone.     Deep Tendon Reflexes: Reflexes are normal and symmetric. Reflexes normal.     Comments: R facial droop and slurred speech ----  no change since hosp d/c  Psychiatric:        Mood and Affect: Mood normal.        Behavior: Behavior normal.        Thought Content: Thought content normal.        Judgment: Judgment normal.      No results found for any visits on 04/20/23.  Last CBC Lab Results  Component Value Date   WBC 7.6 04/13/2023   HGB 15.0 04/13/2023   HCT 44.3 04/13/2023   MCV 85.2 04/13/2023   MCH 28.8 04/13/2023   RDW 12.8 04/13/2023   PLT 163 04/13/2023   Last metabolic panel Lab Results  Component Value Date   GLUCOSE 159 (H) 04/15/2023   NA 137 04/15/2023   K 4.1 04/15/2023   CL 102 04/15/2023   CO2 23 04/15/2023   BUN 14 04/15/2023   CREATININE 0.82 04/15/2023   GFRNONAA >60 04/15/2023   CALCIUM 9.4 04/15/2023   PHOS 3.3 04/15/2023   PROT 7.7 04/13/2023   ALBUMIN 3.8 04/13/2023   BILITOT 1.4 (H) 04/13/2023   ALKPHOS 63 04/13/2023   AST 38 04/13/2023   ALT 50 (H) 04/13/2023   ANIONGAP 12 04/15/2023   Last lipids Lab Results  Component Value Date   CHOL 221 (H) 04/13/2023   HDL 38 (L) 04/13/2023   LDLCALC 162 (H) 04/13/2023   LDLDIRECT 173.0 02/07/2022   TRIG 107 04/13/2023   CHOLHDL 5.8 04/13/2023   Last hemoglobin A1c Lab Results  Component Value Date   HGBA1C 9.1 (H)  02/17/2023   Last thyroid functions Lab Results  Component Value Date   TSH 1.39 02/07/2022   Last vitamin D No results found for: "25OHVITD2", "25OHVITD3", "VD25OH" Last vitamin B12 and Folate Lab Results  Component Value Date   VITAMINB12 322 02/17/2021   FOLATE 11.6 02/17/2021      The ASCVD Risk score (Arnett DK, et al., 2019) failed to calculate for the following reasons:   The patient has a prior MI or stroke diagnosis    Assessment & Plan:   Problem List Items Addressed This Visit       Unprioritized   Lumbar foraminal stenosis   Relevant Medications   methocarbamol (ROBAXIN) 500 MG tablet   Ischemic cerebrovascular accident (CVA) (HCC) - Primary    Pt has PT this afternoon F/u neuro and cardiology      Relevant Orders   DG Chest 2 View   CBC with Differential/Platelet   Comprehensive metabolic panel   Other Visit Diagnoses  Hematuria, unspecified type       Relevant Orders   POCT Urinalysis Dipstick (Automated)     Assessment and Plan    Ischemic Stroke Recent left MCA/BCA stroke with residual right arm weakness and speech impairment. Improvement noted since hospitalization. Currently living alone with some assistance from neighbors. -Continue Crestor and Zetia as prescribed. -Physical, occupational, and speech therapy referrals have been made, but patient has not yet been contacted. Follow up on this issue. -Encourage patient to continue with home exercises and use of hand device at night to prevent stiffness and contracture.  Muscle Relaxant Side Effects Patient reports excessive sedation with Zenatane. -Change Zenatane to a different muscle relaxer. Monitor for efficacy and tolerability.  Hematuria Patient reports blood in urine. No recent catheterization. -Order urinalysis to assess for microscopic hematuria.  Follow-up Patient has appointments with neurology and cardiology on 05/15/2023. -Schedule follow-up appointment in 3 months to  assess recovery progress. -Order repeat labs and chest x-ray as requested by hospital team. -Check on status of physical therapy referral.        Return in about 3 months (around 07/21/2023).    Donato Schultz, DO

## 2023-04-20 NOTE — Addendum Note (Signed)
Addended by: Seabron Spates R on: 04/20/2023 05:01 PM   Modules accepted: Orders

## 2023-04-20 NOTE — Patient Instructions (Signed)
Driving After a Stroke Driving after a stroke can be dangerous because a stroke can cause physical, emotional, cognitive, and behavioral changes. Damage to your brain and other parts of your nervous system may affect your ability to drive. You may have weakness, stiffness, and pain. You also may have problems moving, talking, seeing, touching, or problem-solving. A stroke can also cause an inability to move, or paralysis, on one side of your body. How does a stroke affect my driving? A family member may be the first to notice that it is not safe for you to drive. You may have problems with: Your vision. Talking and communicating. Weakness, pain, and stiffness in your arms or legs. Reacting quickly to changes on the road. Using the steering wheel, pedals, and other parts of the car. Thinking while driving. Judgment on the road. How do I recognize changes in my ability to drive? If you do drive, signs that driving may be unsafe for you include: Driving too fast or too slow. Needing help from others while driving. Not paying attention to street signs or signals. Making bad decisions while driving. Not keeping enough distance between cars. Drifting into other lanes. Becoming confused, angry, or frustrated. Getting lost in familiar places. Having accidents while driving. What actions can I take to manage driving after a stroke?  Talk to your health care provider Ask your health care provider when it is safe for you to drive. Laws on driving after a stroke vary by state. Your health care provider may recommend that you: Get a driving evaluation to have your vision, thinking, reaction time, and driving skills tested. Take a driving rehabilitation program for people who have had a stroke. Take a driving class or a retraining program. Ask about safety devices for drivers Adaptive equipment refers to devices that can help people who have had a stroke to drive and do other activities. You may  need: A wheelchair-accessible car. Special hand controls in the car. Pedal extensions for the car. A seat base to help you stay positioned in your seat. Lifts and ramps to help you get in and out of the car. Where to find more information American Stroke Association: www.stroke.org Summary Damage to your brain and other parts of your nervous system may affect your ability to drive. Ask your health care provider when it is safe for you to drive. They may recommend that you get a driving evaluation or take a driving class. A family member may be the first to notice that it is not safe for you to drive. You may need adaptive equipment to drive safely. This information is not intended to replace advice given to you by your health care provider. Make sure you discuss any questions you have with your health care provider. Document Revised: 09/06/2021 Document Reviewed: 09/06/2021 Elsevier Patient Education  2024 [employer].

## 2023-04-20 NOTE — Progress Notes (Signed)
Established Patient Office Visit  Subjective   Patient ID: Dennis Hopkins, male    DOB: 30-Mar-1961  Age: 62 y.o. MRN: [id number]  Chief Complaint  Patient presents with   Hospitalization Follow-up    Stroke    HPI    ROS    Objective:     BP 130/80 (BP Location: Left Arm, Patient Position: Sitting, Cuff Size: Normal)   Pulse 61   Temp 98.2 F (36.8 C) (Oral)   Resp 18   Ht 5\' 10"  (1.778 m)   Wt 223 lb 12.8 oz (101.5 kg)   SpO2 98%   BMI 32.11 kg/m  BP Readings from Last 3 Encounters:  04/20/23 130/80  04/15/23 (!) 167/87  02/17/23 132/86   Wt Readings from Last 3 Encounters:  04/20/23 223 lb 12.8 oz (101.5 kg)  04/12/23 234 lb 5.6 oz (106.3 kg)  02/17/23 234 lb 3.2 oz (106.2 kg)   SpO2 Readings from Last 3 Encounters:  04/20/23 98%  04/15/23 95%  02/17/23 99%      Physical Exam   No results found for any visits on 04/20/23.    The ASCVD Risk score (Arnett DK, et al., 2019) failed to calculate for the following reasons:   The patient has a prior MI or stroke diagnosis    Assessment & Plan:   Problem List Items Addressed This Visit       Unprioritized   Lumbar foraminal stenosis   Relevant Medications   methocarbamol (ROBAXIN) 500 MG tablet   Other Visit Diagnoses     Ischemic cerebrovascular accident (CVA) (HCC)    -  Primary   Relevant Orders   Ambulatory referral to Home Health       No follow-ups on file.    Donato Schultz, DO

## 2023-04-20 NOTE — Assessment & Plan Note (Signed)
Pt has PT this afternoon F/u neuro and cardiology

## 2023-04-20 NOTE — Therapy (Signed)
OUTPATIENT PHYSICAL THERAPY RE-EVALUATION & TREATMENT   Patient Name: Dennis Hopkins MRN: [id number] DOB:1960-10-13, 62 y.o., male Today's Date: 04/20/2023  END OF SESSION:  PT End of Session - 04/20/23 1616     Visit Number 4    Date for PT Re-Evaluation 05/23/23    Authorization Type [employer]    PT Start Time 1616    PT Stop Time 1659    PT Time Calculation (min) 43 min    Activity Tolerance Patient tolerated treatment well    Behavior During Therapy WFL for tasks assessed/performed                 Past Medical History:  Diagnosis Date   Acute CVA (cerebrovascular accident) (HCC) 04/12/2023   Acute left-sided low back pain with right-sided sciatica 02/14/2019   Calcified granuloma of lung 09/06/2021   Chest pain 11/11/2015   Chickenpox    Chronic alcohol use 04/12/2023   Coronary artery disease 01/12/2016   Diabetes (HCC) 01/18/2016   Type 2     Diabetes mellitus (HCC) 04/13/2021   Eczema 04/12/2022   Essential hypertension    Exertional angina (HCC)    History of CAD (coronary artery disease) 04/12/2023   HTN (hypertension) 10/20/2015   Hyperlipemia    Hyperlipidemia    Hyperlipidemia LDL goal <70 10/20/2015   Hypokalemia 04/12/2023   Insulin dependent type 2 diabetes mellitus (HCC) 07/19/2016   Kidney stones 06/21/2011   Lumbar foraminal stenosis 01/05/2022   Morbid obesity (HCC) 04/12/2023   Obesity (BMI 30-39.9) 09/26/2013   Peripheral neuropathic pain 09/06/2021   Peripheral neuropathy 04/12/2023   Preventative health care 09/29/2014   Rash 08/24/2020   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   Snoring 08/24/2020   Tobacco use disorder 09/26/2013   Type 2 diabetes mellitus with diabetic peripheral angiopathy without gangrene, without long-term current use of insulin (HCC) 09/06/2021   Type 2 diabetes mellitus with hyperglycemia, without long-term current use of insulin (HCC) 04/13/2021   Urinary frequency 08/24/2020   Past Surgical History:  Procedure  Laterality Date   CARDIAC CATHETERIZATION N/A 01/07/2016   Procedure: Left Heart Cath and Coronary Angiography;  Surgeon: Corky Crafts, MD;  Location: [facility] INVASIVE CV LAB;  Service: Cardiovascular;  Laterality: N/A;   CORONARY ARTERY BYPASS GRAFT N/A 01/13/2016   Procedure: CORONARY ARTERY BYPASS GRAFTING (CABG) x4 Endoscopic Harvesting of the Right Greater Saphenous Vein;  Surgeon: Loreli Slot, MD;  Location: [facility] OR;  Service: Open Heart Surgery;  Laterality: N/A;   TEE WITHOUT CARDIOVERSION N/A 01/13/2016   Procedure: TRANSESOPHAGEAL ECHOCARDIOGRAM (TEE);  Surgeon: Loreli Slot, MD;  Location: [facility] OR;  Service: Open Heart Surgery;  Laterality: N/A;   TONSILLECTOMY     Patient Active Problem List   Diagnosis Date Noted   Hyperlipemia    Ischemic cerebrovascular accident (CVA) (HCC) 04/12/2023   History of CAD (coronary artery disease) 04/12/2023   Chronic alcohol use 04/12/2023   Hypokalemia 04/12/2023   Morbid obesity (HCC) 04/12/2023   Peripheral neuropathy 04/12/2023   Eczema 04/12/2022   Lumbar foraminal stenosis 01/05/2022   Type 2 diabetes mellitus with diabetic peripheral angiopathy without gangrene, without long-term current use of insulin (HCC) 09/06/2021   Calcified granuloma of lung 09/06/2021   Peripheral neuropathic pain 09/06/2021   Type 2 diabetes mellitus with hyperglycemia, without long-term current use of insulin (HCC) 04/13/2021   Diabetes mellitus (HCC) 04/13/2021   Essential hypertension    Hyperlipidemia    Chickenpox    Urinary frequency  08/24/2020   Snoring 08/24/2020   Rash 08/24/2020   Acute left-sided low back pain with right-sided sciatica 02/14/2019   Insulin dependent type 2 diabetes mellitus (HCC) 07/19/2016   Diabetes (HCC) 01/18/2016   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   Coronary artery disease 01/12/2016   Exertional angina (HCC)    Chest pain 11/11/2015   HTN (hypertension) 10/20/2015   Hyperlipidemia LDL goal <70 10/20/2015    Preventative health care 09/29/2014   Obesity (BMI 30-39.9) 09/26/2013   Tobacco use disorder 09/26/2013    PCP: Donato Schultz, DO   REFERRING PROVIDER: Donato Schultz, DO   REFERRING DIAG: [phone] (ICD-10-CM) - Lumbar foraminal stenosis   THERAPY DIAG:  Other low back pain  Radiculopathy, lumbar region  Abnormal posture  Muscle weakness (generalized)  Difficulty in walking, not elsewhere classified  Muscle spasm of back  RATIONALE FOR EVALUATION AND TREATMENT: Rehabilitation  ONSET DATE: ~1 year   NEXT MD VISIT: PRN - none scheduled   SUBJECTIVE:                                                                                                                                                                                                         SUBJECTIVE STATEMENT: Pt reports reports he was hospitalized for a stroke since his last PT visit. No f/u PT indicated per acute care PT assessment, but pt noting need for f/u OT for hand weakness/deficits and ST for speech and oral motor control. He is currently out of work due to the CVA pending STD. He will see the neurologist for post-CVA f/u on 05/15/23.  Referral for [facility] PT noted today but pt states he does not know if he will be having HH PT - informed pt that insurance will not cover both HH and OP simultaneously, so if he were to proceed with HH, we will need to D/C from OP PT. Pt stating he does not wish to try any further DN as he is not sure if this had any relation to his CVA.  PAIN: Are you having pain? Yes: NPRS scale:  8/10 Pain location: B low back Pain description: nagging Aggravating factors: prolonged sitting (driving a forklift at work), lifting, walking Relieving factors: heat, Rx meds - muscle relaxant makes him drowsy  PERTINENT HISTORY:  HTN; DM-II; CA s/p CABG x 4 - 2017; Peripheral neuropathic pain; Obesity  PRECAUTIONS: None  RED FLAGS: None  WEIGHT BEARING RESTRICTIONS: No  FALLS:   Has patient fallen in last 6 months? 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   Alignment:  Facet mediated anterolisthesis of L5 on S1 of 3 mm.   Vertebrae: No fracture, suspicious marrow lesion, or significant marrow edema. Hemangioma in the L5 vertebral body. Small L5 superior endplate Schmorl's node. Multilevel Modic type 2 endplate changes.   Conus medullaris and cauda equina: Conus extends to the L1 level. Conus and cauda equina appear normal.   Paraspinal and other soft tissues: Unremarkable.   Disc levels:   Mild diffuse lumbar disc desiccation. Moderate disc space narrowing at L3-4 and L4-5.   T12-L1: Mild disc bulging without stenosis.   L1-2: Disc bulging without stenosis.   L2-3: Disc bulging and mild facet hypertrophy without stenosis.   L3-4: Disc bulging results in borderline to mild bilateral lateral recess stenosis and mild-to-moderate bilateral neural foraminal stenosis without significant spinal stenosis.   L4-5: Disc bulging results in borderline to mild bilateral lateral recess stenosis and moderate bilateral neural foraminal stenosis without significant spinal stenosis.   L5-S1: Anterolisthesis with bulging uncovered disc and moderate right and severe left facet hypertrophy result in moderate bilateral neural foraminal stenosis without spinal stenosis.   IMPRESSION: 1. Multilevel lumbar disc and facet degeneration with moderate neural foraminal stenosis at L4-5 and L5-S1. 2. No significant  spinal stenosis.  11/02/21 - Lumbar spine x-ray: FINDINGS: There is no evidence for lumbar spine fracture. Spinal alignment is normal. There is mild-to-moderate disc space narrowing at L3-L4 and L4-L5, mildly progressed. Mild disc space narrowing at L5-S1 appears stable. Endplate osteophytes are seen throughout. There is facet arthropathy at L5-S1. Soft tissues are within normal limits.   IMPRESSION: 1. No acute fracture or malalignment. 2. Moderate multilevel degenerative changes throughout the spine have mildly progressed compared to 2020.  PATIENT SURVEYS:  Modified Oswestry 18 / 50 = 36.0 %  FOTO lumbar spine 52, predicted 60  SCREENING FOR RED FLAGS: Bowel or bladder incontinence: No Spinal tumors: No Cauda equina syndrome: No Compression fracture: No Abdominal aneurysm: No  COGNITION:  Overall cognitive status: Within functional limits for tasks assessed    SENSATION: Radicular pain down L>R LE to calves B diabetic peripheral neuropathy in B feet  MUSCLE LENGTH: Hamstrings: mod/severe tight L>R ITB: mod/severe tight L>R Piriformis: mod/severe tight L>R Hip flexors: mod tight B Quads: mod tight B Heelcord: NT  POSTURE:  rounded shoulders, forward head, decreased lumbar lordosis, and flexed trunk   PALPATION: Increased muscle tension in bilateral lumbar paraspinals and upper glutes, however patient denies TTP  LUMBAR ROM:   Active  03/28/23 - Eval  Flexion Hands to ankles - HS tightness  Extension 25% limited  Right lateral flexion Hand to fibular head ^  Left lateral flexion Hand to lateral calf  Right rotation 10% limited  Left rotation 40% limited  (Blank rows = not tested, ^ = increased pain)  LOWER EXTREMITY ROM:    Limited B hip extension and ER   LOWER EXTREMITY MMT:    MMT Right eval Left eval R 04/20/23 L 04/20/23  Hip flexion 5 4 5  4+  Hip extension 4 4+ 4+ 4+  Hip abduction 4 4 4+ 4+  Hip adduction 4+ 4+ 4+ 4+  Hip internal rotation 5 5 5 5    Hip external rotation 5 5 5 5   Knee flexion 5 5 5 5   Knee extension 5 5 5 5   Ankle dorsiflexion 5 4+ 5 5  Ankle plantarflexion 5 5 5 5   Ankle inversion      Ankle eversion       (Blank rows = not tested)  LUMBAR SPECIAL TESTS:  Straight leg raise test: Positive and Slump test: Positive - bilateral  GAIT: Distance walked: clinic distances Assistive device utilized: None Level of assistance: Complete Independence Gait pattern: step through pattern and trunk flexed   TODAY'S TREATMENT:   04/20/23  Re-Eval - post-hospitalization for acute CVA LE MMT unchanged or improved  Gait pattern unchanged with no LOB Review of acute care PT/OT/SLP assessment - no indication for f/u PT related to CVA, but OP OT and SLP recommended  THERAPEUTIC EXERCISE: to improve flexibility, strength and mobility.  Demonstration, verbal and tactile cues throughout for technique. Rec Bike - L2 x 6 min Standing TrA + GTB scap retraction + row 10 x 5" Standing TrA + GTB scap retraction + B shoulder extension 10 x 5" Quadruped over peanut ball: Alt hip extension 10 x 3" Alt UE raise 10 x 3" Prone press-up 10 x 3" Bird dog opp UE/LE raise 10 x 3" Quadruped cat/cow (w/o peanut ball) x 10   04/10/23  THERAPEUTIC EXERCISE: to improve flexibility, strength and mobility.  Demonstration, verbal and tactile cues throughout for technique. Rec Bike - L2 x 6 min POE x 1 min POE + alt fwd/OH reach x 10 Quadruped cat/cow x 10 Quad rocking from child's pose to prone press-up x 10  MANUAL THERAPY: To promote normalized muscle tension, improved flexibility, and reduced pain. Skilled palpation and monitoring of soft tissue during DN Trigger Point Dry-Needling  Treatment instructions: Expect mild to moderate muscle soreness. Patient verbalized understanding of these instructions and education. Patient Consent Given: Yes Education handout provided: Yes Muscles treated: B QL, lumbar erector spinae and  multifidi Electrical stimulation performed: No Parameters: N/A Treatment response/outcome: Twitch Response Elicited and Palpable Increase in Muscle Length STM/DTM, manual TPR and pin & stretch to muscles addressed with DN   04/04/23 Manual Therapy: to decrease muscle spasm, pain and improve mobility.  STM to bil thoraco-lumbar PS, glutes, QL and IASTM with foam roll  Therapeutic Exercise: to improve strength and mobility.  Demo, verbal and tactile cues throughout for technique.  SKTC stretch x 30" bil LTR 3x10" both ways Brief review of remainder of HEP Hooklying pelvic tils 10x3" Hooklying marches with core brace x 10 bil Bridges x 10 with TrA   03/28/23 - Eval  THERAPEUTIC EXERCISE: to improve flexibility, strength and mobility.  Demonstration, verbal and tactile cues throughout for technique. SKTC stretch 2 x 30" Hooklying B LTR 2 x 10" POE x 1 min Prone press up x 3 Demonstrated lumbar extension options in standing but not attempted   PATIENT EDUCATION:  Education details: HEP progression - quadruped cat/cow, continue with current HEP, postural awareness, options for OP OT & SLP at [facility], and need for D/C from OP PT if he were to start [facility] Person educated: Patient Education method: Explanation, Demonstration, and Handouts Education comprehension: verbalized understanding and needs further education  HOME EXERCISE PROGRAM: Access Code: [email or web address] URL: [email or web address] Date: 04/20/2023 Prepared by: Glenetta Hew  Exercises - Supine  Single Knee to Chest Stretch  - 2-3 x daily - 7 x weekly - 3 reps - 30 sec hold - Supine Lower Trunk Rotation  - 2-3 x daily - 7 x weekly - 5 reps - 10 sec hold - Static Prone on Elbows  - 2-3 x daily - 7 x weekly - 2 sets - 3 reps - 30 sec hold - Standing Lumbar Extension with Counter  - 2-3 x daily - 7 x weekly - 2 sets - 10 reps - 3 sec hold - Standing Lumbar Extension at Wall - Forearms  - 2-3 x  daily - 7 x weekly - 2 sets - 10 reps - 3 sec hold - Supine Posterior Pelvic Tilt  - 2-3 x daily - 7 x weekly - 2 sets - 10 reps - Supine March  - 1 x daily - 7 x weekly - 2-3 sets - 10 reps - Supine Bridge  - 1 x daily - 7 x weekly - 2-3 sets - 10 reps - Cat Cow  - 1 x daily - 7 x weekly - 2 sets - 10 reps - 5 sec hold   ASSESSMENT:  CLINICAL IMPRESSION: Sire returns to OP PT after 3-day hospitalization (04/12/23 - 04/15/23) for acute CVA - left MCA territory infarction per CTA of head along with with ICA severe stenosis as well as occlusion of the left vertebral artery. MRI also revealing multiple acute infarcts L MCA and PCA territories, along with old R frontal infarct.  Acute care PT eval revealing no indication for OP PT f/u related to CVA and reassessment today not demonstrating any new CVA-related deficits from a PT standpoint, however increased difficulty observed with speech and R UE weakness for which OP OT and SLP was recommended as part of acute care assessment.  As such will proceed with current OP PT POC addressing his ongoing LBP.  Ott reports benefit from lumbar extension exercises and reducing his low back pain, therefore proceeded with progression of extension focused exercises in standing and supported quadruped.  Cues necessary for improved postural awareness and scapular muscle activation during standing exercises.  Majority of quadruped exercises performed with UE weightbearing on elbows and torso supported on peanut ball, however he was able to complete quadruped cat/cow without torso support and requested instructions for this exercise at home.  He reports LBP reduced from 8/10 upon arrival to PT to 5/10 by end of PT session today.  Reynol will benefit from continued skilled PT to address ongoing LBP-related deficits to improve mobility and activity tolerance with decreased pain interference.   OBJECTIVE IMPAIRMENTS: Abnormal gait, decreased activity tolerance, decreased  endurance, decreased knowledge of condition, decreased mobility, difficulty walking, decreased ROM, decreased strength, hypomobility, increased fascial restrictions, impaired perceived functional ability, increased muscle spasms, impaired flexibility, impaired sensation, improper body mechanics, postural dysfunction, and pain.   ACTIVITY LIMITATIONS: carrying, lifting, bending, sitting, standing, squatting, sleeping, stairs, transfers, bathing, dressing, reach over head, locomotion level, and caring for others  PARTICIPATION LIMITATIONS: meal prep, cleaning, laundry, driving, shopping, community activity, occupation, and yard work  PERSONAL FACTORS: Fitness, Past/current experiences, Profession, Time since onset of injury/illness/exacerbation, and 3+ comorbidities: HTN; DM-II; CA s/p CABG x 4 - 2017; Peripheral neuropathic pain; Obesity  are also affecting patient's functional outcome.   REHAB POTENTIAL: Good  CLINICAL DECISION MAKING: Evolving/moderate complexity  EVALUATION COMPLEXITY: Moderate   GOALS: Goals reviewed with patient? Yes  SHORT TERM GOALS: Target date: 04/25/2023   Patient will be independent with  initial HEP to improve outcomes and carryover.  Baseline: Initial HEP provided on eval Goal status: IN PROGRESS  2.  Patient will report centralization of radicular symptoms.  Baseline: L>R LE radicular pain down to calf Goal status: IN PROGRESS  LONG TERM GOALS: Target date: 05/23/2023   Patient will be independent with ongoing/advanced HEP for self-management at home.  Baseline:  Goal status: IN PROGRESS  2.  Patient will report 50-75% improvement in low back pain and L LE radicular pain to improve QOL.  Baseline: 5/10, up to 9/10 with L>R LE radicular pain Goal status: IN PROGRESS  3.  Patient to demonstrate ability to achieve and maintain good spinal alignment/posturing and body mechanics needed for daily activities. Baseline: Flexed trunk posture with rounded  shoulders and forward head Goal status: IN PROGRESS  4.  Patient will demonstrate functional pain free lumbar ROM to perform ADLs.   Baseline: Refer to above lumbar ROM table Goal status: IN PROGRESS  5.  Patient will demonstrate improved B LE strength to >/= 4+/5 for improved stability and ease of mobility . Baseline: Refer to above MMT table Goal status: IN PROGRESS  6.  Patient will report 60 on lumbar FOTO to demonstrate improved functional ability.  Baseline: 52 Goal status: IN PROGRESS   7. Patient will report </= 24% on Modified Oswestry to demonstrate improved functional ability with decreased pain interference. Baseline: 18 / 50 = 36.0 %  Goal status: IN PROGRESS  8.  Patient will report ability to resume walking for wellness program at work without limitation due to LBP or L LE radiculopathy. Baseline: Patient has been unable to walk for wellness program due to pain Goal status: IN PROGRESS   PLAN:  PT FREQUENCY: 1-2x/week pending pt ability to afford copay  PT DURATION: 6-8 weeks  PLANNED INTERVENTIONS: Therapeutic exercises, Therapeutic activity, Neuromuscular re-education, Balance training, Gait training, Patient/Family education, Self Care, Joint mobilization, Stair training, Aquatic Therapy, Dry Needling, Electrical stimulation, Spinal manipulation, Spinal mobilization, Cryotherapy, Moist heat, Taping, Traction, Ultrasound, Ionotophoresis 4mg /ml Dexamethasone, Manual therapy, and Re-evaluation  PLAN FOR NEXT SESSION: STG assessment - review initial HEP, assess for change in LE radicular symptoms;  progress lumbopelvic flexibility and strengthening with extension preference;  MT as indicated and benefit noted (patient requesting no further DN)   Marry Guan, PT 04/20/2023, 5:33 PM

## 2023-04-21 ENCOUNTER — Other Ambulatory Visit: Payer: Self-pay | Admitting: Family Medicine

## 2023-04-21 NOTE — Addendum Note (Signed)
Addended by: Seabron Spates R on: 04/21/2023 10:14 AM   Modules accepted: Orders

## 2023-04-23 ENCOUNTER — Encounter: Payer: Self-pay | Admitting: Family Medicine

## 2023-04-24 ENCOUNTER — Ambulatory Visit: Payer: PRIVATE HEALTH INSURANCE | Admitting: Physical Therapy

## 2023-04-24 NOTE — Progress Notes (Signed)
Letter mailed out. Done 

## 2023-04-26 ENCOUNTER — Ambulatory Visit: Payer: PRIVATE HEALTH INSURANCE | Attending: Family Medicine | Admitting: Occupational Therapy

## 2023-04-26 ENCOUNTER — Ambulatory Visit: Payer: PRIVATE HEALTH INSURANCE

## 2023-04-26 DIAGNOSIS — R41842 Visuospatial deficit: Secondary | ICD-10-CM | POA: Diagnosis present

## 2023-04-26 DIAGNOSIS — M5416 Radiculopathy, lumbar region: Secondary | ICD-10-CM | POA: Insufficient documentation

## 2023-04-26 DIAGNOSIS — G8929 Other chronic pain: Secondary | ICD-10-CM | POA: Insufficient documentation

## 2023-04-26 DIAGNOSIS — M5459 Other low back pain: Secondary | ICD-10-CM | POA: Diagnosis present

## 2023-04-26 DIAGNOSIS — R471 Dysarthria and anarthria: Secondary | ICD-10-CM | POA: Diagnosis present

## 2023-04-26 DIAGNOSIS — R278 Other lack of coordination: Secondary | ICD-10-CM | POA: Insufficient documentation

## 2023-04-26 DIAGNOSIS — M62830 Muscle spasm of back: Secondary | ICD-10-CM

## 2023-04-26 DIAGNOSIS — I639 Cerebral infarction, unspecified: Secondary | ICD-10-CM | POA: Diagnosis not present

## 2023-04-26 DIAGNOSIS — M5442 Lumbago with sciatica, left side: Secondary | ICD-10-CM | POA: Diagnosis present

## 2023-04-26 DIAGNOSIS — R293 Abnormal posture: Secondary | ICD-10-CM | POA: Diagnosis present

## 2023-04-26 DIAGNOSIS — R41841 Cognitive communication deficit: Secondary | ICD-10-CM | POA: Diagnosis present

## 2023-04-26 DIAGNOSIS — R208 Other disturbances of skin sensation: Secondary | ICD-10-CM | POA: Diagnosis present

## 2023-04-26 DIAGNOSIS — R2689 Other abnormalities of gait and mobility: Secondary | ICD-10-CM | POA: Insufficient documentation

## 2023-04-26 DIAGNOSIS — M6281 Muscle weakness (generalized): Secondary | ICD-10-CM | POA: Diagnosis present

## 2023-04-26 DIAGNOSIS — R29898 Other symptoms and signs involving the musculoskeletal system: Secondary | ICD-10-CM | POA: Diagnosis not present

## 2023-04-26 NOTE — Therapy (Unsigned)
OUTPATIENT OCCUPATIONAL THERAPY NEURO EVALUATION  Patient Name: Dennis Hopkins MRN: [id number] DOB:February 12, 1961, 62 y.o., male Today's Date: 04/27/2023  PCP: Dr. Laury Axon REFERRING PROVIDER: Dr. Laury Axon  END OF SESSION:  OT End of Session - 04/26/23 1252     Visit Number 1    Number of Visits 13    Date for OT Re-Evaluation 07/19/23    Authorization Type Generic provider    Authorization - Number of Visits 12   24 remaining for PT/OT   OT Start Time 1020    OT Stop Time 1058    OT Time Calculation (min) 38 min    Activity Tolerance Patient tolerated treatment well    Behavior During Therapy [facility] for tasks assessed/performed             Past Medical History:  Diagnosis Date   Acute CVA (cerebrovascular accident) (HCC) 04/12/2023   Acute left-sided low back pain with right-sided sciatica 02/14/2019   Calcified granuloma of lung 09/06/2021   Chest pain 11/11/2015   Chickenpox    Chronic alcohol use 04/12/2023   Coronary artery disease 01/12/2016   Diabetes (HCC) 01/18/2016   Type 2     Diabetes mellitus (HCC) 04/13/2021   Eczema 04/12/2022   Essential hypertension    Exertional angina (HCC)    History of CAD (coronary artery disease) 04/12/2023   HTN (hypertension) 10/20/2015   Hyperlipemia    Hyperlipidemia    Hyperlipidemia LDL goal <70 10/20/2015   Hypokalemia 04/12/2023   Insulin dependent type 2 diabetes mellitus (HCC) 07/19/2016   Kidney stones 06/21/2011   Lumbar foraminal stenosis 01/05/2022   Morbid obesity (HCC) 04/12/2023   Obesity (BMI 30-39.9) 09/26/2013   Peripheral neuropathic pain 09/06/2021   Peripheral neuropathy 04/12/2023   Preventative health care 09/29/2014   Rash 08/24/2020   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   Snoring 08/24/2020   Tobacco use disorder 09/26/2013   Type 2 diabetes mellitus with diabetic peripheral angiopathy without gangrene, without long-term current use of insulin (HCC) 09/06/2021   Type 2 diabetes mellitus with hyperglycemia,  without long-term current use of insulin (HCC) 04/13/2021   Urinary frequency 08/24/2020   Past Surgical History:  Procedure Laterality Date   CARDIAC CATHETERIZATION N/A 01/07/2016   Procedure: Left Heart Cath and Coronary Angiography;  Surgeon: Corky Crafts, MD;  Location: [facility] INVASIVE CV LAB;  Service: Cardiovascular;  Laterality: N/A;   CORONARY ARTERY BYPASS GRAFT N/A 01/13/2016   Procedure: CORONARY ARTERY BYPASS GRAFTING (CABG) x4 Endoscopic Harvesting of the Right Greater Saphenous Vein;  Surgeon: Loreli Slot, MD;  Location: [facility] OR;  Service: Open Heart Surgery;  Laterality: N/A;   TEE WITHOUT CARDIOVERSION N/A 01/13/2016   Procedure: TRANSESOPHAGEAL ECHOCARDIOGRAM (TEE);  Surgeon: Loreli Slot, MD;  Location: [facility] OR;  Service: Open Heart Surgery;  Laterality: N/A;   TONSILLECTOMY     Patient Active Problem List   Diagnosis Date Noted   Hyperlipemia    Ischemic cerebrovascular accident (CVA) (HCC) 04/12/2023   History of CAD (coronary artery disease) 04/12/2023   Chronic alcohol use 04/12/2023   Hypokalemia 04/12/2023   Morbid obesity (HCC) 04/12/2023   Peripheral neuropathy 04/12/2023   Eczema 04/12/2022   Lumbar foraminal stenosis 01/05/2022   Type 2 diabetes mellitus with diabetic peripheral angiopathy without gangrene, without long-term current use of insulin (HCC) 09/06/2021   Calcified granuloma of lung 09/06/2021   Peripheral neuropathic pain 09/06/2021   Type 2 diabetes mellitus with hyperglycemia, without long-term current use of insulin (HCC)  04/13/2021   Diabetes mellitus (HCC) 04/13/2021   Essential hypertension    Hyperlipidemia    Chickenpox    Urinary frequency 08/24/2020   Snoring 08/24/2020   Rash 08/24/2020   Acute left-sided low back pain with right-sided sciatica 02/14/2019   Insulin dependent type 2 diabetes mellitus (HCC) 07/19/2016   Diabetes (HCC) 01/18/2016   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   Coronary artery disease 01/12/2016    Exertional angina (HCC)    Chest pain 11/11/2015   HTN (hypertension) 10/20/2015   Hyperlipidemia LDL goal <70 10/20/2015   Preventative health care 09/29/2014   Obesity (BMI 30-39.9) 09/26/2013   Tobacco use disorder 09/26/2013    ONSET DATE: 04/12/23  REFERRING DIAG:  Diagnosis  I63.9 (ICD-10-CM) - Ischemic cerebrovascular accident (CVA) (HCC)  R29.898 (ICD-10-CM) - Right hand weakness    THERAPY DIAG:  Muscle weakness (generalized) - Plan: Ot plan of care cert/re-cert  Other lack of coordination - Plan: Ot plan of care cert/re-cert  Visuospatial deficit - Plan: Ot plan of care cert/re-cert  Other abnormalities of gait and mobility - Plan: Ot plan of care cert/re-cert  Other disturbances of skin sensation - Plan: Ot plan of care cert/re-cert  Rationale for Evaluation and Treatment: Rehabilitation  SUBJECTIVE:   SUBJECTIVE STATEMENT: Pt reports he is having trouble using RUE Pt accompanied by: self  PERTINENT HISTORY: Per chart 62 y.o. male presented to emergency department 04/12/23 with complaining of right-sided weakness; CTA head which showed left MCA territory infarction along with ICA severe stenosis as well as occlusion of the left vertebral artery. MRI multiple acute infarcts L MCA and PCA territories, old R frontal infarct PMH significant of CAD status post CABG, hypertension, insulin-dependent DM type II, hyperlipidemia, aspirin allergy, chronic alcohol use, and obesity   PRECAUTIONS: Fall   PAIN:  Are you having pain? Yes: NPRS scale: 7/10 Pain location: back Pain description: aching Aggravating factors: standing Relieving factors: rest, heat , OT will not address directly as pt is seeing PT for this  FALLS: Has patient fallen in last 6 months? No  LIVING ENVIRONMENT: Lives with: lives alone Lives in: House/apartment Stairs: No Has following equipment at home: Environmental consultant - 4 wheeled, Wheelchair (manual), and shower chair  PLOF: Independent  PATIENT  GOALS: improve use of right hand   OBJECTIVE:  Note: Objective measures were completed at Evaluation unless otherwise noted.  HAND DOMINANCE: Right  ADLs: Overall ADLs: increased time required and difficulty using R dominant hand due to weakness and incoordination Transfers/ambulation related to ADLs: Eating: difficulty feeding self with RUE, and cutting food  Grooming: difficulty shaving pt cut himself UB Dressing: mod I LB Dressing: mod I Toileting: mod i Bathing: mod I  Tub Shower transfers: mod I Equipment: Shower seat with back  IADLs: Shopping: does light shopping  Meal Prep: using microwave  Community mobility: mod I Medication management: keeps  up with own meds Financial management: grandson helps Handwriting: Not legible  MOBILITY STATUS: Independent       UPPER EXTREMITY ROM:    Active ROM Right eval Left eval  Shoulder flexion 125   Shoulder abduction    Shoulder adduction    Shoulder extension    Shoulder internal rotation    Shoulder external rotation    Elbow flexion    Elbow extension -10   Wrist flexion 63   Wrist extension 40   Wrist ulnar deviation    Wrist radial deviation    Wrist pronation    Wrist supination    (  Blank rows = not tested)   HAND FUNCTION: Grip strength: Right: 40 lbs; Left: 75 lbs  COORDINATION: 9 Hole Peg test: Right: 1 min 45 sec; Left: 33.34 sec   SENSATION: Mild numbness in RUE  EDEMA: mild edema in RUE    COGNITION: Overall cognitive status:  to be further assessed in a functional context  VISION: Subjective report: Pt reports he got new glasses but they are not working as well. Baseline vision: Wears glasses for reading only   VISION ASSESSMENT: Visual Fields: per hospital notes right visual field deficits, per gross assessment no significant deficit noted will assess in functional context number cancellation: 2/44 missed Pt was cautioned to exercise caution and double check on the right side  when driving, as pt reports MD cleared him to drive      OBSERVATIONS: Pleasant male with speech difficulties is agreeable to OT treatment   TODAY'S TREATMENT:                                                                                                                              DATE: 04/26/23 -eval only  PATIENT EDUCATION: Education details: role of OT and potential OT goals Person educated: Patient Education method: Verbal cues Education comprehension: verbalized understanding  HOME EXERCISE PROGRAM: N/a   GOALS: Goals reviewed with patient? Yes  SHORT TERM GOALS: Target date: 05/25/23  I with initial HEP  Goal status: INITIAL  2.  Pt will demonstrate improved fine motor coordination as evidenced by decreasing 9 hole peg test score by 5 secs from initial. Baseline: 1 min 45 secs Goal status: INITIAL  3.  Pt will demonstrate understanding of edema control techniques and sensory precautions  Goal status: INITIAL  4.  Pt will demonstrate grossly 90% composite finger flexion with RUE for increased functional use. Baseline: 85% Goal status: INITIAL  5.  Pt will report consistently feeding himself with RUE.  Goal status: INITIAL  6.  I with with adaptive equipment and strategies to maximize pt's safety and I with ADLs/IADLS.  Goal status: INITIAL 7. Pt will perform environmental scanning with 90% or better accuracy. Goal status: initial  LONG TERM GOALS: Target date: 07/19/23  I with updated HEP. Baseline:  Goal status: INITIAL  2.  Pt will demonstrate improved fine motor coordination for ADLs as evidenced by decreasing 9 hole peg test score  to 90 secs Baseline: 1 min 45 secs Goal status: INITIAL  3.  Pt will increase RUE grip strength to 50 lbs for increased functional use. Baseline: 40 lbs Goal status: INITIAL  4.  Pt will resume use of RUE as dominant hand 50% of the time for ADLS/light IADLs.  Goal status: INITIAL  5.  Pt will demonstrate  ability to retrieve a 3 lbs object from overhead shelf without drops x 5 reps  Goal status: INITIAL  6.  Pt will demonstrate ability to carry an 8 lbs bag of groceries with RUE.  Goal status: INITIAL   ASSESSMENT:  CLINICAL IMPRESSION: Patient is a 62 y.o. male who was seen today for occupational therapy evaluation for CVA. PMH significant of CAD status post CABG, hypertension, insulin-dependent DM type II, hyperlipidemia, aspirin allergy, chronic alcohol use, and obesity . Pt presents with the following deficits:decreased strength, decreased coordination, decreased balance, decreased functional mobility,sensory deficits, back pain, visual perceptual deficits which impedes performance of ADLs/IADLS. Pt can benefit from skilled occupational therapy to address these deficits in order to maximize pt's safety and I with daily activities.    PERFORMANCE DEFICITS: in functional skills including ADLs, IADLs, coordination, dexterity, sensation, edema, ROM, strength, pain, flexibility, Fine motor control, Gross motor control, mobility, balance, endurance, decreased knowledge of precautions, decreased knowledge of use of DME, vision, and UE functional use, cognitive skills including attention, problem solving, safety awareness, thought, and understand, and psychosocial skills including coping strategies, environmental adaptation, habits, interpersonal interactions, and routines and behaviors.   IMPAIRMENTS: are limiting patient from ADLs, IADLs, rest and sleep, work, play, leisure, and social participation.   CO-MORBIDITIES: may have co-morbidities  that affects occupational performance. Patient will benefit from skilled OT to address above impairments and improve overall function.  MODIFICATION OR ASSISTANCE TO COMPLETE EVALUATION: No modification of tasks or assist necessary to complete an evaluation.  OT OCCUPATIONAL PROFILE AND HISTORY: Detailed assessment: Review of records and additional review  of physical, cognitive, psychosocial history related to current functional performance.  CLINICAL DECISION MAKING: LOW - limited treatment options, no task modification necessary  REHAB POTENTIAL: Good  EVALUATION COMPLEXITY: Low    PLAN:  OT FREQUENCY: 1x/week plus  OT DURATION: 12 weeks (however may wrap up dependent upon progress and insurance limitations)  PLANNED INTERVENTIONS: 16109 OT Re-evaluation, 97535 self care/ADL training, 60454 therapeutic exercise, 97530 therapeutic activity, 97112 neuromuscular re-education, 97140 manual therapy, 97113 aquatic therapy, 97035 ultrasound, 97018 paraffin, 09811 moist heat, 97010 cryotherapy, 97129 Cognitive training (first 15 min), 91478 Cognitive training(each additional 15 min), 29562 Splinting (initial encounter), passive range of motion, functional mobility training, visual/perceptual remediation/compensation, psychosocial skills training, energy conservation, coping strategies training, patient/family education, and DME and/or AE instructions  RECOMMENDED OTHER SERVICES: ST  CONSULTED AND AGREED WITH PLAN OF CARE: Patient  PLAN FOR NEXT SESSION: initial HEP for coordination and closed chain shoulder flexion   Shaya Altamura, OT 04/27/2023, 9:22 AM

## 2023-04-26 NOTE — Therapy (Signed)
OUTPATIENT PHYSICAL THERAPY RE-EVALUATION & TREATMENT   Patient Name: JEREMI LOSITO MRN: [id number] DOB:04-30-1961, 62 y.o., male Today's Date: 04/26/2023  END OF SESSION:  PT End of Session - 04/26/23 1052     Visit Number 5    Date for PT Re-Evaluation 05/23/23    Authorization Type [employer]    PT Start Time 1100    PT Stop Time 1145    PT Time Calculation (min) 45 min    Activity Tolerance Patient tolerated treatment well    Behavior During Therapy [facility] for tasks assessed/performed                  Past Medical History:  Diagnosis Date   Acute CVA (cerebrovascular accident) (HCC) 04/12/2023   Acute left-sided low back pain with right-sided sciatica 02/14/2019   Calcified granuloma of lung 09/06/2021   Chest pain 11/11/2015   Chickenpox    Chronic alcohol use 04/12/2023   Coronary artery disease 01/12/2016   Diabetes (HCC) 01/18/2016   Type 2     Diabetes mellitus (HCC) 04/13/2021   Eczema 04/12/2022   Essential hypertension    Exertional angina (HCC)    History of CAD (coronary artery disease) 04/12/2023   HTN (hypertension) 10/20/2015   Hyperlipemia    Hyperlipidemia    Hyperlipidemia LDL goal <70 10/20/2015   Hypokalemia 04/12/2023   Insulin dependent type 2 diabetes mellitus (HCC) 07/19/2016   Kidney stones 06/21/2011   Lumbar foraminal stenosis 01/05/2022   Morbid obesity (HCC) 04/12/2023   Obesity (BMI 30-39.9) 09/26/2013   Peripheral neuropathic pain 09/06/2021   Peripheral neuropathy 04/12/2023   Preventative health care 09/29/2014   Rash 08/24/2020   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   Snoring 08/24/2020   Tobacco use disorder 09/26/2013   Type 2 diabetes mellitus with diabetic peripheral angiopathy without gangrene, without long-term current use of insulin (HCC) 09/06/2021   Type 2 diabetes mellitus with hyperglycemia, without long-term current use of insulin (HCC) 04/13/2021   Urinary frequency 08/24/2020   Past Surgical History:  Procedure  Laterality Date   CARDIAC CATHETERIZATION N/A 01/07/2016   Procedure: Left Heart Cath and Coronary Angiography;  Surgeon: Corky Crafts, MD;  Location: [facility] INVASIVE CV LAB;  Service: Cardiovascular;  Laterality: N/A;   CORONARY ARTERY BYPASS GRAFT N/A 01/13/2016   Procedure: CORONARY ARTERY BYPASS GRAFTING (CABG) x4 Endoscopic Harvesting of the Right Greater Saphenous Vein;  Surgeon: Loreli Slot, MD;  Location: [facility] OR;  Service: Open Heart Surgery;  Laterality: N/A;   TEE WITHOUT CARDIOVERSION N/A 01/13/2016   Procedure: TRANSESOPHAGEAL ECHOCARDIOGRAM (TEE);  Surgeon: Loreli Slot, MD;  Location: [facility] OR;  Service: Open Heart Surgery;  Laterality: N/A;   TONSILLECTOMY     Patient Active Problem List   Diagnosis Date Noted   Hyperlipemia    Ischemic cerebrovascular accident (CVA) (HCC) 04/12/2023   History of CAD (coronary artery disease) 04/12/2023   Chronic alcohol use 04/12/2023   Hypokalemia 04/12/2023   Morbid obesity (HCC) 04/12/2023   Peripheral neuropathy 04/12/2023   Eczema 04/12/2022   Lumbar foraminal stenosis 01/05/2022   Type 2 diabetes mellitus with diabetic peripheral angiopathy without gangrene, without long-term current use of insulin (HCC) 09/06/2021   Calcified granuloma of lung 09/06/2021   Peripheral neuropathic pain 09/06/2021   Type 2 diabetes mellitus with hyperglycemia, without long-term current use of insulin (HCC) 04/13/2021   Diabetes mellitus (HCC) 04/13/2021   Essential hypertension    Hyperlipidemia    Chickenpox    Urinary  frequency 08/24/2020   Snoring 08/24/2020   Rash 08/24/2020   Acute left-sided low back pain with right-sided sciatica 02/14/2019   Insulin dependent type 2 diabetes mellitus (HCC) 07/19/2016   Diabetes (HCC) 01/18/2016   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   Coronary artery disease 01/12/2016   Exertional angina (HCC)    Chest pain 11/11/2015   HTN (hypertension) 10/20/2015   Hyperlipidemia LDL goal <70 10/20/2015    Preventative health care 09/29/2014   Obesity (BMI 30-39.9) 09/26/2013   Tobacco use disorder 09/26/2013    PCP: Donato Schultz, DO   REFERRING PROVIDER: Donato Schultz, DO   REFERRING DIAG: [phone] (ICD-10-CM) - Lumbar foraminal stenosis   THERAPY DIAG:  Muscle weakness (generalized)  Other low back pain  Radiculopathy, lumbar region  Abnormal posture  Chronic bilateral low back pain with left-sided sciatica  Muscle spasm of back  RATIONALE FOR EVALUATION AND TREATMENT: Rehabilitation  ONSET DATE: ~1 year   NEXT MD VISIT: PRN - none scheduled   SUBJECTIVE:                                                                                                                                                                                                         SUBJECTIVE STATEMENT: My back is still hurting. I had a stroke 3 days after I did DN, I don't want to do any more of that.    PAIN: Are you having pain? Yes: NPRS scale: 7/10 Pain location: B low back Pain description: nagging Aggravating factors: prolonged sitting (driving a forklift at work), lifting, walking Relieving factors: heat, Rx meds - muscle relaxant makes him drowsy  PERTINENT HISTORY:  HTN; DM-II; CA s/p CABG x 4 - 2017; Peripheral neuropathic pain; Obesity  PRECAUTIONS: None  RED FLAGS: None  WEIGHT BEARING RESTRICTIONS: No  FALLS:  Has patient fallen in last 6 months? 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    Alignment:  Facet mediated anterolisthesis of L5 on S1 of 3 mm.   Vertebrae: No fracture, suspicious marrow lesion, or significant marrow edema. Hemangioma in the L5 vertebral body. Small L5 superior  endplate Schmorl's node. Multilevel Modic type 2 endplate changes.   Conus medullaris and cauda equina: Conus extends to the L1 level. Conus and cauda equina appear normal.   Paraspinal and other soft tissues: Unremarkable.   Disc levels:   Mild diffuse lumbar disc desiccation. Moderate disc space narrowing at L3-4 and L4-5.   T12-L1: Mild disc bulging without stenosis.   L1-2: Disc bulging without stenosis.   L2-3: Disc bulging and mild facet hypertrophy without stenosis.   L3-4: Disc bulging results in borderline to mild bilateral lateral recess stenosis and mild-to-moderate bilateral neural foraminal stenosis without significant spinal stenosis.   L4-5: Disc bulging results in borderline to mild bilateral lateral recess stenosis and moderate bilateral neural foraminal stenosis without significant spinal stenosis.   L5-S1: Anterolisthesis with bulging uncovered disc and moderate right and severe left facet hypertrophy result in moderate bilateral neural foraminal stenosis without spinal stenosis.   IMPRESSION: 1. Multilevel lumbar disc and facet degeneration with moderate neural foraminal stenosis at L4-5 and L5-S1. 2. No significant spinal stenosis.  11/02/21 - Lumbar spine x-ray: FINDINGS: There is no evidence for lumbar spine fracture. Spinal alignment is normal. There is mild-to-moderate disc space narrowing at L3-L4 and L4-L5, mildly progressed. Mild disc space narrowing at L5-S1 appears stable. Endplate osteophytes are seen throughout. There is facet arthropathy at L5-S1. Soft tissues are within normal limits.   IMPRESSION: 1. No acute fracture or malalignment. 2. Moderate multilevel degenerative changes throughout the spine have mildly progressed compared to 2020.  PATIENT  SURVEYS:  Modified Oswestry 18 / 50 = 36.0 %  FOTO lumbar spine 52, predicted 60  SCREENING FOR RED FLAGS: Bowel or bladder incontinence: No Spinal tumors: No Cauda equina syndrome: No Compression fracture: No Abdominal aneurysm: No  COGNITION:  Overall cognitive status: Within functional limits for tasks assessed    SENSATION: Radicular pain down L>R LE to calves B diabetic peripheral neuropathy in B feet  MUSCLE LENGTH: Hamstrings: mod/severe tight L>R ITB: mod/severe tight L>R Piriformis: mod/severe tight L>R Hip flexors: mod tight B Quads: mod tight B Heelcord: NT  POSTURE:  rounded shoulders, forward head, decreased lumbar lordosis, and flexed trunk   PALPATION: Increased muscle tension in bilateral lumbar paraspinals and upper glutes, however patient denies TTP  LUMBAR ROM:   Active  03/28/23 - Eval  Flexion Hands to ankles - HS tightness  Extension 25% limited  Right lateral flexion Hand to fibular head ^  Left lateral flexion Hand to lateral calf  Right rotation 10% limited  Left rotation 40% limited  (Blank rows = not tested, ^ = increased pain)  LOWER EXTREMITY ROM:    Limited B hip extension and ER   LOWER EXTREMITY MMT:    MMT Right eval Left eval R 04/20/23 L 04/20/23  Hip flexion 5 4 5  4+  Hip extension 4 4+ 4+ 4+  Hip abduction 4 4 4+ 4+  Hip adduction 4+ 4+ 4+ 4+  Hip internal rotation 5 5 5 5   Hip external rotation 5 5 5 5   Knee flexion 5 5 5 5   Knee extension 5 5 5 5   Ankle dorsiflexion 5 4+ 5 5  Ankle plantarflexion 5 5 5 5   Ankle inversion      Ankle eversion       (Blank rows = not tested)  LUMBAR SPECIAL TESTS:  Straight leg raise test: Positive and Slump test: Positive - bilateral  GAIT: Distance walked: clinic distances Assistive device utilized: None Level of assistance: Complete Independence  Gait pattern: step through pattern and trunk flexed   TODAY'S TREATMENT:  04/26/23 NuStep L5x36mins  STS 2x10 Supine stretching  HS, single knee to chest  Feet on ball rotations, small bridges, knees to chest x10 blackTB extension 2x10 Seated row 25# 2x10 Shoulder extension 5# x10, 10# x10    04/20/23  Re-Eval - post-hospitalization for acute CVA LE MMT unchanged or improved  Gait pattern unchanged with no LOB Review of acute care PT/OT/SLP assessment - no indication for f/u PT related to CVA, but OP OT and SLP recommended  THERAPEUTIC EXERCISE: to improve flexibility, strength and mobility.  Demonstration, verbal and tactile cues throughout for technique. Rec Bike - L2 x 6 min Standing TrA + GTB scap retraction + row 10 x 5" Standing TrA + GTB scap retraction + B shoulder extension 10 x 5" Quadruped over peanut ball: Alt hip extension 10 x 3" Alt UE raise 10 x 3" Prone press-up 10 x 3" Bird dog opp UE/LE raise 10 x 3" Quadruped cat/cow (w/o peanut ball) x 10   04/10/23  THERAPEUTIC EXERCISE: to improve flexibility, strength and mobility.  Demonstration, verbal and tactile cues throughout for technique. Rec Bike - L2 x 6 min POE x 1 min POE + alt fwd/OH reach x 10 Quadruped cat/cow x 10 Quad rocking from child's pose to prone press-up x 10  MANUAL THERAPY: To promote normalized muscle tension, improved flexibility, and reduced pain. Skilled palpation and monitoring of soft tissue during DN Trigger Point Dry-Needling  Treatment instructions: Expect mild to moderate muscle soreness. Patient verbalized understanding of these instructions and education. Patient Consent Given: Yes Education handout provided: Yes Muscles treated: B QL, lumbar erector spinae and multifidi Electrical stimulation performed: No Parameters: N/A Treatment response/outcome: Twitch Response Elicited and Palpable Increase in Muscle Length STM/DTM, manual TPR and pin & stretch to muscles addressed with DN   04/04/23 Manual Therapy: to decrease muscle spasm, pain and improve mobility.  STM to bil thoraco-lumbar PS, glutes, QL  and IASTM with foam roll  Therapeutic Exercise: to improve strength and mobility.  Demo, verbal and tactile cues throughout for technique.  SKTC stretch x 30" bil LTR 3x10" both ways Brief review of remainder of HEP Hooklying pelvic tils 10x3" Hooklying marches with core brace x 10 bil Bridges x 10 with TrA   03/28/23 - Eval  THERAPEUTIC EXERCISE: to improve flexibility, strength and mobility.  Demonstration, verbal and tactile cues throughout for technique. SKTC stretch 2 x 30" Hooklying B LTR 2 x 10" POE x 1 min Prone press up x 3 Demonstrated lumbar extension options in standing but not attempted   PATIENT EDUCATION:  Education details: HEP progression - quadruped cat/cow, continue with current HEP, postural awareness, options for OP OT & SLP at [facility], and need for D/C from OP PT if he were to start [facility] services Person educated: Patient Education method: Explanation, Demonstration, and Handouts Education comprehension: verbalized understanding and needs further education  HOME EXERCISE PROGRAM: Access Code: [email or web address] URL: [email or web address] Date: 04/20/2023 Prepared by: Glenetta Hew  Exercises - Supine Single Knee to Chest Stretch  - 2-3 x daily - 7 x weekly - 3 reps - 30 sec hold - Supine Lower Trunk Rotation  - 2-3 x daily - 7 x weekly - 5 reps - 10 sec hold - Static Prone on Elbows  - 2-3 x daily - 7 x weekly - 2 sets - 3 reps - 30 sec hold - Standing Lumbar Extension  with Counter  - 2-3 x daily - 7 x weekly - 2 sets - 10 reps - 3 sec hold - Standing Lumbar Extension at Wall - Forearms  - 2-3 x daily - 7 x weekly - 2 sets - 10 reps - 3 sec hold - Supine Posterior Pelvic Tilt  - 2-3 x daily - 7 x weekly - 2 sets - 10 reps - Supine March  - 1 x daily - 7 x weekly - 2-3 sets - 10 reps - Supine Bridge  - 1 x daily - 7 x weekly - 2-3 sets - 10 reps - Cat Cow  - 1 x daily - 7 x weekly - 2 sets - 10 reps - 5 sec  hold   ASSESSMENT:  CLINICAL IMPRESSION: Kamal reports ongoing LBP even after his stroke. The stroke affected his R arm and stroke more so than LE's. Continued to work on some mobility and strengthening exercises as tolerated. Cues necessary for improved postural awareness. His images show some disc protrusion at multiple lumbar levels, therefore he will benefit from lumbar extension exercises which he reports have been helping. States back feels a little better and more limber at end of session. Also reports that e-stim and heat were helpful in previous visits. Tymar will benefit from continued skilled PT to address ongoing LBP-related deficits to improve mobility and activity tolerance with decreased pain interference.   OBJECTIVE IMPAIRMENTS: Abnormal gait, decreased activity tolerance, decreased endurance, decreased knowledge of condition, decreased mobility, difficulty walking, decreased ROM, decreased strength, hypomobility, increased fascial restrictions, impaired perceived functional ability, increased muscle spasms, impaired flexibility, impaired sensation, improper body mechanics, postural dysfunction, and pain.   ACTIVITY LIMITATIONS: carrying, lifting, bending, sitting, standing, squatting, sleeping, stairs, transfers, bathing, dressing, reach over head, locomotion level, and caring for others  PARTICIPATION LIMITATIONS: meal prep, cleaning, laundry, driving, shopping, community activity, occupation, and yard work  PERSONAL FACTORS: Fitness, Past/current experiences, Profession, Time since onset of injury/illness/exacerbation, and 3+ comorbidities: HTN; DM-II; CA s/p CABG x 4 - 2017; Peripheral neuropathic pain; Obesity  are also affecting patient's functional outcome.   REHAB POTENTIAL: Good  CLINICAL DECISION MAKING: Evolving/moderate complexity  EVALUATION COMPLEXITY: Moderate   GOALS: Goals reviewed with patient? Yes  SHORT TERM GOALS: Target date: 04/25/2023   Patient  will be independent with initial HEP to improve outcomes and carryover.  Baseline: Initial HEP provided on eval Goal status: IN PROGRESS  2.  Patient will report centralization of radicular symptoms.  Baseline: L>R LE radicular pain down to calf Goal status: IN PROGRESS  LONG TERM GOALS: Target date: 05/23/2023   Patient will be independent with ongoing/advanced HEP for self-management at home.  Baseline:  Goal status: IN PROGRESS  2.  Patient will report 50-75% improvement in low back pain and L LE radicular pain to improve QOL.  Baseline: 5/10, up to 9/10 with L>R LE radicular pain Goal status: IN PROGRESS  3.  Patient to demonstrate ability to achieve and maintain good spinal alignment/posturing and body mechanics needed for daily activities. Baseline: Flexed trunk posture with rounded shoulders and forward head Goal status: IN PROGRESS  4.  Patient will demonstrate functional pain free lumbar ROM to perform ADLs.   Baseline: Refer to above lumbar ROM table Goal status: IN PROGRESS  5.  Patient will demonstrate improved B LE strength to >/= 4+/5 for improved stability and ease of mobility . Baseline: Refer to above MMT table Goal status: IN PROGRESS  6.  Patient will report 16  on lumbar FOTO to demonstrate improved functional ability.  Baseline: 52 Goal status: IN PROGRESS   7. Patient will report </= 24% on Modified Oswestry to demonstrate improved functional ability with decreased pain interference. Baseline: 18 / 50 = 36.0 %  Goal status: IN PROGRESS  8.  Patient will report ability to resume walking for wellness program at work without limitation due to LBP or L LE radiculopathy. Baseline: Patient has been unable to walk for wellness program due to pain Goal status: IN PROGRESS   PLAN:  PT FREQUENCY: 1-2x/week pending pt ability to afford copay  PT DURATION: 6-8 weeks  PLANNED INTERVENTIONS: Therapeutic exercises, Therapeutic activity, Neuromuscular  re-education, Balance training, Gait training, Patient/Family education, Self Care, Joint mobilization, Stair training, Aquatic Therapy, Dry Needling, Electrical stimulation, Spinal manipulation, Spinal mobilization, Cryotherapy, Moist heat, Taping, Traction, Ultrasound, Ionotophoresis 4mg /ml Dexamethasone, Manual therapy, and Re-evaluation  PLAN FOR NEXT SESSION: progress lumbopelvic flexibility and strengthening with extension preference, leg machines, MH and e-stim for pain  Cassie Freer, PT 04/26/2023, 11:40 AM

## 2023-04-27 ENCOUNTER — Ambulatory Visit: Payer: PRIVATE HEALTH INSURANCE | Admitting: Physical Therapy

## 2023-05-02 ENCOUNTER — Ambulatory Visit: Payer: PRIVATE HEALTH INSURANCE | Admitting: Speech Pathology

## 2023-05-02 ENCOUNTER — Telehealth: Payer: Self-pay | Admitting: Family Medicine

## 2023-05-02 ENCOUNTER — Encounter: Payer: Self-pay | Admitting: Speech Pathology

## 2023-05-02 ENCOUNTER — Ambulatory Visit: Payer: PRIVATE HEALTH INSURANCE | Admitting: Occupational Therapy

## 2023-05-02 DIAGNOSIS — R2689 Other abnormalities of gait and mobility: Secondary | ICD-10-CM

## 2023-05-02 DIAGNOSIS — M6281 Muscle weakness (generalized): Secondary | ICD-10-CM | POA: Diagnosis not present

## 2023-05-02 DIAGNOSIS — R278 Other lack of coordination: Secondary | ICD-10-CM

## 2023-05-02 DIAGNOSIS — R41841 Cognitive communication deficit: Secondary | ICD-10-CM

## 2023-05-02 DIAGNOSIS — R41842 Visuospatial deficit: Secondary | ICD-10-CM

## 2023-05-02 DIAGNOSIS — R208 Other disturbances of skin sensation: Secondary | ICD-10-CM

## 2023-05-02 DIAGNOSIS — R471 Dysarthria and anarthria: Secondary | ICD-10-CM

## 2023-05-02 DIAGNOSIS — R293 Abnormal posture: Secondary | ICD-10-CM

## 2023-05-02 NOTE — Therapy (Signed)
OUTPATIENT OCCUPATIONAL THERAPY NEURO EVALUATION  Patient Name: Dennis Hopkins MRN: [id number] DOB:06-24-1960, 62 y.o., male Today's Date: 05/02/2023  PCP: Dr. Laury Axon REFERRING PROVIDER: Dr. Laury Axon  END OF SESSION:  OT End of Session - 05/02/23 1128     Visit Number 2    Number of Visits 13    Date for OT Re-Evaluation 07/19/23    Authorization Type Generic provider    Authorization - Number of Visits 12    OT Start Time 1100    OT Stop Time 1144    OT Time Calculation (min) 44 min    Activity Tolerance Patient tolerated treatment well    Behavior During Therapy [facility] for tasks assessed/performed              Past Medical History:  Diagnosis Date   Acute CVA (cerebrovascular accident) (HCC) 04/12/2023   Acute left-sided low back pain with right-sided sciatica 02/14/2019   Calcified granuloma of lung 09/06/2021   Chest pain 11/11/2015   Chickenpox    Chronic alcohol use 04/12/2023   Coronary artery disease 01/12/2016   Diabetes (HCC) 01/18/2016   Type 2     Diabetes mellitus (HCC) 04/13/2021   Eczema 04/12/2022   Essential hypertension    Exertional angina (HCC)    History of CAD (coronary artery disease) 04/12/2023   HTN (hypertension) 10/20/2015   Hyperlipemia    Hyperlipidemia    Hyperlipidemia LDL goal <70 10/20/2015   Hypokalemia 04/12/2023   Insulin dependent type 2 diabetes mellitus (HCC) 07/19/2016   Kidney stones 06/21/2011   Lumbar foraminal stenosis 01/05/2022   Morbid obesity (HCC) 04/12/2023   Obesity (BMI 30-39.9) 09/26/2013   Peripheral neuropathic pain 09/06/2021   Peripheral neuropathy 04/12/2023   Preventative health care 09/29/2014   Rash 08/24/2020   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   Snoring 08/24/2020   Tobacco use disorder 09/26/2013   Type 2 diabetes mellitus with diabetic peripheral angiopathy without gangrene, without long-term current use of insulin (HCC) 09/06/2021   Type 2 diabetes mellitus with hyperglycemia, without long-term current  use of insulin (HCC) 04/13/2021   Urinary frequency 08/24/2020   Past Surgical History:  Procedure Laterality Date   CARDIAC CATHETERIZATION N/A 01/07/2016   Procedure: Left Heart Cath and Coronary Angiography;  Surgeon: Corky Crafts, MD;  Location: [facility] INVASIVE CV LAB;  Service: Cardiovascular;  Laterality: N/A;   CORONARY ARTERY BYPASS GRAFT N/A 01/13/2016   Procedure: CORONARY ARTERY BYPASS GRAFTING (CABG) x4 Endoscopic Harvesting of the Right Greater Saphenous Vein;  Surgeon: Loreli Slot, MD;  Location: [facility] OR;  Service: Open Heart Surgery;  Laterality: N/A;   TEE WITHOUT CARDIOVERSION N/A 01/13/2016   Procedure: TRANSESOPHAGEAL ECHOCARDIOGRAM (TEE);  Surgeon: Loreli Slot, MD;  Location: [facility] OR;  Service: Open Heart Surgery;  Laterality: N/A;   TONSILLECTOMY     Patient Active Problem List   Diagnosis Date Noted   Hyperlipemia    Ischemic cerebrovascular accident (CVA) (HCC) 04/12/2023   History of CAD (coronary artery disease) 04/12/2023   Chronic alcohol use 04/12/2023   Hypokalemia 04/12/2023   Morbid obesity (HCC) 04/12/2023   Peripheral neuropathy 04/12/2023   Eczema 04/12/2022   Lumbar foraminal stenosis 01/05/2022   Type 2 diabetes mellitus with diabetic peripheral angiopathy without gangrene, without long-term current use of insulin (HCC) 09/06/2021   Calcified granuloma of lung 09/06/2021   Peripheral neuropathic pain 09/06/2021   Type 2 diabetes mellitus with hyperglycemia, without long-term current use of insulin (HCC) 04/13/2021   Diabetes  mellitus (HCC) 04/13/2021   Essential hypertension    Hyperlipidemia    Chickenpox    Urinary frequency 08/24/2020   Snoring 08/24/2020   Rash 08/24/2020   Acute left-sided low back pain with right-sided sciatica 02/14/2019   Insulin dependent type 2 diabetes mellitus (HCC) 07/19/2016   Diabetes (HCC) 01/18/2016   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   Coronary artery disease 01/12/2016   Exertional angina (HCC)     Chest pain 11/11/2015   HTN (hypertension) 10/20/2015   Hyperlipidemia LDL goal <70 10/20/2015   Preventative health care 09/29/2014   Obesity (BMI 30-39.9) 09/26/2013   Tobacco use disorder 09/26/2013    ONSET DATE: 04/12/23  REFERRING DIAG:  Diagnosis  I63.9 (ICD-10-CM) - Ischemic cerebrovascular accident (CVA) (HCC)  R29.898 (ICD-10-CM) - Right hand weakness    THERAPY DIAG:  Muscle weakness (generalized)  Abnormal posture  Other lack of coordination  Visuospatial deficit  Other abnormalities of gait and mobility  Other disturbances of skin sensation  Rationale for Evaluation and Treatment: Rehabilitation  SUBJECTIVE:   SUBJECTIVE STATEMENT: Pt reports mild back pain Pt accompanied by: self  PERTINENT HISTORY: Per chart 62 y.o. male presented to emergency department 04/12/23 with complaining of right-sided weakness; CTA head which showed left MCA territory infarction along with ICA severe stenosis as well as occlusion of the left vertebral artery. MRI multiple acute infarcts L MCA and PCA territories, old R frontal infarct PMH significant of CAD status post CABG, hypertension, insulin-dependent DM type II, hyperlipidemia, aspirin allergy, chronic alcohol use, and obesity   PRECAUTIONS: Fall   PAIN:  Are you having pain? Yes: NPRS scale: 5/10 Pain location: back Pain description: aching Aggravating factors: standing Relieving factors: rest, heat , OT will not address directly as pt is seeing PT for this  FALLS: Has patient fallen in last 6 months? No  LIVING ENVIRONMENT: Lives with: lives alone Lives in: House/apartment Stairs: No Has following equipment at home: Environmental consultant - 4 wheeled, Wheelchair (manual), and shower chair  PLOF: Independent  PATIENT GOALS: improve use of right hand   OBJECTIVE:  Note: Objective measures were completed at Evaluation unless otherwise noted.  HAND DOMINANCE: Right  ADLs: Overall ADLs: increased time required and  difficulty using R dominant hand due to weakness and incoordination Transfers/ambulation related to ADLs: Eating: difficulty feeding self with RUE, and cutting food  Grooming: difficulty shaving pt cut himself UB Dressing: mod I LB Dressing: mod I Toileting: mod i Bathing: mod I  Tub Shower transfers: mod I Equipment: Shower seat with back  IADLs: Shopping: does light shopping  Meal Prep: using microwave  Community mobility: mod I Medication management: keeps  up with own meds Financial management: grandson helps Handwriting: Not legible  MOBILITY STATUS: Independent       UPPER EXTREMITY ROM:    Active ROM Right eval Left eval  Shoulder flexion 125   Shoulder abduction    Shoulder adduction    Shoulder extension    Shoulder internal rotation    Shoulder external rotation    Elbow flexion    Elbow extension -10   Wrist flexion 63   Wrist extension 40   Wrist ulnar deviation    Wrist radial deviation    Wrist pronation    Wrist supination    (Blank rows = not tested)   HAND FUNCTION: Grip strength: Right: 40 lbs; Left: 75 lbs  COORDINATION: 9 Hole Peg test: Right: 1 min 45 sec; Left: 33.34 sec   SENSATION: Mild numbness  in RUE  EDEMA: mild edema in RUE    COGNITION: Overall cognitive status:  to be further assessed in a functional context  VISION: Subjective report: Pt reports he got new glasses but they are not working as well. Baseline vision: Wears glasses for reading only   VISION ASSESSMENT: Visual Fields: per hospital notes right visual field deficits, per gross assessment no significant deficit noted will assess in functional context number cancellation: 2/44 missed Pt was cautioned to exercise caution and double check on the right side when driving, as pt reports MD cleared him to drive      OBSERVATIONS: Pleasant male with speech difficulties is agreeable to OT treatment   TODAY'S TREATMENT:                                                                                                                               DATE: 05/02/23 Pt was instructed in initial HEP for cane exercises in sitting and exercises for wrist, forearm and hand and beginning coordination, min v.c and demonstration. Fit of prefab resting splint was assessed however since pt now has increased function in hand, therapist recommends pt does not wear splint so he can use hand. Arm bike x 6 mins level 1 for conditioning 04/26/23 -eval only  PATIENT EDUCATION: Education details: initial HEP  Person educated: Patient Education method: Verbal cues, demonstration, handout Education comprehension: verbalized understanding, returned demonstration  HOME EXERCISE PROGRAM: Cane exercises, beginning, coordination 05/02/23   GOALS: Goals reviewed with patient? Yes  SHORT TERM GOALS: Target date: 05/25/23  I with initial HEP  Goal status: I issued needs reinforcement 05/02/23  2.  Pt will demonstrate improved fine motor coordination as evidenced by decreasing 9 hole peg test score by 5 secs from initial. Baseline: 1 min 45 secs Goal status: INITIAL  3.  Pt will demonstrate understanding of edema control techniques and sensory precautions  Goal status: INITIAL  4.  Pt will demonstrate grossly 90% composite finger flexion with RUE for increased functional use. Baseline: 85% Goal status: INITIAL  5.  Pt will report consistently feeding himself with RUE.  Goal status: INITIAL  6.  I with with adaptive equipment and strategies to maximize pt's safety and I with ADLs/IADLS.  Goal status: INITIAL 7. Pt will perform environmental scanning with 90% or better accuracy. Goal status: initial  LONG TERM GOALS: Target date: 07/19/23  I with updated HEP. Baseline:  Goal status: INITIAL  2.  Pt will demonstrate improved fine motor coordination for ADLs as evidenced by decreasing 9 hole peg test score  to 90 secs Baseline: 1 min 45 secs Goal status:  INITIAL  3.  Pt will increase RUE grip strength to 50 lbs for increased functional use. Baseline: 40 lbs Goal status: INITIAL  4.  Pt will resume use of RUE as dominant hand 50% of the time for ADLS/light IADLs.  Goal status: INITIAL  5.  Pt will demonstrate ability to  retrieve a 3 lbs object from overhead shelf without drops x 5 reps  Goal status: INITIAL  6.  Pt will demonstrate ability to carry an 8 lbs bag of groceries with RUE.  Goal status: INITIAL   ASSESSMENT:  CLINICAL IMPRESSION: Pt is progressing towards goals for increased ROM and functional use of RUE.    PERFORMANCE DEFICITS: in functional skills including ADLs, IADLs, coordination, dexterity, sensation, edema, ROM, strength, pain, flexibility, Fine motor control, Gross motor control, mobility, balance, endurance, decreased knowledge of precautions, decreased knowledge of use of DME, vision, and UE functional use, cognitive skills including attention, problem solving, safety awareness, thought, and understand, and psychosocial skills including coping strategies, environmental adaptation, habits, interpersonal interactions, and routines and behaviors.   IMPAIRMENTS: are limiting patient from ADLs, IADLs, rest and sleep, work, play, leisure, and social participation.   CO-MORBIDITIES: may have co-morbidities  that affects occupational performance. Patient will benefit from skilled OT to address above impairments and improve overall function.  MODIFICATION OR ASSISTANCE TO COMPLETE EVALUATION: No modification of tasks or assist necessary to complete an evaluation.  OT OCCUPATIONAL PROFILE AND HISTORY: Detailed assessment: Review of records and additional review of physical, cognitive, psychosocial history related to current functional performance.  CLINICAL DECISION MAKING: LOW - limited treatment options, no task modification necessary  REHAB POTENTIAL: Good  EVALUATION COMPLEXITY: Low    PLAN:  OT FREQUENCY:  1x/week plus  OT DURATION: 12 weeks (however may wrap up dependent upon progress and insurance limitations)  PLANNED INTERVENTIONS: 78469 OT Re-evaluation, 97535 self care/ADL training, 62952 therapeutic exercise, 97530 therapeutic activity, 97112 neuromuscular re-education, 97140 manual therapy, 97113 aquatic therapy, 97035 ultrasound, 97018 paraffin, 84132 moist heat, 97010 cryotherapy, 97129 Cognitive training (first 15 min), 44010 Cognitive training(each additional 15 min), 27253 Splinting (initial encounter), passive range of motion, functional mobility training, visual/perceptual remediation/compensation, psychosocial skills training, energy conservation, coping strategies training, patient/family education, and DME and/or AE instructions  RECOMMENDED OTHER SERVICES: ST  CONSULTED AND AGREED WITH PLAN OF CARE: Patient  PLAN FOR NEXT SESSION:  review initial HEP for coordination and closed chain shoulder flexion, functional use of RUE   Glori Machnik, OT 05/02/2023, 12:14 PM

## 2023-05-02 NOTE — Patient Instructions (Addendum)
SHOULDER: Flexion - Sitting    Hold cane with both hands. Raise arms up. Keep elbows straight.  __10_ reps per set, __1_ sets per day, __7_ days per week  Copyright  VHI. All rights reserved.  SCAPULA: Retraction    Hold cane with both hands. Pinch shoulder blades together. Do not shrug shoulders.10___ reps per set, _1__ sets per day, __7_ days per week  Copyright  VHI. All rights reserved.   ELBOW: Flexion (Cane)    Hold cane with both hands. Bend and straighten elbows. Hold __5_ seconds. 10___ reps per set, __1_ sets per day, __7_ days per week  Copyright  VHI. All rights reserved.   Cane Majorette - Sitting    Holding cane majorette style down across trunk, raise toward ceiling. Repeat _10__ times. Reverse hand placement, repeat to other side. Do _1__ times per day.  Copyright  VHI. All rights reserved.    AROM: Wrist Extension   .  With __right__ palm down, bend wrist up. Repeat __10__ times per set.  Do _1-2 sessions per day.       AROM: Forearm Pronation / Supination   With _right___ arm in handshake position, slowly rotate palm down until stretch is felt. Relax. Then rotate palm up until stretch is felt. Repeat _15___ times per set. Do _1-2_ sessions per day.  Opposition (Active)   Touch tip of thumb to nail tip of each finger in turn, making an "O" shape. Repeat __10__ times. Do _2__ sessions per day.    Copyright  VHI. All rights reserved.  Flexor Tendon Gliding (Active Hook Fist)   With fingers and knuckles straight, bend middle and tip joints. Do not bend large knuckles. Repeat _10-15___ times. Do _2__ sessions per day.  MP Flexion (Active)   With back of hand on table, bend large knuckles as far as they will go, keeping small joints straight. Repeat _10-15___ times. Do __4-6__ sessions per day. Activity: Reach into a narrow container.*      Finger Flexion / Extension   With palm up, bend fingers of left hand toward palm,  making a  fist. Straighten fingers, opening fist. Repeat sequence _10-15___ times per session. Do _2__ sessions per day. Hand Variation: Palm down   Copyright  VHI. All rights reserved.       Practice flipping cards Practice stacking pennies with right hand then pick up the stack in your hand and place in a container

## 2023-05-02 NOTE — Telephone Encounter (Signed)
Pt came into the office to check the status of his paperwork. Located paperwork in [address]. Pt would like a call when we send in the paperwork.

## 2023-05-02 NOTE — Therapy (Unsigned)
OUTPATIENT SPEECH LANGUAGE PATHOLOGY EVALUATION   Patient Name: Dennis Hopkins MRN: [id number] DOB:June 11, 1961, 62 y.o., male Today's Date: 05/03/2023  PCP: Donato Schultz, DO REFERRING PROVIDER: Donato Schultz, DO  END OF SESSION:  End of Session - 05/02/23 1023     Visit Number 1    Number of Visits 17    Date for SLP Re-Evaluation 07/03/23    SLP Start Time 1015    SLP Stop Time  1100    SLP Time Calculation (min) 45 min    Activity Tolerance Patient tolerated treatment well             Past Medical History:  Diagnosis Date   Acute CVA (cerebrovascular accident) (HCC) 04/12/2023   Acute left-sided low back pain with right-sided sciatica 02/14/2019   Calcified granuloma of lung 09/06/2021   Chest pain 11/11/2015   Chickenpox    Chronic alcohol use 04/12/2023   Coronary artery disease 01/12/2016   Diabetes (HCC) 01/18/2016   Type 2     Diabetes mellitus (HCC) 04/13/2021   Eczema 04/12/2022   Essential hypertension    Exertional angina (HCC)    History of CAD (coronary artery disease) 04/12/2023   HTN (hypertension) 10/20/2015   Hyperlipemia    Hyperlipidemia    Hyperlipidemia LDL goal <70 10/20/2015   Hypokalemia 04/12/2023   Insulin dependent type 2 diabetes mellitus (HCC) 07/19/2016   Kidney stones 06/21/2011   Lumbar foraminal stenosis 01/05/2022   Morbid obesity (HCC) 04/12/2023   Obesity (BMI 30-39.9) 09/26/2013   Peripheral neuropathic pain 09/06/2021   Peripheral neuropathy 04/12/2023   Preventative health care 09/29/2014   Rash 08/24/2020   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   Snoring 08/24/2020   Tobacco use disorder 09/26/2013   Type 2 diabetes mellitus with diabetic peripheral angiopathy without gangrene, without long-term current use of insulin (HCC) 09/06/2021   Type 2 diabetes mellitus with hyperglycemia, without long-term current use of insulin (HCC) 04/13/2021   Urinary frequency 08/24/2020   Past Surgical History:  Procedure  Laterality Date   CARDIAC CATHETERIZATION N/A 01/07/2016   Procedure: Left Heart Cath and Coronary Angiography;  Surgeon: Corky Crafts, MD;  Location: [facility] INVASIVE CV LAB;  Service: Cardiovascular;  Laterality: N/A;   CORONARY ARTERY BYPASS GRAFT N/A 01/13/2016   Procedure: CORONARY ARTERY BYPASS GRAFTING (CABG) x4 Endoscopic Harvesting of the Right Greater Saphenous Vein;  Surgeon: Loreli Slot, MD;  Location: [facility];  Service: Open Heart Surgery;  Laterality: N/A;   TEE WITHOUT CARDIOVERSION N/A 01/13/2016   Procedure: TRANSESOPHAGEAL ECHOCARDIOGRAM (TEE);  Surgeon: Loreli Slot, MD;  Location: [facility] OR;  Service: Open Heart Surgery;  Laterality: N/A;   TONSILLECTOMY     Patient Active Problem List   Diagnosis Date Noted   Hyperlipemia    Ischemic cerebrovascular accident (CVA) (HCC) 04/12/2023   History of CAD (coronary artery disease) 04/12/2023   Chronic alcohol use 04/12/2023   Hypokalemia 04/12/2023   Morbid obesity (HCC) 04/12/2023   Peripheral neuropathy 04/12/2023   Eczema 04/12/2022   Lumbar foraminal stenosis 01/05/2022   Type 2 diabetes mellitus with diabetic peripheral angiopathy without gangrene, without long-term current use of insulin (HCC) 09/06/2021   Calcified granuloma of lung 09/06/2021   Peripheral neuropathic pain 09/06/2021   Type 2 diabetes mellitus with hyperglycemia, without long-term current use of insulin (HCC) 04/13/2021   Diabetes mellitus (HCC) 04/13/2021   Essential hypertension    Hyperlipidemia    Chickenpox    Urinary frequency  08/24/2020   Snoring 08/24/2020   Rash 08/24/2020   Acute left-sided low back pain with right-sided sciatica 02/14/2019   Insulin dependent type 2 diabetes mellitus (HCC) 07/19/2016   Diabetes (HCC) 01/18/2016   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  Pt accompanied by: self  PERTINENT HISTORY: Per chart review: medical history significant of CAD status post CABG, hypertension, insulin-dependent DM type II, hyperlipidemia, aspirin allergy, chronic alcohol use   PAIN:  Are you having pain? Yes: NPRS scale: 6/10 Pain location: lower back Pain description: throbbing  Aggravating factors: sitting Relieving factors: medicine   FALLS: Has patient fallen in last 6 months?  No  LIVING ENVIRONMENT: Lives with: lives alone Lives in: House/apartment  PLOF:  Level of assistance: Independent with ADLs, Independent with IADLs Employment: On disability; Warehouse (driving fork lift, processing paperwork *looking for errors*)   PATIENT GOALS: speech   OBJECTIVE:  Note: Objective measures were completed at Evaluation unless otherwise noted.  DIAGNOSTIC FINDINGS:   Per chart review: IMPRESSION: 1. Multiple acute infarcts within the left hemisphere, affecting the MCA and PCA territories. 2. Petechial hemorrhage at these acute infarct sites. 3. Old right frontal infarct and findings of chronic small vessel ischemia.     Electronically Signed   By: Deatra Robinson M.D.   On: 04/13/2023 01:21  COGNITION: Overall cognitive status: Impaired Areas of impairment:  Memory: Impaired: Short term Executive function: Impaired: Organization, Planning, Error awareness, and Self-correction Functional deficits: reports difficulty managing bills and keeping up with due dates  MOTOR SPEECH: Overall motor speech:  impaired Level of impairment: Word, Phrase, Sentence, and Conversation Respiration: thoracic breathing Phonation:  slightly strangled Resonance: WFL Articulation: Impaired: word, phrase, sentence, and conversation Intelligibility: Intelligibility reduced Motor planning: Appears intact Motor speech errors:  NA Interfering components:  NA Effective technique: slow rate, increased vocal intensity, over articulate, and pause  ORAL MOTOR EXAMINATION: Overall status: Impaired:   Labial: Right (Symmetry and Strength) Lingual: Right (Strength) Facial: Right (Symmetry and Strength) Velum: Coordination Cough: WFL Comments: **VELUM IS WFL * typing error   STANDARDIZED ASSESSMENTS: SLUMS: 19  PATIENT REPORTED OUTCOME MEASURES (PROM): To complete next session   TODAY'S TREATMENT:  DATE:    PATIENT EDUCATION: Education details: Cognition, dysarthria, SLP role Person educated: Patient Education method: Explanation Education comprehension: needs further education   GOALS: Goals reviewed with patient? Yes  SHORT TERM GOALS: Target date: 06/03/23  Pt will recall 3 strategies to improve intelligibility with minA verbal cues. Baseline: Goal status: INITIAL  2.  Pt will recall 3 strategies to improve memory of important information. Baseline:  Goal status: INITIAL  3.  Pt will recall 2+ organization strategies to assist with managing at home tasks. Baseline:  Goal status: INITIAL    LONG TERM GOALS: Target date: 07/03/22  Pt will demonstrate intelligible speech by utilizing learned compensatory strategies in unstructured conversation.  Baseline:  Goal status: INITIAL  2.  Pt will improve score on PROMs.  Baseline:  Goal status: INITIAL  3.  Pt will report improved management of bills/home tasks.  Baseline:  Goal status:  INITIAL    ASSESSMENT:  CLINICAL IMPRESSION: Pt is a 62  yo male  who presents to [facility] for evaluation post CVA. Pt endorses speech difficulty and no awareness of cognitive changes. Due to patient not having a cognitive screen and his report of difficulty managing bills, SLP completed screen of thinking skills via SLUMs. See above. Pt reports a friend, Junious Dresser, assists with medicines and bringing food occasionally. He reported his wife used to do the bills, but has recently passed away 2022/12/15). When prompted, pt reports he is experiencing some depression, but no anxiety. Pt assessed oral motor function and observed moderate dysarthria of speech in conversation. Pt was ~70% intelligible in a familiar context and quiet environment. Pt was given an articulatory cue for initial /th/ which significantly increased intelligibility for the single word. Speech was characterized by re: rapid rate, significantly decreased articulatory precision. Pt reported his tongue feels like it is "in the way". SLP rec skilled ST services to address dysarthria and cognitive-communication impairment. To assess medication management next session as pt reports he is responsible for managing 15 pills.     OBJECTIVE IMPAIRMENTS: include memory and dysarthria. These impairments are limiting patient from managing medications, managing appointments, managing finances, and effectively communicating at home and in community. Factors affecting potential to achieve goals and functional outcome are  NA . Patient will benefit from skilled SLP services to address above impairments and improve overall function.  REHAB POTENTIAL: Good  PLAN:  SLP FREQUENCY: 1-2x/week  SLP DURATION: 8 weeks  PLANNED INTERVENTIONS: Environmental controls, Cueing hierachy, Internal/external aids, Functional tasks, SLP instruction and feedback, Compensatory strategies, and Patient/family education    [address], CCC-SLP 05/03/2023, 1:17 PM

## 2023-05-03 ENCOUNTER — Ambulatory Visit: Payer: PRIVATE HEALTH INSURANCE | Admitting: Physical Therapy

## 2023-05-03 NOTE — Telephone Encounter (Signed)
Forms completed and faxed. Pt made aware. Copies placed at the front for pickup

## 2023-05-08 NOTE — Therapy (Signed)
OUTPATIENT PHYSICAL THERAPY RE-EVALUATION & TREATMENT   Patient Name: KDYN LECKER MRN: [id number] DOB:04-12-1961, 62 y.o., male Today's Date: 05/09/2023  END OF SESSION:  PT End of Session - 05/09/23 0924     Visit Number 6    Date for PT Re-Evaluation 05/23/23    Authorization Type [employer]    PT Start Time 0930    PT Stop Time 1015    PT Time Calculation (min) 45 min    Activity Tolerance Patient tolerated treatment well    Behavior During Therapy [facility] for tasks assessed/performed                   Past Medical History:  Diagnosis Date   Acute CVA (cerebrovascular accident) (HCC) 04/12/2023   Acute left-sided low back pain with right-sided sciatica 02/14/2019   Calcified granuloma of lung 09/06/2021   Chest pain 11/11/2015   Chickenpox    Chronic alcohol use 04/12/2023   Coronary artery disease 01/12/2016   Diabetes (HCC) 01/18/2016   Type 2     Diabetes mellitus (HCC) 04/13/2021   Eczema 04/12/2022   Essential hypertension    Exertional angina (HCC)    History of CAD (coronary artery disease) 04/12/2023   HTN (hypertension) 10/20/2015   Hyperlipemia    Hyperlipidemia    Hyperlipidemia LDL goal <70 10/20/2015   Hypokalemia 04/12/2023   Insulin dependent type 2 diabetes mellitus (HCC) 07/19/2016   Kidney stones 06/21/2011   Lumbar foraminal stenosis 01/05/2022   Morbid obesity (HCC) 04/12/2023   Obesity (BMI 30-39.9) 09/26/2013   Peripheral neuropathic pain 09/06/2021   Peripheral neuropathy 04/12/2023   Preventative health care 09/29/2014   Rash 08/24/2020   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   Snoring 08/24/2020   Tobacco use disorder 09/26/2013   Type 2 diabetes mellitus with diabetic peripheral angiopathy without gangrene, without long-term current use of insulin (HCC) 09/06/2021   Type 2 diabetes mellitus with hyperglycemia, without long-term current use of insulin (HCC) 04/13/2021   Urinary frequency 08/24/2020   Past Surgical History:  Procedure  Laterality Date   CARDIAC CATHETERIZATION N/A 01/07/2016   Procedure: Left Heart Cath and Coronary Angiography;  Surgeon: Corky Crafts, MD;  Location: [facility] INVASIVE CV LAB;  Service: Cardiovascular;  Laterality: N/A;   CORONARY ARTERY BYPASS GRAFT N/A 01/13/2016   Procedure: CORONARY ARTERY BYPASS GRAFTING (CABG) x4 Endoscopic Harvesting of the Right Greater Saphenous Vein;  Surgeon: Loreli Slot, MD;  Location: [facility] OR;  Service: Open Heart Surgery;  Laterality: N/A;   TEE WITHOUT CARDIOVERSION N/A 01/13/2016   Procedure: TRANSESOPHAGEAL ECHOCARDIOGRAM (TEE);  Surgeon: Loreli Slot, MD;  Location: [facility] OR;  Service: Open Heart Surgery;  Laterality: N/A;   TONSILLECTOMY     Patient Active Problem List   Diagnosis Date Noted   Hyperlipemia    Ischemic cerebrovascular accident (CVA) (HCC) 04/12/2023   History of CAD (coronary artery disease) 04/12/2023   Chronic alcohol use 04/12/2023   Hypokalemia 04/12/2023   Morbid obesity (HCC) 04/12/2023   Peripheral neuropathy 04/12/2023   Eczema 04/12/2022   Lumbar foraminal stenosis 01/05/2022   Type 2 diabetes mellitus with diabetic peripheral angiopathy without gangrene, without long-term current use of insulin (HCC) 09/06/2021   Calcified granuloma of lung 09/06/2021   Peripheral neuropathic pain 09/06/2021   Type 2 diabetes mellitus with hyperglycemia, without long-term current use of insulin (HCC) 04/13/2021   Diabetes mellitus (HCC) 04/13/2021   Essential hypertension    Hyperlipidemia    Chickenpox  Urinary frequency 08/24/2020   Snoring 08/24/2020   Rash 08/24/2020   Acute left-sided low back pain with right-sided sciatica 02/14/2019   Insulin dependent type 2 diabetes mellitus (HCC) 07/19/2016   Diabetes (HCC) 01/18/2016   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   Coronary artery disease 01/12/2016   Exertional angina (HCC)    Chest pain 11/11/2015   HTN (hypertension) 10/20/2015   Hyperlipidemia LDL goal <70 10/20/2015    Preventative health care 09/29/2014   Obesity (BMI 30-39.9) 09/26/2013   Tobacco use disorder 09/26/2013    PCP: Donato Schultz, DO   REFERRING PROVIDER: Donato Schultz, DO   REFERRING DIAG: [id number] (ICD-10-CM) - Lumbar foraminal stenosis   THERAPY DIAG:  Abnormal posture  Muscle weakness (generalized)  Other lack of coordination  Other low back pain  Chronic bilateral low back pain with left-sided sciatica  Radiculopathy, lumbar region  RATIONALE FOR EVALUATION AND TREATMENT: Rehabilitation  ONSET DATE: ~1 year   NEXT MD VISIT: PRN - none scheduled   SUBJECTIVE:                                                                                                                                                                                                         SUBJECTIVE STATEMENT: The pain is not that bad this morning, it has not kicked in yet.    PAIN: Are you having pain? Yes: NPRS scale: 6/10 Pain location: B low back Pain description: nagging Aggravating factors: prolonged sitting (driving a forklift at work), lifting, walking Relieving factors: heat, Rx meds - muscle relaxant makes him drowsy  PERTINENT HISTORY:  HTN; DM-II; CA s/p CABG x 4 - 2017; Peripheral neuropathic pain; Obesity  PRECAUTIONS: None  RED FLAGS: None  WEIGHT BEARING RESTRICTIONS: No  FALLS:  Has patient fallen in last 6 months? 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   Alignment:  Facet mediated  anterolisthesis of L5 on S1 of 3 mm.   Vertebrae: No fracture, suspicious marrow lesion, or significant marrow edema. Hemangioma in the L5 vertebral body. Small L5 superior endplate Schmorl's node. Multilevel Modic type 2 endplate changes.  Conus medullaris and cauda equina: Conus extends to the L1 level. Conus and cauda equina appear normal.   Paraspinal and other soft tissues: Unremarkable.   Disc levels:   Mild diffuse lumbar disc desiccation. Moderate disc space narrowing at L3-4 and L4-5.   T12-L1: Mild disc bulging without stenosis.   L1-2: Disc bulging without stenosis.   L2-3: Disc bulging and mild facet hypertrophy without stenosis.   L3-4: Disc bulging results in borderline to mild bilateral lateral recess stenosis and mild-to-moderate bilateral neural foraminal stenosis without significant spinal stenosis.   L4-5: Disc bulging results in borderline to mild bilateral lateral recess stenosis and moderate bilateral neural foraminal stenosis without significant spinal stenosis.   L5-S1: Anterolisthesis with bulging uncovered disc and moderate right and severe left facet hypertrophy result in moderate bilateral neural foraminal stenosis without spinal stenosis.   IMPRESSION: 1. Multilevel lumbar disc and facet degeneration with moderate neural foraminal stenosis at L4-5 and L5-S1. 2. No significant spinal stenosis.  11/02/21 - Lumbar spine x-ray: FINDINGS: There is no evidence for lumbar spine fracture. Spinal alignment is normal. There is mild-to-moderate disc space narrowing at L3-L4 and L4-L5, mildly progressed. Mild disc space narrowing at L5-S1 appears stable. Endplate osteophytes are seen throughout. There is facet arthropathy at L5-S1. Soft tissues are within normal limits.   IMPRESSION: 1. No acute fracture or malalignment. 2. Moderate multilevel degenerative changes throughout the spine have mildly progressed compared to 2020.  PATIENT SURVEYS:  Modified Oswestry  18 / 50 = 36.0 %  FOTO lumbar spine 52, predicted 60  SCREENING FOR RED FLAGS: Bowel or bladder incontinence: No Spinal tumors: No Cauda equina syndrome: No Compression fracture: No Abdominal aneurysm: No  COGNITION:  Overall cognitive status: Within functional limits for tasks assessed    SENSATION: Radicular pain down L>R LE to calves B diabetic peripheral neuropathy in B feet  MUSCLE LENGTH: Hamstrings: mod/severe tight L>R ITB: mod/severe tight L>R Piriformis: mod/severe tight L>R Hip flexors: mod tight B Quads: mod tight B Heelcord: NT  POSTURE:  rounded shoulders, forward head, decreased lumbar lordosis, and flexed trunk   PALPATION: Increased muscle tension in bilateral lumbar paraspinals and upper glutes, however patient denies TTP  LUMBAR ROM:   Active  03/28/23 - Eval  Flexion Hands to ankles - HS tightness  Extension 25% limited  Right lateral flexion Hand to fibular head ^  Left lateral flexion Hand to lateral calf  Right rotation 10% limited  Left rotation 40% limited  (Blank rows = not tested, ^ = increased pain)  LOWER EXTREMITY ROM:    Limited B hip extension and ER   LOWER EXTREMITY MMT:    MMT Right eval Left eval R 04/20/23 L 04/20/23  Hip flexion 5 4 5  4+  Hip extension 4 4+ 4+ 4+  Hip abduction 4 4 4+ 4+  Hip adduction 4+ 4+ 4+ 4+  Hip internal rotation 5 5 5 5   Hip external rotation 5 5 5 5   Knee flexion 5 5 5 5   Knee extension 5 5 5 5   Ankle dorsiflexion 5 4+ 5 5  Ankle plantarflexion 5 5 5 5   Ankle inversion      Ankle eversion       (Blank rows = not tested)  LUMBAR SPECIAL TESTS:  Straight leg raise test: Positive and Slump test: Positive - bilateral  GAIT: Distance walked: clinic distances Assistive device utilized: None Level of assistance: Complete Independence Gait pattern: step through pattern and trunk flexed   TODAY'S  TREATMENT:  05/09/23 Bike L3 x55mins  Seated row 35# 2x10 Lat pull down 35# 2x10 Shoulder ext  10# 2x10 Leg ext 10# 2x10 HS curls 25# 2x10 Leg press 20# 2x10 STS with OHP 2x10 Supine passive stretching HS, SKTC, DKTC, trunk rotations, figure 4     04/26/23 NuStep L5x44mins  STS 2x10 Supine stretching HS, single knee to chest  Feet on ball rotations, small bridges, knees to chest x10 blackTB extension 2x10 Seated row 25# 2x10 Shoulder extension 5# x10, 10# x10    04/20/23  Re-Eval - post-hospitalization for acute CVA LE MMT unchanged or improved  Gait pattern unchanged with no LOB Review of acute care PT/OT/SLP assessment - no indication for f/u PT related to CVA, but OP OT and SLP recommended  THERAPEUTIC EXERCISE: to improve flexibility, strength and mobility.  Demonstration, verbal and tactile cues throughout for technique. Rec Bike - L2 x 6 min Standing TrA + GTB scap retraction + row 10 x 5" Standing TrA + GTB scap retraction + B shoulder extension 10 x 5" Quadruped over peanut ball: Alt hip extension 10 x 3" Alt UE raise 10 x 3" Prone press-up 10 x 3" Bird dog opp UE/LE raise 10 x 3" Quadruped cat/cow (w/o peanut ball) x 10   04/10/23  THERAPEUTIC EXERCISE: to improve flexibility, strength and mobility.  Demonstration, verbal and tactile cues throughout for technique. Rec Bike - L2 x 6 min POE x 1 min POE + alt fwd/OH reach x 10 Quadruped cat/cow x 10 Quad rocking from child's pose to prone press-up x 10  MANUAL THERAPY: To promote normalized muscle tension, improved flexibility, and reduced pain. Skilled palpation and monitoring of soft tissue during DN Trigger Point Dry-Needling  Treatment instructions: Expect mild to moderate muscle soreness. Patient verbalized understanding of these instructions and education. Patient Consent Given: Yes Education handout provided: Yes Muscles treated: B QL, lumbar erector spinae and multifidi Electrical stimulation performed: No Parameters: N/A Treatment response/outcome: Twitch Response Elicited and Palpable  Increase in Muscle Length STM/DTM, manual TPR and pin & stretch to muscles addressed with DN   04/04/23 Manual Therapy: to decrease muscle spasm, pain and improve mobility.  STM to bil thoraco-lumbar PS, glutes, QL and IASTM with foam roll  Therapeutic Exercise: to improve strength and mobility.  Demo, verbal and tactile cues throughout for technique.  SKTC stretch x 30" bil LTR 3x10" both ways Brief review of remainder of HEP Hooklying pelvic tils 10x3" Hooklying marches with core brace x 10 bil Bridges x 10 with TrA   03/28/23 - Eval  THERAPEUTIC EXERCISE: to improve flexibility, strength and mobility.  Demonstration, verbal and tactile cues throughout for technique. SKTC stretch 2 x 30" Hooklying B LTR 2 x 10" POE x 1 min Prone press up x 3 Demonstrated lumbar extension options in standing but not attempted   PATIENT EDUCATION:  Education details: HEP progression - quadruped cat/cow, continue with current HEP, postural awareness, options for OP OT & SLP at [facility], and need for D/C from OP PT if he were to start [facility] services Person educated: Patient Education method: Explanation, Demonstration, and Handouts Education comprehension: verbalized understanding and needs further education  HOME EXERCISE PROGRAM: Access Code: [email or web address] URL: [email or web address] Date: 04/20/2023 Prepared by: Glenetta Hew  Exercises - Supine Single Knee to Chest Stretch  - 2-3 x daily - 7 x weekly - 3 reps - 30 sec hold - Supine Lower Trunk Rotation  - 2-3 x daily - 7  x weekly - 5 reps - 10 sec hold - Static Prone on Elbows  - 2-3 x daily - 7 x weekly - 2 sets - 3 reps - 30 sec hold - Standing Lumbar Extension with Counter  - 2-3 x daily - 7 x weekly - 2 sets - 10 reps - 3 sec hold - Standing Lumbar Extension at Wall - Forearms  - 2-3 x daily - 7 x weekly - 2 sets - 10 reps - 3 sec hold - Supine Posterior Pelvic Tilt  - 2-3 x daily - 7 x weekly - 2 sets - 10  reps - Supine March  - 1 x daily - 7 x weekly - 2-3 sets - 10 reps - Supine Bridge  - 1 x daily - 7 x weekly - 2-3 sets - 10 reps - Cat Cow  - 1 x daily - 7 x weekly - 2 sets - 10 reps - 5 sec hold   ASSESSMENT:  CLINICAL IMPRESSION: [place] reports ongoing LBP that usually starts in the mornings. Continued to work on strengthening exercises as tolerated for low back pain including machine level interventions. Cues necessary for improved postural awareness. His images show some disc protrusion at multiple lumbar levels, therefore he will benefit from more extension based exercises which he reports have been helping. States back feels better after stretching. Also reports that e-stim and heat have been helpful in previous visits. [place] will benefit from continued skilled PT to address ongoing LBP-related deficits to improve mobility and activity tolerance with decreased pain interference.   OBJECTIVE IMPAIRMENTS: Abnormal gait, decreased activity tolerance, decreased endurance, decreased knowledge of condition, decreased mobility, difficulty walking, decreased ROM, decreased strength, hypomobility, increased fascial restrictions, impaired perceived functional ability, increased muscle spasms, impaired flexibility, impaired sensation, improper body mechanics, postural dysfunction, and pain.   ACTIVITY LIMITATIONS: carrying, lifting, bending, sitting, standing, squatting, sleeping, stairs, transfers, bathing, dressing, reach over head, locomotion level, and caring for others  PARTICIPATION LIMITATIONS: meal prep, cleaning, laundry, driving, shopping, community activity, occupation, and yard work  PERSONAL FACTORS: Fitness, Past/current experiences, Profession, Time since onset of injury/illness/exacerbation, and 3+ comorbidities: HTN; DM-II; CA s/p CABG x 4 - 2017; Peripheral neuropathic pain; Obesity  are also affecting patient's functional outcome.   REHAB POTENTIAL: Good  CLINICAL DECISION  MAKING: Evolving/moderate complexity  EVALUATION COMPLEXITY: Moderate   GOALS: Goals reviewed with patient? Yes  SHORT TERM GOALS: Target date: 04/25/2023   Patient will be independent with initial HEP to improve outcomes and carryover.  Baseline: Initial HEP provided on eval Goal status: IN PROGRESS  2.  Patient will report centralization of radicular symptoms.  Baseline: L>R LE radicular pain down to calf Goal status: IN PROGRESS  LONG TERM GOALS: Target date: 05/23/2023   Patient will be independent with ongoing/advanced HEP for self-management at home.  Baseline:  Goal status: IN PROGRESS  2.  Patient will report 50-75% improvement in low back pain and L LE radicular pain to improve QOL.  Baseline: 5/10, up to 9/10 with L>R LE radicular pain Goal status: IN PROGRESS  3.  Patient to demonstrate ability to achieve and maintain good spinal alignment/posturing and body mechanics needed for daily activities. Baseline: Flexed trunk posture with rounded shoulders and forward head Goal status: IN PROGRESS  4.  Patient will demonstrate functional pain free lumbar ROM to perform ADLs.   Baseline: Refer to above lumbar ROM table Goal status: IN PROGRESS  5.  Patient will demonstrate improved B LE strength to >/=  4+/5 for improved stability and ease of mobility . Baseline: Refer to above MMT table Goal status: IN PROGRESS  6.  Patient will report 60 on lumbar FOTO to demonstrate improved functional ability.  Baseline: 52 Goal status: IN PROGRESS   7. Patient will report </= 24% on Modified Oswestry to demonstrate improved functional ability with decreased pain interference. Baseline: 18 / 50 = 36.0 %  Goal status: IN PROGRESS  8.  Patient will report ability to resume walking for wellness program at work without limitation due to LBP or L LE radiculopathy. Baseline: Patient has been unable to walk for wellness program due to pain Goal status: IN PROGRESS   PLAN:  PT  FREQUENCY: 1-2x/week pending pt ability to afford copay  PT DURATION: 6-8 weeks  PLANNED INTERVENTIONS: Therapeutic exercises, Therapeutic activity, Neuromuscular re-education, Balance training, Gait training, Patient/Family education, Self Care, Joint mobilization, Stair training, Aquatic Therapy, Dry Needling, Electrical stimulation, Spinal manipulation, Spinal mobilization, Cryotherapy, Moist heat, Taping, Traction, Ultrasound, Ionotophoresis 4mg /ml Dexamethasone, Manual therapy, and Re-evaluation  PLAN FOR NEXT SESSION: progress lumbopelvic flexibility and strengthening with extension preference, leg machines, MH and e-stim for pain  Cassie Freer, PT 05/09/2023, 10:11 AM

## 2023-05-09 ENCOUNTER — Ambulatory Visit: Payer: PRIVATE HEALTH INSURANCE

## 2023-05-09 ENCOUNTER — Ambulatory Visit: Payer: PRIVATE HEALTH INSURANCE | Admitting: Speech Pathology

## 2023-05-09 ENCOUNTER — Ambulatory Visit: Payer: PRIVATE HEALTH INSURANCE | Admitting: Occupational Therapy

## 2023-05-09 ENCOUNTER — Encounter: Payer: Self-pay | Admitting: Speech Pathology

## 2023-05-09 DIAGNOSIS — M5459 Other low back pain: Secondary | ICD-10-CM

## 2023-05-09 DIAGNOSIS — R278 Other lack of coordination: Secondary | ICD-10-CM

## 2023-05-09 DIAGNOSIS — R293 Abnormal posture: Secondary | ICD-10-CM

## 2023-05-09 DIAGNOSIS — R41841 Cognitive communication deficit: Secondary | ICD-10-CM

## 2023-05-09 DIAGNOSIS — M6281 Muscle weakness (generalized): Secondary | ICD-10-CM

## 2023-05-09 DIAGNOSIS — G8929 Other chronic pain: Secondary | ICD-10-CM

## 2023-05-09 DIAGNOSIS — R471 Dysarthria and anarthria: Secondary | ICD-10-CM

## 2023-05-09 DIAGNOSIS — M5416 Radiculopathy, lumbar region: Secondary | ICD-10-CM

## 2023-05-09 DIAGNOSIS — R41842 Visuospatial deficit: Secondary | ICD-10-CM

## 2023-05-09 DIAGNOSIS — R2689 Other abnormalities of gait and mobility: Secondary | ICD-10-CM

## 2023-05-09 NOTE — Therapy (Signed)
OUTPATIENT OCCUPATIONAL THERAPY NEURO EVALUATION  Patient Name: Dennis Hopkins MRN: [id number] DOB:1961-04-25, 62 y.o., male Today's Date: 05/09/2023  PCP: Dr. Laury Axon REFERRING PROVIDER: Dr. Laury Axon  END OF SESSION:  OT End of Session - 05/09/23 1218     Visit Number 3    Number of Visits 13    Authorization - Number of Visits 12    OT Start Time 1107    OT Stop Time 1145    OT Time Calculation (min) 38 min    Activity Tolerance Patient tolerated treatment well    Behavior During Therapy WFL for tasks assessed/performed               Past Medical History:  Diagnosis Date   Acute CVA (cerebrovascular accident) (HCC) 04/12/2023   Acute left-sided low back pain with right-sided sciatica 02/14/2019   Calcified granuloma of lung 09/06/2021   Chest pain 11/11/2015   Chickenpox    Chronic alcohol use 04/12/2023   Coronary artery disease 01/12/2016   Diabetes (HCC) 01/18/2016   Type 2     Diabetes mellitus (HCC) 04/13/2021   Eczema 04/12/2022   Essential hypertension    Exertional angina (HCC)    History of CAD (coronary artery disease) 04/12/2023   HTN (hypertension) 10/20/2015   Hyperlipemia    Hyperlipidemia    Hyperlipidemia LDL goal <70 10/20/2015   Hypokalemia 04/12/2023   Insulin dependent type 2 diabetes mellitus (HCC) 07/19/2016   Kidney stones 06/21/2011   Lumbar foraminal stenosis 01/05/2022   Morbid obesity (HCC) 04/12/2023   Obesity (BMI 30-39.9) 09/26/2013   Peripheral neuropathic pain 09/06/2021   Peripheral neuropathy 04/12/2023   Preventative health care 09/29/2014   Rash 08/24/2020   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   Snoring 08/24/2020   Tobacco use disorder 09/26/2013   Type 2 diabetes mellitus with diabetic peripheral angiopathy without gangrene, without long-term current use of insulin (HCC) 09/06/2021   Type 2 diabetes mellitus with hyperglycemia, without long-term current use of insulin (HCC) 04/13/2021   Urinary frequency 08/24/2020   Past  Surgical History:  Procedure Laterality Date   CARDIAC CATHETERIZATION N/A 01/07/2016   Procedure: Left Heart Cath and Coronary Angiography;  Surgeon: Corky Crafts, MD;  Location: [facility] INVASIVE CV LAB;  Service: Cardiovascular;  Laterality: N/A;   CORONARY ARTERY BYPASS GRAFT N/A 01/13/2016   Procedure: CORONARY ARTERY BYPASS GRAFTING (CABG) x4 Endoscopic Harvesting of the Right Greater Saphenous Vein;  Surgeon: Loreli Slot, MD;  Location: [facility] OR;  Service: Open Heart Surgery;  Laterality: N/A;   TEE WITHOUT CARDIOVERSION N/A 01/13/2016   Procedure: TRANSESOPHAGEAL ECHOCARDIOGRAM (TEE);  Surgeon: Loreli Slot, MD;  Location: [facility] OR;  Service: Open Heart Surgery;  Laterality: N/A;   TONSILLECTOMY     Patient Active Problem List   Diagnosis Date Noted   Hyperlipemia    Ischemic cerebrovascular accident (CVA) (HCC) 04/12/2023   History of CAD (coronary artery disease) 04/12/2023   Chronic alcohol use 04/12/2023   Hypokalemia 04/12/2023   Morbid obesity (HCC) 04/12/2023   Peripheral neuropathy 04/12/2023   Eczema 04/12/2022   Lumbar foraminal stenosis 01/05/2022   Type 2 diabetes mellitus with diabetic peripheral angiopathy without gangrene, without long-term current use of insulin (HCC) 09/06/2021   Calcified granuloma of lung 09/06/2021   Peripheral neuropathic pain 09/06/2021   Type 2 diabetes mellitus with hyperglycemia, without long-term current use of insulin (HCC) 04/13/2021   Diabetes mellitus (HCC) 04/13/2021   Essential hypertension    Hyperlipidemia  Chickenpox    Urinary frequency 08/24/2020   Snoring 08/24/2020   Rash 08/24/2020   Acute left-sided low back pain with right-sided sciatica 02/14/2019   Insulin dependent type 2 diabetes mellitus (HCC) 07/19/2016   Diabetes (HCC) 01/18/2016   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   Coronary artery disease 01/12/2016   Exertional angina (HCC)    Chest pain 11/11/2015   HTN (hypertension) 10/20/2015   Hyperlipidemia LDL  goal <70 10/20/2015   Preventative health care 09/29/2014   Obesity (BMI 30-39.9) 09/26/2013   Tobacco use disorder 09/26/2013    ONSET DATE: 04/12/23  REFERRING DIAG:  Diagnosis  I63.9 (ICD-10-CM) - Ischemic cerebrovascular accident (CVA) (HCC)  R29.898 (ICD-10-CM) - Right hand weakness    THERAPY DIAG:  Abnormal posture  Muscle weakness (generalized)  Other lack of coordination  Visuospatial deficit  Other abnormalities of gait and mobility  Rationale for Evaluation and Treatment: Rehabilitation  SUBJECTIVE:   SUBJECTIVE STATEMENT: Pt reports mild back pain continues Pt accompanied by: self  PERTINENT HISTORY: Per chart 62 y.o. male presented to emergency department 04/12/23 with complaining of right-sided weakness; CTA head which showed left MCA territory infarction along with ICA severe stenosis as well as occlusion of the left vertebral artery. MRI multiple acute infarcts L MCA and PCA territories, old R frontal infarct PMH significant of CAD status post CABG, hypertension, insulin-dependent DM type II, hyperlipidemia, aspirin allergy, chronic alcohol use, and obesity   PRECAUTIONS: Fall   PAIN:  Are you having pain? Yes: NPRS scale: 5/10 Pain location: back Pain description: aching Aggravating factors: standing Relieving factors: rest, heat , OT will not address directly as pt is seeing PT for this  FALLS: Has patient fallen in last 6 months? No  LIVING ENVIRONMENT: Lives with: lives alone Lives in: House/apartment Stairs: No Has following equipment at home: Environmental consultant - 4 wheeled, Wheelchair (manual), and shower chair  PLOF: Independent  PATIENT GOALS: improve use of right hand   OBJECTIVE:  Note: Objective measures were completed at Evaluation unless otherwise noted.  HAND DOMINANCE: Right  ADLs: Overall ADLs: increased time required and difficulty using R dominant hand due to weakness and incoordination Transfers/ambulation related to  ADLs: Eating: difficulty feeding self with RUE, and cutting food  Grooming: difficulty shaving pt cut himself UB Dressing: mod I LB Dressing: mod I Toileting: mod i Bathing: mod I  Tub Shower transfers: mod I Equipment: Shower seat with back  IADLs: Shopping: does light shopping  Meal Prep: using microwave  Community mobility: mod I Medication management: keeps  up with own meds Financial management: grandson helps Handwriting: Not legible  MOBILITY STATUS: Independent       UPPER EXTREMITY ROM:    Active ROM Right eval Left eval  Shoulder flexion 125   Shoulder abduction    Shoulder adduction    Shoulder extension    Shoulder internal rotation    Shoulder external rotation    Elbow flexion    Elbow extension -10   Wrist flexion 63   Wrist extension 40   Wrist ulnar deviation    Wrist radial deviation    Wrist pronation    Wrist supination    (Blank rows = not tested)   HAND FUNCTION: Grip strength: Right: 40 lbs; Left: 75 lbs  COORDINATION: 9 Hole Peg test: Right: 1 min 45 sec; Left: 33.34 sec   SENSATION: Mild numbness in RUE  EDEMA: mild edema in RUE    COGNITION: Overall cognitive status:  to be further  assessed in a functional context  VISION: Subjective report: Pt reports he got new glasses but they are not working as well. Baseline vision: Wears glasses for reading only   VISION ASSESSMENT: Visual Fields: per hospital notes right visual field deficits, per gross assessment no significant deficit noted will assess in functional context number cancellation: 2/44 missed Pt was cautioned to exercise caution and double check on the right side when driving, as pt reports MD cleared him to drive      OBSERVATIONS: Pleasant male with speech difficulties is agreeable to OT treatment   TODAY'S TREATMENT:                                                                                                                              DATE:  05/09/23- Cane exercises for shoulder flexion, abduction and biceps curls 10-15 reps each, min v.c  Fucntional reaching mid range to place large pegs in vertical pegboard with RUE, mod v.c/ mod difficulty and drops Placing and removing graded clothespins from target with RUE for sustained pinch 1-8#, min v.c  Flipping and dealing playing cards with RUE for increased fine motor coordiantion, min v.c  UBE x 6 mins level 1 for conditioning  05/02/23 Pt was instructed in initial HEP for cane exercises in sitting and exercises for wrist, forearm and hand and beginning coordination, min v.c and demonstration. Fit of prefab resting splint was assessed however since pt now has increased function in hand, therapist recommends pt does not wear splint so he can use hand. Arm bike x 6 mins level 1 for conditioning 04/26/23 -eval only  PATIENT EDUCATION: Education details: icane exercises, visual scanning/ compensations Person educated: Patient Education method: Verbal cues, demonstration, handout Education comprehension: verbalized understanding, returned demonstration  HOME EXERCISE PROGRAM: Cane exercises, beginning, coordination 05/02/23   GOALS: Goals reviewed with patient? Yes  SHORT TERM GOALS: Target date: 05/25/23  I with initial HEP  Goal status: I issued needs reinforcement 05/02/23  2.  Pt will demonstrate improved fine motor coordination as evidenced by decreasing 9 hole peg test score by 5 secs from initial. Baseline: 1 min 45 secs Goal status: INITIAL  3.  Pt will demonstrate understanding of edema control techniques and sensory precautions  Goal status: INITIAL  4.  Pt will demonstrate grossly 90% composite finger flexion with RUE for increased functional use. Baseline: 85% Goal status: INITIAL  5.  Pt will report consistently feeding himself with RUE.  Goal status: INITIAL  6.  I with with adaptive equipment and strategies to maximize pt's safety and I with  ADLs/IADLS.  Goal status: INITIAL 7. Pt will perform environmental scanning with 90% or better accuracy. Goal status: initial  LONG TERM GOALS: Target date: 07/19/23  I with updated HEP. Baseline:  Goal status: INITIAL  2.  Pt will demonstrate improved fine motor coordination for ADLs as evidenced by decreasing 9 hole peg test score  to 90 secs Baseline: 1 min  45 secs Goal status: INITIAL  3.  Pt will increase RUE grip strength to 50 lbs for increased functional use. Baseline: 40 lbs Goal status: INITIAL  4.  Pt will resume use of RUE as dominant hand 50% of the time for ADLS/light IADLs.  Goal status: INITIAL  5.  Pt will demonstrate ability to retrieve a 3 lbs object from overhead shelf without drops x 5 reps  Goal status: INITIAL  6.  Pt will demonstrate ability to carry an 8 lbs bag of groceries with RUE.  Goal status: INITIAL   ASSESSMENT:  CLINICAL IMPRESSION: Pt is progressing towards goals with improving RUE coordination. Pt's RUE fatigues quickly requiring  frequent rest breaks.    PERFORMANCE DEFICITS: in functional skills including ADLs, IADLs, coordination, dexterity, sensation, edema, ROM, strength, pain, flexibility, Fine motor control, Gross motor control, mobility, balance, endurance, decreased knowledge of precautions, decreased knowledge of use of DME, vision, and UE functional use, cognitive skills including attention, problem solving, safety awareness, thought, and understand, and psychosocial skills including coping strategies, environmental adaptation, habits, interpersonal interactions, and routines and behaviors.   IMPAIRMENTS: are limiting patient from ADLs, IADLs, rest and sleep, work, play, leisure, and social participation.   CO-MORBIDITIES: may have co-morbidities  that affects occupational performance. Patient will benefit from skilled OT to address above impairments and improve overall function.  MODIFICATION OR ASSISTANCE TO COMPLETE  EVALUATION: No modification of tasks or assist necessary to complete an evaluation.  OT OCCUPATIONAL PROFILE AND HISTORY: Detailed assessment: Review of records and additional review of physical, cognitive, psychosocial history related to current functional performance.  CLINICAL DECISION MAKING: LOW - limited treatment options, no task modification necessary  REHAB POTENTIAL: Good  EVALUATION COMPLEXITY: Low    PLAN:  OT FREQUENCY: 1x/week plus  OT DURATION: 12 weeks (however may wrap up dependent upon progress and insurance limitations)  PLANNED INTERVENTIONS: 16109 OT Re-evaluation, 97535 self care/ADL training, 60454 therapeutic exercise, 97530 therapeutic activity, 97112 neuromuscular re-education, 97140 manual therapy, 97113 aquatic therapy, 97035 ultrasound, 97018 paraffin, 09811 moist heat, 97010 cryotherapy, 97129 Cognitive training (first 15 min), 91478 Cognitive training(each additional 15 min), 29562 Splinting (initial encounter), passive range of motion, functional mobility training, visual/perceptual remediation/compensation, psychosocial skills training, energy conservation, coping strategies training, patient/family education, and DME and/or AE instructions  RECOMMENDED OTHER SERVICES: ST  CONSULTED AND AGREED WITH PLAN OF CARE: Patient  PLAN FOR NEXT SESSION: NMR, coordiantion, functional use of RUE   Ernie Sagrero, OT 05/09/2023, 12:20 PM

## 2023-05-09 NOTE — Patient Instructions (Signed)
Visual compensations  1. Look for the edge of objects (to the left and/or right) so that you make sure you are seeing all of an object 2. Turn your head when walking, scan from side to side, particularly in busy environments 3. Use an organized scanning pattern. It's usually easier to scan from top to bottom, and left to right (like you are reading) 4. Double check yourself 5. Use a line guide (like a blank piece of paper) or your finger when reading

## 2023-05-09 NOTE — Therapy (Signed)
OUTPATIENT SPEECH LANGUAGE PATHOLOGY TREATMENT    Patient Name: Dennis Hopkins MRN: [id number] DOB:31-Oct-1960, 62 y.o., male Today's Date: 05/09/2023  PCP: Donato Schultz, DO REFERRING PROVIDER: Donato Schultz, DO  END OF SESSION:  End of Session - 05/09/23 1018     Visit Number 2    Number of Visits 17    Date for SLP Re-Evaluation 07/03/23    SLP Start Time 1015    SLP Stop Time  1055    SLP Time Calculation (min) 40 min    Activity Tolerance Patient tolerated treatment well             Past Medical History:  Diagnosis Date   Acute CVA (cerebrovascular accident) (HCC) 04/12/2023   Acute left-sided low back pain with right-sided sciatica 02/14/2019   Calcified granuloma of lung 09/06/2021   Chest pain 11/11/2015   Chickenpox    Chronic alcohol use 04/12/2023   Coronary artery disease 01/12/2016   Diabetes (HCC) 01/18/2016   Type 2     Diabetes mellitus (HCC) 04/13/2021   Eczema 04/12/2022   Essential hypertension    Exertional angina (HCC)    History of CAD (coronary artery disease) 04/12/2023   HTN (hypertension) 10/20/2015   Hyperlipemia    Hyperlipidemia    Hyperlipidemia LDL goal <70 10/20/2015   Hypokalemia 04/12/2023   Insulin dependent type 2 diabetes mellitus (HCC) 07/19/2016   Kidney stones 06/21/2011   Lumbar foraminal stenosis 01/05/2022   Morbid obesity (HCC) 04/12/2023   Obesity (BMI 30-39.9) 09/26/2013   Peripheral neuropathic pain 09/06/2021   Peripheral neuropathy 04/12/2023   Preventative health care 09/29/2014   Rash 08/24/2020   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   Snoring 08/24/2020   Tobacco use disorder 09/26/2013   Type 2 diabetes mellitus with diabetic peripheral angiopathy without gangrene, without long-term current use of insulin (HCC) 09/06/2021   Type 2 diabetes mellitus with hyperglycemia, without long-term current use of insulin (HCC) 04/13/2021   Urinary frequency 08/24/2020   Past Surgical History:  Procedure  Laterality Date   CARDIAC CATHETERIZATION N/A 01/07/2016   Procedure: Left Heart Cath and Coronary Angiography;  Surgeon: Corky Crafts, MD;  Location: [facility] INVASIVE CV LAB;  Service: Cardiovascular;  Laterality: N/A;   CORONARY ARTERY BYPASS GRAFT N/A 01/13/2016   Procedure: CORONARY ARTERY BYPASS GRAFTING (CABG) x4 Endoscopic Harvesting of the Right Greater Saphenous Vein;  Surgeon: Loreli Slot, MD;  Location: [facility] OR;  Service: Open Heart Surgery;  Laterality: N/A;   TEE WITHOUT CARDIOVERSION N/A 01/13/2016   Procedure: TRANSESOPHAGEAL ECHOCARDIOGRAM (TEE);  Surgeon: Loreli Slot, MD;  Location: [facility] OR;  Service: Open Heart Surgery;  Laterality: N/A;   TONSILLECTOMY     Patient Active Problem List   Diagnosis Date Noted   Hyperlipemia    Ischemic cerebrovascular accident (CVA) (HCC) 04/12/2023   History of CAD (coronary artery disease) 04/12/2023   Chronic alcohol use 04/12/2023   Hypokalemia 04/12/2023   Morbid obesity (HCC) 04/12/2023   Peripheral neuropathy 04/12/2023   Eczema 04/12/2022   Lumbar foraminal stenosis 01/05/2022   Type 2 diabetes mellitus with diabetic peripheral angiopathy without gangrene, without long-term current use of insulin (HCC) 09/06/2021   Calcified granuloma of lung 09/06/2021   Peripheral neuropathic pain 09/06/2021   Type 2 diabetes mellitus with hyperglycemia, without long-term current use of insulin (HCC) 04/13/2021   Diabetes mellitus (HCC) 04/13/2021   Essential hypertension    Hyperlipidemia    Chickenpox    Urinary  frequency 08/24/2020   Snoring 08/24/2020   Rash 08/24/2020   Acute left-sided low back pain with right-sided sciatica 02/14/2019   Insulin dependent type 2 diabetes mellitus (HCC) 07/19/2016   Diabetes (HCC) 01/18/2016   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   Pt accompanied by: self  PERTINENT HISTORY: Per chart review: medical history significant of CAD status post CABG, hypertension, insulin-dependent DM type II, hyperlipidemia, aspirin allergy, chronic alcohol use   PAIN:  Are you having pain? Yes: NPRS scale: 7/10 Pain location: lower back Pain description: throbbing  Aggravating factors: sitting Relieving factors: medicine    OBJECTIVE:   PATIENT REPORTED OUTCOME MEASURES (PROM): Communication Effectiveness Survey 19   TODAY'S TREATMENT:                                                                                                                                         DATE:   05/09/23: Pt was seen for skilled ST services targeting education on relaxation and speech. Pt reported he is having some anxiety at night time where he feels like he's short of breath  - concerned that he won't wake up. He reports shortness of breath is directly related to anxiety (he is not short of breath any other times). He attempts to breathe deeper and feels like this helps.   We discussed healthy ways to keep his mind active. Pt picked items off a pre-generated list of things he likes to do (or used to like to do). He chose re: reading, talking with people at work, walking. SLP educated on and demonstrated relaxation/breathing exercises. SLP educated on the purpose of abdominal/diaphragmatic breathing for speech as well as relaxation. After educating on 4 relaxation strategies, pt reported he is most likely to try re: 4:7:8 breathing (MINUS the  breath hold due to cardiovascular contraindications) and "belly breathing". SLP encouraged pt to "romanticize" his night time routine to create a more relaxing environment. Turn off TV a little earlier, dim lights, read before bed, take shower, and begin relaxation exercise as soon as he is in bed.   Briefly educated on speech strategy re: increased loudness to increase intelligibility. To begin speech strategy education next session. *AND ASSESS MEDICATION MANAGEMENT IN UPCOMING SESSIONS  PATIENT EDUCATION: Education details: Cognition, dysarthria, SLP role Person educated: Patient Education method: Explanation Education comprehension: needs further education   GOALS:  Goals reviewed with patient? Yes  SHORT TERM GOALS: Target date: 06/03/23  Pt will recall 3 strategies to improve intelligibility with minA verbal cues. Baseline: Goal status: INITIAL  2.  Pt will recall 3 strategies to improve memory of important information. Baseline:  Goal status: INITIAL  3.  Pt will recall 2+ organization strategies to assist with managing at home tasks. Baseline:  Goal status: INITIAL    LONG TERM GOALS: Target date: 07/03/22  Pt will demonstrate intelligible speech by utilizing learned compensatory strategies in unstructured conversation.  Baseline:  Goal status: INITIAL  2.  Pt will improve score on PROMs.  Baseline:  Goal status: INITIAL  3.  Pt will report improved management of bills/home tasks.  Baseline:  Goal status: INITIAL    ASSESSMENT:  CLINICAL IMPRESSION: Pt is a 62  yo male  who presents to ST OP for evaluation post CVA. See treatment note. SLP rec skilled ST services to address dysarthria and cognitive-communication impairment.    OBJECTIVE IMPAIRMENTS: include memory and dysarthria. These impairments are limiting patient from managing medications, managing appointments, managing finances, and effectively communicating at home and in community. Factors affecting  potential to achieve goals and functional outcome are  NA . Patient will benefit from skilled SLP services to address above impairments and improve overall function.  REHAB POTENTIAL: Good  PLAN:  SLP FREQUENCY: 1-2x/week  SLP DURATION: 8 weeks  PLANNED INTERVENTIONS: Environmental controls, Cueing hierachy, Internal/external aids, Functional tasks, SLP instruction and feedback, Compensatory strategies, and Patient/family education    [address], CCC-SLP 05/09/2023, 10:18 AM

## 2023-05-12 ENCOUNTER — Telehealth: Payer: Self-pay | Admitting: Family Medicine

## 2023-05-12 DIAGNOSIS — M48061 Spinal stenosis, lumbar region without neurogenic claudication: Secondary | ICD-10-CM

## 2023-05-12 MED ORDER — METHOCARBAMOL 500 MG PO TABS: 500.0000 mg | ORAL_TABLET | Freq: Four times a day (QID) | ORAL | 2 refills | Status: DC

## 2023-05-12 NOTE — Telephone Encounter (Signed)
Rx sent 

## 2023-05-12 NOTE — Addendum Note (Signed)
Addended by: Roxanne Gates on: 05/12/2023 02:23 PM   Modules accepted: Orders

## 2023-05-12 NOTE — Telephone Encounter (Signed)
Prescription Request  05/12/2023  Is this a "Controlled Substance" medicine? No  LOV: 04/20/2023  What is the name of the medication or equipment?   methocarbamol (ROBAXIN) 500 MG tablet  Have you contacted your pharmacy to request a refill? No   Which pharmacy would you like this sent to?  [facility] [id number] - [place], [place] - [address] [place] [address] Phone: [phone] Fax: [phone]      Patient notified that their request is being sent to the clinical staff for review and that they should receive a response within 2 business days.   Please advise at [facility] [phone]

## 2023-05-15 ENCOUNTER — Ambulatory Visit: Payer: PRIVATE HEALTH INSURANCE

## 2023-05-15 ENCOUNTER — Ambulatory Visit: Payer: PRIVATE HEALTH INSURANCE | Admitting: Adult Health

## 2023-05-15 ENCOUNTER — Encounter: Payer: Self-pay | Admitting: Adult Health

## 2023-05-15 VITALS — BP 118/68 | HR 62 | Ht 70.0 in | Wt 222.0 lb

## 2023-05-15 VITALS — BP 124/70 | HR 59 | Ht 71.0 in | Wt 220.5 lb

## 2023-05-15 DIAGNOSIS — I639 Cerebral infarction, unspecified: Secondary | ICD-10-CM

## 2023-05-15 DIAGNOSIS — R002 Palpitations: Secondary | ICD-10-CM

## 2023-05-15 DIAGNOSIS — E785 Hyperlipidemia, unspecified: Secondary | ICD-10-CM

## 2023-05-15 DIAGNOSIS — E1151 Type 2 diabetes mellitus with diabetic peripheral angiopathy without gangrene: Secondary | ICD-10-CM | POA: Diagnosis not present

## 2023-05-15 DIAGNOSIS — I2510 Atherosclerotic heart disease of native coronary artery without angina pectoris: Secondary | ICD-10-CM | POA: Diagnosis not present

## 2023-05-15 MED ORDER — EZETIMIBE 10 MG PO TABS: 10.0000 mg | ORAL_TABLET | Freq: Every day | ORAL | 2 refills | Status: AC

## 2023-05-15 NOTE — Therapy (Unsigned)
OUTPATIENT OCCUPATIONAL THERAPY NEURO Treatment  Patient Name: NADEEM VAHLE MRN: [id number] DOB:1960-08-19, 62 y.o., male Today's Date: 05/16/2023  PCP: Dr. Laury Axon REFERRING PROVIDER: Dr. Laury Axon  END OF SESSION:  OT End of Session - 05/16/23 1218     Visit Number 4    Number of Visits 13    Date for OT Re-Evaluation 07/19/23    Authorization Type Generic provider    Authorization - Visit Number 3    Authorization - Number of Visits 12    OT Start Time 0850    OT Stop Time 0930    OT Time Calculation (min) 40 min                Past Medical History:  Diagnosis Date   Acute CVA (cerebrovascular accident) (HCC) 04/12/2023   Acute left-sided low back pain with right-sided sciatica 02/14/2019   Calcified granuloma of lung 09/06/2021   Chest pain 11/11/2015   Chickenpox    Chronic alcohol use 04/12/2023   Coronary artery disease 01/12/2016   Diabetes (HCC) 01/18/2016   Type 2     Diabetes mellitus (HCC) 04/13/2021   Eczema 04/12/2022   Essential hypertension    Exertional angina (HCC)    History of CAD (coronary artery disease) 04/12/2023   HTN (hypertension) 10/20/2015   Hyperlipemia    Hyperlipidemia    Hyperlipidemia LDL goal <70 10/20/2015   Hypokalemia 04/12/2023   Insulin dependent type 2 diabetes mellitus (HCC) 07/19/2016   Kidney stones 06/21/2011   Lumbar foraminal stenosis 01/05/2022   Morbid obesity (HCC) 04/12/2023   Obesity (BMI 30-39.9) 09/26/2013   Peripheral neuropathic pain 09/06/2021   Peripheral neuropathy 04/12/2023   Preventative health care 09/29/2014   Rash 08/24/2020   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   Snoring 08/24/2020   Tobacco use disorder 09/26/2013   Type 2 diabetes mellitus with diabetic peripheral angiopathy without gangrene, without long-term current use of insulin (HCC) 09/06/2021   Type 2 diabetes mellitus with hyperglycemia, without long-term current use of insulin (HCC) 04/13/2021   Urinary frequency 08/24/2020   Past  Surgical History:  Procedure Laterality Date   CARDIAC CATHETERIZATION N/A 01/07/2016   Procedure: Left Heart Cath and Coronary Angiography;  Surgeon: Corky Crafts, MD;  Location: [facility] INVASIVE CV LAB;  Service: Cardiovascular;  Laterality: N/A;   CORONARY ARTERY BYPASS GRAFT N/A 01/13/2016   Procedure: CORONARY ARTERY BYPASS GRAFTING (CABG) x4 Endoscopic Harvesting of the Right Greater Saphenous Vein;  Surgeon: Loreli Slot, MD;  Location: [facility] OR;  Service: Open Heart Surgery;  Laterality: N/A;   TEE WITHOUT CARDIOVERSION N/A 01/13/2016   Procedure: TRANSESOPHAGEAL ECHOCARDIOGRAM (TEE);  Surgeon: Loreli Slot, MD;  Location: [facility] OR;  Service: Open Heart Surgery;  Laterality: N/A;   TONSILLECTOMY     Patient Active Problem List   Diagnosis Date Noted   Hyperlipemia    Ischemic cerebrovascular accident (CVA) (HCC) 04/12/2023   History of CAD (coronary artery disease) 04/12/2023   Chronic alcohol use 04/12/2023   Hypokalemia 04/12/2023   Morbid obesity (HCC) 04/12/2023   Peripheral neuropathy 04/12/2023   Eczema 04/12/2022   Lumbar foraminal stenosis 01/05/2022   Type 2 diabetes mellitus with diabetic peripheral angiopathy without gangrene, without long-term current use of insulin (HCC) 09/06/2021   Calcified granuloma of lung 09/06/2021   Peripheral neuropathic pain 09/06/2021   Type 2 diabetes mellitus with hyperglycemia, without long-term current use of insulin (HCC) 04/13/2021   Diabetes mellitus (HCC) 04/13/2021   Essential hypertension  Hyperlipidemia    Chickenpox    Urinary frequency 08/24/2020   Snoring 08/24/2020   Rash 08/24/2020   Acute left-sided low back pain with right-sided sciatica 02/14/2019   Insulin dependent type 2 diabetes mellitus (HCC) 07/19/2016   Diabetes (HCC) 01/18/2016   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   CAD, s/p CABG in 2017 [LIMA-LAD, SVG-OM1, SVG-RPDA] 01/12/2016   Exertional angina (HCC)    Chest pain 11/11/2015   HTN (hypertension)  10/20/2015   Hyperlipidemia LDL goal <70 10/20/2015   Preventative health care 09/29/2014   Obesity (BMI 30-39.9) 09/26/2013   Tobacco use disorder 09/26/2013    ONSET DATE: 04/12/23  REFERRING DIAG:  Diagnosis  I63.9 (ICD-10-CM) - Ischemic cerebrovascular accident (CVA) (HCC)  R29.898 (ICD-10-CM) - Right hand weakness    THERAPY DIAG:  Muscle weakness (generalized)  Other lack of coordination  Visuospatial deficit  Other abnormalities of gait and mobility  Rationale for Evaluation and Treatment: Rehabilitation  SUBJECTIVE:   SUBJECTIVE STATEMENT: Pt reports significant back pain  PERTINENT HISTORY: Per chart 62 y.o. male presented to emergency department 04/12/23 with complaining of right-sided weakness; CTA head which showed left MCA territory infarction along with ICA severe stenosis as well as occlusion of the left vertebral artery. MRI multiple acute infarcts L MCA and PCA territories, old R frontal infarct PMH significant of CAD status post CABG, hypertension, insulin-dependent DM type II, hyperlipidemia, aspirin allergy, chronic alcohol use, and obesity   PRECAUTIONS: Fall   PAIN:  Are you having pain? Yes: NPRS scale: 5/10 Pain location: back Pain description: aching Aggravating factors: standing Relieving factors: rest, heat , OT will not address directly as pt is seeing PT for this  FALLS: Has patient fallen in last 6 months? No  LIVING ENVIRONMENT: Lives with: lives alone Lives in: House/apartment Stairs: No Has following equipment at home: Environmental consultant - 4 wheeled, Wheelchair (manual), and shower chair  PLOF: Independent  PATIENT GOALS: improve use of right hand   OBJECTIVE:  Note: Objective measures were completed at Evaluation unless otherwise noted.  HAND DOMINANCE: Right  ADLs: Overall ADLs: increased time required and difficulty using R dominant hand due to weakness and incoordination Transfers/ambulation related to ADLs: Eating: difficulty  feeding self with RUE, and cutting food  Grooming: difficulty shaving pt cut himself UB Dressing: mod I LB Dressing: mod I Toileting: mod i Bathing: mod I  Tub Shower transfers: mod I Equipment: Shower seat with back  IADLs: Shopping: does light shopping  Meal Prep: using microwave  Community mobility: mod I Medication management: keeps  up with own meds Financial management: grandson helps Handwriting: Not legible  MOBILITY STATUS: Independent       UPPER EXTREMITY ROM:    Active ROM Right eval Left eval  Shoulder flexion 125   Shoulder abduction    Shoulder adduction    Shoulder extension    Shoulder internal rotation    Shoulder external rotation    Elbow flexion    Elbow extension -10   Wrist flexion 63   Wrist extension 40   Wrist ulnar deviation    Wrist radial deviation    Wrist pronation    Wrist supination    (Blank rows = not tested)   HAND FUNCTION: Grip strength: Right: 40 lbs; Left: 75 lbs  COORDINATION: 9 Hole Peg test: Right: 1 min 45 sec; Left: 33.34 sec   SENSATION: Mild numbness in RUE  EDEMA: mild edema in RUE    COGNITION: Overall cognitive status:  to be  further assessed in a functional context  VISION: Subjective report: Pt reports he got new glasses but they are not working as well. Baseline vision: Wears glasses for reading only   VISION ASSESSMENT: Visual Fields: per hospital notes right visual field deficits, per gross assessment no significant deficit noted will assess in functional context number cancellation: 2/44 missed Pt was cautioned to exercise caution and double check on the right side when driving, as pt reports MD cleared him to drive      OBSERVATIONS: Pleasant male with speech difficulties is agreeable to OT treatment   TODAY'S TREATMENT:                                                                                                                              DATE:05/16/23- Seated Using 2 lbs  cane pt performed 20 reps each for shoulder flexion, chest press, and biceps curls, min v.c Hotpack applied to back x 15 mins while seated, for pain no adverse reactions. Copying small peg design with RUE for increased fine motor coordination, min-mod difficulty and increased time required. Flipping playing cards, stacking coins and manipulating in hand for increased fine motor coordiantion, min v.c   UBE x 6 mins level 1 for conditioning.    05/09/23- Cane exercises for shoulder flexion, abduction and biceps curls 10-15 reps each, min v.c  Functional reaching mid range to place large pegs in vertical pegboard with RUE, mod v.c/ mod difficulty and drops Placing and removing graded clothespins from target with RUE for sustained pinch 1-8#, min v.c  Flipping and dealing playing cards with RUE for increased fine motor coordiantion, min v.c  UBE x 6 mins level 1 for conditioning  05/02/23 Pt was instructed in initial HEP for cane exercises in sitting and exercises for wrist, forearm and hand and beginning coordination, min v.c and demonstration. Fit of prefab resting splint was assessed however since pt now has increased function in hand, therapist recommends pt does not wear splint so he can use hand. Arm bike x 6 mins level 1 for conditioning 04/26/23 -eval only  PATIENT EDUCATION: Education details: icane exercises, visual scanning/ compensations Person educated: Patient Education method: Verbal cues, demonstration, handout Education comprehension: verbalized understanding, returned demonstration  HOME EXERCISE PROGRAM: Cane exercises, beginning, coordination 05/02/23   GOALS: Goals reviewed with patient? Yes  SHORT TERM GOALS: Target date: 05/25/23  I with initial HEP  Goal status: I issued needs reinforcement 05/02/23  2.  Pt will demonstrate improved fine motor coordination as evidenced by decreasing 9 hole peg test score by 5 secs from initial. Baseline: 1 min 45 secs Goal  status: INITIAL  3.  Pt will demonstrate understanding of edema control techniques and sensory precautions  Goal status: INITIAL  4.  Pt will demonstrate grossly 90% composite finger flexion with RUE for increased functional use. Baseline: 85% Goal status: INITIAL  5.  Pt will report consistently feeding himself with RUE.  Goal status: INITIAL  6.  I with with adaptive equipment and strategies to maximize pt's safety and I with ADLs/IADLS.  Goal status: INITIAL 7. Pt will perform environmental scanning with 90% or better accuracy. Goal status: initial  LONG TERM GOALS: Target date: 07/19/23  I with updated HEP. Baseline:  Goal status: INITIAL  2.  Pt will demonstrate improved fine motor coordination for ADLs as evidenced by decreasing 9 hole peg test score  to 90 secs Baseline: 1 min 45 secs Goal status: INITIAL  3.  Pt will increase RUE grip strength to 50 lbs for increased functional use. Baseline: 40 lbs Goal status: INITIAL  4.  Pt will resume use of RUE as dominant hand 50% of the time for ADLS/light IADLs.  Goal status: INITIAL  5.  Pt will demonstrate ability to retrieve a 3 lbs object from overhead shelf without drops x 5 reps  Goal status: INITIAL  6.  Pt will demonstrate ability to carry an 8 lbs bag of groceries with RUE.  Goal status: INITIAL   ASSESSMENT:  CLINICAL IMPRESSION: Pt is progressing towards goals with improving RUE coordination. He continues to be limited by endurance.    PERFORMANCE DEFICITS: in functional skills including ADLs, IADLs, coordination, dexterity, sensation, edema, ROM, strength, pain, flexibility, Fine motor control, Gross motor control, mobility, balance, endurance, decreased knowledge of precautions, decreased knowledge of use of DME, vision, and UE functional use, cognitive skills including attention, problem solving, safety awareness, thought, and understand, and psychosocial skills including coping strategies,  environmental adaptation, habits, interpersonal interactions, and routines and behaviors.   IMPAIRMENTS: are limiting patient from ADLs, IADLs, rest and sleep, work, play, leisure, and social participation.   CO-MORBIDITIES: may have co-morbidities  that affects occupational performance. Patient will benefit from skilled OT to address above impairments and improve overall function.  MODIFICATION OR ASSISTANCE TO COMPLETE EVALUATION: No modification of tasks or assist necessary to complete an evaluation.  OT OCCUPATIONAL PROFILE AND HISTORY: Detailed assessment: Review of records and additional review of physical, cognitive, psychosocial history related to current functional performance.  CLINICAL DECISION MAKING: LOW - limited treatment options, no task modification necessary  REHAB POTENTIAL: Good  EVALUATION COMPLEXITY: Low    PLAN:  OT FREQUENCY: 1x/week plus  OT DURATION: 12 weeks (however may wrap up dependent upon progress and insurance limitations)  PLANNED INTERVENTIONS: 66440 OT Re-evaluation, 97535 self care/ADL training, 34742 therapeutic exercise, 97530 therapeutic activity, 97112 neuromuscular re-education, 97140 manual therapy, 97113 aquatic therapy, 97035 ultrasound, 97018 paraffin, 59563 moist heat, 97010 cryotherapy, 97129 Cognitive training (first 15 min), 87564 Cognitive training(each additional 15 min), 33295 Splinting (initial encounter), passive range of motion, functional mobility training, visual/perceptual remediation/compensation, psychosocial skills training, energy conservation, coping strategies training, patient/family education, and DME and/or AE instructions  RECOMMENDED OTHER SERVICES: ST  CONSULTED AND AGREED WITH PLAN OF CARE: Patient  PLAN FOR NEXT SESSION: NMR, coordiantion, functional use of RUE   Lovell Roe, OT 05/16/2023, 12:29 PM

## 2023-05-15 NOTE — Progress Notes (Signed)
PATIENT: Dennis Hopkins DOB: 06-04-1961  REASON FOR VISIT: follow up HISTORY FROM: patient PRIMARY NEUROLOGIST: Dr. Pearlean Brownie  Chief Complaint  Patient presents with   Hospitalization Follow-up    Rm 19, alone.  Doing PT/OT/ST [facility].  Has some drooling,.       HISTORY OF PRESENT ILLNESS: Today 05/15/23  Dennis Hopkins is a 62 y.o. 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  He remains on Brilinta.  He has already followed up with his primary care.  He is taking Crestor and Zetia for his cholesterol.  Primary care is managing his diabetes.  He states that since his stroke he still has trouble with his speech and weakness in the right hand.  Denies any trouble with his gait.  He is still in PT/OT/ST.  No longer drinking alcohol.  Does not smoke.  He returns today for an evaluation.  HISTORY Acute Ischemic Infarct:  Scattered acute infarcts in the left MCA and PCA territories  Etiology:  significant widespread intracranial atherosclerosis  Code Stroke CT head - Patchy loss of gray-white differentiation in the high left frontal and parietal lobes, suspicious for acute left MCA territory infarcts. No acute hemorrhage. 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 Moderate to severe stenosis of the left ICA communicating segment, just proximal to the origin of the left posterior communicating artery. Moderate to severe stenosis of the right ICA supraclinoid segment. At least 80% stenosis of the proximal right cervical ICA. Approximately 70% stenosis of the proximal left cervical ICA. 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 MRI  Multiple acute infarcts within the left hemisphere, affecting the MCA and PCA territories. 2D Echo EF greater than 75%.  Left atrial size mildly dilated LDL 162 HgbA1c 9.1 VTE  prophylaxis - lovenox No antithrombotic (prescribed plavix) prior to admission, now on clopidogrel 75 mg daily switch to brilinta for a minimum of 30 days and then back to plavix  Therapy recommendations:  Outpatient PT/OT/ST Disposition:  Pending   REVIEW OF SYSTEMS: Out of a complete 14 system review of symptoms, the patient complains only of the following symptoms, and all other reviewed systems are negative.  ALLERGIES: Allergies  Allergen Reactions   Asa [Aspirin] Anaphylaxis, Swelling and Other (See Comments)    Lips swelling    HOME MEDICATIONS: Outpatient Medications Prior to Visit  Medication Sig Dispense Refill   Continuous Glucose Receiver (DEXCOM G7 RECEIVER) DEVI As directed 1 each 0   Continuous Glucose Sensor (DEXCOM G7 SENSOR) MISC Use to check blood glucose at least 4 times daily. Change sensor every 10 days. 9 each 1   dapagliflozin propanediol (FARXIGA) 10 MG TABS tablet Take 1 tablet (10 mg total) by mouth daily before breakfast. (Patient not taking: Reported on 04/12/2023) 30 tablet 2   ezetimibe (ZETIA) 10 MG tablet Take 1 tablet (10 mg total) by mouth daily. 30 tablet 1   folic acid (FOLVITE) 1 MG tablet Take 1 tablet (1 mg total) by mouth daily. 30 tablet 0   furosemide (LASIX) 20 MG tablet Take 1 tablet (20 mg total) by mouth daily. (Patient not taking: Reported on 04/12/2023) 90 tablet 0   gabapentin (NEURONTIN) 100 MG capsule TAKE 1 TO 3 CAPSULES BY MOUTH THREE TIMES DAILY 270 capsule 1   HUMALOG KWIKPEN 100 UNIT/ML  KwikPen INJECT 8 TO 18 UNITS SUBCUTANEOUSLY THREE TIMES DAILY (Patient taking differently: Inject 8-18 Units into the skin 3 (three) times daily.) 15 mL 3   Insulin Pen Needle 32G X 4 MM MISC 1 Device by Does not apply route in the morning, at noon, in the evening, and at bedtime. 400 each 2   LANTUS SOLOSTAR 100 UNIT/ML Solostar Pen Inject 40 Units into the skin daily.     methocarbamol (ROBAXIN) 500 MG tablet Take 1 tablet (500 mg total) by mouth 4  (four) times daily. 45 tablet 2   metoprolol (TOPROL-XL) 200 MG 24 hr tablet Take 1 tablet (200 mg total) by mouth daily.     rosuvastatin (CRESTOR) 20 MG tablet Take 2 tablets (40 mg total) by mouth daily. **NOTE DOSE 40 MG NOT IN STOCK** 60 tablet 1   thiamine (VITAMIN B1) 100 MG tablet Take 1 tablet (100 mg total) by mouth daily. 30 tablet 0   ticagrelor (BRILINTA) 90 MG TABS tablet Take 1 tablet (90 mg total) by mouth 2 (two) times daily. 60 tablet 0   traMADol (ULTRAM) 50 MG tablet Take 1 tablet (50 mg total) by mouth every 6 (six) hours as needed for moderate pain (pain score 4-6). 90 tablet 0   No facility-administered medications prior to visit.    PAST MEDICAL HISTORY: Past Medical History:  Diagnosis Date   Acute CVA (cerebrovascular accident) (HCC) 04/12/2023   Acute left-sided low back pain with right-sided sciatica 02/14/2019   Calcified granuloma of lung 09/06/2021   Chest pain 11/11/2015   Chickenpox    Chronic alcohol use 04/12/2023   Coronary artery disease 01/12/2016   Diabetes (HCC) 01/18/2016   Type 2     Diabetes mellitus (HCC) 04/13/2021   Eczema 04/12/2022   Essential hypertension    Exertional angina (HCC)    History of CAD (coronary artery disease) 04/12/2023   HTN (hypertension) 10/20/2015   Hyperlipemia    Hyperlipidemia    Hyperlipidemia LDL goal <70 10/20/2015   Hypokalemia 04/12/2023   Insulin dependent type 2 diabetes mellitus (HCC) 07/19/2016   Kidney stones 06/21/2011   Lumbar foraminal stenosis 01/05/2022   Morbid obesity (HCC) 04/12/2023   Obesity (BMI 30-39.9) 09/26/2013   Peripheral neuropathic pain 09/06/2021   Peripheral neuropathy 04/12/2023   Preventative health care 09/29/2014   Rash 08/24/2020   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 INVASIVE CV LAB;  Service: Cardiovascular;  Laterality: N/A;   CORONARY ARTERY BYPASS GRAFT N/A 01/13/2016   Procedure: CORONARY ARTERY BYPASS GRAFTING (CABG) x4 Endoscopic Harvesting of the Right Greater Saphenous Vein;  Surgeon: Loreli Slot, MD;  Location[facility] OR;  Service: Open Heart Surgery;  Laterality: N/A;   TEE WITHOUT CARDIOVERSION N/A 01/13/2016   Procedure: TRANSESOPHAGEAL ECHOCARDIOGRAM (TEE);  Surgeon: Loreli Slot, MD;  Location: [facility] OR;  Service: Open Heart Surgery;  Laterality: N/A;   TONSILLECTOMY      FAMILY HISTORY: Family History  Problem Relation Age of Onset  Arthritis Mother    Diabetes Mother    Arthritis Father    Hyperlipidemia Father    Diabetes Father    Diabetes Brother     SOCIAL HISTORY: Social History   Socioeconomic History   Marital status: Widowed    Spouse name: Not on file   Number of children: 1   Years of education: Not on file   Highest education level: Not on file  Occupational History   Not on file  Tobacco Use   Smoking status: Former    Current packs/day: 0.00    Average packs/day: 2.0 packs/day for 35.0 years (70.0 ttl pk-yrs)    Types: Cigarettes    Start date: 12/22/1978    Quit date: 12/21/2013    Years since quitting: 9.4   Smokeless tobacco: Never  Vaping Use   Vaping status: Never Used  Substance and Sexual Activity   Alcohol use: Yes    Alcohol/week: 0.0 standard drinks of alcohol    Comment: 1-2 drinks per night- wife reports he drinks more    Drug use: No   Sexual activity: Yes    Partners: Female  Other Topics Concern   Not on file  Social History Narrative   Not  on file   Social Determinants of Health   Financial Resource Strain: Low Risk  (11/22/2021)   Overall Financial Resource Strain (CARDIA)    Difficulty of Paying Living Expenses: Not very hard  Food Insecurity: Not on file  Transportation Needs: Not on file  Physical Activity: Not on file  Stress: Not on file  Social Connections: Not on file  Intimate Partner Violence: Not on file      PHYSICAL EXAM  Vitals:   05/15/23 0925  BP: 124/70  Pulse: (!) 59  Weight: 220 lb 8 oz (100 kg)  Height: 5\' 11"  (1.803 m)   Body mass index is 30.75 kg/m.  Generalized: Well developed, in no acute distress   Neurological examination  Mentation: Alert oriented to time, place, history taking. Follows all commands speech and language fluent Cranial nerve II-XII: Pupils were equal round reactive to light. Extraocular movements were full, visual field were full on confrontational test.  Facial droop-right lower face.   Head turning and shoulder shrug  were normal and symmetric. Motor: The motor testing reveals 5 over 5 strength of all 4 extremities.  Decreased grip strength in the right hand good symmetric motor tone is noted throughout.  Sensory: Sensory testing is intact to soft touch on all 4 extremities. No evidence of extinction is noted.  Coordination: Cerebellar testing reveals good finger-nose-finger and heel-to-shin bilaterally.  Gait and station: Gait is normal. .  Reflexes: Deep tendon reflexes are symmetric and normal bilaterally.   DIAGNOSTIC DATA (LABS, IMAGING, TESTING) - I reviewed patient records, labs, notes, testing and imaging myself where available.  Lab Results  Component Value Date   WBC 10.1 04/20/2023   HGB 16.0 04/20/2023   HCT 49.3 04/20/2023   MCV 89.9 04/20/2023   PLT 242.0 04/20/2023      Component Value Date/Time   NA 139 04/20/2023 1219   NA 139 09/23/2020 0000   K 4.2 04/20/2023 1219   CL 99 04/20/2023 1219   CO2 29 04/20/2023 1219   GLUCOSE 139 (H)  04/20/2023 1219   BUN 21 04/20/2023 1219   BUN 24 (A) 09/23/2020 0000   CREATININE 1.48 04/20/2023 1219   CREATININE 1.04 02/17/2023 1344   CALCIUM 10.4 04/20/2023 1219   PROT 8.1 04/20/2023  1219   ALBUMIN 4.5 04/20/2023 1219   AST 33 04/20/2023 1219   ALT 39 04/20/2023 1219   ALKPHOS 74 04/20/2023 1219   BILITOT 0.7 04/20/2023 1219   GFRNONAA >60 04/15/2023 0519   GFRAA >60 01/18/2016 0749   Lab Results  Component Value Date   CHOL 221 (H) 04/13/2023   HDL 38 (L) 04/13/2023   LDLCALC 162 (H) 04/13/2023   LDLDIRECT 173.0 02/07/2022   TRIG 107 04/13/2023   CHOLHDL 5.8 04/13/2023   Lab Results  Component Value Date   HGBA1C 9.1 (H) 02/17/2023   Lab Results  Component Value Date   VITAMINB12 322 02/17/2021   Lab Results  Component Value Date   TSH 1.39 02/07/2022      ASSESSMENT AND PLAN 62 y.o. year old male  has a past medical history of Acute CVA (cerebrovascular accident) (HCC) (04/12/2023), Acute left-sided low back pain with right-sided sciatica (02/14/2019), 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09/06/2021), Chest pain (11/11/2015), Chickenpox, Chronic alcohol use (04/12/2023), Coronary artery disease (01/12/2016), Diabetes (HCC) (01/18/2016), Diabetes mellitus (HCC) (04/13/2021), Eczema (04/12/2022), Essential hypertension, Exertional angina (HCC), History of CAD (coronary artery disease) (04/12/2023), HTN (hypertension) (10/20/2015), Hyperlipemia, Hyperlipidemia, Hyperlipidemia LDL goal <70 (10/20/2015), Hypokalemia (04/12/2023), Insulin dependent type 2 diabetes mellitus (HCC) (07/19/2016), Kidney stones (06/21/2011), Lumbar foraminal stenosis (01/05/2022), Morbid obesity (HCC) (04/12/2023), Obesity (BMI 30-39.9) (09/26/2013), Peripheral neuropathic pain (09/06/2021), Peripheral neuropathy (04/12/2023), Preventative health care (09/29/2014), Rash (08/24/2020), 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 here with:  Acute Ischemic Infarct:  Scattered acute infarcts in the left MCA and PCA territories   Continue Brilinta for 90 days then will switch to Plavix for secondary stroke prevention.  He was advised to call in 90 days and we will send in a new prescription. Discussed secondary stroke prevention measures and importance of close PCP follow up for aggressive stroke risk factor management. I have gone over the pathophysiology of stroke, warning signs and symptoms, risk factors and their management in some detail with instructions to go to the closest emergency room for symptoms of concern. HTN: BP goal <130/90.  HLD: LDL goal <70. Recent LDL 162 on crestor and zetia DMII: A1c goal<7.0. Recent A1c 9.1.  Encouraged patient to monitor diet and encouraged exercise FU with our office 8 months      Butch Penny, MSN, NP-C 05/15/2023, 9:25 AM [facility] [address] [place], [place] [address] [phone]

## 2023-05-15 NOTE — Therapy (Incomplete)
OUTPATIENT OCCUPATIONAL THERAPY NEURO EVALUATION  Patient Name: Dennis Hopkins MRN: [id number] DOB:[birth year], 62 y.o., male Today's Date: 05/15/2023  PCP: Dr. Laury Axon REFERRING PROVIDER: Dr. Laury Axon  END OF SESSION:      Past Medical History:  Diagnosis Date   Acute CVA (cerebrovascular accident) (HCC) 04/12/2023   Acute left-sided low back pain with right-sided sciatica 02/14/2019   Calcified granuloma of lung 09/06/2021   Chest pain 11/11/2015   Chickenpox    Chronic alcohol use 04/12/2023   Coronary artery disease 01/12/2016   Diabetes (HCC) 01/18/2016   Type 2     Diabetes mellitus (HCC) 04/13/2021   Eczema 04/12/2022   Essential hypertension    Exertional angina (HCC)    History of CAD (coronary artery disease) 04/12/2023   HTN (hypertension) 10/20/2015   Hyperlipemia    Hyperlipidemia    Hyperlipidemia LDL goal <70 10/20/2015   Hypokalemia 04/12/2023   Insulin dependent type 2 diabetes mellitus (HCC) 07/19/2016   Kidney stones 06/21/2011   Lumbar foraminal stenosis 01/05/2022   Morbid obesity (HCC) 04/12/2023   Obesity (BMI 30-39.9) 09/26/2013   Peripheral neuropathic pain 09/06/2021   Peripheral neuropathy 04/12/2023   Preventative health care 09/29/2014   Rash 08/24/2020   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   Snoring 08/24/2020   Tobacco use disorder 09/26/2013   Type 2 diabetes mellitus with diabetic peripheral angiopathy without gangrene, without long-term current use of insulin (HCC) 09/06/2021   Type 2 diabetes mellitus with hyperglycemia, without long-term current use of insulin (HCC) 04/13/2021   Urinary frequency 08/24/2020   Past Surgical History:  Procedure Laterality Date   CARDIAC CATHETERIZATION N/A 01/07/2016   Procedure: Left Heart Cath and Coronary Angiography;  Surgeon: Corky Crafts, MD;  Location: [facility] INVASIVE CV LAB;  Service: Cardiovascular;  Laterality: N/A;   CORONARY ARTERY BYPASS GRAFT N/A 01/13/2016   Procedure: CORONARY ARTERY BYPASS  GRAFTING (CABG) x4 Endoscopic Harvesting of the Right Greater Saphenous Vein;  Surgeon: Loreli Slot, MD;  Location: [facility] OR;  Service: Open Heart Surgery;  Laterality: N/A;   TEE WITHOUT CARDIOVERSION N/A 01/13/2016   Procedure: TRANSESOPHAGEAL ECHOCARDIOGRAM (TEE);  Surgeon: Loreli Slot, MD;  Location: [facility] OR;  Service: Open Heart Surgery;  Laterality: N/A;   TONSILLECTOMY     Patient Active Problem List   Diagnosis Date Noted   Hyperlipemia    Ischemic cerebrovascular accident (CVA) (HCC) 04/12/2023   History of CAD (coronary artery disease) 04/12/2023   Chronic alcohol use 04/12/2023   Hypokalemia 04/12/2023   Morbid obesity (HCC) 04/12/2023   Peripheral neuropathy 04/12/2023   Eczema 04/12/2022   Lumbar foraminal stenosis 01/05/2022   Type 2 diabetes mellitus with diabetic peripheral angiopathy without gangrene, without long-term current use of insulin (HCC) 09/06/2021   Calcified granuloma of lung 09/06/2021   Peripheral neuropathic pain 09/06/2021   Type 2 diabetes mellitus with hyperglycemia, without long-term current use of insulin (HCC) 04/13/2021   Diabetes mellitus (HCC) 04/13/2021   Essential hypertension    Hyperlipidemia    Chickenpox    Urinary frequency 08/24/2020   Snoring 08/24/2020   Rash 08/24/2020   Acute left-sided low back pain with right-sided sciatica 02/14/2019   Insulin dependent type 2 diabetes mellitus (HCC) 07/19/2016   Diabetes (HCC) 01/18/2016   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  Rationale for Evaluation and Treatment: Rehabilitation  SUBJECTIVE:   SUBJECTIVE STATEMENT: Pt reports mild back pain continues Pt accompanied by: self  PERTINENT HISTORY: Per chart 63 y.o. male presented to emergency department 04/12/23 with complaining of right-sided weakness; CTA head which showed left MCA territory infarction along with ICA severe stenosis as well as occlusion of the left vertebral artery. MRI multiple acute infarcts L MCA and PCA territories, old R frontal infarct PMH significant of CAD status post CABG, hypertension, insulin-dependent DM type II, hyperlipidemia, aspirin allergy, chronic alcohol use, and obesity   PRECAUTIONS: Fall   PAIN:  Are you having pain? Yes: NPRS scale: 5/10 Pain location: back Pain description: aching Aggravating factors: standing Relieving factors: rest, heat , OT will not address directly as pt is seeing PT for this  FALLS: Has patient fallen in last 6 months? No  LIVING ENVIRONMENT: Lives with: lives alone Lives in: House/apartment Stairs: No Has following equipment at home: Environmental consultant - 4 wheeled, Wheelchair (manual), and shower chair  PLOF: Independent  PATIENT GOALS: improve use of right hand   OBJECTIVE:  Note: Objective measures were completed at Evaluation unless otherwise noted.  HAND DOMINANCE: Right  ADLs: Overall ADLs: increased time required and difficulty using R dominant hand due to weakness and incoordination Transfers/ambulation related to ADLs: Eating: difficulty feeding self with RUE, and cutting food  Grooming: difficulty shaving pt cut himself UB Dressing: mod I LB Dressing: mod I Toileting: mod i Bathing: mod I  Tub Shower transfers: mod I Equipment: Shower seat with back  IADLs: Shopping: does light shopping  Meal Prep: using microwave  Community mobility: mod I Medication management: keeps  up with own meds Financial management: grandson helps Handwriting: Not legible  MOBILITY  STATUS: Independent       UPPER EXTREMITY ROM:    Active ROM Right eval Left eval  Shoulder flexion 125   Shoulder abduction    Shoulder adduction    Shoulder extension    Shoulder internal rotation    Shoulder external rotation    Elbow flexion    Elbow extension -10   Wrist flexion 63   Wrist extension 40   Wrist ulnar deviation    Wrist radial deviation    Wrist pronation    Wrist supination    (Blank rows = not tested)   HAND FUNCTION: Grip strength: Right: 40 lbs; Left: 75 lbs  COORDINATION: 9 Hole Peg test: Right: 1 min 45 sec; Left: 33.34 sec   SENSATION: Mild numbness in RUE  EDEMA: mild edema in RUE    COGNITION: Overall cognitive status:  to be further assessed in a functional context  VISION: Subjective report: Pt reports he got new glasses but they are not working as well. Baseline vision: Wears glasses for reading only   VISION ASSESSMENT: Visual Fields: per hospital notes right visual field deficits, per gross assessment no significant deficit noted will assess in functional context number cancellation: 2/44 missed Pt was cautioned to exercise caution and double check on the right side when driving, as pt reports MD cleared him to drive      OBSERVATIONS: Pleasant male with speech difficulties is agreeable to OT treatment   TODAY'S  TREATMENT:                                                                                                                              DATE: 05/09/23- Cane exercises for shoulder flexion, abduction and biceps curls 10-15 reps each, min v.c  Fucntional reaching mid range to place large pegs in vertical pegboard with RUE, mod v.c/ mod difficulty and drops Placing and removing graded clothespins from target with RUE for sustained pinch 1-8#, min v.c  Flipping and dealing playing cards with RUE for increased fine motor coordiantion, min v.c  UBE x 6 mins level 1 for conditioning  05/02/23 Pt was instructed in  initial HEP for cane exercises in sitting and exercises for wrist, forearm and hand and beginning coordination, min v.c and demonstration. Fit of prefab resting splint was assessed however since pt now has increased function in hand, therapist recommends pt does not wear splint so he can use hand. Arm bike x 6 mins level 1 for conditioning 04/26/23 -eval only  PATIENT EDUCATION: Education details: icane exercises, visual scanning/ compensations Person educated: Patient Education method: Verbal cues, demonstration, handout Education comprehension: verbalized understanding, returned demonstration  HOME EXERCISE PROGRAM: Cane exercises, beginning, coordination 05/02/23   GOALS: Goals reviewed with patient? Yes  SHORT TERM GOALS: Target date: 05/25/23  I with initial HEP  Goal status: I issued needs reinforcement 05/02/23  2.  Pt will demonstrate improved fine motor coordination as evidenced by decreasing 9 hole peg test score by 5 secs from initial. Baseline: 1 min 45 secs Goal status: INITIAL  3.  Pt will demonstrate understanding of edema control techniques and sensory precautions  Goal status: INITIAL  4.  Pt will demonstrate grossly 90% composite finger flexion with RUE for increased functional use. Baseline: 85% Goal status: INITIAL  5.  Pt will report consistently feeding himself with RUE.  Goal status: INITIAL  6.  I with with adaptive equipment and strategies to maximize pt's safety and I with ADLs/IADLS.  Goal status: INITIAL 7. Pt will perform environmental scanning with 90% or better accuracy. Goal status: initial  LONG TERM GOALS: Target date: 07/19/23  I with updated HEP. Baseline:  Goal status: INITIAL  2.  Pt will demonstrate improved fine motor coordination for ADLs as evidenced by decreasing 9 hole peg test score  to 90 secs Baseline: 1 min 45 secs Goal status: INITIAL  3.  Pt will increase RUE grip strength to 50 lbs for increased functional  use. Baseline: 40 lbs Goal status: INITIAL  4.  Pt will resume use of RUE as dominant hand 50% of the time for ADLS/light IADLs.  Goal status: INITIAL  5.  Pt will demonstrate ability to retrieve a 3 lbs object from overhead shelf without drops x 5 reps  Goal status: INITIAL  6.  Pt will demonstrate ability to carry an 8 lbs bag of groceries with RUE.  Goal status: INITIAL   ASSESSMENT:  CLINICAL IMPRESSION: Pt is progressing towards goals with improving RUE coordination. Pt's RUE fatigues quickly requiring  frequent rest breaks.    PERFORMANCE DEFICITS: in functional skills including ADLs, IADLs, coordination, dexterity, sensation, edema, ROM, strength, pain, flexibility, Fine motor control, Gross motor control, mobility, balance, endurance, decreased knowledge of precautions, decreased knowledge of use of DME, vision, and UE functional use, cognitive skills including attention, problem solving, safety awareness, thought, and understand, and psychosocial skills including coping strategies, environmental adaptation, habits, interpersonal interactions, and routines and behaviors.   IMPAIRMENTS: are limiting patient from ADLs, IADLs, rest and sleep, work, play, leisure, and social participation.   CO-MORBIDITIES: may have co-morbidities  that affects occupational performance. Patient will benefit from skilled OT to address above impairments and improve overall function.  MODIFICATION OR ASSISTANCE TO COMPLETE EVALUATION: No modification of tasks or assist necessary to complete an evaluation.  OT OCCUPATIONAL PROFILE AND HISTORY: Detailed assessment: Review of records and additional review of physical, cognitive, psychosocial history related to current functional performance.  CLINICAL DECISION MAKING: LOW - limited treatment options, no task modification necessary  REHAB POTENTIAL: Good  EVALUATION COMPLEXITY: Low    PLAN:  OT FREQUENCY: 1x/week plus  OT DURATION: 12 weeks  (however may wrap up dependent upon progress and insurance limitations)  PLANNED INTERVENTIONS: 16109 OT Re-evaluation, 97535 self care/ADL training, 60454 therapeutic exercise, 97530 therapeutic activity, 97112 neuromuscular re-education, 97140 manual therapy, 97113 aquatic therapy, 97035 ultrasound, 97018 paraffin, 09811 moist heat, 97010 cryotherapy, 97129 Cognitive training (first 15 min), 91478 Cognitive training(each additional 15 min), 29562 Splinting (initial encounter), passive range of motion, functional mobility training, visual/perceptual remediation/compensation, psychosocial skills training, energy conservation, coping strategies training, patient/family education, and DME and/or AE instructions  RECOMMENDED OTHER SERVICES: ST  CONSULTED AND AGREED WITH PLAN OF CARE: Patient  PLAN FOR NEXT SESSION: NMR, coordiantion, functional use of RUE   Lauris Keepers, OT 05/15/2023, 11:53 AM

## 2023-05-15 NOTE — Assessment & Plan Note (Signed)
Denies any acute ongoing symptoms of angina, chest pain or shortness of breath.  Has aspirin allergy and was reported to be placed on Plavix. He apparently was not taking this around the time of his stroke. Now he has been started on Brilinta with plan to continue this for 90 days and to be switched to Plavix tentatively in January.  He is aware of this plan and reemphasized the importance of sticking with antiplatelet therapy.  Continue with lipid-lowering therapy as above. He has been on metoprolol succinate 200 mg once daily since discharge and has been taking it consistently. Continue the same.

## 2023-05-15 NOTE — Patient Instructions (Signed)
Medication Instructions:  A refill for Dennis Hopkins was sent to your pharmacy.  *If you need a refill on your cardiac medications before your next appointment, please call your pharmacy*   Lab Work:  None ordered If you have labs (blood work) drawn today and your tests are completely normal, you will receive your results only by: MyChart Message (if you have MyChart) OR A paper copy in the mail If you have any lab test that is abnormal or we need to change your treatment, we will call you to review the results.   Testing/Procedures: Your physician has recommended that you wear an event monitor. Event monitors are medical devices that record the heart's electrical activity. Doctors most often [place] these monitors to diagnose arrhythmias. Arrhythmias are problems with the speed or rhythm of the heartbeat. The monitor is a small, portable device. You can wear one while you do your normal daily activities. This is usually used to diagnose what is causing palpitations/syncope (passing out). This is a 30 day monitor.    Follow-Up: At [facility], you and your health needs are our priority.  As part of our continuing mission to provide you with exceptional heart care, we have created designated Provider Care Teams.  These Care Teams include your primary Cardiologist (physician) and Advanced Practice Providers (APPs -  Physician Assistants and Nurse Practitioners) who all work together to provide you with the care you need, when you need it.  We recommend signing up for the patient portal called "MyChart".  Sign up information is provided on this After Visit Summary.  MyChart is used to connect with patients for Virtual Visits (Telemedicine).  Patients are able to view lab/test results, encounter notes, upcoming appointments, etc.  Non-urgent messages can be sent to your provider as well.   To learn more about what you can do with MyChart, go to [email or web address].    Your next appointment:   2  month(s)  Provider:   Huntley Dec, MD

## 2023-05-15 NOTE — Patient Instructions (Addendum)
Your Plan:  Continue Brilanta for 90 days. Call our office at the 90 day mark and we will switch to plavix  Blood pressure goal <130/90 Cholesterol LDL goal <70 Diabetes goal A1c <7 Monitor diet and try to exercise   Thank you for coming to see [place] at [facility]. I hope we have been able to provide you high quality care today.  You may receive a patient satisfaction survey over the next few weeks. We would appreciate your feedback and comments so that we may continue to improve ourselves and the health of our patients.

## 2023-05-15 NOTE — Assessment & Plan Note (Signed)
Advised to continue with the instructions and follow-up with neurologist as recommended. His stroke appears to be secondary to carotid artery disease and intracranial atherosclerosis.  From cardiac standpoint there is no evidence for atrial level shunt based on bubble study on transthoracic echocardiogram during inpatient stay. LV function is normal.  Evaluation for atrial fibrillation has not been completed.  He does have risk factors. For comprehensive assessment will proceed with a 30-day event monitor study to rule out any underlying atrial fibrillation.

## 2023-05-15 NOTE — Assessment & Plan Note (Signed)
His recent lipid panel was not at goal with LDL 162, total cholesterol 221, HDL 38, triglycerides 107 on 04/13/2023. Continue Crestor 40 mg once a day. He is unsure if Zetia is on his medication list. Will send out a prescription for Zetia 10 mg once a day he is agreeable to start this if it is not present on his home list of medicines.

## 2023-05-15 NOTE — Progress Notes (Signed)
Cardiology Consultation:    Date:  05/15/2023   ID:  Dennis Hopkins, DOB December 07, 1960, MRN [id number]  PCP:  Zola Button, Grayling Congress, DO  Cardiologist:  Luretha Murphy, MD   Referring MD: Zola Button, Grayling Congress, *   No chief complaint on file.    ASSESSMENT AND PLAN:   Dennis Hopkins 62 year old male with history of CAD s/p CABG 2017 [LIMA-LAD, SVG-OM1, SVG-RPDA], carotid artery disease, mild ascending aorta dilation, hypertension, type 2 diabetes mellitus insulin-dependent hyperlipidemia, aspirin allergy, chronic alcohol abuse, obesity, nonadherence to therapy, with recent acute right side hemiparesis associated with facial droop and dysarthria in the setting of acute left MCA and PCA territory stroke in the setting of severe carotid artery stenosis and intracranial atherosclerosis, with residual right side weakness and facial droop with dysarthria.  Here for follow-up with cardiology.  Problem List Items Addressed This Visit     Hyperlipidemia LDL goal <70 - Primary    His recent lipid panel was not at goal with LDL 162, total cholesterol 221, HDL 38, triglycerides 107 on 04/13/2023. Continue Crestor 40 mg once a day. He is unsure if Zetia is on his medication list. Will send out a prescription for Zetia 10 mg once a day he is agreeable to start this if it is not present on his home list of medicines.      Relevant Medications   furosemide (LASIX) 20 MG tablet   ezetimibe (ZETIA) 10 MG tablet   CAD, s/p CABG in 2017 [LIMA-LAD, SVG-OM1, SVG-RPDA]    Denies any acute ongoing symptoms of angina, chest pain or shortness of breath.  Has aspirin allergy and was reported to be placed on Plavix. He apparently was not taking this around the time of his stroke. Now he has been started on Brilinta with plan to continue this for 90 days and to be switched to Plavix tentatively in January.  He is aware of this plan and reemphasized the importance of sticking with antiplatelet  therapy.  Continue with lipid-lowering therapy as above. He has been on metoprolol succinate 200 mg once daily since discharge and has been taking it consistently. Continue the same.       Relevant Medications   furosemide (LASIX) 20 MG tablet   ezetimibe (ZETIA) 10 MG tablet   Ischemic cerebrovascular accident (CVA) (HCC)    Advised to continue with the instructions and follow-up with neurologist as recommended. His stroke appears to be secondary to carotid artery disease and intracranial atherosclerosis.  From cardiac standpoint there is no evidence for atrial level shunt based on bubble study on transthoracic echocardiogram during inpatient stay. LV function is normal.  Evaluation for atrial fibrillation has not been completed.  He does have risk factors. For comprehensive assessment will proceed with a 30-day event monitor study to rule out any underlying atrial fibrillation.        Relevant Medications   furosemide (LASIX) 20 MG tablet   ezetimibe (ZETIA) 10 MG tablet   Other Relevant Orders   Cardiac event monitor   Other Visit Diagnoses     Palpitations       Relevant Orders   Cardiac event monitor      Will evaluate in clinic in 2 months tentatively to follow-up on results on event monitor.   History of Present Illness:    Dennis Hopkins is a 62 y.o. male who is being seen today for t follow-up visit. PCP is Zola Button, Grayling Congress, *. Last visit with  our office was with Dr. Dulce Sellar September 14, 2020. Follow-up with neurologist was today at [facility], primary neurologist is Dr. Pearlean Brownie and seen by Butch Penny NP today 01 Has history of CAD s/p CABG 2017 [LIMA-LAD, SVG-OM 1, SVG-RPDA, carotid artery disease, mild ascending aorta dilatation,, hypertension, insulin-dependent type 2 diabetes mellitus, hyperlipidemia, aspirin allergy, chronic alcohol use, obesity, nonadherent to therapy, recently had an acute left MCA and PCA territory stroke with  right hemiparesis and facial droop on 05/04/2023, presented to the hospital on 04-12-2023 in the setting of severe carotid artery stenosis was and intracranial atherosclerosis and discharged on Brilinta to be continued for 90 days subsequently to be switched to Plavix.  Discharged on October 26  Pleasant gentleman here for the visit by himself.  Mentions lives by himself.  His wife recently passed away.  On 11-14-24with symptoms of right-sided weakness and speech difficulty he went to bed and in the morning symptoms persisted and he took himself to the ER.  Was diagnosed with multifocal infarct involving left cerebral hemispheres in the setting of being nonadherent with antiplatelet therapy and diabetic therapy at baseline.  Since discharge has been participating in outpatient rehab.  His strength right upper and lower extremity weakness is improved, his speech is improving but still has dysarthria.  Has mild right-sided facial droop.  Denies any chest pain, shortness of breath, orthopnea.  Denies any lightheadedness or dizziness or syncopal episodes.  Occasional skipped beat reported. Denies any blood in urine or stool since hospital discharge.  Mentions has been taking his medications consistently. Reviewed the medications he currently has. He recognizes Brilinta and reports he has 90-day supply and is aware to switch her to Plavix tentatively in January. He also recognizes rosuvastatin 40 mg that he is taking [2 pills of 20 mg] He does not identify Zetia which is on his medication list. He is also taking metoprolol XL 200 mg once daily. He does not recognize if he is taking He mentions he has not been taking furosemide at home.  Recent EKG available to review is from 04/12/2023 sinus tachycardia heart rate 110/min, PR interval 178 ms.  Echocardiogram April 14, 2023 noted LVEF 75%, normal RV function, mildly dilated left atrium, negative bubble study.  TEE was recommended further  evaluation for intracardiac causes of embolization.  CT and MRI imaging during inpatient stay 2023/05/04 CT of the head and neck noted severe stenosis of the intra cranial arteries and internal carotid arteries. CT head and MRI head confirmed multiple acute infarcts within the left hemisphere affecting MCA and PCA territories. Past Medical History:  Diagnosis Date   Acute CVA (cerebrovascular accident) (HCC) 04/12/2023   Acute left-sided low back pain with right-sided sciatica 02/14/2019   Calcified granuloma of lung 09/06/2021   Chest pain 11/11/2015   Chickenpox    Chronic alcohol use 04/12/2023   Coronary artery disease 01/12/2016   Diabetes (HCC) 01/18/2016   Type 2     Diabetes mellitus (HCC) 04/13/2021   Eczema 04/12/2022   Essential hypertension    Exertional angina (HCC)    History of CAD (coronary artery disease) 04/12/2023   HTN (hypertension) 10/20/2015   Hyperlipemia    Hyperlipidemia    Hyperlipidemia LDL goal <70 10/20/2015   Hypokalemia 04/12/2023   Insulin dependent type 2 diabetes mellitus (HCC) 07/19/2016   Kidney stones 06/21/2011   Lumbar foraminal stenosis 01/05/2022   Morbid obesity (HCC) 04/12/2023   Obesity (BMI 30-39.9) 09/26/2013   Peripheral  neuropathic pain 09/06/2021   Peripheral neuropathy 04/12/2023   Preventative health care 09/29/2014   Rash 08/24/2020   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   Snoring 08/24/2020   Tobacco use disorder 09/26/2013   Type 2 diabetes mellitus with diabetic peripheral angiopathy without gangrene, without long-term current use of insulin (HCC) 09/06/2021   Type 2 diabetes mellitus with hyperglycemia, without long-term current use of insulin (HCC) 04/13/2021   Urinary frequency 08/24/2020    Past Surgical History:  Procedure Laterality Date   CARDIAC CATHETERIZATION N/A 01/07/2016   Procedure: Left Heart Cath and Coronary Angiography;  Surgeon: Corky Crafts, MD;  Location: [facility] INV[facility] CV LAB;  Service:  Cardiovascular;  Laterality: N/A;   CORONARY ARTERY BYPASS GRAFT N/A 01/13/2016   Procedure: CORONARY ARTERY BYPASS GRAFTING (CABG) x4 Endoscopic Harvesting of the Right Greater Saphenous Vein;  Surgeon: Loreli Slot, MD;  Location: [facility] OR;  Service: Open Heart Surgery;  Laterality: N/A;   TEE WITHOUT CARDIOVERSION N/A 01/13/2016   Procedure: TRANSESOPHAGEAL ECHOCARDIOGRAM (TEE);  Surgeon: Loreli Slot, MD;  Location: [facility] OR;  Service: Open Heart Surgery;  Laterality: N/A;   TONSILLECTOMY      Current Medications: Current Meds  Medication Sig   Continuous Glucose Receiver (DEXCOM G7 RECEIVER) DEVI As directed   Continuous Glucose Sensor (DEXCOM G7 SENSOR) MISC Use to check blood glucose at least 4 times daily. Change sensor every 10 days.   dapagliflozin propanediol (FARXIGA) 10 MG TABS tablet Take 1 tablet (10 mg total) by mouth daily before breakfast.   folic acid (FOLVITE) 1 MG tablet Take 1 tablet (1 mg total) by mouth daily.   furosemide (LASIX) 20 MG tablet Take 20 mg by mouth daily.   gabapentin (NEURONTIN) 100 MG capsule TAKE 1 TO 3 CAPSULES BY MOUTH THREE TIMES DAILY   HUMALOG KWIKPEN 100 UNIT/ML KwikPen INJECT 8 TO 18 UNITS SUBCUTANEOUSLY THREE TIMES DAILY   Insulin Pen Needle 32G X 4 MM MISC 1 Device by Does not apply route in the morning, at noon, in the evening, and at bedtime.   LANTUS SOLOSTAR 100 UNIT/ML Solostar Pen Inject 40 Units into the skin daily.   methocarbamol (ROBAXIN) 500 MG tablet Take 1 tablet (500 mg total) by mouth 4 (four) times daily.   metoprolol (TOPROL-XL) 200 MG 24 hr tablet Take 1 tablet (200 mg total) by mouth daily.   rosuvastatin (CRESTOR) 20 MG tablet Take 2 tablets (40 mg total) by mouth daily. **NOTE DOSE 40 MG NOT IN STOCK**   thiamine (VITAMIN B1) 100 MG tablet Take 1 tablet (100 mg total) by mouth daily.   ticagrelor (BRILINTA) 90 MG TABS tablet Take 1 tablet (90 mg total) by mouth 2 (two) times daily.   traMADol (ULTRAM) 50 MG  tablet Take 1 tablet (50 mg total) by mouth every 6 (six) hours as needed for moderate pain (pain score 4-6).   [DISCONTINUED] ezetimibe (ZETIA) 10 MG tablet Take 1 tablet (10 mg total) by mouth daily.     Allergies:   Asa [aspirin]   Social History   Socioeconomic History   Marital status: Widowed    Spouse name: Not on file   Number of children: 1   Years of education: Not on file   Highest education level: Not on file  Occupational History   Not on file  Tobacco Use   Smoking status: Former    Current packs/day: 0.00    Average packs/day: 2.0 packs/day for 35.0 years (70.0 ttl pk-yrs)  Types: Cigarettes    Start date: 12/22/1978    Quit date: 12/21/2013    Years since quitting: 9.4   Smokeless tobacco: Never  Vaping Use   Vaping status: Never Used  Substance and Sexual Activity   Alcohol use: Yes    Alcohol/week: 0.0 standard drinks of alcohol    Comment: 1-2 drinks per night- wife reports he drinks more    Drug use: No   Sexual activity: Yes    Partners: Female  Other Topics Concern   Not on file  Social History Narrative   Not on file   Social Determinants of Health   Financial Resource Strain: Low Risk  (11/22/2021)   Overall Financial Resource Strain (CARDIA)    Difficulty of Paying Living Expenses: Not very hard  Food Insecurity: Not on file  Transportation Needs: Not on file  Physical Activity: Not on file  Stress: Not on file  Social Connections: Not on file     Family History: The patient's family history includes Arthritis in his father and mother; Diabetes in his brother, father, and mother; Hyperlipidemia in his father. ROS:   Please see the history of present illness.    All 14 point review of systems negative except as described per history of present illness.  EKGs/Labs/Other Studies Reviewed:    The following studies were reviewed today:   EKG:       Recent Labs: 04/15/2023: Magnesium 1.9 04/20/2023: ALT 39; BUN 21; Creatinine, Ser 1.48;  Hemoglobin 16.0; Platelets 242.0; Potassium 4.2; Sodium 139  Recent Lipid Panel    Component Value Date/Time   CHOL 221 (H) 04/13/2023 0620   TRIG 107 04/13/2023 0620   HDL 38 (L) 04/13/2023 0620   CHOLHDL 5.8 04/13/2023 0620   VLDL 21 04/13/2023 0620   LDLCALC 162 (H) 04/13/2023 0620   LDLCALC 128 (H) 02/17/2023 1344   LDLDIRECT 173.0 02/07/2022 1440    Physical Exam:    VS:  BP 118/68   Pulse 62   Ht 5\' 10"  (1.778 m)   Wt 222 lb (100.7 kg)   SpO2 98%   BMI 31.85 kg/m     Wt Readings from Last 3 Encounters:  05/15/23 222 lb (100.7 kg)  05/15/23 220 lb 8 oz (100 kg)  04/20/23 223 lb 12.8 oz (101.5 kg)     GENERAL:  Well nourished, well developed in no acute distress. Right-sided facial droop observed. NECK: No JVD; No carotid bruits CARDIAC: RRR, S1 and S2 present, no murmurs, no rubs, no gallops CHEST:  Clear to auscultation without rales, wheezing or rhonchi  Extremities: No pitting pedal edema. Pulses bilaterally symmetric with radial 2+ and dorsalis pedis 2+.  Mild right upper extremity weakness in comparison to left. NEUROLOGIC:  Alert and oriented x 3  Medication Adjustments/Labs and Tests Ordered: Current medicines are reviewed at length with the patient today.  Concerns regarding medicines are outlined above.  Orders Placed This Encounter  Procedures   Cardiac event monitor   Meds ordered this encounter  Medications   ezetimibe (ZETIA) 10 MG tablet    Sig: Take 1 tablet (10 mg total) by mouth daily.    Dispense:  90 tablet    Refill:  2    Signed, Sharnese Heath reddy Qusai Kem, MD, MPH, [facility]. 05/15/2023 1:46 PM    [facility]

## 2023-05-16 ENCOUNTER — Ambulatory Visit: Payer: PRIVATE HEALTH INSURANCE | Admitting: Physical Therapy

## 2023-05-16 ENCOUNTER — Ambulatory Visit: Payer: PRIVATE HEALTH INSURANCE | Admitting: Occupational Therapy

## 2023-05-16 ENCOUNTER — Encounter: Payer: Self-pay | Admitting: Speech Pathology

## 2023-05-16 ENCOUNTER — Encounter: Payer: Self-pay | Admitting: Physical Therapy

## 2023-05-16 ENCOUNTER — Ambulatory Visit: Payer: PRIVATE HEALTH INSURANCE | Admitting: Speech Pathology

## 2023-05-16 ENCOUNTER — Encounter: Payer: Self-pay | Admitting: Occupational Therapy

## 2023-05-16 DIAGNOSIS — R2689 Other abnormalities of gait and mobility: Secondary | ICD-10-CM

## 2023-05-16 DIAGNOSIS — M6281 Muscle weakness (generalized): Secondary | ICD-10-CM | POA: Diagnosis not present

## 2023-05-16 DIAGNOSIS — R293 Abnormal posture: Secondary | ICD-10-CM

## 2023-05-16 DIAGNOSIS — R278 Other lack of coordination: Secondary | ICD-10-CM

## 2023-05-16 DIAGNOSIS — M5416 Radiculopathy, lumbar region: Secondary | ICD-10-CM

## 2023-05-16 DIAGNOSIS — R471 Dysarthria and anarthria: Secondary | ICD-10-CM

## 2023-05-16 DIAGNOSIS — R41842 Visuospatial deficit: Secondary | ICD-10-CM

## 2023-05-16 DIAGNOSIS — G8929 Other chronic pain: Secondary | ICD-10-CM

## 2023-05-16 DIAGNOSIS — R41841 Cognitive communication deficit: Secondary | ICD-10-CM

## 2023-05-16 NOTE — Therapy (Signed)
OUTPATIENT PHYSICAL THERAPY  TREATMENT/PROGRESS NOTE AND RECERT    Patient Name: Dennis Hopkins MRN: [id number] DOB:[birth year], 62 y.o., male Today's Date: 05/16/2023  END OF SESSION:  PT End of Session - 05/16/23 0953     Visit Number 7    Number of Visits 13    Date for PT Re-Evaluation 06/27/23    Authorization Type [employer]    PT Start Time 0931    PT Stop Time 1011    PT Time Calculation (min) 40 min    Activity Tolerance Patient tolerated treatment well    Behavior During Therapy [facility] for tasks assessed/performed                    Past Medical History:  Diagnosis Date   Acute CVA (cerebrovascular accident) (HCC) 04/12/2023   Acute left-sided low back pain with right-sided sciatica 02/14/2019   Calcified granuloma of lung 09/06/2021   Chest pain 11/11/2015   Chickenpox    Chronic alcohol use 04/12/2023   Coronary artery disease 01/12/2016   Diabetes (HCC) 01/18/2016   Type 2     Diabetes mellitus (HCC) 04/13/2021   Eczema 04/12/2022   Essential hypertension    Exertional angina (HCC)    History of CAD (coronary artery disease) 04/12/2023   HTN (hypertension) 10/20/2015   Hyperlipemia    Hyperlipidemia    Hyperlipidemia LDL goal <70 10/20/2015   Hypokalemia 04/12/2023   Insulin dependent type 2 diabetes mellitus (HCC) 07/19/2016   Kidney stones 06/21/2011   Lumbar foraminal stenosis 01/05/2022   Morbid obesity (HCC) 04/12/2023   Obesity (BMI 30-39.9) 09/26/2013   Peripheral neuropathic pain 09/06/2021   Peripheral neuropathy 04/12/2023   Preventative health care 09/29/2014   Rash 08/24/2020   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   Snoring 08/24/2020   Tobacco use disorder 09/26/2013   Type 2 diabetes mellitus with diabetic peripheral angiopathy without gangrene, without long-term current use of insulin (HCC) 09/06/2021   Type 2 diabetes mellitus with hyperglycemia, without long-term current use of insulin (HCC) 04/13/2021   Urinary frequency 08/24/2020    Past Surgical History:  Procedure Laterality Date   CARDIAC CATHETERIZATION N/A 01/07/2016   Procedure: Left Heart Cath and Coronary Angiography;  Surgeon: Corky Crafts, MD;  Location: [facility] INVASIVE CV LAB;  Service: Cardiovascular;  Laterality: N/A;   CORONARY ARTERY BYPASS GRAFT N/A 01/13/2016   Procedure: CORONARY ARTERY BYPASS GRAFTING (CABG) x4 Endoscopic Harvesting of the Right Greater Saphenous Vein;  Surgeon: Loreli Slot, MD;  Location: [facility] OR;  Service: Open Heart Surgery;  Laterality: N/A;   TEE WITHOUT CARDIOVERSION N/A 01/13/2016   Procedure: TRANSESOPHAGEAL ECHOCARDIOGRAM (TEE);  Surgeon: Loreli Slot, MD;  Location: [facility] OR;  Service: Open Heart Surgery;  Laterality: N/A;   TONSILLECTOMY     Patient Active Problem List   Diagnosis Date Noted   Hyperlipemia    Ischemic cerebrovascular accident (CVA) (HCC) 04/12/2023   History of CAD (coronary artery disease) 04/12/2023   Chronic alcohol use 04/12/2023   Hypokalemia 04/12/2023   Morbid obesity (HCC) 04/12/2023   Peripheral neuropathy 04/12/2023   Eczema 04/12/2022   Lumbar foraminal stenosis 01/05/2022   Type 2 diabetes mellitus with diabetic peripheral angiopathy without gangrene, without long-term current use of insulin (HCC) 09/06/2021   Calcified granuloma of lung 09/06/2021   Peripheral neuropathic pain 09/06/2021   Type 2 diabetes mellitus with hyperglycemia, without long-term current use of insulin (HCC) 04/13/2021   Diabetes mellitus (HCC) 04/13/2021   Essential hypertension  Hyperlipidemia    Chickenpox    Urinary frequency 08/24/2020   Snoring 08/24/2020   Rash 08/24/2020   Acute left-sided low back pain with right-sided sciatica 02/14/2019   Insulin dependent type 2 diabetes mellitus (HCC) 07/19/2016   Diabetes (HCC) 01/18/2016   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   CAD, s/p CABG in 2017 [LIMA-LAD, SVG-OM1, SVG-RPDA] 01/12/2016   Exertional angina (HCC)    Chest pain 11/11/2015   HTN  (hypertension) 10/20/2015   Hyperlipidemia LDL goal <70 10/20/2015   Preventative health care 09/29/2014   Obesity (BMI 30-39.9) 09/26/2013   Tobacco use disorder 09/26/2013    PCP: Donato Schultz, DO   REFERRING PROVIDER: Donato Schultz, DO   REFERRING DIAG: [phone] (ICD-10-CM) - Lumbar foraminal stenosis   THERAPY DIAG:  Abnormal posture  Muscle weakness (generalized)  Other lack of coordination  Chronic bilateral low back pain with left-sided sciatica  Radiculopathy, lumbar region  RATIONALE FOR EVALUATION AND TREATMENT: Rehabilitation  ONSET DATE: ~1 year   NEXT MD VISIT: PRN - none scheduled   SUBJECTIVE:                                                                                                                                                                                                         SUBJECTIVE STATEMENT:  Back is hurting today, nothing new . Pain is higher today, I wonder if its from my posture but most of the time I sit with support.    PAIN: Are you having pain? Yes: NPRS scale: 7/10 Pain location: B low back, L>R Pain description: sharp Aggravating factors: sitting in unsupported position Relieving factors: heat, back support, meds   PERTINENT HISTORY:  HTN; DM-II; CA s/p CABG x 4 - 2017; Peripheral neuropathic pain; Obesity  PRECAUTIONS: None  RED FLAGS: None  WEIGHT BEARING RESTRICTIONS: No  FALLS:  Has patient fallen in last 6 months? 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   Alignment:  Facet mediated anterolisthesis of L5 on S1 of 3 mm.   Vertebrae: No fracture, suspicious marrow lesion, or significant marrow edema. Hemangioma in the L5  vertebral body. Small L5 superior endplate Schmorl's node. Multilevel Modic type 2 endplate changes.   Conus medullaris and cauda equina: Conus extends to the L1 level. Conus and cauda equina appear normal.   Paraspinal and other soft tissues: Unremarkable.   Disc levels:   Mild diffuse lumbar disc desiccation. Moderate disc space narrowing at L3-4 and L4-5.   T12-L1: Mild disc bulging without stenosis.   L1-2: Disc bulging without stenosis.   L2-3: Disc bulging and mild facet hypertrophy without stenosis.   L3-4: Disc bulging results in borderline to mild bilateral lateral recess stenosis and mild-to-moderate bilateral neural foraminal stenosis without significant spinal stenosis.   L4-5: Disc bulging results in borderline to mild bilateral lateral recess stenosis and moderate bilateral neural foraminal stenosis without significant spinal stenosis.   L5-S1: Anterolisthesis with bulging uncovered disc and moderate right and severe left facet hypertrophy result in moderate bilateral neural foraminal stenosis without spinal stenosis.   IMPRESSION: 1. Multilevel lumbar disc and facet degeneration with moderate neural foraminal stenosis at L4-5 and L5-S1. 2. No significant spinal stenosis.  11/02/21 - Lumbar spine x-ray: FINDINGS: There is no evidence for lumbar spine fracture. Spinal alignment is normal. There is mild-to-moderate disc space narrowing at L3-L4 and L4-L5, mildly progressed. Mild disc space narrowing at L5-S1 appears stable. Endplate osteophytes are seen throughout. There is facet arthropathy at L5-S1. Soft tissues are within normal limits.   IMPRESSION: 1. No acute fracture or malalignment. 2. Moderate multilevel degenerative changes throughout the spine have  mildly progressed compared to 2020.  PATIENT SURVEYS:  Modified Oswestry 18 / 50 = 36.0 %; 05/16/23 22/50  FOTO lumbar spine 52, predicted 60  SCREENING FOR RED FLAGS: Bowel or bladder incontinence: No Spinal tumors: No Cauda equina syndrome: No Compression fracture: No Abdominal aneurysm: No  COGNITION:  Overall cognitive status: Within functional limits for tasks assessed    SENSATION: Radicular pain down L>R LE to calves B diabetic peripheral neuropathy in B feet  MUSCLE LENGTH: Hamstrings: mod/severe tight L>R ITB: mod/severe tight L>R Piriformis: mod/severe tight L>R Hip flexors: mod tight B Quads: mod tight B Heelcord: NT  POSTURE:  rounded shoulders, forward head, decreased lumbar lordosis, and flexed trunk   PALPATION: Increased muscle tension in bilateral lumbar paraspinals and upper glutes, however patient denies TTP; 05/16/23 tight and sore lumbar region   LUMBAR ROM:   Active  03/28/23 - Eval 05/16/23  Flexion Hands to ankles - HS tightness Mild limitation, HS tightness   Extension 25% limited Mild limitation   Right lateral flexion Hand to fibular head ^ WNL   Left lateral flexion Hand to lateral calf WNL   Right rotation 10% limited   Left rotation 40% limited   (Blank rows = not tested, ^ = increased pain)  LOWER EXTREMITY ROM:    Limited B hip extension and ER   LOWER EXTREMITY MMT:    MMT Right eval Left eval R 04/20/23 L 04/20/23 R 05/16/23 L 05/16/23  Hip flexion 5 4 5  4+    Hip extension 4 4+ 4+ 4+    Hip abduction 4 4 4+ 4+ 5 5  Hip adduction 4+ 4+ 4+ 4+    Hip internal rotation 5 5 5 5     Hip external rotation 5 5 5 5     Knee flexion 5 5 5 5     Knee extension 5 5 5 5     Ankle dorsiflexion 5 4+ 5 5    Ankle plantarflexion 5 5 5  5  Ankle inversion        Ankle eversion         (Blank rows = not tested)  LUMBAR SPECIAL TESTS:  Straight leg raise test: Positive and Slump test: Positive - bilateral  GAIT: Distance walked: clinic  distances Assistive device utilized: None Level of assistance: Complete Independence Gait pattern: step through pattern and trunk flexed   TODAY'S TREATMENT:    05/16/23  Modified Oswestry, objective testing and care planning  TherEx  On MHP for pain control lumbar spine x12 minutes:  HS stretches 2x30 seconds B Piriformis stretches 2x30 seconds B  SKTC 5x10 seconds B    QL stretch 3 way x30 seconds each  Nustep L4--5 x6 minutes BLEs  Standing lumbar extensions x10 some improvement in pain    05/09/23 Bike L3 x81mins  Seated row 35# 2x10 Lat pull down 35# 2x10 Shoulder ext 10# 2x10 Leg ext 10# 2x10 HS curls 25# 2x10 Leg press 20# 2x10 STS with OHP 2x10 Supine passive stretching HS, SKTC, DKTC, trunk rotations, figure 4     04/26/23 NuStep L5x33mins  STS 2x10 Supine stretching HS, single knee to chest  Feet on ball rotations, small bridges, knees to chest x10 blackTB extension 2x10 Seated row 25# 2x10 Shoulder extension 5# x10, 10# x10    04/20/23  Re-Eval - post-hospitalization for acute CVA LE MMT unchanged or improved  Gait pattern unchanged with no LOB Review of acute care PT/OT/SLP assessment - no indication for f/u PT related to CVA, but OP OT and SLP recommended  THERAPEUTIC EXERCISE: to improve flexibility, strength and mobility.  Demonstration, verbal and tactile cues throughout for technique. Rec Bike - L2 x 6 min Standing TrA + GTB scap retraction + row 10 x 5" Standing TrA + GTB scap retraction + B shoulder extension 10 x 5" Quadruped over peanut ball: Alt hip extension 10 x 3" Alt UE raise 10 x 3" Prone press-up 10 x 3" Bird dog opp UE/LE raise 10 x 3" Quadruped cat/cow (w/o peanut ball) x 10   04/10/23  THERAPEUTIC EXERCISE: to improve flexibility, strength and mobility.  Demonstration, verbal and tactile cues throughout for technique. Rec Bike - L2 x 6 min POE x 1 min POE + alt fwd/OH reach x 10 Quadruped cat/cow x 10 Quad rocking  from child's pose to prone press-up x 10  MANUAL THERAPY: To promote normalized muscle tension, improved flexibility, and reduced pain. Skilled palpation and monitoring of soft tissue during DN Trigger Point Dry-Needling  Treatment instructions: Expect mild to moderate muscle soreness. Patient verbalized understanding of these instructions and education. Patient Consent Given: Yes Education handout provided: Yes Muscles treated: B QL, lumbar erector spinae and multifidi Electrical stimulation performed: No Parameters: N/A Treatment response/outcome: Twitch Response Elicited and Palpable Increase in Muscle Length STM/DTM, manual TPR and pin & stretch to muscles addressed with DN   04/04/23 Manual Therapy: to decrease muscle spasm, pain and improve mobility.  STM to bil thoraco-lumbar PS, glutes, QL and IASTM with foam roll  Therapeutic Exercise: to improve strength and mobility.  Demo, verbal and tactile cues throughout for technique.  SKTC stretch x 30" bil LTR 3x10" both ways Brief review of remainder of HEP Hooklying pelvic tils 10x3" Hooklying marches with core brace x 10 bil Bridges x 10 with TrA   03/28/23 - Eval  THERAPEUTIC EXERCISE: to improve flexibility, strength and mobility.  Demonstration, verbal and tactile cues throughout for technique. SKTC stretch 2 x 30" Hooklying B LTR 2  x 10" POE x 1 min Prone press up x 3 Demonstrated lumbar extension options in standing but not attempted   PATIENT EDUCATION:  Education details: HEP progression - quadruped cat/cow, continue with current HEP, postural awareness, options for OP OT & SLP at [facility] at [facility], and need for D/C from OP PT if he were to start [facility] services Person educated: Patient Education method: Explanation, Demonstration, and Handouts Education comprehension: verbalized understanding and needs further education  HOME EXERCISE PROGRAM: Access Code: [email or web address] URL:  [email or web address] Date: 04/20/2023 Prepared by: Glenetta Hew  Exercises - Supine Single Knee to Chest Stretch  - 2-3 x daily - 7 x weekly - 3 reps - 30 sec hold - Supine Lower Trunk Rotation  - 2-3 x daily - 7 x weekly - 5 reps - 10 sec hold - Static Prone on Elbows  - 2-3 x daily - 7 x weekly - 2 sets - 3 reps - 30 sec hold - Standing Lumbar Extension with Counter  - 2-3 x daily - 7 x weekly - 2 sets - 10 reps - 3 sec hold - Standing Lumbar Extension at Wall - Forearms  - 2-3 x daily - 7 x weekly - 2 sets - 10 reps - 3 sec hold - Supine Posterior Pelvic Tilt  - 2-3 x daily - 7 x weekly - 2 sets - 10 reps - Supine March  - 1 x daily - 7 x weekly - 2-3 sets - 10 reps - Supine Bridge  - 1 x daily - 7 x weekly - 2-3 sets - 10 reps - Cat Cow  - 1 x daily - 7 x weekly - 2 sets - 10 reps - 5 sec hold   ASSESSMENT:  CLINICAL IMPRESSION:  Performed formal re-cert/reassess today (no objectives taken at last visit). Pt continues to have significant back pain and continues to demonstrate objective impairments as above. Of note, POC was affected by acute CVA which occurred on 04/11/23 and was treated acutely in the hospital. He does demonstrate apparent extension preference as well as core weakness and hip immobility. Will benefit from continuation of skilled PT services especially as he is hopeful to attempt to return to work after the new year.   OBJECTIVE IMPAIRMENTS: Abnormal gait, decreased activity tolerance, decreased endurance, decreased knowledge of condition, decreased mobility, difficulty walking, decreased ROM, decreased strength, hypomobility, increased fascial restrictions, impaired perceived functional ability, increased muscle spasms, impaired flexibility, impaired sensation, improper body mechanics, postural dysfunction, and pain.   ACTIVITY LIMITATIONS: carrying, lifting, bending, sitting, standing, squatting, sleeping, stairs, transfers, bathing, dressing, reach over  head, locomotion level, and caring for others  PARTICIPATION LIMITATIONS: meal prep, cleaning, laundry, driving, shopping, community activity, occupation, and yard work  PERSONAL FACTORS: Fitness, Past/current experiences, Profession, Time since onset of injury/illness/exacerbation, and 3+ comorbidities: HTN; DM-II; CA s/p CABG x 4 - 2017; Peripheral neuropathic pain; Obesity  are also affecting patient's functional outcome.   REHAB POTENTIAL: Good  CLINICAL DECISION MAKING: Evolving/moderate complexity  EVALUATION COMPLEXITY: Moderate   GOALS: Goals reviewed with patient? Yes  SHORT TERM GOALS: Target date: 06/06/2023     Patient will be independent with initial HEP to improve outcomes and carryover.  Baseline: Initial HEP provided on eval Goal status: IN PROGRESS 05/16/23  2.  Patient will report centralization of radicular symptoms.  Baseline: L>R LE radicular pain down to calf Goal status: IN PROGRESS 05/16/23  LONG TERM GOALS: Target date: 06/27/2023     Patient will  be independent with ongoing/advanced HEP for self-management at home.  Baseline:  Goal status: IN PROGRESS 05/16/23  2.  Patient will report 50-75% improvement in low back pain and L LE radicular pain to improve QOL.  Baseline: 5/10, up to 9/10 with L>R LE radicular pain Goal status: IN PROGRESS 05/16/23  3.  Patient to demonstrate ability to achieve and maintain good spinal alignment/posturing and body mechanics needed for daily activities. Baseline: Flexed trunk posture with rounded shoulders and forward head Goal status: IN PROGRESS 05/16/23  4.  Patient will demonstrate functional pain free lumbar ROM to perform ADLs.   Baseline: Refer to above lumbar ROM table Goal status: IN PROGRESS 05/16/23  5.  Patient will demonstrate improved B LE strength to >/= 4+/5 for improved stability and ease of mobility . Baseline: Refer to above MMT table Goal status: MET 05/16/23  6.  Patient will report 60 on  lumbar FOTO to demonstrate improved functional ability.  Baseline: 52 Goal status: IN PROGRESS  05/16/23  7. Patient will report </= 24% on Modified Oswestry to demonstrate improved functional ability with decreased pain interference. Baseline: 18 / 50 = 36.0 %  Goal status: IN PROGRESS 05/16/23  8.  Patient will report ability to resume walking for wellness program at work without limitation due to LBP or L LE radiculopathy. Baseline: Patient has been unable to walk for wellness program due to pain Goal status: IN PROGRESS 05/16/23   PLAN:  PT FREQUENCY: 1x/week   PT DURATION: 6 weeks  PLANNED INTERVENTIONS: Therapeutic exercises, Therapeutic activity, Neuromuscular re-education, Balance training, Gait training, Patient/Family education, Self Care, Joint mobilization, Stair training, Aquatic Therapy, Dry Needling, Electrical stimulation, Spinal manipulation, Spinal mobilization, Cryotherapy, Moist heat, Taping, Traction, Ultrasound, Ionotophoresis 4mg /ml Dexamethasone, Manual therapy, and Re-evaluation  PLAN FOR NEXT SESSION: progress lumbopelvic flexibility and strengthening with extension preference, leg machine. Modalities PRN   Nedra Hai, PT, DPT 05/16/23 10:35 AM

## 2023-05-16 NOTE — Therapy (Signed)
OUTPATIENT SPEECH LANGUAGE PATHOLOGY TREATMENT    Patient Name: Dennis Hopkins MRN: [id number] DOB:09-Feb-1961, 62 y.o., male Today's Date: 05/16/2023  PCP: Donato Schultz, DO REFERRING PROVIDER: Donato Schultz, DO  END OF SESSION:  End of Session - 05/16/23 1106     Visit Number 3    Number of Visits 17    Date for SLP Re-Evaluation 07/03/23    SLP Start Time 1103    SLP Stop Time  1142    SLP Time Calculation (min) 39 min    Activity Tolerance Patient tolerated treatment well             Past Medical History:  Diagnosis Date   Acute CVA (cerebrovascular accident) (HCC) 04/12/2023   Acute left-sided low back pain with right-sided sciatica 02/14/2019   Calcified granuloma of lung 09/06/2021   Chest pain 11/11/2015   Chickenpox    Chronic alcohol use 04/12/2023   Coronary artery disease 01/12/2016   Diabetes (HCC) 01/18/2016   Type 2     Diabetes mellitus (HCC) 04/13/2021   Eczema 04/12/2022   Essential hypertension    Exertional angina (HCC)    History of CAD (coronary artery disease) 04/12/2023   HTN (hypertension) 10/20/2015   Hyperlipemia    Hyperlipidemia    Hyperlipidemia LDL goal <70 10/20/2015   Hypokalemia 04/12/2023   Insulin dependent type 2 diabetes mellitus (HCC) 07/19/2016   Kidney stones 06/21/2011   Lumbar foraminal stenosis 01/05/2022   Morbid obesity (HCC) 04/12/2023   Obesity (BMI 30-39.9) 09/26/2013   Peripheral neuropathic pain 09/06/2021   Peripheral neuropathy 04/12/2023   Preventative health care 09/29/2014   Rash 08/24/2020   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   Snoring 08/24/2020   Tobacco use disorder 09/26/2013   Type 2 diabetes mellitus with diabetic peripheral angiopathy without gangrene, without long-term current use of insulin (HCC) 09/06/2021   Type 2 diabetes mellitus with hyperglycemia, without long-term current use of insulin (HCC) 04/13/2021   Urinary frequency 08/24/2020   Past Surgical History:  Procedure  Laterality Date   CARDIAC CATHETERIZATION N/A 01/07/2016   Procedure: Left Heart Cath and Coronary Angiography;  Surgeon: Corky Crafts, MD;  Location: [facility] INVASIVE CV LAB;  Service: Cardiovascular;  Laterality: N/A;   CORONARY ARTERY BYPASS GRAFT N/A 01/13/2016   Procedure: CORONARY ARTERY BYPASS GRAFTING (CABG) x4 Endoscopic Harvesting of the Right Greater Saphenous Vein;  Surgeon: Loreli Slot, MD;  Location: [facility] OR;  Service: Open Heart Surgery;  Laterality: N/A;   TEE WITHOUT CARDIOVERSION N/A 01/13/2016   Procedure: TRANSESOPHAGEAL ECHOCARDIOGRAM (TEE);  Surgeon: Loreli Slot, MD;  Location: [facility] OR;  Service: Open Heart Surgery;  Laterality: N/A;   TONSILLECTOMY     Patient Active Problem List   Diagnosis Date Noted   Hyperlipemia    Ischemic cerebrovascular accident (CVA) (HCC) 04/12/2023   History of CAD (coronary artery disease) 04/12/2023   Chronic alcohol use 04/12/2023   Hypokalemia 04/12/2023   Morbid obesity (HCC) 04/12/2023   Peripheral neuropathy 04/12/2023   Eczema 04/12/2022   Lumbar foraminal stenosis 01/05/2022   Type 2 diabetes mellitus with diabetic peripheral angiopathy without gangrene, without long-term current use of insulin (HCC) 09/06/2021   Calcified granuloma of lung 09/06/2021   Peripheral neuropathic pain 09/06/2021   Type 2 diabetes mellitus with hyperglycemia, without long-term current use of insulin (HCC) 04/13/2021   Diabetes mellitus (HCC) 04/13/2021   Essential hypertension    Hyperlipidemia    Chickenpox    Urinary  frequency 08/24/2020   Snoring 08/24/2020   Rash 08/24/2020   Acute left-sided low back pain with right-sided sciatica 02/14/2019   Insulin dependent type 2 diabetes mellitus (HCC) 07/19/2016   Diabetes (HCC) 01/18/2016   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   Pt accompanied by: self  PERTINENT HISTORY: Per chart review: medical history significant of CAD status post CABG, hypertension, insulin-dependent DM type II, hyperlipidemia, aspirin allergy, chronic alcohol use   PAIN:  Are you having pain? Yes: NPRS scale: 6/10 Pain location: lower back Pain description: throbbing  Aggravating factors: sitting Relieving factors: medicine    OBJECTIVE:   PATIENT REPORTED OUTCOME MEASURES (PROM): Communication Effectiveness Survey 19   TODAY'S TREATMENT:                                                                                                                                         DATE:    05/16/23:    50-word intelligibility test: 40/50  Pt reported he turned the TV, shower, and breathing before bed. He reported it helped a little bit. He reported he did fall asleep faster using the breathing exercises.   Educated on "Be CLEAR" dysarthria treatment. Provided pt with handout and practiced functional phrases with min-modA verbal feedback as needed.   1. What are you doing? 2. I have an appointment. 3. Yeah, I have. 4. *Verify appointment/date* December 1st at 3:00 5. Talk to you later. 6. I need to make a deposit. 7. [employer] - Double Cheeseburger and Large Sweet Tea 8. [employer] - Number 12 Combo 9. [employer] - Tray with BBQ sandwich, Donzetta Sprung and a Corn Dog, Coke/Sprite 10. Thank you  05/09/23: Pt was seen for skilled ST services targeting education on relaxation and speech. Pt reported he is having  some anxiety at night time where he feels like he's short of breath  - concerned that he won't wake up. He reports shortness of breath is directly related to anxiety (he is not short of breath any other times). He attempts to breathe deeper and feels like this helps.   We discussed healthy ways to keep his mind active. Pt picked items off a pre-generated list of things he likes to do (or used to like to do). He chose re: reading, talking with people at work, walking. SLP educated on and demonstrated  relaxation/breathing exercises. SLP educated on the purpose of abdominal/diaphragmatic breathing for speech as well as relaxation. After educating on 4 relaxation strategies, pt reported he is most likely to try re: 4:7:8 breathing (MINUS the breath hold due to cardiovascular contraindications) and "belly breathing". SLP encouraged pt to "romanticize" his night time routine to create a more relaxing environment. Turn off TV a little earlier, dim lights, read before bed, take shower, and begin relaxation exercise as soon as he is in bed.   Briefly educated on speech strategy re: increased loudness to increase intelligibility. To begin speech strategy education next session. *AND ASSESS MEDICATION MANAGEMENT IN UPCOMING SESSIONS  PATIENT EDUCATION: Education details: Cognition, dysarthria, SLP role Person educated: Patient Education method: Explanation Education comprehension: needs further education   GOALS: Goals reviewed with patient? Yes  SHORT TERM GOALS: Target date: 06/03/23  Pt will recall 3 strategies to improve intelligibility with minA verbal cues. Baseline: Goal status: INITIAL  2.  Pt will recall 3 strategies to improve memory of important information. Baseline:  Goal status: INITIAL  3.  Pt will recall 2+ organization strategies to assist with managing at home tasks. Baseline:  Goal status: INITIAL    LONG TERM GOALS: Target date: 07/03/22  Pt will demonstrate intelligible  speech by utilizing learned compensatory strategies in unstructured conversation.  Baseline:  Goal status: INITIAL  2.  Pt will improve score on PROMs.  Baseline:  Goal status: INITIAL  3.  Pt will report improved management of bills/home tasks.  Baseline:  Goal status: INITIAL    ASSESSMENT:  CLINICAL IMPRESSION: Pt is a 62  yo male  who presents to ST OP for evaluation post CVA. See treatment note. SLP rec skilled ST services to address dysarthria and cognitive-communication impairment.    OBJECTIVE IMPAIRMENTS: include memory and dysarthria. These impairments are limiting patient from managing medications, managing appointments, managing finances, and effectively communicating at home and in community. Factors affecting potential to achieve goals and functional outcome are  NA . Patient will benefit from skilled SLP services to address above impairments and improve overall function.  REHAB POTENTIAL: Good  PLAN:  SLP FREQUENCY: 1-2x/week  SLP DURATION: 8 weeks  PLANNED INTERVENTIONS: Environmental controls, Cueing hierachy, Internal/external aids, Functional tasks, SLP instruction and feedback, Compensatory strategies, and Patient/family education    [address], CCC-SLP 05/16/2023, 11:07 AM

## 2023-05-16 NOTE — Addendum Note (Signed)
Addended by: Milinda Pointer on: 05/16/2023 10:39 AM   Modules accepted: Orders

## 2023-05-17 NOTE — Therapy (Signed)
OUTPATIENT PHYSICAL THERAPY  TREATMENT/PROGRESS NOTE AND RECERT    Patient Name: Dennis Hopkins MRN: [id number] DOB:04-21-1961, 62 y.o., male Today's Date: 05/23/2023  END OF SESSION:  PT End of Session - 05/23/23 1059     Visit Number 8    Number of Visits 13    Date for PT Re-Evaluation 06/27/23    Authorization Type [employer]    PT Start Time 1100    PT Stop Time 1145    PT Time Calculation (min) 45 min    Activity Tolerance Patient tolerated treatment well    Behavior During Therapy WFL for tasks assessed/performed                     Past Medical History:  Diagnosis Date   Acute CVA (cerebrovascular accident) (HCC) 04/12/2023   Acute left-sided low back pain with right-sided sciatica 02/14/2019   Calcified granuloma of lung 09/06/2021   Chest pain 11/11/2015   Chickenpox    Chronic alcohol use 04/12/2023   Coronary artery disease 01/12/2016   Diabetes (HCC) 01/18/2016   Type 2     Diabetes mellitus (HCC) 04/13/2021   Eczema 04/12/2022   Essential hypertension    Exertional angina (HCC)    History of CAD (coronary artery disease) 04/12/2023   HTN (hypertension) 10/20/2015   Hyperlipemia    Hyperlipidemia    Hyperlipidemia LDL goal <70 10/20/2015   Hypokalemia 04/12/2023   Insulin dependent type 2 diabetes mellitus (HCC) 07/19/2016   Kidney stones 06/21/2011   Lumbar foraminal stenosis 01/05/2022   Morbid obesity (HCC) 04/12/2023   Obesity (BMI 30-39.9) 09/26/2013   Peripheral neuropathic pain 09/06/2021   Peripheral neuropathy 04/12/2023   Preventative health care 09/29/2014   Rash 08/24/2020   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   Snoring 08/24/2020   Tobacco use disorder 09/26/2013   Type 2 diabetes mellitus with diabetic peripheral angiopathy without gangrene, without long-term current use of insulin (HCC) 09/06/2021   Type 2 diabetes mellitus with hyperglycemia, without long-term current use of insulin (HCC) 04/13/2021   Urinary frequency 08/24/2020    Past Surgical History:  Procedure Laterality Date   CARDIAC CATHETERIZATION N/A 01/07/2016   Procedure: Left Heart Cath and Coronary Angiography;  Surgeon: Corky Crafts, MD;  Location: [facility] INVASIVE CV LAB;  Service: Cardiovascular;  Laterality: N/A;   CORONARY ARTERY BYPASS GRAFT N/A 01/13/2016   Procedure: CORONARY ARTERY BYPASS GRAFTING (CABG) x4 Endoscopic Harvesting of the Right Greater Saphenous Vein;  Surgeon: Loreli Slot, MD;  Location: [facility] OR;  Service: Open Heart Surgery;  Laterality: N/A;   TEE WITHOUT CARDIOVERSION N/A 01/13/2016   Procedure: TRANSESOPHAGEAL ECHOCARDIOGRAM (TEE);  Surgeon: Loreli Slot, MD;  Location: [facility] OR;  Service: Open Heart Surgery;  Laterality: N/A;   TONSILLECTOMY     Patient Active Problem List   Diagnosis Date Noted   Hyperlipemia    Ischemic cerebrovascular accident (CVA) (HCC) 04/12/2023   History of CAD (coronary artery disease) 04/12/2023   Chronic alcohol use 04/12/2023   Hypokalemia 04/12/2023   Morbid obesity (HCC) 04/12/2023   Peripheral neuropathy 04/12/2023   Eczema 04/12/2022   Lumbar foraminal stenosis 01/05/2022   Type 2 diabetes mellitus with diabetic peripheral angiopathy without gangrene, without long-term current use of insulin (HCC) 09/06/2021   Calcified granuloma of lung 09/06/2021   Peripheral neuropathic pain 09/06/2021   Type 2 diabetes mellitus with hyperglycemia, without long-term current use of insulin (HCC) 04/13/2021   Diabetes mellitus (HCC) 04/13/2021   Essential  hypertension    Hyperlipidemia    Chickenpox    Urinary frequency 08/24/2020   Snoring 08/24/2020   Rash 08/24/2020   Acute left-sided low back pain with right-sided sciatica 02/14/2019   Insulin dependent type 2 diabetes mellitus (HCC) 07/19/2016   Diabetes (HCC) 01/18/2016   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   CAD, s/p CABG in 2017 [LIMA-LAD, SVG-OM1, SVG-RPDA] 01/12/2016   Exertional angina (HCC)    Chest pain 11/11/2015   HTN  (hypertension) 10/20/2015   Hyperlipidemia LDL goal <70 10/20/2015   Preventative health care 09/29/2014   Obesity (BMI 30-39.9) 09/26/2013   Tobacco use disorder 09/26/2013    PCP: Donato Schultz, DO   REFERRING PROVIDER: Donato Schultz, DO   REFERRING DIAG: [phone] (ICD-10-CM) - Lumbar foraminal stenosis   THERAPY DIAG:  Abnormal posture  Muscle weakness (generalized)  Other lack of coordination  Visuospatial deficit  Other abnormalities of gait and mobility  Chronic bilateral low back pain with left-sided sciatica  RATIONALE FOR EVALUATION AND TREATMENT: Rehabilitation  ONSET DATE: ~1 year   NEXT MD VISIT: PRN - none scheduled   SUBJECTIVE:                                                                                                                                                                                                         SUBJECTIVE STATEMENT: Back is still hurting but I got some heat on it in OT that helped.   PAIN: Are you having pain? Yes: NPRS scale: 6/10 Pain location: B low back, L>R Pain description: sharp Aggravating factors: sitting in unsupported position Relieving factors: heat, back support, meds   PERTINENT HISTORY:  HTN; DM-II; CA s/p CABG x 4 - 2017; Peripheral neuropathic pain; Obesity  PRECAUTIONS: None  RED FLAGS: None  WEIGHT BEARING RESTRICTIONS: No  FALLS:  Has patient fallen in last 6 months? 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    Alignment:  Facet mediated anterolisthesis of L5 on S1 of 3 mm.   Vertebrae: No fracture, suspicious marrow lesion, or significant marrow edema. Hemangioma in the L5 vertebral body. Small L5  superior endplate Schmorl's node. Multilevel Modic type 2 endplate changes.   Conus medullaris and cauda equina: Conus extends to the L1 level. Conus and cauda equina appear normal.   Paraspinal and other soft tissues: Unremarkable.   Disc levels:   Mild diffuse lumbar disc desiccation. Moderate disc space narrowing at L3-4 and L4-5.   T12-L1: Mild disc bulging without stenosis.   L1-2: Disc bulging without stenosis.   L2-3: Disc bulging and mild facet hypertrophy without stenosis.   L3-4: Disc bulging results in borderline to mild bilateral lateral recess stenosis and mild-to-moderate bilateral neural foraminal stenosis without significant spinal stenosis.   L4-5: Disc bulging results in borderline to mild bilateral lateral recess stenosis and moderate bilateral neural foraminal stenosis without significant spinal stenosis.   L5-S1: Anterolisthesis with bulging uncovered disc and moderate right and severe left facet hypertrophy result in moderate bilateral neural foraminal stenosis without spinal stenosis.   IMPRESSION: 1. Multilevel lumbar disc and facet degeneration with moderate neural foraminal stenosis at L4-5 and L5-S1. 2. No significant spinal stenosis.  11/02/21 - Lumbar spine x-ray: FINDINGS: There is no evidence for lumbar spine fracture. Spinal alignment is normal. There is mild-to-moderate disc space narrowing at L3-L4 and L4-L5, mildly progressed. Mild disc space narrowing at L5-S1 appears stable. Endplate osteophytes are seen throughout. There is facet arthropathy at L5-S1. Soft tissues are within normal limits.   IMPRESSION: 1. No acute fracture or malalignment. 2. Moderate multilevel degenerative changes throughout the spine have mildly progressed compared to 2020.  PATIENT  SURVEYS:  Modified Oswestry 18 / 50 = 36.0 %; 05/16/23 22/50  FOTO lumbar spine 52, predicted 60  SCREENING FOR RED FLAGS: Bowel or bladder incontinence: No Spinal tumors: No Cauda equina syndrome: No Compression fracture: No Abdominal aneurysm: No  COGNITION:  Overall cognitive status: Within functional limits for tasks assessed    SENSATION: Radicular pain down L>R LE to calves B diabetic peripheral neuropathy in B feet  MUSCLE LENGTH: Hamstrings: mod/severe tight L>R ITB: mod/severe tight L>R Piriformis: mod/severe tight L>R Hip flexors: mod tight B Quads: mod tight B Heelcord: NT  POSTURE:  rounded shoulders, forward head, decreased lumbar lordosis, and flexed trunk   PALPATION: Increased muscle tension in bilateral lumbar paraspinals and upper glutes, however patient denies TTP; 05/16/23 tight and sore lumbar region   LUMBAR ROM:   Active  03/28/23 - Eval 05/16/23  Flexion Hands to ankles - HS tightness Mild limitation, HS tightness   Extension 25% limited Mild limitation   Right lateral flexion Hand to fibular head ^ WNL   Left lateral flexion Hand to lateral calf WNL   Right rotation 10% limited   Left rotation 40% limited   (Blank rows = not tested, ^ = increased pain)  LOWER EXTREMITY ROM:    Limited B hip extension and ER   LOWER EXTREMITY MMT:    MMT Right eval Left eval R 04/20/23 L 04/20/23 R 05/16/23 L 05/16/23  Hip flexion 5 4 5  4+    Hip extension 4 4+ 4+ 4+    Hip abduction 4 4 4+ 4+ 5 5  Hip adduction 4+ 4+ 4+ 4+    Hip internal rotation 5 5 5 5     Hip external rotation 5 5 5 5     Knee flexion 5 5 5 5     Knee extension 5 5 5 5     Ankle dorsiflexion 5 4+ 5 5    Ankle plantarflexion 5 5 5 5     Ankle inversion  Ankle eversion         (Blank rows = not tested)  LUMBAR SPECIAL TESTS:  Straight leg raise test: Positive and Slump test: Positive - bilateral  GAIT: Distance walked: clinic distances Assistive device utilized:  None Level of assistance: Complete Independence Gait pattern: step through pattern and trunk flexed   TODAY'S TREATMENT:  05/23/23 Bike L3 x57mins   Standing shoulder extension 10# 2x10  Seated row 35# 2x10  Lat pulls 35# 2x10  Black tb ext 2x10 Prone press up on elbows 2x10 Prone hip extension 2x10 MH and e-stim 10 mins   05/16/23  Modified Oswestry, objective testing and care planning  TherEx  On MHP for pain control lumbar spine x12 minutes:  HS stretches 2x30 seconds B Piriformis stretches 2x30 seconds B  SKTC 5x10 seconds B   QL stretch 3 way x30 seconds each  Nustep L4--5 x6 minutes BLEs  Standing lumbar extensions x10 some improvement in pain    05/09/23 Bike L3 x71mins  Seated row 35# 2x10 Lat pull down 35# 2x10 Shoulder ext 10# 2x10 Leg ext 10# 2x10 HS curls 25# 2x10 Leg press 20# 2x10 STS with OHP 2x10 Supine passive stretching HS, SKTC, DKTC, trunk rotations, figure 4     04/26/23 NuStep L5x34mins  STS 2x10 Supine stretching HS, single knee to chest  Feet on ball rotations, small bridges, knees to chest x10 blackTB extension 2x10 Seated row 25# 2x10 Shoulder extension 5# x10, 10# x10    04/20/23  Re-Eval - post-hospitalization for acute CVA LE MMT unchanged or improved  Gait pattern unchanged with no LOB Review of acute care PT/OT/SLP assessment - no indication for f/u PT related to CVA, but OP OT and SLP recommended  THERAPEUTIC EXERCISE: to improve flexibility, strength and mobility.  Demonstration, verbal and tactile cues throughout for technique. Rec Bike - L2 x 6 min Standing TrA + GTB scap retraction + row 10 x 5" Standing TrA + GTB scap retraction + B shoulder extension 10 x 5" Quadruped over peanut ball: Alt hip extension 10 x 3" Alt UE raise 10 x 3" Prone press-up 10 x 3" Bird dog opp UE/LE raise 10 x 3" Quadruped cat/cow (w/o peanut ball) x 10   04/10/23  THERAPEUTIC EXERCISE: to improve flexibility, strength and mobility.   Demonstration, verbal and tactile cues throughout for technique. Rec Bike - L2 x 6 min POE x 1 min POE + alt fwd/OH reach x 10 Quadruped cat/cow x 10 Quad rocking from child's pose to prone press-up x 10  MANUAL THERAPY: To promote normalized muscle tension, improved flexibility, and reduced pain. Skilled palpation and monitoring of soft tissue during DN Trigger Point Dry-Needling  Treatment instructions: Expect mild to moderate muscle soreness. Patient verbalized understanding of these instructions and education. Patient Consent Given: Yes Education handout provided: Yes Muscles treated: B QL, lumbar erector spinae and multifidi Electrical stimulation performed: No Parameters: N/A Treatment response/outcome: Twitch Response Elicited and Palpable Increase in Muscle Length STM/DTM, manual TPR and pin & stretch to muscles addressed with DN   04/04/23 Manual Therapy: to decrease muscle spasm, pain and improve mobility.  STM to bil thoraco-lumbar PS, glutes, QL and IASTM with foam roll  Therapeutic Exercise: to improve strength and mobility.  Demo, verbal and tactile cues throughout for technique.  SKTC stretch x 30" bil LTR 3x10" both ways Brief review of remainder of HEP Hooklying pelvic tils 10x3" Hooklying marches with core brace x 10 bil Bridges x 10 with TrA  03/28/23 - Eval  THERAPEUTIC EXERCISE: to improve flexibility, strength and mobility.  Demonstration, verbal and tactile cues throughout for technique. SKTC stretch 2 x 30" Hooklying B LTR 2 x 10" POE x 1 min Prone press up x 3 Demonstrated lumbar extension options in standing but not attempted   PATIENT EDUCATION:  Education details: HEP progression - quadruped cat/cow, continue with current HEP, postural awareness, options for OP OT & SLP at [facility] at [facility], and need for D/C from OP PT if he were to start [facility] services Person educated: Patient Education method: Explanation, Demonstration, and  Handouts Education comprehension: verbalized understanding and needs further education  HOME EXERCISE PROGRAM: Access Code: [email or web address] URL: [email or web address] Date: 04/20/2023 Prepared by: Glenetta Hew  Exercises - Supine Single Knee to Chest Stretch  - 2-3 x daily - 7 x weekly - 3 reps - 30 sec hold - Supine Lower Trunk Rotation  - 2-3 x daily - 7 x weekly - 5 reps - 10 sec hold - Static Prone on Elbows  - 2-3 x daily - 7 x weekly - 2 sets - 3 reps - 30 sec hold - Standing Lumbar Extension with Counter  - 2-3 x daily - 7 x weekly - 2 sets - 10 reps - 3 sec hold - Standing Lumbar Extension at Wall - Forearms  - 2-3 x daily - 7 x weekly - 2 sets - 10 reps - 3 sec hold - Supine Posterior Pelvic Tilt  - 2-3 x daily - 7 x weekly - 2 sets - 10 reps - Supine March  - 1 x daily - 7 x weekly - 2-3 sets - 10 reps - Supine Bridge  - 1 x daily - 7 x weekly - 2-3 sets - 10 reps - Cat Cow  - 1 x daily - 7 x weekly - 2 sets - 10 reps - 5 sec hold   ASSESSMENT:  CLINICAL IMPRESSION: Pt continues to have back pain. He reports previously that e-stim has helped so we opted to try it here after some exercises. He does demonstrates an extension preference so we focused on mostly extension based exercises. Will benefit from continuation of skilled PT services especially as he is hopeful to attempt to return to work after the new year.   OBJECTIVE IMPAIRMENTS: Abnormal gait, decreased activity tolerance, decreased endurance, decreased knowledge of condition, decreased mobility, difficulty walking, decreased ROM, decreased strength, hypomobility, increased fascial restrictions, impaired perceived functional ability, increased muscle spasms, impaired flexibility, impaired sensation, improper body mechanics, postural dysfunction, and pain.   ACTIVITY LIMITATIONS: carrying, lifting, bending, sitting, standing, squatting, sleeping, stairs, transfers, bathing, dressing, reach over head, locomotion  level, and caring for others  PARTICIPATION LIMITATIONS: meal prep, cleaning, laundry, driving, shopping, community activity, occupation, and yard work  PERSONAL FACTORS: Fitness, Past/current experiences, Profession, Time since onset of injury/illness/exacerbation, and 3+ comorbidities: HTN; DM-II; CA s/p CABG x 4 - 2017; Peripheral neuropathic pain; Obesity  are also affecting patient's functional outcome.   REHAB POTENTIAL: Good  CLINICAL DECISION MAKING: Evolving/moderate complexity  EVALUATION COMPLEXITY: Moderate   GOALS: Goals reviewed with patient? Yes  SHORT TERM GOALS: Target date: 06/06/2023     Patient will be independent with initial HEP to improve outcomes and carryover.  Baseline: Initial HEP provided on eval Goal status: IN PROGRESS 05/16/23  2.  Patient will report centralization of radicular symptoms.  Baseline: L>R LE radicular pain down to calf Goal status: IN PROGRESS 05/16/23  LONG TERM GOALS: Target  date: 06/27/2023     Patient will be independent with ongoing/advanced HEP for self-management at home.  Baseline:  Goal status: IN PROGRESS 05/16/23  2.  Patient will report 50-75% improvement in low back pain and L LE radicular pain to improve QOL.  Baseline: 5/10, up to 9/10 with L>R LE radicular pain Goal status: IN PROGRESS 05/16/23  3.  Patient to demonstrate ability to achieve and maintain good spinal alignment/posturing and body mechanics needed for daily activities. Baseline: Flexed trunk posture with rounded shoulders and forward head Goal status: IN PROGRESS 05/16/23  4.  Patient will demonstrate functional pain free lumbar ROM to perform ADLs.   Baseline: Refer to above lumbar ROM table Goal status: IN PROGRESS 05/16/23  5.  Patient will demonstrate improved B LE strength to >/= 4+/5 for improved stability and ease of mobility . Baseline: Refer to above MMT table Goal status: MET 05/16/23  6.  Patient will report 60 on lumbar FOTO to  demonstrate improved functional ability.  Baseline: 52 Goal status: IN PROGRESS  05/16/23  7. Patient will report </= 24% on Modified Oswestry to demonstrate improved functional ability with decreased pain interference. Baseline: 18 / 50 = 36.0 %  Goal status: IN PROGRESS 05/16/23  8.  Patient will report ability to resume walking for wellness program at work without limitation due to LBP or L LE radiculopathy. Baseline: Patient has been unable to walk for wellness program due to pain Goal status: IN PROGRESS 05/16/23   PLAN:  PT FREQUENCY: 1x/week   PT DURATION: 6 weeks  PLANNED INTERVENTIONS: Therapeutic exercises, Therapeutic activity, Neuromuscular re-education, Balance training, Gait training, Patient/Family education, Self Care, Joint mobilization, Stair training, Aquatic Therapy, Dry Needling, Electrical stimulation, Spinal manipulation, Spinal mobilization, Cryotherapy, Moist heat, Taping, Traction, Ultrasound, Ionotophoresis 4mg /ml Dexamethasone, Manual therapy, and Re-evaluation  PLAN FOR NEXT SESSION: progress lumbopelvic flexibility and strengthening with extension preference, leg machine. Modalities PRN   Cassie Freer, PT, DPT 05/23/23 11:47 AM

## 2023-05-22 ENCOUNTER — Other Ambulatory Visit: Payer: Self-pay | Admitting: Family Medicine

## 2023-05-22 DIAGNOSIS — M48061 Spinal stenosis, lumbar region without neurogenic claudication: Secondary | ICD-10-CM

## 2023-05-22 NOTE — Telephone Encounter (Signed)
Prescription Request  05/22/2023  Is this a "Controlled Substance" medicine? No  LOV: 04/20/2023  What is the name of the medication or equipment? traMADol (ULTRAM) 50 MG tablet  Have you contacted your pharmacy to request a refill? No   Which pharmacy would you like this sent to?   [facility] [id number] - [place], [place] - [address] [place] [address] Phone: [phone] Fax: [phone]   Patient notified that their request is being sent to the clinical staff for review and that they should receive a response within 2 business days.   Please advise at [facility] [phone]

## 2023-05-23 ENCOUNTER — Ambulatory Visit: Payer: PRIVATE HEALTH INSURANCE | Attending: Family Medicine | Admitting: Speech Pathology

## 2023-05-23 ENCOUNTER — Ambulatory Visit: Payer: PRIVATE HEALTH INSURANCE | Admitting: Occupational Therapy

## 2023-05-23 ENCOUNTER — Encounter: Payer: Self-pay | Admitting: Speech Pathology

## 2023-05-23 ENCOUNTER — Encounter: Payer: Self-pay | Admitting: Occupational Therapy

## 2023-05-23 ENCOUNTER — Ambulatory Visit: Payer: PRIVATE HEALTH INSURANCE

## 2023-05-23 DIAGNOSIS — M6281 Muscle weakness (generalized): Secondary | ICD-10-CM

## 2023-05-23 DIAGNOSIS — G8929 Other chronic pain: Secondary | ICD-10-CM | POA: Diagnosis present

## 2023-05-23 DIAGNOSIS — R41842 Visuospatial deficit: Secondary | ICD-10-CM

## 2023-05-23 DIAGNOSIS — R293 Abnormal posture: Secondary | ICD-10-CM | POA: Insufficient documentation

## 2023-05-23 DIAGNOSIS — R41841 Cognitive communication deficit: Secondary | ICD-10-CM | POA: Insufficient documentation

## 2023-05-23 DIAGNOSIS — R2689 Other abnormalities of gait and mobility: Secondary | ICD-10-CM | POA: Insufficient documentation

## 2023-05-23 DIAGNOSIS — R278 Other lack of coordination: Secondary | ICD-10-CM

## 2023-05-23 DIAGNOSIS — R208 Other disturbances of skin sensation: Secondary | ICD-10-CM | POA: Diagnosis present

## 2023-05-23 DIAGNOSIS — M5459 Other low back pain: Secondary | ICD-10-CM | POA: Insufficient documentation

## 2023-05-23 DIAGNOSIS — R471 Dysarthria and anarthria: Secondary | ICD-10-CM | POA: Diagnosis present

## 2023-05-23 DIAGNOSIS — M5442 Lumbago with sciatica, left side: Secondary | ICD-10-CM | POA: Diagnosis present

## 2023-05-23 MED ORDER — TRAMADOL HCL 50 MG PO TABS: 50.0000 mg | ORAL_TABLET | Freq: Four times a day (QID) | ORAL | 0 refills | Status: DC | PRN

## 2023-05-23 NOTE — Addendum Note (Signed)
Addended by: Roxanne Gates on: 05/23/2023 08:37 AM   Modules accepted: Orders

## 2023-05-23 NOTE — Therapy (Unsigned)
OUTPATIENT SPEECH LANGUAGE PATHOLOGY TREATMENT    Patient Name: Dennis Hopkins MRN: [id number] DOB:Sep 15, 1960, 62 y.o., male Today's Date: 05/23/2023  PCP: Donato Schultz, DO REFERRING PROVIDER: Donato Schultz, DO  END OF SESSION:  End of Session - 05/23/23 0933     Visit Number 4    Number of Visits 17    Date for SLP Re-Evaluation 07/03/23    SLP Start Time 0930    SLP Stop Time  1010    SLP Time Calculation (min) 40 min    Activity Tolerance Patient tolerated treatment well             Past Medical History:  Diagnosis Date   Acute CVA (cerebrovascular accident) (HCC) 04/12/2023   Acute left-sided low back pain with right-sided sciatica 02/14/2019   Calcified granuloma of lung 09/06/2021   Chest pain 11/11/2015   Chickenpox    Chronic alcohol use 04/12/2023   Coronary artery disease 01/12/2016   Diabetes (HCC) 01/18/2016   Type 2     Diabetes mellitus (HCC) 04/13/2021   Eczema 04/12/2022   Essential hypertension    Exertional angina (HCC)    History of CAD (coronary artery disease) 04/12/2023   HTN (hypertension) 10/20/2015   Hyperlipemia    Hyperlipidemia    Hyperlipidemia LDL goal <70 10/20/2015   Hypokalemia 04/12/2023   Insulin dependent type 2 diabetes mellitus (HCC) 07/19/2016   Kidney stones 06/21/2011   Lumbar foraminal stenosis 01/05/2022   Morbid obesity (HCC) 04/12/2023   Obesity (BMI 30-39.9) 09/26/2013   Peripheral neuropathic pain 09/06/2021   Peripheral neuropathy 04/12/2023   Preventative health care 09/29/2014   Rash 08/24/2020   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   Snoring 08/24/2020   Tobacco use disorder 09/26/2013   Type 2 diabetes mellitus with diabetic peripheral angiopathy without gangrene, without long-term current use of insulin (HCC) 09/06/2021   Type 2 diabetes mellitus with hyperglycemia, without long-term current use of insulin (HCC) 04/13/2021   Urinary frequency 08/24/2020   Past Surgical History:  Procedure  Laterality Date   CARDIAC CATHETERIZATION N/A 01/07/2016   Procedure: Left Heart Cath and Coronary Angiography;  Surgeon: Corky Crafts, MD;  Location: [facility] INVASIVE CV LAB;  Service: Cardiovascular;  Laterality: N/A;   CORONARY ARTERY BYPASS GRAFT N/A 01/13/2016   Procedure: CORONARY ARTERY BYPASS GRAFTING (CABG) x4 Endoscopic Harvesting of the Right Greater Saphenous Vein;  Surgeon: Loreli Slot, MD;  Location: [facility] OR;  Service: Open Heart Surgery;  Laterality: N/A;   TEE WITHOUT CARDIOVERSION N/A 01/13/2016   Procedure: TRANSESOPHAGEAL ECHOCARDIOGRAM (TEE);  Surgeon: Loreli Slot, MD;  Location: [facility] OR;  Service: Open Heart Surgery;  Laterality: N/A;   TONSILLECTOMY     Patient Active Problem List   Diagnosis Date Noted   Hyperlipemia    Ischemic cerebrovascular accident (CVA) (HCC) 04/12/2023   History of CAD (coronary artery disease) 04/12/2023   Chronic alcohol use 04/12/2023   Hypokalemia 04/12/2023   Morbid obesity (HCC) 04/12/2023   Peripheral neuropathy 04/12/2023   Eczema 04/12/2022   Lumbar foraminal stenosis 01/05/2022   Type 2 diabetes mellitus with diabetic peripheral angiopathy without gangrene, without long-term current use of insulin (HCC) 09/06/2021   Calcified granuloma of lung 09/06/2021   Peripheral neuropathic pain 09/06/2021   Type 2 diabetes mellitus with hyperglycemia, without long-term current use of insulin (HCC) 04/13/2021   Diabetes mellitus (HCC) 04/13/2021   Essential hypertension    Hyperlipidemia    Chickenpox    Urinary  frequency 08/24/2020   Snoring 08/24/2020   Rash 08/24/2020   Acute left-sided low back pain with right-sided sciatica 02/14/2019   Insulin dependent type 2 diabetes mellitus (HCC) 07/19/2016   Diabetes (HCC) 01/18/2016   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   CAD, s/p CABG in 2017 [LIMA-LAD, SVG-OM1, SVG-RPDA] 01/12/2016   Exertional angina (HCC)    Chest pain 11/11/2015   HTN (hypertension) 10/20/2015   Hyperlipidemia LDL  goal <70 10/20/2015   Preventative health care 09/29/2014   Obesity (BMI 30-39.9) 09/26/2013   Tobacco use disorder 09/26/2013    ONSET DATE: 04/12/23   REFERRING DIAG: I63.9 (ICD-10-CM) - Cerebral infarction, unspecified   THERAPY DIAG:  Cognitive communication deficit  Dysarthria and anarthria  Rationale for Evaluation and Treatment: Rehabilitation  SUBJECTIVE:   SUBJECTIVE STATEMENT: Pt reports he has been having to think hard about using his strategies.   Pt accompanied by: self  PERTINENT HISTORY: Per chart review: medical history significant of CAD status post CABG, hypertension, insulin-dependent DM type II, hyperlipidemia, aspirin allergy, chronic alcohol use   PAIN:  Are you having pain? Yes: NPRS scale: 7/10 Pain location: lower back Pain description: throbbing  Aggravating factors: sitting Relieving factors: medicine    OBJECTIVE:   PATIENT REPORTED OUTCOME MEASURES (PROM): Communication Effectiveness Survey 19   TODAY'S TREATMENT:                                                                                                                                         DATE:   05/23/23: Pt was seen for skilled ST services targeting cognition and dysarthria. Upon entering in room, pt speech was observed to be dysarthric ~75% intelligible. Pt did not appear to be self-monitoring his speech. SLP challenged pt cognition with pill box task. Pt currently manages his own medications, but does not have a pill box.   05/16/23:    50-word intelligibility test: 40/50  Pt reported he turned the TV, shower, and breathing before bed. He reported it helped a little bit. He reported he did fall asleep faster using the breathing exercises.   Educated on "Be CLEAR" dysarthria treatment. Provided pt with handout and practiced functional phrases with min-modA verbal feedback as needed.   1. What are you doing? 2. I have an appointment. 3. Yeah, I have. 4. *Verify  appointment/date* December 1st at 3:00 5. Talk to you later. 6. I need to make a deposit. 7. [employer] - Double Cheeseburger and Large Sweet Tea 8. [employer] - Number 12 Combo 9. [employer] - Tray with BBQ sandwich, Donzetta Sprung and a Corn Dog, Coke/Sprite 10. Thank you  05/09/23: Pt was seen for skilled ST services targeting education on relaxation and speech. Pt reported he is having some anxiety at night time where he feels like he's short of breath  - concerned that he won't wake up. He reports shortness of breath is directly related to anxiety (  he is not short of breath any other times). He attempts to breathe deeper and feels like this helps.   We discussed healthy ways to keep his mind active. Pt picked items off a pre-generated list of things he likes to do (or used to like to do). He chose re: reading, talking with people at work, walking. SLP educated on and demonstrated relaxation/breathing exercises. SLP educated on the purpose of abdominal/diaphragmatic breathing for speech as well as relaxation. After educating on 4 relaxation strategies, pt reported he is most likely to try re: 4:7:8 breathing (MINUS the breath hold due to cardiovascular contraindications) and "belly breathing". SLP encouraged pt to "romanticize" his night time routine to create a more relaxing environment. Turn off TV a little earlier, dim lights, read before bed, take shower, and begin relaxation exercise as soon as he is in bed.   Briefly educated on speech strategy re: increased loudness to increase intelligibility. To begin speech strategy education next session. *AND ASSESS MEDICATION MANAGEMENT IN UPCOMING SESSIONS  PATIENT EDUCATION: Education details: Cognition, dysarthria, SLP role Person educated: Patient Education method: Explanation Education comprehension: needs further education   GOALS: Goals reviewed with patient? Yes  SHORT TERM GOALS: Target date: 06/03/23  Pt will recall 3 strategies to improve  intelligibility with minA verbal cues. Baseline: Goal status: INITIAL  2.  Pt will recall 3 strategies to improve memory of important information. Baseline:  Goal status: INITIAL  3.  Pt will recall 2+ organization strategies to assist with managing at home tasks. Baseline:  Goal status: INITIAL    LONG TERM GOALS: Target date: 07/03/22  Pt will demonstrate intelligible speech by utilizing learned compensatory strategies in unstructured conversation.  Baseline:  Goal status: INITIAL  2.  Pt will improve score on PROMs.  Baseline:  Goal status: INITIAL  3.  Pt will report improved management of bills/home tasks.  Baseline:  Goal status: INITIAL    ASSESSMENT:  CLINICAL IMPRESSION: Pt is a 62  yo male  who presents to ST OP for evaluation post CVA. See treatment note. SLP rec skilled ST services to address dysarthria and cognitive-communication impairment.    OBJECTIVE IMPAIRMENTS: include memory and dysarthria. These impairments are limiting patient from managing medications, managing appointments, managing finances, and effectively communicating at home and in community. Factors affecting potential to achieve goals and functional outcome are  NA . Patient will benefit from skilled SLP services to address above impairments and improve overall function.  REHAB POTENTIAL: Good  PLAN:  SLP FREQUENCY: 1-2x/week  SLP DURATION: 8 weeks  PLANNED INTERVENTIONS: Environmental controls, Cueing hierachy, Internal/external aids, Functional tasks, SLP instruction and feedback, Compensatory strategies, and Patient/family education    [employer], CCC-SLP 05/23/2023, 9:34 AM

## 2023-05-23 NOTE — Therapy (Signed)
OUTPATIENT OCCUPATIONAL THERAPY NEURO Treatment  Patient Name: Dennis Hopkins MRN: [id number] DOB:08/17/1960, 62 y.o., male Today's Date: 05/23/2023  PCP: Dr. Laury Axon REFERRING PROVIDER: Dr. Laury Axon  END OF SESSION:  OT End of Session - 05/23/23 1211     Visit Number 5    Number of Visits 13    Date for OT Re-Evaluation 07/19/23    Authorization Type Generic provider    Authorization - Visit Number 4    Authorization - Number of Visits 12    OT Start Time 1016    OT Stop Time 1057    OT Time Calculation (min) 41 min                 Past Medical History:  Diagnosis Date   Acute CVA (cerebrovascular accident) (HCC) 04/12/2023   Acute left-sided low back pain with right-sided sciatica 02/14/2019   Calcified granuloma of lung 09/06/2021   Chest pain 11/11/2015   Chickenpox    Chronic alcohol use 04/12/2023   Coronary artery disease 01/12/2016   Diabetes (HCC) 01/18/2016   Type 2     Diabetes mellitus (HCC) 04/13/2021   Eczema 04/12/2022   Essential hypertension    Exertional angina (HCC)    History of CAD (coronary artery disease) 04/12/2023   HTN (hypertension) 10/20/2015   Hyperlipemia    Hyperlipidemia    Hyperlipidemia LDL goal <70 10/20/2015   Hypokalemia 04/12/2023   Insulin dependent type 2 diabetes mellitus (HCC) 07/19/2016   Kidney stones 06/21/2011   Lumbar foraminal stenosis 01/05/2022   Morbid obesity (HCC) 04/12/2023   Obesity (BMI 30-39.9) 09/26/2013   Peripheral neuropathic pain 09/06/2021   Peripheral neuropathy 04/12/2023   Preventative health care 09/29/2014   Rash 08/24/2020   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 INVASIVE CV LAB;  Service: Cardiovascular;  Laterality: N/A;   CORONARY ARTERY BYPASS GRAFT N/A 01/13/2016   Procedure: CORONARY ARTERY BYPASS GRAFTING (CABG) x4 Endoscopic Harvesting of the Right Greater Saphenous Vein;  Surgeon: Loreli Slot, MD;  Location: [facility] OR;  Service: Open Heart Surgery;  Laterality: N/A;   TEE WITHOUT CARDIOVERSION N/A 01/13/2016   Procedure: TRANSESOPHAGEAL ECHOCARDIOGRAM (TEE);  Surgeon: Loreli Slot, MD;  Location: [facility] OR;  Service: Open Heart Surgery;  Laterality: N/A;   TONSILLECTOMY     Patient Active Problem List   Diagnosis Date Noted   Hyperlipemia    Ischemic cerebrovascular accident (CVA) (HCC) 04/12/2023   History of CAD (coronary artery disease) 04/12/2023   Chronic alcohol use 04/12/2023   Hypokalemia 04/12/2023   Morbid obesity (HCC) 04/12/2023   Peripheral neuropathy 04/12/2023   Eczema 04/12/2022   Lumbar foraminal stenosis 01/05/2022   Type 2 diabetes mellitus with diabetic peripheral angiopathy without gangrene, without long-term current use of insulin (HCC) 09/06/2021   Calcified granuloma of lung 09/06/2021   Peripheral neuropathic pain 09/06/2021   Type 2 diabetes mellitus with hyperglycemia, without long-term current use of insulin (HCC) 04/13/2021   Diabetes mellitus (HCC) 04/13/2021   Essential hypertension  Hyperlipidemia    Chickenpox    Urinary frequency 08/24/2020   Snoring 08/24/2020   Rash 08/24/2020   Acute left-sided low back pain with right-sided sciatica 02/14/2019   Insulin dependent type 2 diabetes mellitus (HCC) 07/19/2016   Diabetes (HCC) 01/18/2016   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   CAD, s/p CABG in 2017 [LIMA-LAD, SVG-OM1, SVG-RPDA] 01/12/2016   Exertional angina (HCC)    Chest pain 11/11/2015   HTN (hypertension)  10/20/2015   Hyperlipidemia LDL goal <70 10/20/2015   Preventative health care 09/29/2014   Obesity (BMI 30-39.9) 09/26/2013   Tobacco use disorder 09/26/2013    ONSET DATE: 04/12/23  REFERRING DIAG:  Diagnosis  I63.9 (ICD-10-CM) - Ischemic cerebrovascular accident (CVA) (HCC)  R29.898 (ICD-10-CM) - Right hand weakness    THERAPY DIAG:  Abnormal posture  Muscle weakness (generalized)  Other lack of coordination  Visuospatial deficit  Other abnormalities of gait and mobility  Rationale for Evaluation and Treatment: Rehabilitation  SUBJECTIVE:   SUBJECTIVE STATEMENT: Pt reports  onging back pain  PERTINENT HISTORY: Per chart 62 y.o. male presented to emergency department 04/12/23 with complaining of right-sided weakness; CTA head which showed left MCA territory infarction along with ICA severe stenosis as well as occlusion of the left vertebral artery. MRI multiple acute infarcts L MCA and PCA territories, old R frontal infarct PMH significant of CAD status post CABG, hypertension, insulin-dependent DM type II, hyperlipidemia, aspirin allergy, chronic alcohol use, and obesity   PRECAUTIONS: Fall   PAIN:  Are you having pain? Yes: NPRS scale: 7/10 Pain location: back Pain description: aching Aggravating factors: standing Relieving factors: rest, heat , OT will not address directly as pt is seeing PT for this  FALLS: Has patient fallen in last 6 months? No  LIVING ENVIRONMENT: Lives with: lives alone Lives in: House/apartment Stairs: No Has following equipment at home: Environmental consultant - 4 wheeled, Wheelchair (manual), and shower chair  PLOF: Independent  PATIENT GOALS: improve use of right hand   OBJECTIVE:  Note: Objective measures were completed at Evaluation unless otherwise noted.  HAND DOMINANCE: Right  ADLs: Overall ADLs: increased time required and difficulty using R dominant hand due to weakness and incoordination Transfers/ambulation related to  ADLs: Eating: difficulty feeding self with RUE, and cutting food  Grooming: difficulty shaving pt cut himself UB Dressing: mod I LB Dressing: mod I Toileting: mod i Bathing: mod I  Tub Shower transfers: mod I Equipment: Shower seat with back  IADLs: Shopping: does light shopping  Meal Prep: using microwave  Community mobility: mod I Medication management: keeps  up with own meds Financial management: grandson helps Handwriting: Not legible  MOBILITY STATUS: Independent       UPPER EXTREMITY ROM:    Active ROM Right eval Left eval  Shoulder flexion 125   Shoulder abduction    Shoulder adduction    Shoulder extension    Shoulder internal rotation    Shoulder external rotation    Elbow flexion    Elbow extension -10   Wrist flexion 63   Wrist extension 40   Wrist ulnar deviation    Wrist radial deviation    Wrist pronation    Wrist supination    (Blank rows = not tested)   HAND FUNCTION: Grip strength: Right: 40 lbs; Left: 75 lbs  COORDINATION: 9 Hole Peg test: Right: 1 min 45 sec; Left: 33.34 sec   SENSATION: Mild numbness in RUE  EDEMA: mild edema in RUE    COGNITION: Overall cognitive  status:  to be further assessed in a functional context  VISION: Subjective report: Pt reports he got new glasses but they are not working as well. Baseline vision: Wears glasses for reading only   VISION ASSESSMENT: Visual Fields: per hospital notes right visual field deficits, per gross assessment no significant deficit noted will assess in functional context number cancellation: 2/44 missed Pt was cautioned to exercise caution and double check on the right side when driving, as pt reports MD cleared him to drive      OBSERVATIONS: Pleasant male with speech difficulties is agreeable to OT treatment   TODAY'S TREATMENT:                                                                                                                               DATE:05/23/23- Therapist started checking progress towards goals. see goals for progress. Hotpack applied to low back x 20 mins for pain relief, no adverse reactions while performing exercises.  Cane exercises with 2 lbs cane for shoulder flexion, chest pres, biceps curls 10-15 rpes each, min v.c Environmental scanning 14/15 located on first pass, pt located remaining items without v.c Copying small peg design with RUE for increased fine motor coordination, min difficulty and increased time required. Removing with in hand manipulation, min v.c  Placing graded clothespins on vertical target with RUE for sustained pinch 1-8#, min v.c for tip or 3 pt pinch..  05/16/23- Seated Using 2 lbs cane pt performed 20 reps each for shoulder flexion, chest press, and biceps curls, min v.c Hotpack applied to back x 15 mins while seated, for pain no adverse reactions. Copying small peg design with RUE for increased fine motor coordination, min-mod difficulty and increased time required. Flipping playing cards, stacking coins and manipulating in hand for increased fine motor coordiantion, min v.c   UBE x 6 mins level 1 for conditioning.    05/09/23- Cane exercises for shoulder flexion, abduction and biceps curls 10-15 reps each, min v.c  Functional reaching mid range to place large pegs in vertical pegboard with RUE, mod v.c/ mod difficulty and drops Placing and removing graded clothespins from target with RUE for sustained pinch 1-8#, min v.c  Flipping and dealing playing cards with RUE for increased fine motor coordiantion, min v.c  UBE x 6 mins level 1 for conditioning  05/02/23 Pt was instructed in initial HEP for cane exercises in sitting and exercises for wrist, forearm and hand and beginning coordination, min v.c and demonstration. Fit of prefab resting splint was assessed however since pt now has increased function in hand, therapist recommends pt does not wear splint so he can use hand. Arm bike x 6  mins level 1 for conditioning 04/26/23 -eval only  PATIENT EDUCATION: Education details:see above Person educated: Patient Education method: Verbal cues, demonstration,  Education comprehension: verbalized understanding, returned demonstration  HOME EXERCISE PROGRAM: Cane exercises, beginning, coordination 05/02/23   GOALS: Goals reviewed with patient? Yes  SHORT TERM GOALS: Target date: 05/25/23  I with initial HEP  Goal status: met 05/23/23  2.  Pt will demonstrate improved fine motor coordination as evidenced by decreasing 9 hole peg test score by 5 secs from initial. Baseline: 1 min 45 secs Goal status: met 44 secs 05/23/23  3.  Pt will demonstrate understanding of edema control techniques and sensory precautions  Goal status:  met, verbalizes understanding  4.  Pt will demonstrate grossly 90% composite finger flexion with RUE for increased functional use. Baseline: 85% Goal status: met, 05/23/23  5.  Pt will report consistently feeding himself with RUE.  Goal status: met, 05/23/23  6.  I with with adaptive equipment and strategies to maximize pt's safety and I with ADLs/IADLS.  Goal status: ongoing, 05/23/23 7. Pt will perform environmental scanning with 90% or better accuracy. Goal status:met 93% 05/23/23  LONG TERM GOALS: Target date: 07/19/23  I with updated HEP. Baseline:  Goal status: INITIAL  2.  Pt will demonstrate improved fine motor coordination for ADLs as evidenced by decreasing 9 hole peg test score  to 90 secs Baseline: 1 min 45 secs Goal status: INITIAL  3.  Pt will increase RUE grip strength to 50 lbs for increased functional use. Baseline: 40 lbs Goal status: INITIAL  4.  Pt will resume use of RUE as dominant hand 50% of the time for ADLS/light IADLs.  Goal status: INITIAL  5.  Pt will demonstrate ability to retrieve a 3 lbs object from overhead shelf without drops x 5 reps  Goal status: INITIAL  6.  Pt will demonstrate ability to carry  an 8 lbs bag of groceries with RUE.  Goal status: INITIAL   ASSESSMENT:  CLINICAL IMPRESSION: Pt is progressing towards goal s with improved coordination and strength. He has achieved 6/7 short term goals. See above    PERFORMANCE DEFICITS: in functional skills including ADLs, IADLs, coordination, dexterity, sensation, edema, ROM, strength, pain, flexibility, Fine motor control, Gross motor control, mobility, balance, endurance, decreased knowledge of precautions, decreased knowledge of use of DME, vision, and UE functional use, cognitive skills including attention, problem solving, safety awareness, thought, and understand, and psychosocial skills including coping strategies, environmental adaptation, habits, interpersonal interactions, and routines and behaviors.   IMPAIRMENTS: are limiting patient from ADLs, IADLs, rest and sleep, work, play, leisure, and social participation.   CO-MORBIDITIES: may have co-morbidities  that affects occupational performance. Patient will benefit from skilled OT to address above impairments and improve overall function.  MODIFICATION OR ASSISTANCE TO COMPLETE EVALUATION: No modification of tasks or assist necessary to complete an evaluation.  OT OCCUPATIONAL PROFILE AND HISTORY: Detailed assessment: Review of records and additional review of physical, cognitive, psychosocial history related to current functional performance.  CLINICAL DECISION MAKING: LOW - limited treatment options, no task modification necessary  REHAB POTENTIAL: Good  EVALUATION COMPLEXITY: Low    PLAN:  OT FREQUENCY: 1x/week plus  OT DURATION: 12 weeks (however may wrap up dependent upon progress and insurance limitations)  PLANNED INTERVENTIONS: 16109 OT Re-evaluation, 97535 self care/ADL training, 60454 therapeutic exercise, 97530 therapeutic activity, 97112 neuromuscular re-education, 97140 manual therapy, 97113 aquatic therapy, 97035 ultrasound, 97018 paraffin, 09811  moist heat, 97010 cryotherapy, 97129 Cognitive training (first 15 min), 91478 Cognitive training(each additional 15 min), 29562 Splinting (initial encounter), passive range of motion, functional mobility training, visual/perceptual remediation/compensation, psychosocial skills training, energy conservation, coping strategies training, patient/family education, and DME and/or AE instructions  RECOMMENDED OTHER SERVICES: ST  CONSULTED AND AGREED  WITH PLAN OF CARE: Patient  PLAN FOR NEXT SESSION: functional use of RUE, progress HEP   Marlys Stegmaier, OT 05/23/2023, 12:13 PM

## 2023-05-23 NOTE — Telephone Encounter (Signed)
Requesting: Tramadol Contract:  UDS: Last OV: 04/20/23 Next OV: n/a Last Refill: 04/17/23, #[id number] RF Database:   Please advise

## 2023-05-30 ENCOUNTER — Encounter: Payer: Self-pay | Admitting: Physical Therapy

## 2023-05-30 ENCOUNTER — Ambulatory Visit: Payer: PRIVATE HEALTH INSURANCE | Admitting: Physical Therapy

## 2023-05-30 ENCOUNTER — Encounter: Payer: Self-pay | Admitting: Speech Pathology

## 2023-05-30 ENCOUNTER — Ambulatory Visit: Payer: PRIVATE HEALTH INSURANCE | Admitting: Occupational Therapy

## 2023-05-30 ENCOUNTER — Ambulatory Visit: Payer: PRIVATE HEALTH INSURANCE | Admitting: Speech Pathology

## 2023-05-30 ENCOUNTER — Ambulatory Visit: Payer: PRIVATE HEALTH INSURANCE | Admitting: Family Medicine

## 2023-05-30 DIAGNOSIS — R471 Dysarthria and anarthria: Secondary | ICD-10-CM

## 2023-05-30 DIAGNOSIS — R2689 Other abnormalities of gait and mobility: Secondary | ICD-10-CM

## 2023-05-30 DIAGNOSIS — R293 Abnormal posture: Secondary | ICD-10-CM

## 2023-05-30 DIAGNOSIS — R41841 Cognitive communication deficit: Secondary | ICD-10-CM | POA: Diagnosis not present

## 2023-05-30 DIAGNOSIS — M6281 Muscle weakness (generalized): Secondary | ICD-10-CM

## 2023-05-30 DIAGNOSIS — R278 Other lack of coordination: Secondary | ICD-10-CM

## 2023-05-30 DIAGNOSIS — R41842 Visuospatial deficit: Secondary | ICD-10-CM

## 2023-05-30 DIAGNOSIS — G8929 Other chronic pain: Secondary | ICD-10-CM

## 2023-05-30 NOTE — Therapy (Signed)
OUTPATIENT OCCUPATIONAL THERAPY NEURO Treatment   Patient Name: Dennis Hopkins MRN: [id number] DOB:Jul 30, 1960, 62 y.o., male Today's Date: 05/30/2023  PCP: Dr. Laury Axon REFERRING PROVIDER: Dr. Laury Axon  END OF SESSION:  OT End of Session - 05/30/23 1021     Visit Number 6    Number of Visits 13    Date for OT Re-Evaluation 07/19/23    Authorization Type Generic provider    Authorization - Visit Number 5    Authorization - Number of Visits 12    OT Start Time 1018    OT Stop Time 1058    OT Time Calculation (min) 40 min                 Past Medical History:  Diagnosis Date   Acute CVA (cerebrovascular accident) (HCC) 04/12/2023   Acute left-sided low back pain with right-sided sciatica 02/14/2019   Calcified granuloma of lung 09/06/2021   Chest pain 11/11/2015   Chickenpox    Chronic alcohol use 04/12/2023   Coronary artery disease 01/12/2016   Diabetes (HCC) 01/18/2016   Type 2     Diabetes mellitus (HCC) 04/13/2021   Eczema 04/12/2022   Essential hypertension    Exertional angina (HCC)    History of CAD (coronary artery disease) 04/12/2023   HTN (hypertension) 10/20/2015   Hyperlipemia    Hyperlipidemia    Hyperlipidemia LDL goal <70 10/20/2015   Hypokalemia 04/12/2023   Insulin dependent type 2 diabetes mellitus (HCC) 07/19/2016   Kidney stones 06/21/2011   Lumbar foraminal stenosis 01/05/2022   Morbid obesity (HCC) 04/12/2023   Obesity (BMI 30-39.9) 09/26/2013   Peripheral neuropathic pain 09/06/2021   Peripheral neuropathy 04/12/2023   Preventative health care 09/29/2014   Rash 08/24/2020   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   Snoring 08/24/2020   Tobacco use disorder 09/26/2013   Type 2 diabetes mellitus with diabetic peripheral angiopathy without gangrene, without long-term current use of insulin (HCC) 09/06/2021   Type 2 diabetes mellitus with hyperglycemia, without long-term current use of insulin (HCC) 04/13/2021   Urinary frequency 08/24/2020   Past  Surgical History:  Procedure Laterality Date   CARDIAC CATHETERIZATION N/A 01/07/2016   Procedure: Left Heart Cath and Coronary Angiography;  Surgeon: Corky Crafts, MD;  Location: [facility] INVASIVE CV LAB;  Service: Cardiovascular;  Laterality: N/A;   CORONARY ARTERY BYPASS GRAFT N/A 01/13/2016   Procedure: CORONARY ARTERY BYPASS GRAFTING (CABG) x4 Endoscopic Harvesting of the Right Greater Saphenous Vein;  Surgeon: Loreli Slot, MD;  Location: [facility] OR;  Service: Open Heart Surgery;  Laterality: N/A;   TEE WITHOUT CARDIOVERSION N/A 01/13/2016   Procedure: TRANSESOPHAGEAL ECHOCARDIOGRAM (TEE);  Surgeon: Loreli Slot, MD;  Location: [facility] OR;  Service: Open Heart Surgery;  Laterality: N/A;   TONSILLECTOMY     Patient Active Problem List   Diagnosis Date Noted   Hyperlipemia    Ischemic cerebrovascular accident (CVA) (HCC) 04/12/2023   History of CAD (coronary artery disease) 04/12/2023   Chronic alcohol use 04/12/2023   Hypokalemia 04/12/2023   Morbid obesity (HCC) 04/12/2023   Peripheral neuropathy 04/12/2023   Eczema 04/12/2022   Lumbar foraminal stenosis 01/05/2022   Type 2 diabetes mellitus with diabetic peripheral angiopathy without gangrene, without long-term current use of insulin (HCC) 09/06/2021   Calcified granuloma of lung 09/06/2021   Peripheral neuropathic pain 09/06/2021   Type 2 diabetes mellitus with hyperglycemia, without long-term current use of insulin (HCC) 04/13/2021   Diabetes mellitus (HCC) 04/13/2021   Essential hypertension  Hyperlipidemia    Chickenpox    Urinary frequency 08/24/2020   Snoring 08/24/2020   Rash 08/24/2020   Acute left-sided low back pain with right-sided sciatica 02/14/2019   Insulin dependent type 2 diabetes mellitus (HCC) 07/19/2016   Diabetes (HCC) 01/18/2016   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   CAD, s/p CABG in 2017 [LIMA-LAD, SVG-OM1, SVG-RPDA] 01/12/2016   Exertional angina (HCC)    Chest pain 11/11/2015   HTN (hypertension)  10/20/2015   Hyperlipidemia LDL goal <70 10/20/2015   Preventative health care 09/29/2014   Obesity (BMI 30-39.9) 09/26/2013   Tobacco use disorder 09/26/2013    ONSET DATE: 04/12/23  REFERRING DIAG:  Diagnosis  I63.9 (ICD-10-CM) - Ischemic cerebrovascular accident (CVA) (HCC)  R29.898 (ICD-10-CM) - Right hand weakness    THERAPY DIAG:  Abnormal posture  Muscle weakness (generalized)  Other lack of coordination  Visuospatial deficit  Other abnormalities of gait and mobility  Rationale for Evaluation and Treatment: Rehabilitation  SUBJECTIVE:   SUBJECTIVE STATEMENT: Pt reports  significant back pain  PERTINENT HISTORY: Per chart 62 y.o. male presented to emergency department 04/12/23 with complaining of right-sided weakness; CTA head which showed left MCA territory infarction along with ICA severe stenosis as well as occlusion of the left vertebral artery. MRI multiple acute infarcts L MCA and PCA territories, old R frontal infarct PMH significant of CAD status post CABG, hypertension, insulin-dependent DM type II, hyperlipidemia, aspirin allergy, chronic alcohol use, and obesity   PRECAUTIONS: Fall   PAIN:  Are you having pain? Yes: NPRS scale: 7/10 Pain location: back Pain description: aching Aggravating factors: standing Relieving factors: rest, heat , OT will not address directly as pt is seeing PT for this  FALLS: Has patient fallen in last 6 months? No  LIVING ENVIRONMENT: Lives with: lives alone Lives in: House/apartment Stairs: No Has following equipment at home: Environmental consultant - 4 wheeled, Wheelchair (manual), and shower chair  PLOF: Independent  PATIENT GOALS: improve use of right hand   OBJECTIVE:  Note: Objective measures were completed at Evaluation unless otherwise noted.  HAND DOMINANCE: Right  ADLs: Overall ADLs: increased time required and difficulty using R dominant hand due to weakness and incoordination Transfers/ambulation related to  ADLs: Eating: difficulty feeding self with RUE, and cutting food  Grooming: difficulty shaving pt cut himself UB Dressing: mod I LB Dressing: mod I Toileting: mod i Bathing: mod I  Tub Shower transfers: mod I Equipment: Shower seat with back  IADLs: Shopping: does light shopping  Meal Prep: using microwave  Community mobility: mod I Medication management: keeps  up with own meds Financial management: grandson helps Handwriting: Not legible  MOBILITY STATUS: Independent       UPPER EXTREMITY ROM:    Active ROM Right eval Left eval  Shoulder flexion 125   Shoulder abduction    Shoulder adduction    Shoulder extension    Shoulder internal rotation    Shoulder external rotation    Elbow flexion    Elbow extension -10   Wrist flexion 63   Wrist extension 40   Wrist ulnar deviation    Wrist radial deviation    Wrist pronation    Wrist supination    (Blank rows = not tested)   HAND FUNCTION: Grip strength: Right: 40 lbs; Left: 75 lbs  COORDINATION: 9 Hole Peg test: Right: 1 min 45 sec; Left: 33.34 sec   SENSATION: Mild numbness in RUE  EDEMA: mild edema in RUE    COGNITION: Overall cognitive  status:  to be further assessed in a functional context  VISION: Subjective report: Pt reports he got new glasses but they are not working as well. Baseline vision: Wears glasses for reading only   VISION ASSESSMENT: Visual Fields: per hospital notes right visual field deficits, per gross assessment no significant deficit noted will assess in functional context number cancellation: 2/44 missed Pt was cautioned to exercise caution and double check on the right side when driving, as pt reports MD cleared him to drive      OBSERVATIONS: Pleasant male with speech difficulties is agreeable to OT treatment   TODAY'S TREATMENT:                                                                                                                               DATE:05/30/23 UBE x 6 mins level 1 for conditioning Hot pack applied to back x 20 mins while perfroming cane exercises and fine motor coordination tasks. No adverse reactions Cane exercises with 3 lbs cane for shoulder flexion, chest press and biceps curls 2 sets of 10 reps, min v.c Pt was instructed in green putty exercises for grip and tip pinch 20 reps each, min v.c. Placing grooved pegs into pegboard with RUE for increased fine motor coordination, mod difficulty, removing with inhand manipulation, min difficulty/ v.c   05/23/23- Therapist started checking progress towards goals. see goals for progress. Hotpack applied to low back x 20 mins for pain relief, no adverse reactions while performing exercises.  Cane exercises with 2 lbs cane for shoulder flexion, chest pres, biceps curls 10-15 rpes each, min v.c Environmental scanning 14/15 located on first pass, pt located remaining items without v.c Copying small peg design with RUE for increased fine motor coordination, min difficulty and increased time required. Removing with in hand manipulation, min v.c  Placing graded clothespins on vertical target with RUE for sustained pinch 1-8#, min v.c for tip or 3 pt pinch..  05/16/23- Seated Using 2 lbs cane pt performed 20 reps each for shoulder flexion, chest press, and biceps curls, min v.c Hotpack applied to back x 15 mins while seated, for pain no adverse reactions. Copying small peg design with RUE for increased fine motor coordination, min-mod difficulty and increased time required. Flipping playing cards, stacking coins and manipulating in hand for increased fine motor coordiantion, min v.c   UBE x 6 mins level 1 for conditioning.    05/09/23- Cane exercises for shoulder flexion, abduction and biceps curls 10-15 reps each, min v.c  Functional reaching mid range to place large pegs in vertical pegboard with RUE, mod v.c/ mod difficulty and drops Placing and removing graded clothespins from  target with RUE for sustained pinch 1-8#, min v.c  Flipping and dealing playing cards with RUE for increased fine motor coordiantion, min v.c  UBE x 6 mins level 1 for conditioning  05/02/23 Pt was instructed in initial HEP for cane exercises in sitting and exercises for  wrist, forearm and hand and beginning coordination, min v.c and demonstration. Fit of prefab resting splint was assessed however since pt now has increased function in hand, therapist recommends pt does not wear splint so he can use hand. 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 Yes  SHORT TERM GOALS: Target date: 05/25/23  I with initial HEP  Goal status: met 05/23/23  2.  Pt will demonstrate improved fine motor coordination as evidenced by decreasing 9 hole peg test score by 5 secs from initial. Baseline: 1 min 45 secs Goal status: met 44 secs 05/23/23  3.  Pt will demonstrate understanding of edema control techniques and sensory precautions  Goal status:  met, verbalizes understanding  4.  Pt will demonstrate grossly 90% composite finger flexion with RUE for increased functional use. Baseline: 85% Goal status: met, 05/23/23  5.  Pt will report consistently feeding himself with RUE.  Goal status: met, 05/23/23  6.  I with with adaptive equipment and strategies to maximize pt's safety and I with ADLs/IADLS.  Goal status: ongoing, 05/23/23 7. Pt will perform environmental scanning with 90% or better accuracy. Goal status:met 93% 05/23/23  LONG TERM  GOALS: Target date: 07/19/23  I with updated HEP. Baseline:  Goal status:  ongoing 05/30/23  2.  Pt will demonstrate improved fine motor coordination for ADLs as evidenced by decreasing 9 hole peg test score  to 90 secs Pt will demonstrate improved RUE fine motor coordination for ADLS as evidenced by decreassing 9 hole peg test to 40 secs or less. Baseline: 1 min 45 secs, goal met 05/23/23- 44 secs Goal status: met 44 secs 05/23/23, goal upgraded see above  3.  Pt will increase RUE grip strength to 50 lbs for increased functional use. Baseline: 40 lbs Goal status: met 75 lbs 05/30/23  4.  Pt will resume use of RUE as dominant hand 50% of the time for ADLS/light IADLs.  Goal status:  ongoing 05/30/23  5.  Pt will demonstrate ability to retrieve a 3 lbs object from overhead shelf without drops x 5 reps  Goal status: Iongoing 05/30/23  6.  Pt will demonstrate ability to carry an 8 lbs bag of groceries with RUE.  Goal status:  ongoing, 05/30/23   ASSESSMENT:  CLINICAL IMPRESSION: Pt is progressing towards goals. He demonstrates improving LUE functional use and fine motor coordination. He demonstrates understanding of green putty HEP. Pt reports today that he desires to return to work in January. 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    PERFORMANCE DEFICITS: in functional skills including ADLs, IADLs, coordination, dexterity, sensation, edema, ROM, strength, pain, flexibility, Fine motor control, Gross motor control, mobility, balance, endurance, decreased knowledge of precautions, decreased knowledge of use of DME, vision, and UE functional use, cognitive skills including attention, problem solving, safety awareness, thought, and understand, and psychosocial skills including coping strategies, environmental adaptation, habits, interpersonal interactions, and routines and behaviors.   IMPAIRMENTS: are limiting  patient from ADLs, IADLs, rest and sleep, work, play, leisure, and social participation.   CO-MORBIDITIES: may have co-morbidities  that affects occupational performance. Patient will benefit from skilled OT to address above impairments and improve overall function.  MODIFICATION OR ASSISTANCE TO COMPLETE EVALUATION: No modification of tasks or assist necessary to complete an evaluation.  OT OCCUPATIONAL PROFILE AND HISTORY: Detailed assessment: Review of records and additional review of physical, cognitive, psychosocial history related to current functional performance.  CLINICAL DECISION MAKING: LOW - limited treatment options, no task modification necessary  REHAB POTENTIAL: Good  EVALUATION COMPLEXITY: Low    PLAN:  OT FREQUENCY: 1x/week plus  OT DURATION: 12 weeks (however may wrap up dependent upon progress and insurance limitations)  PLANNED INTERVENTIONS: 60454 OT Re-evaluation, 97535 self care/ADL training, 09811 therapeutic exercise, 97530 therapeutic activity, 97112 neuromuscular re-education, 97140 manual therapy, 97113 aquatic therapy, 97035 ultrasound, 97018 paraffin, 91478 moist heat, 97010 cryotherapy, 97129 Cognitive training (first 15 min), 29562 Cognitive training(each additional 15 min), 13086 Splinting (initial encounter), passive range of motion, functional mobility training, visual/perceptual remediation/compensation, psychosocial skills training, energy conservation, coping strategies training, patient/family education, and DME and/or AE instructions  RECOMMENDED OTHER SERVICES: ST  CONSULTED AND AGREED WITH PLAN OF CARE: Patient  PLAN FOR NEXT SESSION: consider simulated work activities, pt may need an FCE   Valinda Fedie, OT 05/30/2023, 10:22 AM

## 2023-05-30 NOTE — Therapy (Signed)
OUTPATIENT SPEECH LANGUAGE PATHOLOGY TREATMENT    Patient Name: Dennis Hopkins MRN: [id number] DOB:1961-06-13, 62 y.o., male Today's Date: 05/30/2023  PCP: Donato Schultz, DO REFERRING PROVIDER: Donato Schultz, DO  END OF SESSION:  End of Session - 05/30/23 0933     Visit Number 5    Number of Visits 17    Date for SLP Re-Evaluation 07/03/23    SLP Start Time 0930    SLP Stop Time  1010    SLP Time Calculation (min) 40 min    Activity Tolerance Patient tolerated treatment well             Past Medical History:  Diagnosis Date   Acute CVA (cerebrovascular accident) (HCC) 04/12/2023   Acute left-sided low back pain with right-sided sciatica 02/14/2019   Calcified granuloma of lung 09/06/2021   Chest pain 11/11/2015   Chickenpox    Chronic alcohol use 04/12/2023   Coronary artery disease 01/12/2016   Diabetes (HCC) 01/18/2016   Type 2     Diabetes mellitus (HCC) 04/13/2021   Eczema 04/12/2022   Essential hypertension    Exertional angina (HCC)    History of CAD (coronary artery disease) 04/12/2023   HTN (hypertension) 10/20/2015   Hyperlipemia    Hyperlipidemia    Hyperlipidemia LDL goal <70 10/20/2015   Hypokalemia 04/12/2023   Insulin dependent type 2 diabetes mellitus (HCC) 07/19/2016   Kidney stones 06/21/2011   Lumbar foraminal stenosis 01/05/2022   Morbid obesity (HCC) 04/12/2023   Obesity (BMI 30-39.9) 09/26/2013   Peripheral neuropathic pain 09/06/2021   Peripheral neuropathy 04/12/2023   Preventative health care 09/29/2014   Rash 08/24/2020   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   Snoring 08/24/2020   Tobacco use disorder 09/26/2013   Type 2 diabetes mellitus with diabetic peripheral angiopathy without gangrene, without long-term current use of insulin (HCC) 09/06/2021   Type 2 diabetes mellitus with hyperglycemia, without long-term current use of insulin (HCC) 04/13/2021   Urinary frequency 08/24/2020   Past Surgical History:  Procedure  Laterality Date   CARDIAC CATHETERIZATION N/A 01/07/2016   Procedure: Left Heart Cath and Coronary Angiography;  Surgeon: Corky Crafts, MD;  Location: [facility] INVASIVE CV LAB;  Service: Cardiovascular;  Laterality: N/A;   CORONARY ARTERY BYPASS GRAFT N/A 01/13/2016   Procedure: CORONARY ARTERY BYPASS GRAFTING (CABG) x4 Endoscopic Harvesting of the Right Greater Saphenous Vein;  Surgeon: Loreli Slot, MD;  Location: [facility] OR;  Service: Open Heart Surgery;  Laterality: N/A;   TEE WITHOUT CARDIOVERSION N/A 01/13/2016   Procedure: TRANSESOPHAGEAL ECHOCARDIOGRAM (TEE);  Surgeon: Loreli Slot, MD;  Location: [facility] OR;  Service: Open Heart Surgery;  Laterality: N/A;   TONSILLECTOMY     Patient Active Problem List   Diagnosis Date Noted   Hyperlipemia    Ischemic cerebrovascular accident (CVA) (HCC) 04/12/2023   History of CAD (coronary artery disease) 04/12/2023   Chronic alcohol use 04/12/2023   Hypokalemia 04/12/2023   Morbid obesity (HCC) 04/12/2023   Peripheral neuropathy 04/12/2023   Eczema 04/12/2022   Lumbar foraminal stenosis 01/05/2022   Type 2 diabetes mellitus with diabetic peripheral angiopathy without gangrene, without long-term current use of insulin (HCC) 09/06/2021   Calcified granuloma of lung 09/06/2021   Peripheral neuropathic pain 09/06/2021   Type 2 diabetes mellitus with hyperglycemia, without long-term current use of insulin (HCC) 04/13/2021   Diabetes mellitus (HCC) 04/13/2021   Essential hypertension    Hyperlipidemia    Chickenpox    Urinary  frequency 08/24/2020   Snoring 08/24/2020   Rash 08/24/2020   Acute left-sided low back pain with right-sided sciatica 02/14/2019   Insulin dependent type 2 diabetes mellitus (HCC) 07/19/2016   Diabetes (HCC) 01/18/2016   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 SLP checked in with mood - pt is still using the breathing strategies. Reports this helps.    Pt accompanied by: self  PERTINENT HISTORY: Per chart review: medical history significant of CAD status post CABG, hypertension, insulin-dependent DM type II, hyperlipidemia, aspirin allergy, chronic alcohol use   PAIN:  Are you having pain? Yes: NPRS scale: 7/10 Pain location: lower back Pain description: throbbing  Aggravating factors: sitting Relieving factors: medicine    OBJECTIVE:   PATIENT REPORTED OUTCOME MEASURES (PROM): Communication Effectiveness Survey 19   TODAY'S TREATMENT:                                                                                                                                         DATE:    05/30/23: Pt was seen for skilled ST services targeting cognition and dysarthia. Pt reported he had a doctor's appointment that he missed. SLP facilitated session by encouraging pt to call and make an appointment. Pt was required to repeat himself by administrative assistant 2-3x in 3 minute conversation. SLP encouraged pt to slow down (using hand gestures) and this was helpful in increasing clarity of speech. SLP educated on using pausing when speaking. Pt and SLP collaborated to identify where pauses would be necessary to increase intelligibility on his functional phrases, as pt's rate of speech is extremely fast. Pt required minA verbal and visual (vertical lines for pauses in sentence) cues to use pauses.  Pt was able to recall 3 strategies for clear speech re: slow down, putting  pauses, and getting louder.      05/23/23: Pt was seen for skilled ST services targeting cognition and dysarthria. Upon entering in room, pt speech was observed to be dysarthric ~75% intelligible. Pt did not appear to be self-monitoring his speech. SLP challenged pt cognition with pill box task. Pt currently manages his own medications, but does not have a pill box.  Patient completed medication management task with 100% accuracy.  Patient reports difficulty remembering to pay bills on time.  SLP developed a bill due date sheet.  Patient had some difficulty generating/retrieving names of  the companies his bills are from.  With extra time, patient was able to complete.  He reports this happens occasionally.  Throughout today's session, patient required constant cueing to increase intelligibility.  SLP cued patient with " be clear".  To continue.   05/16/23:    50-word intelligibility test: 40/50  Pt reported he turned the TV, shower, and breathing before bed. He reported it helped a little bit. He reported he did fall asleep faster using the breathing exercises.   Educated on "Be CLEAR" dysarthria treatment. Provided pt with handout and practiced functional phrases with min-modA verbal feedback as needed.   1. What are you doing? 2. I have an appointment. 3. Yeah, I have. 4. *Verify appointment/date* December 1st at 3:00 5. Talk to you later. 6. I need to make a deposit. 7. [employer] - Double Cheeseburger and Large Sweet Tea 8. [employer] - Number 12 Combo 9. [employer] - Tray with BBQ sandwich, Donzetta Sprung and a Corn Dog, Coke/Sprite 10. Thank you  05/09/23: Pt was seen for skilled ST services targeting education on relaxation and speech. Pt reported he is having some anxiety at night time where he feels like he's short of breath  - concerned that he won't wake up. He reports shortness of breath is directly related to anxiety (he is not short of breath any other times). He attempts to breathe deeper  and feels like this helps.   We discussed healthy ways to keep his mind active. Pt picked items off a pre-generated list of things he likes to do (or used to like to do). He chose re: reading, talking with people at work, walking. SLP educated on and demonstrated relaxation/breathing exercises. SLP educated on the purpose of abdominal/diaphragmatic breathing for speech as well as relaxation. After educating on 4 relaxation strategies, pt reported he is most likely to try re: 4:7:8 breathing (MINUS the breath hold due to cardiovascular contraindications) and "belly breathing". SLP encouraged pt to "romanticize" his night time routine to create a more relaxing environment. Turn off TV a little earlier, dim lights, read before bed, take shower, and begin relaxation exercise as soon as he is in bed.   Briefly educated on speech strategy re: increased loudness to increase intelligibility. To begin speech strategy education next session. *AND ASSESS MEDICATION MANAGEMENT IN UPCOMING SESSIONS  PATIENT EDUCATION: Education details: Cognition, dysarthria, SLP role Person educated: Patient Education method: Explanation Education comprehension: needs further education   GOALS: Goals reviewed with patient? Yes  SHORT TERM GOALS: Target date: 06/03/23  Pt will recall 3 strategies to improve intelligibility with minA verbal cues. Baseline: Goal status: INITIAL  2.  Pt will recall 3 strategies to improve memory of important information. Baseline:  Goal status: INITIAL  3.  Pt will recall 2+ organization strategies to assist with managing at home tasks. Baseline:  Goal status: INITIAL    LONG TERM GOALS: Target date: 07/03/22  Pt will demonstrate intelligible speech by utilizing learned compensatory strategies in unstructured conversation.  Baseline:  Goal status: INITIAL  2.  Pt will improve score on PROMs.  Baseline:  Goal status: INITIAL  3.  Pt will report improved management of  bills/home tasks.  Baseline:  Goal status: INITIAL    ASSESSMENT:  CLINICAL IMPRESSION: Pt is a 62  yo male  who presents to ST OP for evaluation post CVA. See treatment note. SLP rec skilled ST services to address dysarthria and cognitive-communication impairment.    OBJECTIVE IMPAIRMENTS: include memory and dysarthria. These impairments  are limiting patient from managing medications, managing appointments, managing finances, and effectively communicating at home and in community. Factors affecting potential to achieve goals and functional outcome are  NA . Patient will benefit from skilled SLP services to address above impairments and improve overall function.  REHAB POTENTIAL: Good  PLAN:  SLP FREQUENCY: 1-2x/week  SLP DURATION: 8 weeks  PLANNED INTERVENTIONS: Environmental controls, Cueing hierachy, Internal/external aids, Functional tasks, SLP instruction and feedback, Compensatory strategies, and Patient/family education    [employer], CCC-SLP 05/30/2023, 9:34 AM

## 2023-05-30 NOTE — Patient Instructions (Signed)
1. Grip Strengthening (Resistive Putty)   Squeeze putty using thumb and all fingers. Repeat _20___ times. Do __2__ sessions per day.   Finger / Thumb Activities: Extension    Roll putty into rope shape using all fingers held straight. Hitchhike with thumb up and out.  Copyright  VHI. All rights reserved.    Pinch: Palmar    Pinch putty with right thumb and each fingertip in turn. Repeat _10-20___ times. Do _1-2___ sessions per day. Activity: Peel fruit such as lemons or oranges.* Peel stickers off surfaces.  Copyright  VHI. All rights reserved.       Copyright  VHI. All rights reserved.

## 2023-05-30 NOTE — Therapy (Signed)
OUTPATIENT PHYSICAL THERAPY  TREATMENT/PROGRESS NOTE AND RECERT    Patient Name: Dennis Hopkins MRN: [id number] DOB:June 25, 1960, 62 y.o., male Today's Date: 05/30/2023  END OF SESSION:  PT End of Session - 05/30/23 1103     Visit Number 9    Date for PT Re-Evaluation 06/27/23    PT Start Time 1104    PT Stop Time 1145    PT Time Calculation (min) 41 min    Activity Tolerance Patient tolerated treatment well    Behavior During Therapy [facility] for tasks assessed/performed                     Past Medical History:  Diagnosis Date   Acute CVA (cerebrovascular accident) (HCC) 04/12/2023   Acute left-sided low back pain with right-sided sciatica 02/14/2019   Calcified granuloma of lung 09/06/2021   Chest pain 11/11/2015   Chickenpox    Chronic alcohol use 04/12/2023   Coronary artery disease 01/12/2016   Diabetes (HCC) 01/18/2016   Type 2     Diabetes mellitus (HCC) 04/13/2021   Eczema 04/12/2022   Essential hypertension    Exertional angina (HCC)    History of CAD (coronary artery disease) 04/12/2023   HTN (hypertension) 10/20/2015   Hyperlipemia    Hyperlipidemia    Hyperlipidemia LDL goal <70 10/20/2015   Hypokalemia 04/12/2023   Insulin dependent type 2 diabetes mellitus (HCC) 07/19/2016   Kidney stones 06/21/2011   Lumbar foraminal stenosis 01/05/2022   Morbid obesity (HCC) 04/12/2023   Obesity (BMI 30-39.9) 09/26/2013   Peripheral neuropathic pain 09/06/2021   Peripheral neuropathy 04/12/2023   Preventative health care 09/29/2014   Rash 08/24/2020   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   Snoring 08/24/2020   Tobacco use disorder 09/26/2013   Type 2 diabetes mellitus with diabetic peripheral angiopathy without gangrene, without long-term current use of insulin (HCC) 09/06/2021   Type 2 diabetes mellitus with hyperglycemia, without long-term current use of insulin (HCC) 04/13/2021   Urinary frequency 08/24/2020   Past Surgical History:  Procedure Laterality Date    CARDIAC CATHETERIZATION N/A 01/07/2016   Procedure: Left Heart Cath and Coronary Angiography;  Surgeon: Corky Crafts, MD;  Location: [facility] INVASIVE CV LAB;  Service: Cardiovascular;  Laterality: N/A;   CORONARY ARTERY BYPASS GRAFT N/A 01/13/2016   Procedure: CORONARY ARTERY BYPASS GRAFTING (CABG) x4 Endoscopic Harvesting of the Right Greater Saphenous Vein;  Surgeon: Loreli Slot, MD;  Location: [facility] OR;  Service: Open Heart Surgery;  Laterality: N/A;   TEE WITHOUT CARDIOVERSION N/A 01/13/2016   Procedure: TRANSESOPHAGEAL ECHOCARDIOGRAM (TEE);  Surgeon: Loreli Slot, MD;  Location: [facility] OR;  Service: Open Heart Surgery;  Laterality: N/A;   TONSILLECTOMY     Patient Active Problem List   Diagnosis Date Noted   Hyperlipemia    Ischemic cerebrovascular accident (CVA) (HCC) 04/12/2023   History of CAD (coronary artery disease) 04/12/2023   Chronic alcohol use 04/12/2023   Hypokalemia 04/12/2023   Morbid obesity (HCC) 04/12/2023   Peripheral neuropathy 04/12/2023   Eczema 04/12/2022   Lumbar foraminal stenosis 01/05/2022   Type 2 diabetes mellitus with diabetic peripheral angiopathy without gangrene, without long-term current use of insulin (HCC) 09/06/2021   Calcified granuloma of lung 09/06/2021   Peripheral neuropathic pain 09/06/2021   Type 2 diabetes mellitus with hyperglycemia, without long-term current use of insulin (HCC) 04/13/2021   Diabetes mellitus (HCC) 04/13/2021   Essential hypertension    Hyperlipidemia    Chickenpox    Urinary  frequency 08/24/2020   Snoring 08/24/2020   Rash 08/24/2020   Acute left-sided low back pain with right-sided sciatica 02/14/2019   Insulin dependent type 2 diabetes mellitus (HCC) 07/19/2016   Diabetes (HCC) 01/18/2016   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   CAD, s/p CABG in 2017 [LIMA-LAD, SVG-OM1, SVG-RPDA] 01/12/2016   Exertional angina (HCC)    Chest pain 11/11/2015   HTN (hypertension) 10/20/2015   Hyperlipidemia LDL goal <70 10/20/2015    Preventative health care 09/29/2014   Obesity (BMI 30-39.9) 09/26/2013   Tobacco use disorder 09/26/2013    PCP: Donato Schultz, DO   REFERRING PROVIDER: Donato Schultz, DO   REFERRING DIAG: [phone] (ICD-10-CM) - Lumbar foraminal stenosis   THERAPY DIAG:  Abnormal posture  Muscle weakness (generalized)  Chronic bilateral low back pain with left-sided sciatica  RATIONALE FOR EVALUATION AND TREATMENT: Rehabilitation  ONSET DATE: ~1 year   NEXT MD VISIT: PRN - none scheduled   SUBJECTIVE:                                                                                                                                                                                                         SUBJECTIVE STATEMENT: Doing all right, did have some pain, it got better since doing the hot pack  PAIN: Are you having pain? Yes: NPRS scale: 5/10 Pain location: B low back, L>R Pain description: sharp Aggravating factors: sitting in unsupported position Relieving factors: heat, back support, meds   PERTINENT HISTORY:  HTN; DM-II; CA s/p CABG x 4 - 2017; Peripheral neuropathic pain; Obesity  PRECAUTIONS: None  RED FLAGS: None  WEIGHT BEARING RESTRICTIONS: No  FALLS:  Has patient fallen in last 6 months? 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   Alignment:  Facet mediated anterolisthesis of L5 on S1 of 3 mm.   Vertebrae: No fracture, suspicious marrow lesion, or significant marrow edema. Hemangioma  in the L5 vertebral body. Small L5 superior endplate Schmorl's node. Multilevel Modic type 2 endplate changes.   Conus medullaris and cauda equina: Conus extends to the L1 level. Conus and cauda equina appear normal.  Paraspinal and other soft tissues: Unremarkable.   Disc levels:   Mild diffuse lumbar disc desiccation. Moderate disc space narrowing at L3-4 and L4-5.   T12-L1: Mild disc bulging without stenosis.   L1-2: Disc bulging without stenosis.   L2-3: Disc bulging and mild facet hypertrophy without stenosis.   L3-4: Disc bulging results in borderline to mild bilateral lateral recess stenosis and mild-to-moderate bilateral neural foraminal stenosis without significant spinal stenosis.   L4-5: Disc bulging results in borderline to mild bilateral lateral recess stenosis and moderate bilateral neural foraminal stenosis without significant spinal stenosis.   L5-S1: Anterolisthesis with bulging uncovered disc and moderate right and severe left facet hypertrophy result in moderate bilateral neural foraminal stenosis without spinal stenosis.   IMPRESSION: 1. Multilevel lumbar disc and facet degeneration with moderate neural foraminal stenosis at L4-5 and L5-S1. 2. No significant spinal stenosis.  11/02/21 - Lumbar spine x-ray: FINDINGS: There is no evidence for lumbar spine fracture. Spinal alignment is normal. There is mild-to-moderate disc space narrowing at L3-L4 and L4-L5, mildly progressed. Mild disc space narrowing at L5-S1 appears stable. Endplate osteophytes are seen throughout. There is facet arthropathy at L5-S1. Soft tissues are within normal limits.   IMPRESSION: 1. No acute fracture or malalignment. 2. Moderate multilevel degenerative changes throughout the spine have mildly progressed compared to 2020.  PATIENT SURVEYS:  Modified Oswestry 18 / 50 = 36.0 %; 05/16/23 22/50  FOTO lumbar spine 52, predicted 60  SCREENING FOR RED FLAGS: Bowel or bladder incontinence:  No Spinal tumors: No Cauda equina syndrome: No Compression fracture: No Abdominal aneurysm: No  COGNITION:  Overall cognitive status: Within functional limits for tasks assessed    SENSATION: Radicular pain down L>R LE to calves B diabetic peripheral neuropathy in B feet  MUSCLE LENGTH: Hamstrings: mod/severe tight L>R ITB: mod/severe tight L>R Piriformis: mod/severe tight L>R Hip flexors: mod tight B Quads: mod tight B Heelcord: NT  POSTURE:  rounded shoulders, forward head, decreased lumbar lordosis, and flexed trunk   PALPATION: Increased muscle tension in bilateral lumbar paraspinals and upper glutes, however patient denies TTP; 05/16/23 tight and sore lumbar region   LUMBAR ROM:   Active  03/28/23 - Eval 05/16/23  Flexion Hands to ankles - HS tightness Mild limitation, HS tightness   Extension 25% limited Mild limitation   Right lateral flexion Hand to fibular head ^ WNL   Left lateral flexion Hand to lateral calf WNL   Right rotation 10% limited   Left rotation 40% limited   (Blank rows = not tested, ^ = increased pain)  LOWER EXTREMITY ROM:    Limited B hip extension and ER   LOWER EXTREMITY MMT:    MMT Right eval Left eval R 04/20/23 L 04/20/23 R 05/16/23 L 05/16/23  Hip flexion 5 4 5  4+    Hip extension 4 4+ 4+ 4+    Hip abduction 4 4 4+ 4+ 5 5  Hip adduction 4+ 4+ 4+ 4+    Hip internal rotation 5 5 5 5     Hip external rotation 5 5 5 5     Knee flexion 5 5 5 5     Knee extension 5 5 5 5     Ankle dorsiflexion 5 4+ 5 5    Ankle plantarflexion 5 5 5 5     Ankle inversion        Ankle eversion         (Blank rows = not tested)  LUMBAR SPECIAL TESTS:  Straight leg raise test:  Positive and Slump test: Positive - bilateral  GAIT: Distance walked: clinic distances Assistive device utilized: None Level of assistance: Complete Independence Gait pattern: step through pattern and trunk flexed   TODAY'S TREATMENT:  05/30/23 Bike L3 x49mins   Seated row  35# 2x10  Lat pulls 35# 2x10  Black tb ext 2x10 Functional box lifts and carry 15lb S2S OHP yellow ball 2x10 Bridges LE on Pball bridges, oblq, K2C Passive HS and K2C stretch   05/23/23 Bike L3 x20mins   Standing shoulder extension 10# 2x10  Seated row 35# 2x10  Lat pulls 35# 2x10  Black tb ext 2x10 Prone press up on elbows 2x10 Prone hip extension 2x10 MH and e-stim 10 mins   05/16/23  Modified Oswestry, objective testing and care planning  TherEx  On MHP for pain control lumbar spine x12 minutes:  HS stretches 2x30 seconds B Piriformis stretches 2x30 seconds B  SKTC 5x10 seconds B   QL stretch 3 way x30 seconds each  Nustep L4--5 x6 minutes BLEs  Standing lumbar extensions x10 some improvement in pain    05/09/23 Bike L3 x24mins  Seated row 35# 2x10 Lat pull down 35# 2x10 Shoulder ext 10# 2x10 Leg ext 10# 2x10 HS curls 25# 2x10 Leg press 20# 2x10 STS with OHP 2x10 Supine passive stretching HS, SKTC, DKTC, trunk rotations, figure 4     04/26/23 NuStep L5x33mins  STS 2x10 Supine stretching HS, single knee to chest  Feet on ball rotations, small bridges, knees to chest x10 blackTB extension 2x10 Seated row 25# 2x10 Shoulder extension 5# x10, 10# x10    04/20/23  Re-Eval - post-hospitalization for acute CVA LE MMT unchanged or improved  Gait pattern unchanged with no LOB Review of acute care PT/OT/SLP assessment - no indication for f/u PT related to CVA, but OP OT and SLP recommended  THERAPEUTIC EXERCISE: to improve flexibility, strength and mobility.  Demonstration, verbal and tactile cues throughout for technique. Rec Bike - L2 x 6 min Standing TrA + GTB scap retraction + row 10 x 5" Standing TrA + GTB scap retraction + B shoulder extension 10 x 5" Quadruped over peanut ball: Alt hip extension 10 x 3" Alt UE raise 10 x 3" Prone press-up 10 x 3" Bird dog opp UE/LE raise 10 x 3" Quadruped cat/cow (w/o peanut ball) x 10   04/10/23  THERAPEUTIC  EXERCISE: to improve flexibility, strength and mobility.  Demonstration, verbal and tactile cues throughout for technique. Rec Bike - L2 x 6 min POE x 1 min POE + alt fwd/OH reach x 10 Quadruped cat/cow x 10 Quad rocking from child's pose to prone press-up x 10  MANUAL THERAPY: To promote normalized muscle tension, improved flexibility, and reduced pain. Skilled palpation and monitoring of soft tissue during DN Trigger Point Dry-Needling  Treatment instructions: Expect mild to moderate muscle soreness. Patient verbalized understanding of these instructions and education. Patient Consent Given: Yes Education handout provided: Yes Muscles treated: B QL, lumbar erector spinae and multifidi Electrical stimulation performed: No Parameters: N/A Treatment response/outcome: Twitch Response Elicited and Palpable Increase in Muscle Length STM/DTM, manual TPR and pin & stretch to muscles addressed with DN   04/04/23 Manual Therapy: to decrease muscle spasm, pain and improve mobility.  STM to bil thoraco-lumbar PS, glutes, QL and IASTM with foam roll  Therapeutic Exercise: to improve strength and mobility.  Demo, verbal and tactile cues throughout for technique.  SKTC stretch x 30" bil LTR 3x10" both ways Brief review of  remainder of HEP Hooklying pelvic tils 10x3" Hooklying marches with core brace x 10 bil Bridges x 10 with TrA   03/28/23 - Eval  THERAPEUTIC EXERCISE: to improve flexibility, strength and mobility.  Demonstration, verbal and tactile cues throughout for technique. SKTC stretch 2 x 30" Hooklying B LTR 2 x 10" POE x 1 min Prone press up x 3 Demonstrated lumbar extension options in standing but not attempted   PATIENT EDUCATION:  Education details: HEP progression - quadruped cat/cow, continue with current HEP, postural awareness, options for OP OT & SLP at [facility], and need for D/C from OP PT if he were to start [facility] services Person educated:  Patient Education method: Explanation, Demonstration, and Handouts Education comprehension: verbalized understanding and needs further education  HOME EXERCISE PROGRAM: Access Code: [email or web address] URL: [email or web address] Date: 04/20/2023 Prepared by: Glenetta Hew  Exercises - Supine Single Knee to Chest Stretch  - 2-3 x daily - 7 x weekly - 3 reps - 30 sec hold - Supine Lower Trunk Rotation  - 2-3 x daily - 7 x weekly - 5 reps - 10 sec hold - Static Prone on Elbows  - 2-3 x daily - 7 x weekly - 2 sets - 3 reps - 30 sec hold - Standing Lumbar Extension with Counter  - 2-3 x daily - 7 x weekly - 2 sets - 10 reps - 3 sec hold - Standing Lumbar Extension at Wall - Forearms  - 2-3 x daily - 7 x weekly - 2 sets - 10 reps - 3 sec hold - Supine Posterior Pelvic Tilt  - 2-3 x daily - 7 x weekly - 2 sets - 10 reps - Supine March  - 1 x daily - 7 x weekly - 2-3 sets - 10 reps - Supine Bridge  - 1 x daily - 7 x weekly - 2-3 sets - 10 reps - Cat Cow  - 1 x daily - 7 x weekly - 2 sets - 10 reps - 5 sec hold   ASSESSMENT:  CLINICAL IMPRESSION: Pt continues to have back pain.  He continues demonstrates an extension preference so we focused on mostly extension based exercises. Pt expressed his desire to return to work in January. He stated his job mostly consist of him driving a forklift and occasionally having tor lift boxes on average of 20lb. Pt did well with box lifts using his legs and not his back, some heel elevation noted when squatting down.  Good effort throughout session. Will benefit from continuation of skilled PT services especially as he is hopeful to attempt to return to work after the new year.   OBJECTIVE IMPAIRMENTS: Abnormal gait, decreased activity tolerance, decreased endurance, decreased knowledge of condition, decreased mobility, difficulty walking, decreased ROM, decreased strength, hypomobility, increased fascial restrictions, impaired perceived functional ability,  increased muscle spasms, impaired flexibility, impaired sensation, improper body mechanics, postural dysfunction, and pain.   ACTIVITY LIMITATIONS: carrying, lifting, bending, sitting, standing, squatting, sleeping, stairs, transfers, bathing, dressing, reach over head, locomotion level, and caring for others  PARTICIPATION LIMITATIONS: meal prep, cleaning, laundry, driving, shopping, community activity, occupation, and yard work  PERSONAL FACTORS: Fitness, Past/current experiences, Profession, Time since onset of injury/illness/exacerbation, and 3+ comorbidities: HTN; DM-II; CA s/p CABG x 4 - 2017; Peripheral neuropathic pain; Obesity  are also affecting patient's functional outcome.   REHAB POTENTIAL: Good  CLINICAL DECISION MAKING: Evolving/moderate complexity  EVALUATION COMPLEXITY: Moderate   GOALS: Goals reviewed with patient? Yes  SHORT  TERM GOALS: Target date: 06/06/2023     Patient will be independent with initial HEP to improve outcomes and carryover.  Baseline: Initial HEP provided on eval Goal status: IN PROGRESS 05/16/23  2.  Patient will report centralization of radicular symptoms.  Baseline: L>R LE radicular pain down to calf Goal status: IN PROGRESS 05/16/23  LONG TERM GOALS: Target date: 06/27/2023     Patient will be independent with ongoing/advanced HEP for self-management at home.  Baseline:  Goal status: IN PROGRESS 05/16/23  2.  Patient will report 50-75% improvement in low back pain and L LE radicular pain to improve QOL.  Baseline: 5/10, up to 9/10 with L>R LE radicular pain Goal status: IN PROGRESS 05/16/23  3.  Patient to demonstrate ability to achieve and maintain good spinal alignment/posturing and body mechanics needed for daily activities. Baseline: Flexed trunk posture with rounded shoulders and forward head Goal status: IN PROGRESS 05/16/23  4.  Patient will demonstrate functional pain free lumbar ROM to perform ADLs.   Baseline: Refer to  above lumbar ROM table Goal status: IN PROGRESS 05/16/23  5.  Patient will demonstrate improved B LE strength to >/= 4+/5 for improved stability and ease of mobility . Baseline: Refer to above MMT table Goal status: MET 05/16/23  6.  Patient will report 60 on lumbar FOTO to demonstrate improved functional ability.  Baseline: 52 Goal status: IN PROGRESS  05/16/23  7. Patient will report </= 24% on Modified Oswestry to demonstrate improved functional ability with decreased pain interference. Baseline: 18 / 50 = 36.0 %  Goal status: IN PROGRESS 05/16/23  8.  Patient will report ability to resume walking for wellness program at work without limitation due to LBP or L LE radiculopathy. Baseline: Patient has been unable to walk for wellness program due to pain Goal status: IN PROGRESS 05/16/23   PLAN:  PT FREQUENCY: 1x/week   PT DURATION: 6 weeks  PLANNED INTERVENTIONS: Therapeutic exercises, Therapeutic activity, Neuromuscular re-education, Balance training, Gait training, Patient/Family education, Self Care, Joint mobilization, Stair training, Aquatic Therapy, Dry Needling, Electrical stimulation, Spinal manipulation, Spinal mobilization, Cryotherapy, Moist heat, Taping, Traction, Ultrasound, Ionotophoresis 4mg /ml Dexamethasone, Manual therapy, and Re-evaluation  PLAN FOR NEXT SESSION: progress lumbopelvic flexibility and strengthening with extension preference, leg machine. Modalities PRN   Cassie Freer, PT, DPT 05/30/23 11:04 AM

## 2023-06-06 ENCOUNTER — Ambulatory Visit: Payer: PRIVATE HEALTH INSURANCE | Admitting: Physical Therapy

## 2023-06-06 ENCOUNTER — Ambulatory Visit: Payer: PRIVATE HEALTH INSURANCE | Admitting: Occupational Therapy

## 2023-06-06 ENCOUNTER — Encounter: Payer: Self-pay | Admitting: Speech Pathology

## 2023-06-06 ENCOUNTER — Other Ambulatory Visit: Payer: Self-pay

## 2023-06-06 ENCOUNTER — Encounter: Payer: Self-pay | Admitting: Occupational Therapy

## 2023-06-06 ENCOUNTER — Ambulatory Visit: Payer: PRIVATE HEALTH INSURANCE | Admitting: Speech Pathology

## 2023-06-06 DIAGNOSIS — M6281 Muscle weakness (generalized): Secondary | ICD-10-CM

## 2023-06-06 DIAGNOSIS — R41841 Cognitive communication deficit: Secondary | ICD-10-CM | POA: Diagnosis not present

## 2023-06-06 DIAGNOSIS — R2689 Other abnormalities of gait and mobility: Secondary | ICD-10-CM

## 2023-06-06 DIAGNOSIS — M5459 Other low back pain: Secondary | ICD-10-CM

## 2023-06-06 DIAGNOSIS — R278 Other lack of coordination: Secondary | ICD-10-CM

## 2023-06-06 DIAGNOSIS — R293 Abnormal posture: Secondary | ICD-10-CM

## 2023-06-06 DIAGNOSIS — G8929 Other chronic pain: Secondary | ICD-10-CM

## 2023-06-06 DIAGNOSIS — R208 Other disturbances of skin sensation: Secondary | ICD-10-CM

## 2023-06-06 DIAGNOSIS — R471 Dysarthria and anarthria: Secondary | ICD-10-CM

## 2023-06-06 NOTE — Therapy (Unsigned)
OUTPATIENT SPEECH LANGUAGE PATHOLOGY TREATMENT    Patient Name: Dennis Hopkins MRN: [id number] DOB:12-10-1960, 62 y.o., male Today's Date: 06/06/2023  PCP: Donato Schultz, DO REFERRING PROVIDER: Donato Schultz, DO  END OF SESSION:  End of Session - 06/06/23 0937     Visit Number 6    Number of Visits 17    Date for SLP Re-Evaluation 07/03/23    SLP Start Time 0930    SLP Stop Time  1010    SLP Time Calculation (min) 40 min    Activity Tolerance Patient tolerated treatment well             Past Medical History:  Diagnosis Date   Acute CVA (cerebrovascular accident) (HCC) 04/12/2023   Acute left-sided low back pain with right-sided sciatica 02/14/2019   Calcified granuloma of lung 09/06/2021   Chest pain 11/11/2015   Chickenpox    Chronic alcohol use 04/12/2023   Coronary artery disease 01/12/2016   Diabetes (HCC) 01/18/2016   Type 2     Diabetes mellitus (HCC) 04/13/2021   Eczema 04/12/2022   Essential hypertension    Exertional angina (HCC)    History of CAD (coronary artery disease) 04/12/2023   HTN (hypertension) 10/20/2015   Hyperlipemia    Hyperlipidemia    Hyperlipidemia LDL goal <70 10/20/2015   Hypokalemia 04/12/2023   Insulin dependent type 2 diabetes mellitus (HCC) 07/19/2016   Kidney stones 06/21/2011   Lumbar foraminal stenosis 01/05/2022   Morbid obesity (HCC) 04/12/2023   Obesity (BMI 30-39.9) 09/26/2013   Peripheral neuropathic pain 09/06/2021   Peripheral neuropathy 04/12/2023   Preventative health care 09/29/2014   Rash 08/24/2020   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   Snoring 08/24/2020   Tobacco use disorder 09/26/2013   Type 2 diabetes mellitus with diabetic peripheral angiopathy without gangrene, without long-term current use of insulin (HCC) 09/06/2021   Type 2 diabetes mellitus with hyperglycemia, without long-term current use of insulin (HCC) 04/13/2021   Urinary frequency 08/24/2020   Past Surgical History:  Procedure  Laterality Date   CARDIAC CATHETERIZATION N/A 01/07/2016   Procedure: Left Heart Cath and Coronary Angiography;  Surgeon: Corky Crafts, MD;  Location: [facility] INVASIVE CV LAB;  Service: Cardiovascular;  Laterality: N/A;   CORONARY ARTERY BYPASS GRAFT N/A 01/13/2016   Procedure: CORONARY ARTERY BYPASS GRAFTING (CABG) x4 Endoscopic Harvesting of the Right Greater Saphenous Vein;  Surgeon: Loreli Slot, MD;  Location: [facility] OR;  Service: Open Heart Surgery;  Laterality: N/A;   TEE WITHOUT CARDIOVERSION N/A 01/13/2016   Procedure: TRANSESOPHAGEAL ECHOCARDIOGRAM (TEE);  Surgeon: Loreli Slot, MD;  Location: [facility] OR;  Service: Open Heart Surgery;  Laterality: N/A;   TONSILLECTOMY     Patient Active Problem List   Diagnosis Date Noted   Hyperlipemia    Ischemic cerebrovascular accident (CVA) (HCC) 04/12/2023   History of CAD (coronary artery disease) 04/12/2023   Chronic alcohol use 04/12/2023   Hypokalemia 04/12/2023   Morbid obesity (HCC) 04/12/2023   Peripheral neuropathy 04/12/2023   Eczema 04/12/2022   Lumbar foraminal stenosis 01/05/2022   Type 2 diabetes mellitus with diabetic peripheral angiopathy without gangrene, without long-term current use of insulin (HCC) 09/06/2021   Calcified granuloma of lung 09/06/2021   Peripheral neuropathic pain 09/06/2021   Type 2 diabetes mellitus with hyperglycemia, without long-term current use of insulin (HCC) 04/13/2021   Diabetes mellitus (HCC) 04/13/2021   Essential hypertension    Hyperlipidemia    Chickenpox    Urinary  frequency 08/24/2020   Snoring 08/24/2020   Rash 08/24/2020   Acute left-sided low back pain with right-sided sciatica 02/14/2019   Insulin dependent type 2 diabetes mellitus (HCC) 07/19/2016   Diabetes (HCC) 01/18/2016   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   CAD, s/p CABG in 2017 [LIMA-LAD, SVG-OM1, SVG-RPDA] 01/12/2016   Exertional angina (HCC)    Chest pain 11/11/2015   HTN (hypertension) 10/20/2015   Hyperlipidemia LDL  goal <70 10/20/2015   Preventative health care 09/29/2014   Obesity (BMI 30-39.9) 09/26/2013   Tobacco use disorder 09/26/2013    ONSET DATE: 04/12/23   REFERRING DIAG: I63.9 (ICD-10-CM) - Cerebral infarction, unspecified   THERAPY DIAG:  Dysarthria and anarthria  Cognitive communication deficit  Rationale for Evaluation and Treatment: Rehabilitation  SUBJECTIVE:   SUBJECTIVE STATEMENT: Pt reports his plan is to return to work Jan 14.   Pt accompanied by: self  PERTINENT HISTORY: Per chart review: medical history significant of CAD status post CABG, hypertension, insulin-dependent DM type II, hyperlipidemia, aspirin allergy, chronic alcohol use   PAIN:  Are you having pain? Yes: NPRS scale: 5/10 Pain location: lower back Pain description: throbbing  Aggravating factors: sitting Relieving factors: medicine    OBJECTIVE:   PATIENT REPORTED OUTCOME MEASURES (PROM): Communication Effectiveness Survey 19   TODAY'S TREATMENT:                                                                                                                                         DATE:    05/27/23: Pt was seen for skilled ST services targeting cognition and dysarthria.   Pauses, louder, slower.  05/30/23: Pt was seen for skilled ST services targeting cognition and dysarthia. Pt reported he had a doctor's appointment that he missed. SLP facilitated session by encouraging pt to call and make an appointment. Pt was required to repeat himself by administrative assistant 2-3x in 3 minute conversation. SLP encouraged pt to slow down (using hand gestures) and this was helpful in increasing clarity of speech. SLP educated on using pausing when speaking. Pt and SLP collaborated to identify where pauses would be necessary to increase intelligibility on his functional phrases, as pt's rate of speech is extremely fast. Pt required minA verbal and visual (vertical lines for pauses in sentence) cues to use  pauses.  Pt was able to recall 3 strategies for clear speech re: slow down, putting pauses, and getting louder.      05/23/23: Pt was seen for skilled ST services targeting cognition and dysarthria. Upon entering in room, pt speech was observed to be dysarthric ~75% intelligible. Pt did not appear to be self-monitoring his speech. SLP challenged pt cognition with pill box task. Pt currently manages his own medications, but does not have a pill box.  Patient completed medication management task with 100% accuracy.  Patient reports difficulty remembering to pay bills on time.  SLP developed a  bill due date sheet.  Patient had some difficulty generating/retrieving names of the companies his bills are from.  With extra time, patient was able to complete.  He reports this happens occasionally.  Throughout today's session, patient required constant cueing to increase intelligibility.  SLP cued patient with " be clear".  To continue.   05/16/23:    50-word intelligibility test: 40/50  Pt reported he turned the TV, shower, and breathing before bed. He reported it helped a little bit. He reported he did fall asleep faster using the breathing exercises.   Educated on "Be CLEAR" dysarthria treatment. Provided pt with handout and practiced functional phrases with min-modA verbal feedback as needed.   1. What are you doing? 2. I have an appointment. 3. Yeah, I have. 4. *Verify appointment/date* December 1st at 3:00 5. Talk to you later. 6. I need to make a deposit. 7. [employer] - Double Cheeseburger and Large Sweet Tea 8. [employer] - Number 12 Combo 9. [employer] - Tray with BBQ sandwich, Donzetta Sprung and a Corn Dog, Coke/Sprite 10. Thank you  05/09/23: Pt was seen for skilled ST services targeting education on relaxation and speech. Pt reported he is having some anxiety at night time where he feels like he's short of breath  - concerned that he won't wake up. He reports shortness of breath is directly related  to anxiety (he is not short of breath any other times). He attempts to breathe deeper and feels like this helps.   We discussed healthy ways to keep his mind active. Pt picked items off a pre-generated list of things he likes to do (or used to like to do). He chose re: reading, talking with people at work, walking. SLP educated on and demonstrated relaxation/breathing exercises. SLP educated on the purpose of abdominal/diaphragmatic breathing for speech as well as relaxation. After educating on 4 relaxation strategies, pt reported he is most likely to try re: 4:7:8 breathing (MINUS the breath hold due to cardiovascular contraindications) and "belly breathing". SLP encouraged pt to "romanticize" his night time routine to create a more relaxing environment. Turn off TV a little earlier, dim lights, read before bed, take shower, and begin relaxation exercise as soon as he is in bed.   Briefly educated on speech strategy re: increased loudness to increase intelligibility. To begin speech strategy education next session. *AND ASSESS MEDICATION MANAGEMENT IN UPCOMING SESSIONS  PATIENT EDUCATION: Education details: Cognition, dysarthria, SLP role Person educated: Patient Education method: Explanation Education comprehension: needs further education   GOALS: Goals reviewed with patient? Yes  SHORT TERM GOALS: Target date: 06/03/23  Pt will recall 3 strategies to improve intelligibility with minA verbal cues. Baseline: Goal status: MET  2.  Pt will recall 3 strategies to improve memory of important information. Baseline:  Goal status: NOT MET  3.  Pt will recall 2+ organization strategies to assist with managing at home tasks. Baseline:  Goal status: NOT MET    LONG TERM GOALS: Target date: 07/03/22  Pt will demonstrate intelligible speech by utilizing learned compensatory strategies in unstructured conversation.  Baseline:  Goal status: INITIAL  2.  Pt will improve score on PROMs.   Baseline:  Goal status: INITIAL  3.  Pt will report improved management of bills/home tasks.  Baseline:  Goal status: INITIAL    ASSESSMENT:  CLINICAL IMPRESSION: Pt is a 62  yo male  who presents to ST OP for evaluation post CVA. See treatment note. SLP rec skilled ST services to address dysarthria  and cognitive-communication impairment.    OBJECTIVE IMPAIRMENTS: include memory and dysarthria. These impairments are limiting patient from managing medications, managing appointments, managing finances, and effectively communicating at home and in community. Factors affecting potential to achieve goals and functional outcome are  NA . Patient will benefit from skilled SLP services to address above impairments and improve overall function.  REHAB POTENTIAL: Good  PLAN:  SLP FREQUENCY: 1-2x/week  SLP DURATION: 8 weeks  PLANNED INTERVENTIONS: Environmental controls, Cueing hierachy, Internal/external aids, Functional tasks, SLP instruction and feedback, Compensatory strategies, and Patient/family education    [employer], CCC-SLP 06/06/2023, 9:38 AM

## 2023-06-06 NOTE — Patient Instructions (Signed)
  Strengthening: Resisted Flexion   Hold tubing with RIGHT arm(s) at side. Pull forward and up. Move shoulder through pain-free range of motion. Repeat __10__ times per set.  Do _1-2_ sessions per day , every other day   Strengthening: Resisted Extension   Hold tubing in RIGHT hand(s), arm forward. Pull arm back, elbow straight. Repeat _10___ times per set. Do _1-2___ sessions per day, every other day.   Resisted Horizontal Abduction: Bilateral   Sit or stand, tubing in both hands, arms out in front. Keeping arms straight, pinch shoulder blades together and stretch arms out. Repeat _10___ times per set. Do _1-2___ sessions per day, every other day.   Elbow Flexion: Resisted   With tubing held in RIGHT  hand(s) and other end secured under foot, curl arm up as far as possible. Repeat _10___ times per set. Do _1-2___ sessions per day, every other day.    Elbow Extension: Resisted   Sit in chair with resistive band secured at armrest (or hold with other hand) and RIGHT  elbow bent. Straighten elbow. Repeat _10___ times per set.  Do _1-2___ sessions per day, every other day.   Copyright  VHI. All rights reserved.

## 2023-06-06 NOTE — Therapy (Signed)
OUTPATIENT PHYSICAL THERAPY  TREATMENT   Patient Name: Dennis Hopkins MRN: [id number] DOB:04-17-1961, 62 y.o., male Today's Date: 06/06/2023  END OF SESSION:  PT End of Session - 06/06/23 1004     Visit Number 10    Date for PT Re-Evaluation 06/27/23    PT Start Time 1100    PT Stop Time 1140    PT Time Calculation (min) 40 min    Activity Tolerance Patient tolerated treatment well    Behavior During Therapy [facility] for tasks assessed/performed             Past Medical History:  Diagnosis Date   Acute CVA (cerebrovascular accident) (HCC) 04/12/2023   Acute left-sided low back pain with right-sided sciatica 02/14/2019   Calcified granuloma of lung 09/06/2021   Chest pain 11/11/2015   Chickenpox    Chronic alcohol use 04/12/2023   Coronary artery disease 01/12/2016   Diabetes (HCC) 01/18/2016   Type 2     Diabetes mellitus (HCC) 04/13/2021   Eczema 04/12/2022   Essential hypertension    Exertional angina (HCC)    History of CAD (coronary artery disease) 04/12/2023   HTN (hypertension) 10/20/2015   Hyperlipemia    Hyperlipidemia    Hyperlipidemia LDL goal <70 10/20/2015   Hypokalemia 04/12/2023   Insulin dependent type 2 diabetes mellitus (HCC) 07/19/2016   Kidney stones 06/21/2011   Lumbar foraminal stenosis 01/05/2022   Morbid obesity (HCC) 04/12/2023   Obesity (BMI 30-39.9) 09/26/2013   Peripheral neuropathic pain 09/06/2021   Peripheral neuropathy 04/12/2023   Preventative health care 09/29/2014   Rash 08/24/2020   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   Snoring 08/24/2020   Tobacco use disorder 09/26/2013   Type 2 diabetes mellitus with diabetic peripheral angiopathy without gangrene, without long-term current use of insulin (HCC) 09/06/2021   Type 2 diabetes mellitus with hyperglycemia, without long-term current use of insulin (HCC) 04/13/2021   Urinary frequency 08/24/2020   Past Surgical History:  Procedure Laterality Date   CARDIAC CATHETERIZATION N/A 01/07/2016    Procedure: Left Heart Cath and Coronary Angiography;  Surgeon: Corky Crafts, MD;  Location: [facility] INVASIVE CV LAB;  Service: Cardiovascular;  Laterality: N/A;   CORONARY ARTERY BYPASS GRAFT N/A 01/13/2016   Procedure: CORONARY ARTERY BYPASS GRAFTING (CABG) x4 Endoscopic Harvesting of the Right Greater Saphenous Vein;  Surgeon: Loreli Slot, MD;  Location: [facility] OR;  Service: Open Heart Surgery;  Laterality: N/A;   TEE WITHOUT CARDIOVERSION N/A 01/13/2016   Procedure: TRANSESOPHAGEAL ECHOCARDIOGRAM (TEE);  Surgeon: Loreli Slot, MD;  Location: [facility] OR;  Service: Open Heart Surgery;  Laterality: N/A;   TONSILLECTOMY     Patient Active Problem List   Diagnosis Date Noted   Hyperlipemia    Ischemic cerebrovascular accident (CVA) (HCC) 04/12/2023   History of CAD (coronary artery disease) 04/12/2023   Chronic alcohol use 04/12/2023   Hypokalemia 04/12/2023   Morbid obesity (HCC) 04/12/2023   Peripheral neuropathy 04/12/2023   Eczema 04/12/2022   Lumbar foraminal stenosis 01/05/2022   Type 2 diabetes mellitus with diabetic peripheral angiopathy without gangrene, without long-term current use of insulin (HCC) 09/06/2021   Calcified granuloma of lung 09/06/2021   Peripheral neuropathic pain 09/06/2021   Type 2 diabetes mellitus with hyperglycemia, without long-term current use of insulin (HCC) 04/13/2021   Diabetes mellitus (HCC) 04/13/2021   Essential hypertension    Hyperlipidemia    Chickenpox    Urinary frequency 08/24/2020   Snoring 08/24/2020   Rash 08/24/2020  Acute left-sided low back pain with right-sided sciatica 02/14/2019   Insulin dependent type 2 diabetes mellitus (HCC) 07/19/2016   Diabetes (HCC) 01/18/2016   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   CAD, s/p CABG in 2017 [LIMA-LAD, SVG-OM1, SVG-RPDA] 01/12/2016   Exertional angina (HCC)    Chest pain 11/11/2015   HTN (hypertension) 10/20/2015   Hyperlipidemia LDL goal <70 10/20/2015   Preventative health care 09/29/2014    Obesity (BMI 30-39.9) 09/26/2013   Tobacco use disorder 09/26/2013    PCP: Donato Schultz, DO   REFERRING PROVIDER: Donato Schultz, DO   REFERRING DIAG: [id number] (ICD-10-CM) - Lumbar foraminal stenosis   THERAPY DIAG:  Abnormal posture  Muscle weakness (generalized)  Chronic bilateral low back pain with left-sided sciatica  Other lack of coordination  Other abnormalities of gait and mobility  Other low back pain  RATIONALE FOR EVALUATION AND TREATMENT: Rehabilitation  ONSET DATE: ~1 year   NEXT MD VISIT: PRN - none scheduled   SUBJECTIVE:                                                                                                                                                                                                         SUBJECTIVE STATEMENT: Pt states his back is doing okay this morning.   PAIN: Are you having pain? Yes: NPRS scale: 5/10 Pain location: B low back, L>R Pain description: sharp Aggravating factors: sitting in unsupported position Relieving factors: heat, back support, meds   PERTINENT HISTORY:  HTN; DM-II; CA s/p CABG x 4 - 2017; Peripheral neuropathic pain; Obesity  PRECAUTIONS: None  RED FLAGS: None  WEIGHT BEARING RESTRICTIONS: No  FALLS:  Has patient fallen in last 6 months? 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   Alignment:  Facet mediated anterolisthesis of L5 on S1 of 3 mm.   Vertebrae: No fracture, suspicious marrow lesion, or  significant marrow edema. Hemangioma in the L5 vertebral body. Small L5 superior endplate Schmorl's node. Multilevel Modic type 2 endplate changes.   Conus medullaris and cauda equina: Conus extends to the L1 level. Conus and cauda equina appear normal.  Paraspinal and other soft tissues: Unremarkable.   Disc levels:   Mild diffuse lumbar disc desiccation. Moderate disc space narrowing at L3-4 and L4-5.   T12-L1: Mild disc bulging without stenosis.   L1-2: Disc bulging without stenosis.   L2-3: Disc bulging and mild facet hypertrophy without stenosis.   L3-4: Disc bulging results in borderline to mild bilateral lateral recess stenosis and mild-to-moderate bilateral neural foraminal stenosis without significant spinal stenosis.   L4-5: Disc bulging results in borderline to mild bilateral lateral recess stenosis and moderate bilateral neural foraminal stenosis without significant spinal stenosis.   L5-S1: Anterolisthesis with bulging uncovered disc and moderate right and severe left facet hypertrophy result in moderate bilateral neural foraminal stenosis without spinal stenosis.   IMPRESSION: 1. Multilevel lumbar disc and facet degeneration with moderate neural foraminal stenosis at L4-5 and L5-S1. 2. No significant spinal stenosis.  11/02/21 - Lumbar spine x-ray: FINDINGS: There is no evidence for lumbar spine fracture. Spinal alignment is normal. There is mild-to-moderate disc space narrowing at L3-L4 and L4-L5, mildly progressed. Mild disc space narrowing at L5-S1 appears stable. Endplate osteophytes are seen throughout. There is facet arthropathy at L5-S1. Soft tissues are within normal limits.   IMPRESSION: 1. No acute fracture or malalignment. 2. Moderate multilevel degenerative changes throughout the spine have mildly progressed compared to 2020.  PATIENT SURVEYS:  Modified Oswestry 18 / 50 = 36.0 %; 05/16/23 22/50  FOTO lumbar spine 52, predicted 60     SENSATION: Radicular pain down L>R LE to calves B diabetic peripheral neuropathy in B feet  MUSCLE LENGTH: Hamstrings: mod/severe tight L>R ITB: mod/severe tight L>R Piriformis: mod/severe tight L>R Hip flexors: mod tight B Quads: mod tight B Heelcord: NT  POSTURE:  rounded shoulders, forward head, decreased lumbar lordosis, and flexed trunk   PALPATION: Increased muscle tension in bilateral lumbar paraspinals and upper glutes, however patient denies TTP; 05/16/23 tight and sore lumbar region   LUMBAR ROM:   Active  03/28/23 - Eval 05/16/23  Flexion Hands to ankles - HS tightness Mild limitation, HS tightness   Extension 25% limited Mild limitation   Right lateral flexion Hand to fibular head ^ WNL   Left lateral flexion Hand to lateral calf WNL   Right rotation 10% limited   Left rotation 40% limited   (Blank rows = not tested, ^ = increased pain)  LOWER EXTREMITY ROM:    Limited B hip extension and ER   LOWER EXTREMITY MMT:    MMT Right eval Left eval R 04/20/23 L 04/20/23 R 05/16/23 L 05/16/23  Hip flexion 5 4 5  4+    Hip extension 4 4+ 4+ 4+    Hip abduction 4 4 4+ 4+ 5 5  Hip adduction 4+ 4+ 4+ 4+    Hip internal rotation 5 5 5 5     Hip external rotation 5 5 5 5     Knee flexion 5 5 5 5     Knee extension 5 5 5 5     Ankle dorsiflexion 5 4+ 5 5    Ankle plantarflexion 5 5 5 5     Ankle inversion        Ankle eversion         (Blank rows = not tested)  LUMBAR SPECIAL TESTS:  Straight leg raise test: Positive and Slump test: Positive - bilateral  GAIT: Distance walked: clinic distances Assistive device utilized: None Level of assistance: Complete Independence Gait pattern: step through pattern and trunk flexed   TODAY'S TREATMENT:  06/06/23 Nustep L6 x 6 min Prone press up x10 Swimmers x10 Prone shoulder ext 2x10 Prone "T" 2x10 Forearm/feet plank 3x10" Quadruped child's pose x30" Quadruped row 10# 2x10 Deadlift 10# x10 but pt not performing  correctly and using more back Sit<>stand 10# x10 with improved hip activation Modified deadlift in staggered stance x10 Pt reported dizziness after performing this, laid supine and vitals as follows: Initially upon supine: BP: 165/77; HR: 71 BPM (states this is close to his baseline) After 5 min: BP: 141/74; HR 70 BPM (pt feeling better) Supine hamstring stretch with strap x30" R&L Supine piriformis stretch x30" R&L Supine DKTC x30" Supine trunk rotation with arms abducted x30" R&L   05/30/23 Bike L3 x56mins   Seated row 35# 2x10  Lat pulls 35# 2x10  Black tb ext 2x10 Functional box lifts and carry 15lb S2S OHP yellow ball 2x10 Bridges LE on Pball bridges, oblq, K2C Passive HS and K2C stretch   05/23/23 Bike L3 x11mins   Standing shoulder extension 10# 2x10  Seated row 35# 2x10  Lat pulls 35# 2x10  Black tb ext 2x10 Prone press up on elbows 2x10 Prone hip extension 2x10 MH and e-stim 10 mins   05/16/23  Modified Oswestry, objective testing and care planning  TherEx  On MHP for pain control lumbar spine x12 minutes:  HS stretches 2x30 seconds B Piriformis stretches 2x30 seconds B  SKTC 5x10 seconds B   QL stretch 3 way x30 seconds each  Nustep L4--5 x6 minutes BLEs  Standing lumbar extensions x10 some improvement in pain    05/09/23 Bike L3 x62mins  Seated row 35# 2x10 Lat pull down 35# 2x10 Shoulder ext 10# 2x10 Leg ext 10# 2x10 HS curls 25# 2x10 Leg press 20# 2x10 STS with OHP 2x10 Supine passive stretching HS, SKTC, DKTC, trunk rotations, figure 4     04/26/23 NuStep L5x69mins  STS 2x10 Supine stretching HS, single knee to chest  Feet on ball rotations, small bridges, knees to chest x10 blackTB extension 2x10 Seated row 25# 2x10 Shoulder extension 5# x10, 10# x10    04/20/23  Re-Eval - post-hospitalization for acute CVA LE MMT unchanged or improved  Gait pattern unchanged with no LOB Review of acute care PT/OT/SLP assessment - no indication for  f/u PT related to CVA, but OP OT and SLP recommended  THERAPEUTIC EXERCISE: to improve flexibility, strength and mobility.  Demonstration, verbal and tactile cues throughout for technique. Rec Bike - L2 x 6 min Standing TrA + GTB scap retraction + row 10 x 5" Standing TrA + GTB scap retraction + B shoulder extension 10 x 5" Quadruped over peanut ball: Alt hip extension 10 x 3" Alt UE raise 10 x 3" Prone press-up 10 x 3" Bird dog opp UE/LE raise 10 x 3" Quadruped cat/cow (w/o peanut ball) x 10   04/10/23  THERAPEUTIC EXERCISE: to improve flexibility, strength and mobility.  Demonstration, verbal and tactile cues throughout for technique. Rec Bike - L2 x 6 min POE x 1 min POE + alt fwd/OH reach x 10 Quadruped cat/cow x 10 Quad rocking from child's pose to prone press-up x 10  MANUAL THERAPY: To promote normalized muscle tension, improved flexibility, and reduced pain. Skilled palpation and monitoring of soft tissue during DN Trigger Point Dry-Needling  Treatment instructions: Expect mild to moderate muscle soreness. Patient verbalized understanding of these instructions and education. Patient Consent Given: Yes Education handout provided: Yes Muscles treated: B QL, lumbar erector spinae and multifidi Electrical stimulation  performed: No Parameters: N/A Treatment response/outcome: Twitch Response Elicited and Palpable Increase in Muscle Length STM/DTM, manual TPR and pin & stretch to muscles addressed with DN   04/04/23 Manual Therapy: to decrease muscle spasm, pain and improve mobility.  STM to bil thoraco-lumbar PS, glutes, QL and IASTM with foam roll  Therapeutic Exercise: to improve strength and mobility.  Demo, verbal and tactile cues throughout for technique.  SKTC stretch x 30" bil LTR 3x10" both ways Brief review of remainder of HEP Hooklying pelvic tils 10x3" Hooklying marches with core brace x 10 bil Bridges x 10 with TrA   03/28/23 - Eval  THERAPEUTIC EXERCISE:  to improve flexibility, strength and mobility.  Demonstration, verbal and tactile cues throughout for technique. SKTC stretch 2 x 30" Hooklying B LTR 2 x 10" POE x 1 min Prone press up x 3 Demonstrated lumbar extension options in standing but not attempted   PATIENT EDUCATION:  Education details: HEP progression - quadruped cat/cow, continue with current HEP, postural awareness, options for OP OT & SLP at [facility], and need for D/C from OP PT if he were to start [facility] services Person educated: Patient Education method: Explanation, Demonstration, and Handouts Education comprehension: verbalized understanding and needs further education  HOME EXERCISE PROGRAM: Access Code: [email or web address] URL: [email or web address] Date: 04/20/2023 Prepared by: Glenetta Hew  Exercises - Supine Single Knee to Chest Stretch  - 2-3 x daily - 7 x weekly - 3 reps - 30 sec hold - Supine Lower Trunk Rotation  - 2-3 x daily - 7 x weekly - 5 reps - 10 sec hold - Static Prone on Elbows  - 2-3 x daily - 7 x weekly - 2 sets - 3 reps - 30 sec hold - Standing Lumbar Extension with Counter  - 2-3 x daily - 7 x weekly - 2 sets - 10 reps - 3 sec hold - Standing Lumbar Extension at Wall - Forearms  - 2-3 x daily - 7 x weekly - 2 sets - 10 reps - 3 sec hold - Supine Posterior Pelvic Tilt  - 2-3 x daily - 7 x weekly - 2 sets - 10 reps - Supine March  - 1 x daily - 7 x weekly - 2-3 sets - 10 reps - Supine Bridge  - 1 x daily - 7 x weekly - 2-3 sets - 10 reps - Cat Cow  - 1 x daily - 7 x weekly - 2 sets - 10 reps - 5 sec hold   ASSESSMENT:  CLINICAL IMPRESSION: Continued with extension based exercises. Only able to tolerate 10 sec of planking. Continued mid back and hip strengthening. Worked on functional lift with focus on using his hips rather this his back. Became dizzy while performing modified deadlift - vitals elevated but decreased after supine rest.   OBJECTIVE IMPAIRMENTS: Abnormal gait,  decreased activity tolerance, decreased endurance, decreased knowledge of condition, decreased mobility, difficulty walking, decreased ROM, decreased strength, hypomobility, increased fascial restrictions, impaired perceived functional ability, increased muscle spasms, impaired flexibility, impaired sensation, improper body mechanics, postural dysfunction, and pain.   ACTIVITY LIMITATIONS: carrying, lifting, bending, sitting, standing, squatting, sleeping, stairs, transfers, bathing, dressing, reach over head, locomotion level, and caring for others  PARTICIPATION LIMITATIONS: meal prep, cleaning, laundry, driving, shopping, community activity, occupation, and yard work  PERSONAL FACTORS: Fitness, Past/current experiences, Profession, Time since onset of injury/illness/exacerbation, and 3+ comorbidities: HTN; DM-II; CA s/p CABG x 4 - 2017; Peripheral neuropathic pain; Obesity  are  also affecting patient's functional outcome.   REHAB POTENTIAL: Good  CLINICAL DECISION MAKING: Evolving/moderate complexity  EVALUATION COMPLEXITY: Moderate   GOALS: Goals reviewed with patient? Yes  SHORT TERM GOALS: Target date: 06/06/2023     Patient will be independent with initial HEP to improve outcomes and carryover.  Baseline: Initial HEP provided on eval Goal status: IN PROGRESS 05/16/23  2.  Patient will report centralization of radicular symptoms.  Baseline: L>R LE radicular pain down to calf 06/06/23: reports it doesn't go down the calf as often now Goal status: IN PROGRESS   LONG TERM GOALS: Target date: 06/27/2023     Patient will be independent with ongoing/advanced HEP for self-management at home.  Baseline:  Goal status: IN PROGRESS 05/16/23  2.  Patient will report 50-75% improvement in low back pain and L LE radicular pain to improve QOL.  Baseline: 5/10, up to 9/10 with L>R LE radicular pain Goal status: IN PROGRESS 05/16/23  3.  Patient to demonstrate ability to achieve and  maintain good spinal alignment/posturing and body mechanics needed for daily activities. Baseline: Flexed trunk posture with rounded shoulders and forward head Goal status: IN PROGRESS 05/16/23  4.  Patient will demonstrate functional pain free lumbar ROM to perform ADLs.   Baseline: Refer to above lumbar ROM table Goal status: IN PROGRESS 05/16/23  5.  Patient will demonstrate improved B LE strength to >/= 4+/5 for improved stability and ease of mobility . Baseline: Refer to above MMT table Goal status: MET 05/16/23  6.  Patient will report 60 on lumbar FOTO to demonstrate improved functional ability.  Baseline: 52 Goal status: IN PROGRESS  05/16/23  7. Patient will report </= 24% on Modified Oswestry to demonstrate improved functional ability with decreased pain interference. Baseline: 18 / 50 = 36.0 %  Goal status: IN PROGRESS 05/16/23  8.  Patient will report ability to resume walking for wellness program at work without limitation due to LBP or L LE radiculopathy. Baseline: Patient has been unable to walk for wellness program due to pain Goal status: IN PROGRESS 05/16/23   PLAN:  PT FREQUENCY: 1x/week   PT DURATION: 6 weeks  PLANNED INTERVENTIONS: Therapeutic exercises, Therapeutic activity, Neuromuscular re-education, Balance training, Gait training, Patient/Family education, Self Care, Joint mobilization, Stair training, Aquatic Therapy, Dry Needling, Electrical stimulation, Spinal manipulation, Spinal mobilization, Cryotherapy, Moist heat, Taping, Traction, Ultrasound, Ionotophoresis 4mg /ml Dexamethasone, Manual therapy, and Re-evaluation  PLAN FOR NEXT SESSION: progress lumbopelvic flexibility and strengthening with extension preference, leg machine. Modalities PRN   Priscila Bean April Ma L Gudrun Axe, PT 06/06/23 10:04 AM

## 2023-06-06 NOTE — Therapy (Signed)
OUTPATIENT OCCUPATIONAL THERAPY NEURO Treatment   Patient Name: UMUT KIZZIRE MRN: [id number] DOB:[birth year], 62 y.o., male Today's Date: 06/06/2023  PCP: Dr. Laury Axon REFERRING PROVIDER: Dr. Laury Axon  END OF SESSION:  OT End of Session - 06/06/23 1146     Visit Number 7    Number of Visits 13    Date for OT Re-Evaluation 07/19/23    Authorization Type Generic provider    Authorization - Visit Number 6    Authorization - Number of Visits 12    OT Start Time 1017    OT Stop Time 1059    OT Time Calculation (min) 42 min    Activity Tolerance Patient tolerated treatment well    Behavior During Therapy [facility] for tasks assessed/performed;Flat affect                  Past Medical History:  Diagnosis Date   Acute CVA (cerebrovascular accident) (HCC) 04/12/2023   Acute left-sided low back pain with right-sided sciatica 02/14/2019   Calcified granuloma of lung 09/06/2021   Chest pain 11/11/2015   Chickenpox    Chronic alcohol use 04/12/2023   Coronary artery disease 01/12/2016   Diabetes (HCC) 01/18/2016   Type 2     Diabetes mellitus (HCC) 04/13/2021   Eczema 04/12/2022   Essential hypertension    Exertional angina (HCC)    History of CAD (coronary artery disease) 04/12/2023   HTN (hypertension) 10/20/2015   Hyperlipemia    Hyperlipidemia    Hyperlipidemia LDL goal <70 10/20/2015   Hypokalemia 04/12/2023   Insulin dependent type 2 diabetes mellitus (HCC) 07/19/2016   Kidney stones 06/21/2011   Lumbar foraminal stenosis 01/05/2022   Morbid obesity (HCC) 04/12/2023   Obesity (BMI 30-39.9) 09/26/2013   Peripheral neuropathic pain 09/06/2021   Peripheral neuropathy 04/12/2023   Preventative health care 09/29/2014   Rash 08/24/2020   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   Snoring 08/24/2020   Tobacco use disorder 09/26/2013   Type 2 diabetes mellitus with diabetic peripheral angiopathy without gangrene, without long-term current use of insulin (HCC) 09/06/2021   Type 2 diabetes  mellitus with hyperglycemia, without long-term current use of insulin (HCC) 04/13/2021   Urinary frequency 08/24/2020   Past Surgical History:  Procedure Laterality Date   CARDIAC CATHETERIZATION N/A 01/07/2016   Procedure: Left Heart Cath and Coronary Angiography;  Surgeon: Corky Crafts, MD;  Location: [facility] INVASIVE CV LAB;  Service: Cardiovascular;  Laterality: N/A;   CORONARY ARTERY BYPASS GRAFT N/A 01/13/2016   Procedure: CORONARY ARTERY BYPASS GRAFTING (CABG) x4 Endoscopic Harvesting of the Right Greater Saphenous Vein;  Surgeon: Loreli Slot, MD;  Location: [facility] OR;  Service: Open Heart Surgery;  Laterality: N/A;   TEE WITHOUT CARDIOVERSION N/A 01/13/2016   Procedure: TRANSESOPHAGEAL ECHOCARDIOGRAM (TEE);  Surgeon: Loreli Slot, MD;  Location: [facility] OR;  Service: Open Heart Surgery;  Laterality: N/A;   TONSILLECTOMY     Patient Active Problem List   Diagnosis Date Noted   Hyperlipemia    Ischemic cerebrovascular accident (CVA) (HCC) 04/12/2023   History of CAD (coronary artery disease) 04/12/2023   Chronic alcohol use 04/12/2023   Hypokalemia 04/12/2023   Morbid obesity (HCC) 04/12/2023   Peripheral neuropathy 04/12/2023   Eczema 04/12/2022   Lumbar foraminal stenosis 01/05/2022   Type 2 diabetes mellitus with diabetic peripheral angiopathy without gangrene, without long-term current use of insulin (HCC) 09/06/2021   Calcified granuloma of lung 09/06/2021   Peripheral neuropathic pain 09/06/2021   Type 2 diabetes  mellitus with hyperglycemia, without long-term current use of insulin (HCC) 04/13/2021   Diabetes mellitus (HCC) 04/13/2021   Essential hypertension    Hyperlipidemia    Chickenpox    Urinary frequency 08/24/2020   Snoring 08/24/2020   Rash 08/24/2020   Acute left-sided low back pain with right-sided sciatica 02/14/2019   Insulin dependent type 2 diabetes mellitus (HCC) 07/19/2016   Diabetes (HCC) 01/18/2016   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   CAD, s/p CABG  in 2017 [LIMA-LAD, SVG-OM1, SVG-RPDA] 01/12/2016   Exertional angina (HCC)    Chest pain 11/11/2015   HTN (hypertension) 10/20/2015   Hyperlipidemia LDL goal <70 10/20/2015   Preventative health care 09/29/2014   Obesity (BMI 30-39.9) 09/26/2013   Tobacco use disorder 09/26/2013    ONSET DATE: 04/12/23  REFERRING DIAG:  Diagnosis  I63.9 (ICD-10-CM) - Ischemic cerebrovascular accident (CVA) (HCC)  R29.898 (ICD-10-CM) - Right hand weakness    THERAPY DIAG:  Abnormal posture  Muscle weakness (generalized)  Other lack of coordination  Other abnormalities of gait and mobility  Other disturbances of skin sensation  Rationale for Evaluation and Treatment: Rehabilitation  SUBJECTIVE:   SUBJECTIVE STATEMENT: Pt reports improved skill performance with R hand, planning to return to work.  PERTINENT HISTORY: Per chart 62 y.o. male presented to emergency department 04/12/23 with complaining of right-sided weakness; CTA head which showed left MCA territory infarction along with ICA severe stenosis as well as occlusion of the left vertebral artery. MRI multiple acute infarcts L MCA and PCA territories, old R frontal infarct PMH significant of CAD status post CABG, hypertension, insulin-dependent DM type II, hyperlipidemia, aspirin allergy, chronic alcohol use, and obesity   PRECAUTIONS: Fall   PAIN: 5/10  Are you having pain? Yes: NPRS scale: 5/10 Pain location: back Pain description: aching Aggravating factors: standing Relieving factors: rest, heat , OT will not address directly as pt is seeing PT for this  FALLS: Has patient fallen in last 6 months? No  LIVING ENVIRONMENT: Lives with: lives alone Lives in: House/apartment Stairs: No Has following equipment at home: Environmental consultant - 4 wheeled, Wheelchair (manual), and shower chair  PLOF: Independent  PATIENT GOALS: improve use of right hand   OBJECTIVE:  Note: Objective measures were completed at Evaluation unless otherwise  noted.  HAND DOMINANCE: Right  ADLs: Overall ADLs: increased time required and difficulty using R dominant hand due to weakness and incoordination Transfers/ambulation related to ADLs: Eating: difficulty feeding self with RUE, and cutting food  Grooming: difficulty shaving pt cut himself UB Dressing: mod I LB Dressing: mod I Toileting: mod i Bathing: mod I  Tub Shower transfers: mod I Equipment: Shower seat with back  IADLs: Shopping: does light shopping  Meal Prep: using microwave  Community mobility: mod I Medication management: keeps  up with own meds Financial management: grandson helps Handwriting: Not legible  MOBILITY STATUS: Independent       UPPER EXTREMITY ROM:    Active ROM Right eval Left eval  Shoulder flexion 125   Shoulder abduction    Shoulder adduction    Shoulder extension    Shoulder internal rotation    Shoulder external rotation    Elbow flexion    Elbow extension -10   Wrist flexion 63   Wrist extension 40   Wrist ulnar deviation    Wrist radial deviation    Wrist pronation    Wrist supination    (Blank rows = not tested)   HAND FUNCTION: Grip strength: Right: 40 lbs; Left:  75 lbs  COORDINATION: 9 Hole Peg test: Right: 1 min 45 sec; Left: 33.34 sec   SENSATION: Mild numbness in RUE  EDEMA: mild edema in RUE    COGNITION: Overall cognitive status:  to be further assessed in a functional context  VISION: Subjective report: Pt reports he got new glasses but they are not working as well. Baseline vision: Wears glasses for reading only   VISION ASSESSMENT: Visual Fields: per hospital notes right visual field deficits, per gross assessment no significant deficit noted will assess in functional context number cancellation: 2/44 missed Pt was cautioned to exercise caution and double check on the right side when driving, as pt reports MD cleared him to drive      OBSERVATIONS: Pleasant male with speech difficulties is  agreeable to OT treatment   TODAY'S TREATMENT:                                                                                                                              DATE: 06/06/23 Therapeutic discussion with pt regarding pt plans to return to work. 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 Reports boss could potentially review safety with forklift. Red theraband BUE strengthening 1x10 scap rows, RUE bicep curl, RUE elbow ext, RUE shoulder flex/ext with min vc's and demonstration, education on performing at home as well. Handout provided. Pt performing work simulated tasks with carrying/lifting 19# and 25# box to various heights 1x10, carrying grocery bag task with 8# approx 130ft with reports of feeling minimal discomfort in digits after carrying.  Green theraputty "cutting food" with R hand cutting to simulate feeding, no difficulty noted.  Pt performing pegged board design with R hand for [facility] with cognitive and visual component by copying design, min vc needed. Pt omitted several pegs but with questioning cues was able to self correct.  Discussion with pt about having friend from work attend next visit as she works in HR and may be able to assist with accomodations.    05/30/23 UBE x 6 mins level 1 for conditioning Hot pack applied to back x 20 mins while perfroming cane exercises and fine motor coordination tasks. No adverse reactions Cane exercises with 3 lbs cane for shoulder flexion, chest press and biceps curls 2 sets of 10 reps, min v.c Pt was instructed in green putty exercises for grip and tip pinch 20 reps each, min v.c. Placing grooved pegs into pegboard with RUE for increased fine motor coordination, mod difficulty, removing with inhand manipulation, min difficulty/ v.c   05/23/23- Therapist started checking progress towards goals. see goals for progress. Hotpack applied to low back x 20  mins for pain relief, no adverse reactions while performing exercises.  Cane exercises with 2 lbs cane for shoulder flexion, chest pres, biceps curls 10-15 rpes each, min v.c Environmental scanning 14/15 located on first pass, pt located remaining items without v.c  Copying small peg design with RUE for increased fine motor coordination, min difficulty and increased time required. Removing with in hand manipulation, min v.c  Placing graded clothespins on vertical target with RUE for sustained pinch 1-8#, min v.c for tip or 3 pt pinch..  05/16/23- Seated Using 2 lbs cane pt performed 20 reps each for shoulder flexion, chest press, and biceps curls, min v.c Hotpack applied to back x 15 mins while seated, for pain no adverse reactions. Copying small peg design with RUE for increased fine motor coordination, min-mod difficulty and increased time required. Flipping playing cards, stacking coins and manipulating in hand for increased fine motor coordiantion, min v.c   UBE x 6 mins level 1 for conditioning.    05/09/23- Cane exercises for shoulder flexion, abduction and biceps curls 10-15 reps each, min v.c  Functional reaching mid range to place large pegs in vertical pegboard with RUE, mod v.c/ mod difficulty and drops Placing and removing graded clothespins from target with RUE for sustained pinch 1-8#, min v.c  Flipping and dealing playing cards with RUE for increased fine motor coordiantion, min v.c  UBE x 6 mins level 1 for conditioning  05/02/23 Pt was instructed in initial HEP for cane exercises in sitting and exercises for wrist, forearm and hand and beginning coordination, min v.c and demonstration. Fit of prefab resting splint was assessed however since pt now has increased function in hand, therapist recommends pt does not wear splint so he can use hand. Arm bike x 6 mins level 1 for conditioning 04/26/23 -eval only  PATIENT EDUCATION: Education details: theraband HEP, red band Person  educated: Patient Education method: Verbal cues, demonstration, min v.c, handout provided  Education comprehension: verbalized understanding, returned demonstration, min v.c   HOME EXERCISE PROGRAM: Cane exercises, beginning, coordination 05/02/23 Green putty 05/30/23 red theraband 06/06/23  GOALS: Goals reviewed with patient? Yes  SHORT TERM GOALS: Target date: 05/25/23  I with initial HEP  Goal status: met 05/23/23  2.  Pt will demonstrate improved fine motor coordination as evidenced by decreasing 9 hole peg test score by 5 secs from initial. Baseline: 1 min 45 secs Goal status: met 44 secs 05/23/23  3.  Pt will demonstrate understanding of edema control techniques and sensory precautions  Goal status:  met, verbalizes understanding  4.  Pt will demonstrate grossly 90% composite finger flexion with RUE for increased functional use. Baseline: 85% Goal status: met, 05/23/23  5.  Pt will report consistently feeding himself with RUE.  Goal status: met, 05/23/23  6.  I with with adaptive equipment and strategies to maximize pt's safety and I with ADLs/IADLS.  Goal status: met, no AE needed 06/06/23 7. Pt will perform environmental scanning with 90% or better accuracy. Goal status:met 93% 05/23/23  LONG TERM GOALS: Target date: 07/19/23  I with updated HEP. Baseline:  Goal status:  ongoing 05/30/23  2.  Pt will demonstrate improved fine motor coordination for ADLs as evidenced by decreasing 9 hole peg test score  to 90 secs Pt will demonstrate improved RUE fine motor coordination for ADLS as evidenced by decreassing 9 hole peg test to 40 secs or less. Baseline: 1 min 45 secs, goal met 05/23/23- 44 secs Goal status: met 44 secs 05/23/23, goal upgraded see above  3.  Pt will increase RUE grip strength to 50 lbs for increased functional use. Baseline: 40 lbs Goal status: met 75 lbs 05/30/23  4.  Pt will resume use of RUE as dominant hand 50%  of the time for ADLS/light  IADLs.  Goal status:  ongoing 05/30/23  5.  Pt will demonstrate ability to retrieve a 3 lbs object from overhead shelf without drops x 5 reps  Goal status: ongoing 05/30/23  6.  Pt will demonstrate ability to carry an 8 lbs bag of groceries with RUE.  Goal status:  ongoing, 05/30/23   ASSESSMENT:  CLINICAL IMPRESSION: Pt is progressing towards goals. He demonstrates improving RUE functional use and fine motor coordination. He demonstrates understanding of red theraband HEP for RUE strengthening. Pt reports today that he is continuing with plans to return to work in January. 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 Note pt's RUE coordination and strength demonstrating significant improvement but continues to have deficits - plan to follow up regarding plans to go back to work.      PERFORMANCE DEFICITS: in functional skills including ADLs, IADLs, coordination, dexterity, sensation, edema, ROM, strength, pain, flexibility, Fine motor control, Gross motor control, mobility, balance, endurance, decreased knowledge of precautions, decreased knowledge of use of DME, vision, and UE functional use, cognitive skills including attention, problem solving, safety awareness, thought, and understand, and psychosocial skills including coping strategies, environmental adaptation, habits, interpersonal interactions, and routines and behaviors.   IMPAIRMENTS: are limiting patient from ADLs, IADLs, rest and sleep, work, play, leisure, and social participation.   CO-MORBIDITIES: may have co-morbidities  that affects occupational performance. Patient will benefit from skilled OT to address above impairments and improve overall function.  MODIFICATION OR ASSISTANCE TO COMPLETE EVALUATION: No modification of tasks or assist necessary to complete an evaluation.  OT OCCUPATIONAL  PROFILE AND HISTORY: Detailed assessment: Review of records and additional review of physical, cognitive, psychosocial history related to current functional performance.  CLINICAL DECISION MAKING: LOW - limited treatment options, no task modification necessary  REHAB POTENTIAL: Good  EVALUATION COMPLEXITY: Low    PLAN:  OT FREQUENCY: 1x/week plus  OT DURATION: 12 weeks (however may wrap up dependent upon progress and insurance limitations)  PLANNED INTERVENTIONS: 29528 OT Re-evaluation, 97535 self care/ADL training, 41324 therapeutic exercise, 97530 therapeutic activity, 97112 neuromuscular re-education, 97140 manual therapy, 97113 aquatic therapy, 97035 ultrasound, 97018 paraffin, 40102 moist heat, 97010 cryotherapy, 97129 Cognitive training (first 15 min), 72536 Cognitive training(each additional 15 min), 64403 Splinting (initial encounter), passive range of motion, functional mobility training, visual/perceptual remediation/compensation, psychosocial skills training, energy conservation, coping strategies training, patient/family education, and DME and/or AE instructions  RECOMMENDED OTHER SERVICES: ST  CONSULTED AND AGREED WITH PLAN OF CARE: Patient  PLAN FOR NEXT SESSION: consider simulated work activities for return to work, continue [employer] for RUE, RUE theraband    Johnney Scarlata, OT 06/06/2023, 11:47 AM

## 2023-06-07 ENCOUNTER — Encounter: Payer: Self-pay | Admitting: Speech Pathology

## 2023-06-19 ENCOUNTER — Encounter: Payer: Self-pay | Admitting: Speech Pathology

## 2023-06-19 ENCOUNTER — Ambulatory Visit: Payer: PRIVATE HEALTH INSURANCE | Admitting: Occupational Therapy

## 2023-06-19 ENCOUNTER — Ambulatory Visit: Payer: PRIVATE HEALTH INSURANCE | Admitting: Speech Pathology

## 2023-06-19 ENCOUNTER — Ambulatory Visit: Payer: PRIVATE HEALTH INSURANCE | Admitting: Physical Therapy

## 2023-06-19 ENCOUNTER — Encounter: Payer: Self-pay | Admitting: Physical Therapy

## 2023-06-19 DIAGNOSIS — R278 Other lack of coordination: Secondary | ICD-10-CM

## 2023-06-19 DIAGNOSIS — M5459 Other low back pain: Secondary | ICD-10-CM

## 2023-06-19 DIAGNOSIS — R293 Abnormal posture: Secondary | ICD-10-CM

## 2023-06-19 DIAGNOSIS — R208 Other disturbances of skin sensation: Secondary | ICD-10-CM

## 2023-06-19 DIAGNOSIS — M6281 Muscle weakness (generalized): Secondary | ICD-10-CM

## 2023-06-19 DIAGNOSIS — R471 Dysarthria and anarthria: Secondary | ICD-10-CM

## 2023-06-19 DIAGNOSIS — R41841 Cognitive communication deficit: Secondary | ICD-10-CM | POA: Diagnosis not present

## 2023-06-19 DIAGNOSIS — R41842 Visuospatial deficit: Secondary | ICD-10-CM

## 2023-06-19 DIAGNOSIS — R2689 Other abnormalities of gait and mobility: Secondary | ICD-10-CM

## 2023-06-19 NOTE — Therapy (Signed)
OUTPATIENT PHYSICAL THERAPY  TREATMENT   Patient Name: Dennis Hopkins MRN: [id number] DOB:1961/05/07, 62 y.o., male Today's Date: 06/19/2023  END OF SESSION:  PT End of Session - 06/19/23 0926     Visit Number 11    Number of Visits 13    Date for PT Re-Evaluation 06/27/23    Authorization Type [employer]    PT Start Time 0926    PT Stop Time 1013    PT Time Calculation (min) 47 min    Activity Tolerance Patient tolerated treatment well    Behavior During Therapy [facility] for tasks assessed/performed;Flat affect             Past Medical History:  Diagnosis Date   Acute CVA (cerebrovascular accident) (HCC) 04/12/2023   Acute left-sided low back pain with right-sided sciatica 02/14/2019   Calcified granuloma of lung 09/06/2021   Chest pain 11/11/2015   Chickenpox    Chronic alcohol use 04/12/2023   Coronary artery disease 01/12/2016   Diabetes (HCC) 01/18/2016   Type 2     Diabetes mellitus (HCC) 04/13/2021   Eczema 04/12/2022   Essential hypertension    Exertional angina (HCC)    History of CAD (coronary artery disease) 04/12/2023   HTN (hypertension) 10/20/2015   Hyperlipemia    Hyperlipidemia    Hyperlipidemia LDL goal <70 10/20/2015   Hypokalemia 04/12/2023   Insulin dependent type 2 diabetes mellitus (HCC) 07/19/2016   Kidney stones 06/21/2011   Lumbar foraminal stenosis 01/05/2022   Morbid obesity (HCC) 04/12/2023   Obesity (BMI 30-39.9) 09/26/2013   Peripheral neuropathic pain 09/06/2021   Peripheral neuropathy 04/12/2023   Preventative health care 09/29/2014   Rash 08/24/2020   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   Snoring 08/24/2020   Tobacco use disorder 09/26/2013   Type 2 diabetes mellitus with diabetic peripheral angiopathy without gangrene, without long-term current use of insulin (HCC) 09/06/2021   Type 2 diabetes mellitus with hyperglycemia, without long-term current use of insulin (HCC) 04/13/2021   Urinary frequency 08/24/2020   Past Surgical History:   Procedure Laterality Date   CARDIAC CATHETERIZATION N/A 01/07/2016   Procedure: Left Heart Cath and Coronary Angiography;  Surgeon: Corky Crafts, MD;  Location: [facility] INVASIVE CV LAB;  Service: Cardiovascular;  Laterality: N/A;   CORONARY ARTERY BYPASS GRAFT N/A 01/13/2016   Procedure: CORONARY ARTERY BYPASS GRAFTING (CABG) x4 Endoscopic Harvesting of the Right Greater Saphenous Vein;  Surgeon: Loreli Slot, MD;  Location: [facility] OR;  Service: Open Heart Surgery;  Laterality: N/A;   TEE WITHOUT CARDIOVERSION N/A 01/13/2016   Procedure: TRANSESOPHAGEAL ECHOCARDIOGRAM (TEE);  Surgeon: Loreli Slot, MD;  Location: [facility] OR;  Service: Open Heart Surgery;  Laterality: N/A;   TONSILLECTOMY     Patient Active Problem List   Diagnosis Date Noted   Hyperlipemia    Ischemic cerebrovascular accident (CVA) (HCC) 04/12/2023   History of CAD (coronary artery disease) 04/12/2023   Chronic alcohol use 04/12/2023   Hypokalemia 04/12/2023   Morbid obesity (HCC) 04/12/2023   Peripheral neuropathy 04/12/2023   Eczema 04/12/2022   Lumbar foraminal stenosis 01/05/2022   Type 2 diabetes mellitus with diabetic peripheral angiopathy without gangrene, without long-term current use of insulin (HCC) 09/06/2021   Calcified granuloma of lung 09/06/2021   Peripheral neuropathic pain 09/06/2021   Type 2 diabetes mellitus with hyperglycemia, without long-term current use of insulin (HCC) 04/13/2021   Diabetes mellitus (HCC) 04/13/2021   Essential hypertension    Hyperlipidemia    Chickenpox  Urinary frequency 08/24/2020   Snoring 08/24/2020   Rash 08/24/2020   Acute left-sided low back pain with right-sided sciatica 02/14/2019   Insulin dependent type 2 diabetes mellitus (HCC) 07/19/2016   Diabetes (HCC) 01/18/2016   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   CAD, s/p CABG in 2017 [LIMA-LAD, SVG-OM1, SVG-RPDA] 01/12/2016   Exertional angina (HCC)    Chest pain 11/11/2015   HTN (hypertension) 10/20/2015    Hyperlipidemia LDL goal <70 10/20/2015   Preventative health care 09/29/2014   Obesity (BMI 30-39.9) 09/26/2013   Tobacco use disorder 09/26/2013    PCP: Donato Schultz, DO   REFERRING PROVIDER: Donato Schultz, DO   REFERRING DIAG: [phone] (ICD-10-CM) - Lumbar foraminal stenosis   THERAPY DIAG:  Abnormal posture  Muscle weakness (generalized)  Other low back pain  Other abnormalities of gait and mobility  RATIONALE FOR EVALUATION AND TREATMENT: Rehabilitation  ONSET DATE: ~1 year   NEXT MD VISIT: PRN - none scheduled   SUBJECTIVE:                                                                                                                                                                                                         SUBJECTIVE STATEMENT: Pt reports that he continues to have the back pain, worse when up and moving , reports that the last PT treatment really had him sore  PAIN: Are you having pain? Yes: NPRS scale: 5/10 Pain location: B low back, L>R Pain description: sharp Aggravating factors: sitting in unsupported position Relieving factors: heat, back support, meds   PERTINENT HISTORY:  HTN; DM-II; CA s/p CABG x 4 - 2017; Peripheral neuropathic pain; Obesity  PRECAUTIONS: None  RED FLAGS: None  WEIGHT BEARING RESTRICTIONS: No  FALLS:  Has patient fallen in last 6 months? 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   Alignment:  Facet mediated anterolisthesis of L5 on  S1 of 3 mm.   Vertebrae: No fracture, suspicious marrow lesion, or significant marrow edema. Hemangioma in the L5 vertebral body. Small L5 superior endplate Schmorl's node. Multilevel Modic type 2 endplate changes.   Conus medullaris  and cauda equina: Conus extends to the L1 level. Conus and cauda equina appear normal.   Paraspinal and other soft tissues: Unremarkable.   Disc levels:   Mild diffuse lumbar disc desiccation. Moderate disc space narrowing at L3-4 and L4-5.   T12-L1: Mild disc bulging without stenosis.   L1-2: Disc bulging without stenosis.   L2-3: Disc bulging and mild facet hypertrophy without stenosis.   L3-4: Disc bulging results in borderline to mild bilateral lateral recess stenosis and mild-to-moderate bilateral neural foraminal stenosis without significant spinal stenosis.   L4-5: Disc bulging results in borderline to mild bilateral lateral recess stenosis and moderate bilateral neural foraminal stenosis without significant spinal stenosis.   L5-S1: Anterolisthesis with bulging uncovered disc and moderate right and severe left facet hypertrophy result in moderate bilateral neural foraminal stenosis without spinal stenosis.   IMPRESSION: 1. Multilevel lumbar disc and facet degeneration with moderate neural foraminal stenosis at L4-5 and L5-S1. 2. No significant spinal stenosis.  11/02/21 - Lumbar spine x-ray: FINDINGS: There is no evidence for lumbar spine fracture. Spinal alignment is normal. There is mild-to-moderate disc space narrowing at L3-L4 and L4-L5, mildly progressed. Mild disc space narrowing at L5-S1 appears stable. Endplate osteophytes are seen throughout. There is facet arthropathy at L5-S1. Soft tissues are within normal limits.   IMPRESSION: 1. No acute fracture or malalignment. 2. Moderate multilevel degenerative changes throughout the spine have mildly progressed compared to 2020.  PATIENT SURVEYS:  Modified Oswestry 18 / 50 = 36.0 %;  05/16/23 22/50  FOTO lumbar spine 52, predicted 60    SENSATION: Radicular pain down L>R LE to calves B diabetic peripheral neuropathy in B feet  MUSCLE LENGTH: Hamstrings: mod/severe tight L>R ITB: mod/severe tight L>R Piriformis: mod/severe tight L>R Hip flexors: mod tight B Quads: mod tight B Heelcord: NT  POSTURE:  rounded shoulders, forward head, decreased lumbar lordosis, and flexed trunk   PALPATION: Increased muscle tension in bilateral lumbar paraspinals and upper glutes, however patient denies TTP; 05/16/23 tight and sore lumbar region   LUMBAR ROM:   Active  03/28/23 - Eval 05/16/23  Flexion Hands to ankles - HS tightness Mild limitation, HS tightness   Extension 25% limited Mild limitation   Right lateral flexion Hand to fibular head ^ WNL   Left lateral flexion Hand to lateral calf WNL   Right rotation 10% limited   Left rotation 40% limited   (Blank rows = not tested, ^ = increased pain)  LOWER EXTREMITY ROM:    Limited B hip extension and ER   LOWER EXTREMITY MMT:    MMT Right eval Left eval R 04/20/23 L 04/20/23 R 05/16/23 L 05/16/23  Hip flexion 5 4 5  4+    Hip extension 4 4+ 4+ 4+    Hip abduction 4 4 4+ 4+ 5 5  Hip adduction 4+ 4+ 4+ 4+    Hip internal rotation 5 5 5 5     Hip external rotation 5 5 5 5     Knee flexion 5 5 5 5     Knee extension 5 5 5 5     Ankle dorsiflexion 5 4+ 5 5    Ankle plantarflexion 5 5 5 5     Ankle inversion        Ankle eversion         (Blank rows = not tested)  LUMBAR SPECIAL TESTS:  Straight leg raise test: Positive and Slump test: Positive - bilateral  GAIT: Distance walked: clinic distances Assistive device utilized: None Level  of assistance: Complete Independence Gait pattern: step through pattern and trunk flexed   TODAY'S TREATMENT:  06/19/23 Bike level 4 x 6 minutes Seated row 35# Lats 35# 10# straight arm pulls with cues for posture and core activation 20# AR press with cues for core 10# hip  extension 5# hip abduction Tmill push 20 seconds x 3  On airex ball toss On airex eyes closed, head turns Side step on and off airex Supine feet on ball K2C, rotation, small bridge, isometric abs Passive stretch LE's  06/06/23 Nustep L6 x 6 min Prone press up x10 Swimmers x10 Prone shoulder ext 2x10 Prone "T" 2x10 Forearm/feet plank 3x10" Quadruped child's pose x30" Quadruped row 10# 2x10 Deadlift 10# x10 but pt not performing correctly and using more back Sit<>stand 10# x10 with improved hip activation Modified deadlift in staggered stance x10 Pt reported dizziness after performing this, laid supine and vitals as follows: Initially upon supine: BP: 165/77; HR: 71 BPM (states this is close to his baseline) After 5 min: BP: 141/74; HR 70 BPM (pt feeling better) Supine hamstring stretch with strap x30" R&L Supine piriformis stretch x30" R&L Supine DKTC x30" Supine trunk rotation with arms abducted x30" R&L   05/30/23 Bike L3 x67mins   Seated row 35# 2x10  Lat pulls 35# 2x10  Black tb ext 2x10 Functional box lifts and carry 15lb S2S OHP yellow ball 2x10 Bridges LE on Pball bridges, oblq, K2C Passive HS and K2C stretch   05/23/23 Bike L3 x35mins   Standing shoulder extension 10# 2x10  Seated row 35# 2x10  Lat pulls 35# 2x10  Black tb ext 2x10 Prone press up on elbows 2x10 Prone hip extension 2x10 MH and e-stim 10 mins   05/16/23  Modified Oswestry, objective testing and care planning  TherEx  On MHP for pain control lumbar spine x12 minutes:  HS stretches 2x30 seconds B Piriformis stretches 2x30 seconds B  SKTC 5x10 seconds B   QL stretch 3 way x30 seconds each  Nustep L4--5 x6 minutes BLEs  Standing lumbar extensions x10 some improvement in pain    05/09/23 Bike L3 x68mins  Seated row 35# 2x10 Lat pull down 35# 2x10 Shoulder ext 10# 2x10 Leg ext 10# 2x10 HS curls 25# 2x10 Leg press 20# 2x10 STS with OHP 2x10 Supine passive stretching HS, SKTC,  DKTC, trunk rotations, figure 4     04/26/23 NuStep L5x64mins  STS 2x10 Supine stretching HS, single knee to chest  Feet on ball rotations, small bridges, knees to chest x10 blackTB extension 2x10 Seated row 25# 2x10 Shoulder extension 5# x10, 10# x10    04/20/23  Re-Eval - post-hospitalization for acute CVA LE MMT unchanged or improved  Gait pattern unchanged with no LOB Review of acute care PT/OT/SLP assessment - no indication for f/u PT related to CVA, but OP OT and SLP recommended  THERAPEUTIC EXERCISE: to improve flexibility, strength and mobility.  Demonstration, verbal and tactile cues throughout for technique. Rec Bike - L2 x 6 min Standing TrA + GTB scap retraction + row 10 x 5" Standing TrA + GTB scap retraction + B shoulder extension 10 x 5" Quadruped over peanut ball: Alt hip extension 10 x 3" Alt UE raise 10 x 3" Prone press-up 10 x 3" Bird dog opp UE/LE raise 10 x 3" Quadruped cat/cow (w/o peanut ball) x 10   04/10/23  THERAPEUTIC EXERCISE: to improve flexibility, strength and mobility.  Demonstration, verbal and tactile cues throughout for technique. Rec Bike -  L2 x 6 min POE x 1 min POE + alt fwd/OH reach x 10 Quadruped cat/cow x 10 Quad rocking from child's pose to prone press-up x 10  MANUAL THERAPY: To promote normalized muscle tension, improved flexibility, and reduced pain. Skilled palpation and monitoring of soft tissue during DN Trigger Point Dry-Needling  Treatment instructions: Expect mild to moderate muscle soreness. Patient verbalized understanding of these instructions and education. Patient Consent Given: Yes Education handout provided: Yes Muscles treated: B QL, lumbar erector spinae and multifidi Electrical stimulation performed: No Parameters: N/A Treatment response/outcome: Twitch Response Elicited and Palpable Increase in Muscle Length STM/DTM, manual TPR and pin & stretch to muscles addressed with DN   04/04/23 Manual Therapy: to  decrease muscle spasm, pain and improve mobility.  STM to bil thoraco-lumbar PS, glutes, QL and IASTM with foam roll  Therapeutic Exercise: to improve strength and mobility.  Demo, verbal and tactile cues throughout for technique.  SKTC stretch x 30" bil LTR 3x10" both ways Brief review of remainder of HEP Hooklying pelvic tils 10x3" Hooklying marches with core brace x 10 bil Bridges x 10 with TrA   03/28/23 - Eval  THERAPEUTIC EXERCISE: to improve flexibility, strength and mobility.  Demonstration, verbal and tactile cues throughout for technique. SKTC stretch 2 x 30" Hooklying B LTR 2 x 10" POE x 1 min Prone press up x 3 Demonstrated lumbar extension options in standing but not attempted   PATIENT EDUCATION:  Education details: HEP progression - quadruped cat/cow, continue with current HEP, postural awareness, options for OP OT & SLP at [facility], and need for D/C from OP PT if he were to start [facility] services Person educated: Patient Education method: Explanation, Demonstration, and Handouts Education comprehension: verbalized understanding and needs further education  HOME EXERCISE PROGRAM: Access Code: [email or web address] URL: [email or web address] Date: 04/20/2023 Prepared by: Glenetta Hew  Exercises - Supine Single Knee to Chest Stretch  - 2-3 x daily - 7 x weekly - 3 reps - 30 sec hold - Supine Lower Trunk Rotation  - 2-3 x daily - 7 x weekly - 5 reps - 10 sec hold - Static Prone on Elbows  - 2-3 x daily - 7 x weekly - 2 sets - 3 reps - 30 sec hold - Standing Lumbar Extension with Counter  - 2-3 x daily - 7 x weekly - 2 sets - 10 reps - 3 sec hold - Standing Lumbar Extension at Wall - Forearms  - 2-3 x daily - 7 x weekly - 2 sets - 10 reps - 3 sec hold - Supine Posterior Pelvic Tilt  - 2-3 x daily - 7 x weekly - 2 sets - 10 reps - Supine March  - 1 x daily - 7 x weekly - 2-3 sets - 10 reps - Supine Bridge  - 1 x daily - 7 x weekly - 2-3 sets - 10 reps -  Cat Cow  - 1 x daily - 7 x weekly - 2 sets - 10 reps - 5 sec hold   ASSESSMENT:  CLINICAL IMPRESSION: Patient overall is doing well, he reports that he was having back pain prior to the stroke, he needed cues for form and mm activation during some new exercises today, with hip abduction the right hip is very weak, tolerated all activities well with some fatigue, felt like the stretches and the knees to chest exercises felt good on the back.  He did well with the balance activities OBJECTIVE IMPAIRMENTS:  Abnormal gait, decreased activity tolerance, decreased endurance, decreased knowledge of condition, decreased mobility, difficulty walking, decreased ROM, decreased strength, hypomobility, increased fascial restrictions, impaired perceived functional ability, increased muscle spasms, impaired flexibility, impaired sensation, improper body mechanics, postural dysfunction, and pain.   ACTIVITY LIMITATIONS: carrying, lifting, bending, sitting, standing, squatting, sleeping, stairs, transfers, bathing, dressing, reach over head, locomotion level, and caring for others  PARTICIPATION LIMITATIONS: meal prep, cleaning, laundry, driving, shopping, community activity, occupation, and yard work  PERSONAL FACTORS: Fitness, Past/current experiences, Profession, Time since onset of injury/illness/exacerbation, and 3+ comorbidities: HTN; DM-II; CA s/p CABG x 4 - 2017; Peripheral neuropathic pain; Obesity  are also affecting patient's functional outcome.   REHAB POTENTIAL: Good  CLINICAL DECISION MAKING: Evolving/moderate complexity  EVALUATION COMPLEXITY: Moderate   GOALS: Goals reviewed with patient? Yes  SHORT TERM GOALS: Target date: 06/06/2023     Patient will be independent with initial HEP to improve outcomes and carryover.  Baseline: Initial HEP provided on eval Goal status: met 06/19/23  2.  Patient will report centralization of radicular symptoms.  Baseline: L>R LE radicular pain down to  calf 06/06/23: reports it doesn't go down the calf as often now Goal status: met 06/19/23  LONG TERM GOALS: Target date: 06/27/2023     Patient will be independent with ongoing/advanced HEP for self-management at home.  Baseline:  Goal status: IN PROGRESS 05/16/23  2.  Patient will report 50-75% improvement in low back pain and L LE radicular pain to improve QOL.  Baseline: 5/10, up to 9/10 with L>R LE radicular pain Goal status: IN PROGRESS 05/16/23  3.  Patient to demonstrate ability to achieve and maintain good spinal alignment/posturing and body mechanics needed for daily activities. Baseline: Flexed trunk posture with rounded shoulders and forward head Goal status: IN PROGRESS 06/19/23  4.  Patient will demonstrate functional pain free lumbar ROM to perform ADLs.   Baseline: Refer to above lumbar ROM table Goal status: IN PROGRESS 06/19/23  5.  Patient will demonstrate improved B LE strength to >/= 4+/5 for improved stability and ease of mobility . Baseline: Refer to above MMT table Goal status: MET 05/16/23  6.  Patient will report 60 on lumbar FOTO to demonstrate improved functional ability.  Baseline: 52 Goal status: IN PROGRESS  05/16/23  7. Patient will report </= 24% on Modified Oswestry to demonstrate improved functional ability with decreased pain interference. Baseline: 18 / 50 = 36.0 %  Goal status: IN PROGRESS 05/16/23  8.  Patient will report ability to resume walking for wellness program at work without limitation due to LBP or L LE radiculopathy. Baseline: Patient has been unable to walk for wellness program due to pain Goal status: IN PROGRESS 05/16/23   PLAN:  PT FREQUENCY: 1x/week   PT DURATION: 6 weeks  PLANNED INTERVENTIONS: Therapeutic exercises, Therapeutic activity, Neuromuscular re-education, Balance training, Gait training, Patient/Family education, Self Care, Joint mobilization, Stair training, Aquatic Therapy, Dry Needling, Electrical  stimulation, Spinal manipulation, Spinal mobilization, Cryotherapy, Moist heat, Taping, Traction, Ultrasound, Ionotophoresis 4mg /ml Dexamethasone, Manual therapy, and Re-evaluation  PLAN FOR NEXT SESSION: continue to work on overall fitness, strength, flexiblity and balance for return to work [employer], PT 06/19/23 9:28 AM

## 2023-06-19 NOTE — Patient Instructions (Signed)
  Coordination Activities  Perform the following activities for 20 minutes 1 times per day with right hand(s).  Rotate ball in fingertips (clockwise and counter-clockwise). Toss ball between hands. Toss ball in air and catch with the same hand. Flip cards 1 at a time as fast as you can. Deal cards with your thumb (Hold deck in hand and push card off top with thumb). Pick up coins and place in container or coin bank. Pick up coins and stack. Pick up coins one at a time until you get 5-10 in your hand, then move coins from palm to fingertips to place in container one at a time. Practice writing and/or typing.

## 2023-06-19 NOTE — Therapy (Signed)
OUTPATIENT SPEECH LANGUAGE PATHOLOGY TREATMENT    Patient Name: Dennis Hopkins MRN: [id number] DOB:18-Feb-1961, 62 y.o., male Today's Date: 06/19/2023  PCP: Donato Schultz, DO REFERRING PROVIDER: Donato Schultz, DO  END OF SESSION:  End of Session - 06/19/23 0903     Visit Number 7    Number of Visits 17    Date for SLP Re-Evaluation 07/03/23    SLP Start Time 0845    SLP Stop Time  0925    SLP Time Calculation (min) 40 min    Activity Tolerance Patient tolerated treatment well             Past Medical History:  Diagnosis Date   Acute CVA (cerebrovascular accident) (HCC) 04/12/2023   Acute left-sided low back pain with right-sided sciatica 02/14/2019   Calcified granuloma of lung 09/06/2021   Chest pain 11/11/2015   Chickenpox    Chronic alcohol use 04/12/2023   Coronary artery disease 01/12/2016   Diabetes (HCC) 01/18/2016   Type 2     Diabetes mellitus (HCC) 04/13/2021   Eczema 04/12/2022   Essential hypertension    Exertional angina (HCC)    History of CAD (coronary artery disease) 04/12/2023   HTN (hypertension) 10/20/2015   Hyperlipemia    Hyperlipidemia    Hyperlipidemia LDL goal <70 10/20/2015   Hypokalemia 04/12/2023   Insulin dependent type 2 diabetes mellitus (HCC) 07/19/2016   Kidney stones 06/21/2011   Lumbar foraminal stenosis 01/05/2022   Morbid obesity (HCC) 04/12/2023   Obesity (BMI 30-39.9) 09/26/2013   Peripheral neuropathic pain 09/06/2021   Peripheral neuropathy 04/12/2023   Preventative health care 09/29/2014   Rash 08/24/2020   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   Snoring 08/24/2020   Tobacco use disorder 09/26/2013   Type 2 diabetes mellitus with diabetic peripheral angiopathy without gangrene, without long-term current use of insulin (HCC) 09/06/2021   Type 2 diabetes mellitus with hyperglycemia, without long-term current use of insulin (HCC) 04/13/2021   Urinary frequency 08/24/2020   Past Surgical History:  Procedure  Laterality Date   CARDIAC CATHETERIZATION N/A 01/07/2016   Procedure: Left Heart Cath and Coronary Angiography;  Surgeon: Corky Crafts, MD;  Location: [facility] INVASIVE CV LAB;  Service: Cardiovascular;  Laterality: N/A;   CORONARY ARTERY BYPASS GRAFT N/A 01/13/2016   Procedure: CORONARY ARTERY BYPASS GRAFTING (CABG) x4 Endoscopic Harvesting of the Right Greater Saphenous Vein;  Surgeon: Loreli Slot, MD;  Location: [facility] OR;  Service: Open Heart Surgery;  Laterality: N/A;   TEE WITHOUT CARDIOVERSION N/A 01/13/2016   Procedure: TRANSESOPHAGEAL ECHOCARDIOGRAM (TEE);  Surgeon: Loreli Slot, MD;  Location: [facility] OR;  Service: Open Heart Surgery;  Laterality: N/A;   TONSILLECTOMY     Patient Active Problem List   Diagnosis Date Noted   Hyperlipemia    Ischemic cerebrovascular accident (CVA) (HCC) 04/12/2023   History of CAD (coronary artery disease) 04/12/2023   Chronic alcohol use 04/12/2023   Hypokalemia 04/12/2023   Morbid obesity (HCC) 04/12/2023   Peripheral neuropathy 04/12/2023   Eczema 04/12/2022   Lumbar foraminal stenosis 01/05/2022   Type 2 diabetes mellitus with diabetic peripheral angiopathy without gangrene, without long-term current use of insulin (HCC) 09/06/2021   Calcified granuloma of lung 09/06/2021   Peripheral neuropathic pain 09/06/2021   Type 2 diabetes mellitus with hyperglycemia, without long-term current use of insulin (HCC) 04/13/2021   Diabetes mellitus (HCC) 04/13/2021   Essential hypertension    Hyperlipidemia    Chickenpox    Urinary  frequency 08/24/2020   Snoring 08/24/2020   Rash 08/24/2020   Acute left-sided low back pain with right-sided sciatica 02/14/2019   Insulin dependent type 2 diabetes mellitus (HCC) 07/19/2016   Diabetes (HCC) 01/18/2016   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   Pt accompanied by: self  PERTINENT HISTORY: Per chart review: medical history significant of CAD status post CABG, hypertension, insulin-dependent DM type II, hyperlipidemia, aspirin allergy, chronic alcohol use   PAIN:  Are you having pain? Yes: NPRS scale: 4/10 Pain location: lower back Pain description: throbbing  Aggravating factors: sitting Relieving factors: medicine taken this morning which is helpful in managing pain     OBJECTIVE:   PATIENT REPORTED OUTCOME MEASURES (PROM): Eval:  Communication Effectiveness Survey 19  06/19/23: Communication Effectiveness Survey 26    TODAY'S TREATMENT:                                                                                                                                         DATE:   06/19/23: Pt was see for skilled ST services targeting cognition and dysarthria.  Patient reports occasionally using speech intelligibility strategies in the community; however, does not complete HEP of functional strategies for additional practice.  Patient intelligibility remains around 75% in a quiet environment with a familiar listener.  He required mod a verbal cues to use strategies in structured and unstructured conversation.  Patient continues to demonstrate a rapid rate and lower vocal intensity in structured sentences and conversation.  Patient reports he plans to return to work on January 14.  SLP was to call patient's friend Junious Dresser to discuss work accommodations this session; however, patient forgot phone.  To call Junious Dresser next  session.  SLP questioning discharge next session as patient is able to recall all strategies and understands the environments and that she needs to employ strategies.  SLP explained the importance of continued practice at home.  SLP administered communication effectiveness survey.  Patient improved score to a 26 and indicating improved feelings/attitudes towards his speech intelligibility.  To continue next session and discussed d/c.   *Pt reports decreased anxiety.   05/27/23: Pt was seen for skilled ST services targeting cognition and dysarthria.  Patient was able to recall 3 strategies for clear speech independently.  Patient still requires consistent cueing to use strategies  in therapy session.  SLP continued education on memory strategies.  We completed external strategies this session -to continue with internal strategies next session. Pt feels like he could benefit from putting items in the same place and using smart devices if necessary.  SLP explained that cognitive load will increase if patient returns to work and he may notice more difficulty.  Patient to speak with Junious Dresser, patient's friend/coworker in HR, to identify the process for return to work and work accommodations if needed.    05/30/23: Pt was seen for skilled ST services targeting cognition and dysarthia. Pt reported he had a doctor's appointment that he missed. SLP facilitated session by encouraging pt to call and make an appointment. Pt was required to repeat himself by administrative assistant 2-3x in 3 minute conversation. SLP encouraged pt to slow down (using hand gestures) and this was helpful in increasing clarity of speech. SLP educated on using pausing when speaking. Pt and SLP collaborated to identify where pauses would be necessary to increase intelligibility on his functional phrases, as pt's rate of speech is extremely fast. Pt required minA verbal and visual (vertical lines for pauses in sentence) cues to use pauses.  Pt  was able to recall 3 strategies for clear speech re: slow down, putting pauses, and getting louder.      05/23/23: Pt was seen for skilled ST services targeting cognition and dysarthria. Upon entering in room, pt speech was observed to be dysarthric ~75% intelligible. Pt did not appear to be self-monitoring his speech. SLP challenged pt cognition with pill box task. Pt currently manages his own medications, but does not have a pill box.  Patient completed medication management task with 100% accuracy.  Patient reports difficulty remembering to pay bills on time.  SLP developed a bill due date sheet.  Patient had some difficulty generating/retrieving names of the companies his bills are from.  With extra time, patient was able to complete.  He reports this happens occasionally.  Throughout today's session, patient required constant cueing to increase intelligibility.  SLP cued patient with " be clear".  To continue.   05/16/23:    50-word intelligibility test: 40/50  Pt reported he turned the TV, shower, and breathing before bed. He reported it helped a little bit. He reported he did fall asleep faster using the breathing exercises.   Educated on "Be CLEAR" dysarthria treatment. Provided pt with handout and practiced functional phrases with min-modA verbal feedback as needed.   1. What are you doing? 2. I have an appointment. 3. Yeah, I have. 4. *Verify appointment/date* December 1st at 3:00 5. Talk to you later. 6. I need to make a deposit. 7. [employer] - Double Cheeseburger and Large Sweet Tea 8. [employer] - Number 12 Combo 9. [employer] - Tray with BBQ sandwich, Donzetta Sprung and a Corn Dog, Coke/Sprite 10. Thank you  05/09/23: Pt was seen for skilled ST services targeting education on relaxation and speech. Pt reported he is having some anxiety at night time where he feels like he's short of breath  - concerned that he won't wake up. He reports shortness of breath is directly related to anxiety (he  is not short of breath any other times). He attempts to breathe deeper and feels like this helps.   We discussed healthy ways to keep his mind active. Pt picked items off a pre-generated list of things he likes to do (or used to like to do). He chose re: reading, talking with people at work, walking.  SLP educated on and demonstrated relaxation/breathing exercises. SLP educated on the purpose of abdominal/diaphragmatic breathing for speech as well as relaxation. After educating on 4 relaxation strategies, pt reported he is most likely to try re: 4:7:8 breathing (MINUS the breath hold due to cardiovascular contraindications) and "belly breathing". SLP encouraged pt to "romanticize" his night time routine to create a more relaxing environment. Turn off TV a little earlier, dim lights, read before bed, take shower, and begin relaxation exercise as soon as he is in bed.   Briefly educated on speech strategy re: increased loudness to increase intelligibility. To begin speech strategy education next session. *AND ASSESS MEDICATION MANAGEMENT IN UPCOMING SESSIONS  PATIENT EDUCATION: Education details: Cognition, dysarthria, SLP role Person educated: Patient Education method: Explanation Education comprehension: needs further education   GOALS: Goals reviewed with patient? Yes  SHORT TERM GOALS: Target date: 06/03/23  Pt will recall 3 strategies to improve intelligibility with minA verbal cues. Baseline: Goal status: MET  2.  Pt will recall 3 strategies to improve memory of important information. Baseline:  Goal status: NOT MET  3.  Pt will recall 2+ organization strategies to assist with managing at home tasks. Baseline:  Goal status: NOT MET    LONG TERM GOALS: Target date: 07/03/22  Pt will demonstrate intelligible speech by utilizing learned compensatory strategies in unstructured conversation.  Baseline:  Goal status: INITIAL  2.  Pt will improve score on PROMs.  Baseline:  Goal  status: INITIAL  3.  Pt will report improved management of bills/home tasks.  Baseline:  Goal status: INITIAL    ASSESSMENT:  CLINICAL IMPRESSION: Pt is a 62  yo male  who presents to ST OP for evaluation post CVA. See treatment note. SLP rec skilled ST services to address dysarthria and cognitive-communication impairment.    OBJECTIVE IMPAIRMENTS: include memory and dysarthria. These impairments are limiting patient from managing medications, managing appointments, managing finances, and effectively communicating at home and in community. Factors affecting potential to achieve goals and functional outcome are  NA . Patient will benefit from skilled SLP services to address above impairments and improve overall function.  REHAB POTENTIAL: Good  PLAN:  SLP FREQUENCY: 1-2x/week  SLP DURATION: 8 weeks  PLANNED INTERVENTIONS: Environmental controls, Cueing hierachy, Internal/external aids, Functional tasks, SLP instruction and feedback, Compensatory strategies, and Patient/family education    [employer], CCC-SLP 06/19/2023, 9:04 AM

## 2023-06-19 NOTE — Therapy (Signed)
OUTPATIENT OCCUPATIONAL THERAPY NEURO Treatment   Patient Name: Dennis Hopkins MRN: [id number] DOB:[birth year], 62 y.o., male Today's Date: 06/19/2023  PCP: Dr. Laury Axon REFERRING PROVIDER: Dr. Laury Axon  END OF SESSION:  OT End of Session - 06/19/23 0826     Visit Number 8    Number of Visits 13    Date for OT Re-Evaluation 07/19/23    Authorization Type Generic provider    Authorization - Visit Number 7    Authorization - Number of Visits 12    OT Start Time 0802    OT Stop Time 0842    OT Time Calculation (min) 40 min    Activity Tolerance Patient tolerated treatment well    Behavior During Therapy [facility] for tasks assessed/performed;Flat affect                   Past Medical History:  Diagnosis Date   Acute CVA (cerebrovascular accident) (HCC) 04/12/2023   Acute left-sided low back pain with right-sided sciatica 02/14/2019   Calcified granuloma of lung 09/06/2021   Chest pain 11/11/2015   Chickenpox    Chronic alcohol use 04/12/2023   Coronary artery disease 01/12/2016   Diabetes (HCC) 01/18/2016   Type 2     Diabetes mellitus (HCC) 04/13/2021   Eczema 04/12/2022   Essential hypertension    Exertional angina (HCC)    History of CAD (coronary artery disease) 04/12/2023   HTN (hypertension) 10/20/2015   Hyperlipemia    Hyperlipidemia    Hyperlipidemia LDL goal <70 10/20/2015   Hypokalemia 04/12/2023   Insulin dependent type 2 diabetes mellitus (HCC) 07/19/2016   Kidney stones 06/21/2011   Lumbar foraminal stenosis 01/05/2022   Morbid obesity (HCC) 04/12/2023   Obesity (BMI 30-39.9) 09/26/2013   Peripheral neuropathic pain 09/06/2021   Peripheral neuropathy 04/12/2023   Preventative health care 09/29/2014   Rash 08/24/2020   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   Snoring 08/24/2020   Tobacco use disorder 09/26/2013   Type 2 diabetes mellitus with diabetic peripheral angiopathy without gangrene, without long-term current use of insulin (HCC) 09/06/2021   Type 2  diabetes mellitus with hyperglycemia, without long-term current use of insulin (HCC) 04/13/2021   Urinary frequency 08/24/2020   Past Surgical History:  Procedure Laterality Date   CARDIAC CATHETERIZATION N/A 01/07/2016   Procedure: Left Heart Cath and Coronary Angiography;  Surgeon: Corky Crafts, MD;  Location: [facility] INVASIVE CV LAB;  Service: Cardiovascular;  Laterality: N/A;   CORONARY ARTERY BYPASS GRAFT N/A 01/13/2016   Procedure: CORONARY ARTERY BYPASS GRAFTING (CABG) x4 Endoscopic Harvesting of the Right Greater Saphenous Vein;  Surgeon: Loreli Slot, MD;  Location: [facility] OR;  Service: Open Heart Surgery;  Laterality: N/A;   TEE WITHOUT CARDIOVERSION N/A 01/13/2016   Procedure: TRANSESOPHAGEAL ECHOCARDIOGRAM (TEE);  Surgeon: Loreli Slot, MD;  Location: [facility] OR;  Service: Open Heart Surgery;  Laterality: N/A;   TONSILLECTOMY     Patient Active Problem List   Diagnosis Date Noted   Hyperlipemia    Ischemic cerebrovascular accident (CVA) (HCC) 04/12/2023   History of CAD (coronary artery disease) 04/12/2023   Chronic alcohol use 04/12/2023   Hypokalemia 04/12/2023   Morbid obesity (HCC) 04/12/2023   Peripheral neuropathy 04/12/2023   Eczema 04/12/2022   Lumbar foraminal stenosis 01/05/2022   Type 2 diabetes mellitus with diabetic peripheral angiopathy without gangrene, without long-term current use of insulin (HCC) 09/06/2021   Calcified granuloma of lung 09/06/2021   Peripheral neuropathic pain 09/06/2021   Type 2  diabetes mellitus with hyperglycemia, without long-term current use of insulin (HCC) 04/13/2021   Diabetes mellitus (HCC) 04/13/2021   Essential hypertension    Hyperlipidemia    Chickenpox    Urinary frequency 08/24/2020   Snoring 08/24/2020   Rash 08/24/2020   Acute left-sided low back pain with right-sided sciatica 02/14/2019   Insulin dependent type 2 diabetes mellitus (HCC) 07/19/2016   Diabetes (HCC) 01/18/2016   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  Rationale for Evaluation and Treatment: Rehabilitation  SUBJECTIVE:   SUBJECTIVE STATEMENT: Pt reports i continued back pain  PERTINENT HISTORY: Per chart 62 y.o. male presented to emergency department 04/12/23 with complaining of right-sided weakness; CTA head which showed left MCA territory infarction along with ICA severe stenosis as well as occlusion of the left vertebral artery. MRI multiple acute infarcts L MCA and PCA territories, old R frontal infarct PMH significant of CAD status post CABG, hypertension, insulin-dependent DM type II, hyperlipidemia, aspirin allergy, chronic alcohol use, and obesity   PRECAUTIONS: Fall   PAIN: 5/10  Are you having pain? Yes: NPRS scale: 5/10 Pain location: back Pain description: aching Aggravating factors: standing Relieving factors: rest, heat , OT will not address directly as pt is seeing PT for this  FALLS: Has patient fallen in last 6 months? No  LIVING ENVIRONMENT: Lives with: lives alone Lives in: House/apartment Stairs: No Has following equipment at home: Environmental consultant - 4 wheeled, Wheelchair (manual), and shower chair  PLOF: Independent  PATIENT GOALS: improve use of right hand   OBJECTIVE:  Note: Objective measures were completed at Evaluation unless otherwise noted.  HAND DOMINANCE: Right  ADLs: Overall ADLs: increased time required and difficulty using R dominant hand due to weakness and incoordination Transfers/ambulation related  to ADLs: Eating: difficulty feeding self with RUE, and cutting food  Grooming: difficulty shaving pt cut himself UB Dressing: mod I LB Dressing: mod I Toileting: mod i Bathing: mod I  Tub Shower transfers: mod I Equipment: Shower seat with back  IADLs: Shopping: does light shopping  Meal Prep: using microwave  Community mobility: mod I Medication management: keeps  up with own meds Financial management: grandson helps Handwriting: Not legible  MOBILITY STATUS: Independent       UPPER EXTREMITY ROM:    Active ROM Right eval Left eval  Shoulder flexion 125   Shoulder abduction    Shoulder adduction    Shoulder extension    Shoulder internal rotation    Shoulder external rotation    Elbow flexion    Elbow extension -10   Wrist flexion 63   Wrist extension 40   Wrist ulnar deviation    Wrist radial deviation    Wrist pronation    Wrist supination    (Blank rows = not tested)   HAND FUNCTION: Grip strength: Right: 40 lbs; Left: 75 lbs  COORDINATION: 9 Hole Peg test: Right: 1 min 45 sec; Left: 33.34 sec   SENSATION: Mild numbness in RUE  EDEMA: mild edema  in RUE    COGNITION: Overall cognitive status:  to be further assessed in a functional context  VISION: Subjective report: Pt reports he got new glasses but they are not working as well. Baseline vision: Wears glasses for reading only   VISION ASSESSMENT: Visual Fields: per hospital notes right visual field deficits, per gross assessment no significant deficit noted will assess in functional context number cancellation: 2/44 missed Pt was cautioned to exercise caution and double check on the right side when driving, as pt reports MD cleared him to drive      OBSERVATIONS: Pleasant male with speech difficulties is agreeable to OT treatment   TODAY'S TREATMENT:                                                                                                                              DATE:  06/19/23- continued discussion regarding return to work. Therapist recommends that pt has someone at work who can check off his safety with fork lift use prior to returning to use. Pt states he ahs talked with his boss about this. Pt reports concerns about lifitn at work due to his back pain. Pt simulated lifting a 28 lbs box and carrying approx 125 ft. then pt lifting box to and from counter from mat 10x. Pt performed without difficulty yet he reports he feels it in his back.  Upgraded theraband HEP to green and pt performed 10 reps each for increased strength, min v.c UBE x 6 mins level 3 for conditioning. Pt. was instructed in coordination HEP, see pt instructions, min v.c and demonstration. Pt [practiced writing with improved legibility and letter size with foam grip and practice.  06/06/23 Therapeutic discussion with pt regarding pt plans to return to work. 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 Reports boss could potentially review safety with forklift. Red theraband BUE strengthening 1x10 scap rows, RUE bicep curl, RUE elbow ext, RUE shoulder flex/ext with min vc's and demonstration, education on performing at home as well. Handout provided. Pt performing work simulated tasks with carrying/lifting 19# and 25# box to various heights 1x10, carrying grocery bag task with 8# approx 161ft with reports of feeling minimal discomfort in digits after carrying.  Green theraputty "cutting food" with R hand cutting to simulate feeding, no difficulty noted.  Pt performing pegged board design with R hand for [facility] with cognitive and visual component by copying design, min vc needed. Pt omitted several pegs but with questioning cues was able to self correct.  Discussion with pt about having friend from work attend next visit as she works in HR and may be able to assist with accomodations.    05/30/23 UBE x 6 mins level  1 for conditioning Hot pack applied to back x 20 mins while perfroming cane exercises and fine motor coordination tasks. No adverse reactions Cane exercises with 3 lbs cane for  shoulder flexion, chest press and biceps curls 2 sets of 10 reps, min v.c Pt was instructed in green putty exercises for grip and tip pinch 20 reps each, min v.c. Placing grooved pegs into pegboard with RUE for increased fine motor coordination, mod difficulty, removing with inhand manipulation, min difficulty/ v.c   05/23/23- Therapist started checking progress towards goals. see goals for progress. Hotpack applied to low back x 20 mins for pain relief, no adverse reactions while performing exercises.  Cane exercises with 2 lbs cane for shoulder flexion, chest pres, biceps curls 10-15 rpes each, min v.c Environmental scanning 14/15 located on first pass, pt located remaining items without v.c Copying small peg design with RUE for increased fine motor coordination, min difficulty and increased time required. Removing with in hand manipulation, min v.c  Placing graded clothespins on vertical target with RUE for sustained pinch 1-8#, min v.c for tip or 3 pt pinch..  05/16/23- Seated Using 2 lbs cane pt performed 20 reps each for shoulder flexion, chest press, and biceps curls, min v.c Hotpack applied to back x 15 mins while seated, for pain no adverse reactions. Copying small peg design with RUE for increased fine motor coordination, min-mod difficulty and increased time required. Flipping playing cards, stacking coins and manipulating in hand for increased fine motor coordiantion, min v.c   UBE x 6 mins level 1 for conditioning.    05/09/23- Cane exercises for shoulder flexion, abduction and biceps curls 10-15 reps each, min v.c  Functional reaching mid range to place large pegs in vertical pegboard with RUE, mod v.c/ mod difficulty and drops Placing and removing graded clothespins from target with RUE for sustained pinch  1-8#, min v.c  Flipping and dealing playing cards with RUE for increased fine motor coordiantion, min v.c  UBE x 6 mins level 1 for conditioning  05/02/23 Pt was instructed in initial HEP for cane exercises in sitting and exercises for wrist, forearm and hand and beginning coordination, min v.c and demonstration. Fit of prefab resting splint was assessed however since pt now has increased function in hand, therapist recommends pt does not wear splint so he can use hand. Arm bike x 6 mins level 1 for conditioning 04/26/23 -eval only  PATIENT EDUCATION: Education details: theraband HEP,  green band, coordiantion HEP. Person educated: Patient Education method: Verbal cues, demonstration, min v.c, handout provided  Education comprehension: verbalized understanding, returned demonstration, min v.c   HOME EXERCISE PROGRAM: Cane exercises, beginning, coordination 05/02/23 Green putty 05/30/23 red theraband 06/06/23 coordiantion 06/19/23  GOALS: Goals reviewed with patient? Yes  SHORT TERM GOALS: Target date: 05/25/23  I with initial HEP  Goal status: met 05/23/23  2.  Pt will demonstrate improved fine motor coordination as evidenced by decreasing 9 hole peg test score by 5 secs from initial. Baseline: 1 min 45 secs Goal status: met 44 secs 05/23/23  3.  Pt will demonstrate understanding of edema control techniques and sensory precautions  Goal status:  met, verbalizes understanding  4.  Pt will demonstrate grossly 90% composite finger flexion with RUE for increased functional use. Baseline: 85% Goal status: met, 05/23/23  5.  Pt will report consistently feeding himself with RUE.  Goal status: met, 05/23/23  6.  I with with adaptive equipment and strategies to maximize pt's safety and I with ADLs/IADLS.  Goal status: met, no AE needed 06/06/23 7. Pt will perform environmental scanning with 90% or better accuracy. Goal status:met 93% 05/23/23  LONG TERM GOALS: Target date:  07/19/23  I with updated HEP. Baseline:  Goal status:  ongoing 05/30/23  2.  Pt will demonstrate improved fine motor coordination for ADLs as evidenced by decreasing 9 hole peg test score  to 90 secs Pt will demonstrate improved RUE fine motor coordination for ADLS as evidenced by decreassing 9 hole peg test to 40 secs or less. Baseline: 1 min 45 secs, goal met 05/23/23- 44 secs Goal status: met 44 secs 05/23/23, goal upgraded see above  3.  Pt will increase RUE grip strength to 50 lbs for increased functional use. Baseline: 40 lbs Goal status: met 75 lbs 05/30/23  4.  Pt will resume use of RUE as dominant hand 50% of the time for ADLS/light IADLs.  Goal status:  ongoing 05/30/23  5.  Pt will demonstrate ability to retrieve a 3 lbs object from overhead shelf without drops x 5 reps  Goal status: ongoing 05/30/23  6.  Pt will demonstrate ability to carry an 8 lbs bag of groceries with RUE.  Goal status:  ongoing, 05/30/23   ASSESSMENT:  CLINICAL IMPRESSION: Pt is progressing towards goals. He demonstrates improving RUE functional use and fine motor coordination.  Pt hopes to return to work in January. Therapist has mad recommendations that he needs to talk with his work regarding accomodations. Pt. will benefit from someone at work checking off his safety with forklift use before he returns to performance. Pt also may benefit from accomodations that do not require him to perform heavy lifitng due to back pain and risk for injury. Pt was able to lift 28 lb box in clinic, however he report he feels it in his back    PERFORMANCE DEFICITS: in functional skills including ADLs, IADLs, coordination, dexterity, sensation, edema, ROM, strength, pain, flexibility, Fine motor control, Gross motor control, mobility, balance, endurance, decreased knowledge of precautions, decreased knowledge of use of DME, vision, and UE functional use, cognitive skills including attention, problem solving, safety  awareness, thought, and understand, and psychosocial skills including coping strategies, environmental adaptation, habits, interpersonal interactions, and routines and behaviors.   IMPAIRMENTS: are limiting patient from ADLs, IADLs, rest and sleep, work, play, leisure, and social participation.   CO-MORBIDITIES: may have co-morbidities  that affects occupational performance. Patient will benefit from skilled OT to address above impairments and improve overall function.  MODIFICATION OR ASSISTANCE TO COMPLETE EVALUATION: No modification of tasks or assist necessary to complete an evaluation.  OT OCCUPATIONAL PROFILE AND HISTORY: Detailed assessment: Review of records and additional review of physical, cognitive, psychosocial history related to current functional performance.  CLINICAL DECISION MAKING: LOW - limited treatment options, no task modification necessary  REHAB POTENTIAL: Good  EVALUATION COMPLEXITY: Low    PLAN:  OT FREQUENCY: 1x/week plus  OT DURATION: 12 weeks (however may wrap up dependent upon progress and insurance limitations)  PLANNED INTERVENTIONS: 16109 OT Re-evaluation, 97535 self care/ADL training, 60454 therapeutic exercise, 97530 therapeutic activity, 97112 neuromuscular re-education, 97140 manual therapy, 97113 aquatic therapy, 97035 ultrasound, 97018 paraffin, 09811 moist heat, 97010 cryotherapy, 97129 Cognitive training (first 15 min), 91478 Cognitive training(each additional 15 min), 29562 Splinting (initial encounter), passive range of motion, functional mobility training, visual/perceptual remediation/compensation, psychosocial skills training, energy conservation, coping strategies training, patient/family education, and DME and/or AE instructions  RECOMMENDED OTHER SERVICES: ST  CONSULTED AND AGREED WITH PLAN OF CARE: Patient  PLAN FOR NEXT SESSION: continue to address coordination and safety recommendations for return to work   Arin Vanosdol,  OT 06/19/2023, 8:28 AM

## 2023-06-22 ENCOUNTER — Ambulatory Visit (INDEPENDENT_AMBULATORY_CARE_PROVIDER_SITE_OTHER): Payer: PRIVATE HEALTH INSURANCE | Admitting: Family Medicine

## 2023-06-22 ENCOUNTER — Encounter: Payer: Self-pay | Admitting: Family Medicine

## 2023-06-22 VITALS — BP 118/62 | HR 57 | Temp 98.0°F | Resp 18 | Ht 70.0 in | Wt 228.4 lb

## 2023-06-22 DIAGNOSIS — E1151 Type 2 diabetes mellitus with diabetic peripheral angiopathy without gangrene: Secondary | ICD-10-CM

## 2023-06-22 DIAGNOSIS — I10 Essential (primary) hypertension: Secondary | ICD-10-CM

## 2023-06-22 DIAGNOSIS — I639 Cerebral infarction, unspecified: Secondary | ICD-10-CM | POA: Diagnosis not present

## 2023-06-22 DIAGNOSIS — Z951 Presence of aortocoronary bypass graft: Secondary | ICD-10-CM

## 2023-06-22 DIAGNOSIS — E785 Hyperlipidemia, unspecified: Secondary | ICD-10-CM | POA: Diagnosis not present

## 2023-06-22 DIAGNOSIS — M48061 Spinal stenosis, lumbar region without neurogenic claudication: Secondary | ICD-10-CM | POA: Diagnosis not present

## 2023-06-22 DIAGNOSIS — R29898 Other symptoms and signs involving the musculoskeletal system: Secondary | ICD-10-CM

## 2023-06-22 DIAGNOSIS — E119 Type 2 diabetes mellitus without complications: Secondary | ICD-10-CM | POA: Diagnosis not present

## 2023-06-22 DIAGNOSIS — M5441 Lumbago with sciatica, right side: Secondary | ICD-10-CM

## 2023-06-22 HISTORY — DX: Other symptoms and signs involving the musculoskeletal system: R29.898

## 2023-06-22 LAB — MICROALBUMIN / CREATININE URINE RATIO
Creatinine,U: 116.1 mg/dL
Microalb Creat Ratio: 10 mg/g (ref 0.0–30.0)
Microalb, Ur: 11.7 mg/dL — ABNORMAL HIGH (ref 0.0–1.9)

## 2023-06-22 MED ORDER — TRAMADOL HCL 50 MG PO TABS: 50.0000 mg | ORAL_TABLET | Freq: Four times a day (QID) | ORAL | 0 refills | Status: DC | PRN

## 2023-06-22 MED ORDER — METHOCARBAMOL 500 MG PO TABS: 1000.0000 mg | ORAL_TABLET | Freq: Four times a day (QID) | ORAL | 2 refills | Status: DC | PRN

## 2023-06-22 NOTE — Assessment & Plan Note (Signed)
Stable On tramadol

## 2023-06-22 NOTE — Assessment & Plan Note (Signed)
 [id number] to be checked minimize simple carbs. Increase exercise as tolerated. Continue current meds

## 2023-06-22 NOTE — Assessment & Plan Note (Signed)
 Almost completely resolved  Pt to go back to work on the 14th

## 2023-06-22 NOTE — Progress Notes (Signed)
 Established Patient Office Visit  Subjective   Patient ID: Dennis Hopkins, male    DOB: 08/13/1960  Age: 63 y.o. MRN: [id number]  Chief Complaint  Patient presents with   Cerebrovascular Accident   Follow-up    HPI Discussed the use of AI scribe software for clinical note transcription with the patient, who gave verbal consent to proceed.  History of Present Illness   The patient, with a history of chronic back pain, reports persistent discomfort in the lower back. Despite recent physical therapy sessions, which included muscle work to loosen knots, the pain remains. The patient lives alone and maintains an active lifestyle, including regular walking, despite the ongoing discomfort.  The patient has been taking methocarbamol  and tramadol  for pain management. However, she reports that the methocarbamol  seems weak, and taking an extra dose during the day appears to provide more relief. The patient has been trying to reduce the intake of both medications.  The patient also has a history of diabetes, with the last recorded A1c at 9.1. She reports trying to eat better and monitor her blood pressure regularly. However, she has not been checking her blood sugar levels recently.  The patient also reports neuropathy in her feet. She is scheduled for speech therapy and has been advised to speak louder and slower.  The patient is planning to return to work soon, where she occasionally has to lift heavy items. She reports that lifting anything over 25-30 pounds is rare, and she usually handles small boxes. However, she expresses concern about handling items that weigh around 100 pounds.  The patient is scheduled for more physical therapy sessions and is considering increasing the dose of methocarbamol . She is also considering refilling her tramadol  prescription.      Patient Active Problem List   Diagnosis Date Noted   Right hand weakness 06/22/2023   Hyperlipemia    Ischemic cerebrovascular  accident (CVA) (HCC) 04/12/2023   History of CAD (coronary artery disease) 04/12/2023   Chronic alcohol use 04/12/2023   Hypokalemia 04/12/2023   Morbid obesity (HCC) 04/12/2023   Peripheral neuropathy 04/12/2023   Eczema 04/12/2022   Lumbar foraminal stenosis 01/05/2022   Type 2 diabetes mellitus with diabetic peripheral angiopathy without gangrene, without long-term current use of insulin  (HCC) 09/06/2021   Calcified granuloma of lung 09/06/2021   Peripheral neuropathic pain 09/06/2021   Type 2 diabetes mellitus with hyperglycemia, without long-term current use of insulin  (HCC) 04/13/2021   Diabetes mellitus (HCC) 04/13/2021   Essential hypertension    Hyperlipidemia    Chickenpox    Urinary frequency 08/24/2020   Snoring 08/24/2020   Rash 08/24/2020   Acute left-sided low back pain with right-sided sciatica 02/14/2019   Insulin  dependent type 2 diabetes mellitus (HCC) 07/19/2016   Diabetes (HCC) 01/18/2016   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   CAD, s/p CABG in 2017 [LIMA-LAD, SVG-OM1, SVG-RPDA] 01/12/2016   Exertional angina (HCC)    Chest pain 11/11/2015   HTN (hypertension) 10/20/2015   Hyperlipidemia LDL goal <70 10/20/2015   Preventative health care 09/29/2014   Obesity (BMI 30-39.9) 09/26/2013   Tobacco use disorder 09/26/2013   Past Medical History:  Diagnosis Date   Acute CVA (cerebrovascular accident) (HCC) 04/12/2023   Acute left-sided low back pain with right-sided sciatica 02/14/2019   Calcified granuloma of lung 09/06/2021   Chest pain 11/11/2015   Chickenpox    Chronic alcohol use 04/12/2023   Coronary artery disease 01/12/2016   Diabetes (HCC) 01/18/2016   Type  2     Diabetes mellitus (HCC) 04/13/2021   Eczema 04/12/2022   Essential hypertension    Exertional angina (HCC)    History of CAD (coronary artery disease) 04/12/2023   HTN (hypertension) 10/20/2015   Hyperlipemia    Hyperlipidemia    Hyperlipidemia LDL goal <70 10/20/2015   Hypokalemia 04/12/2023    Insulin  dependent type 2 diabetes mellitus (HCC) 07/19/2016   Kidney stones 06/21/2011   Lumbar foraminal stenosis 01/05/2022   Morbid obesity (HCC) 04/12/2023   Obesity (BMI 30-39.9) 09/26/2013   Peripheral neuropathic pain 09/06/2021   Peripheral neuropathy 04/12/2023   Preventative health care 09/29/2014   Rash 08/24/2020   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   Snoring 08/24/2020   Tobacco use disorder 09/26/2013   Type 2 diabetes mellitus with diabetic peripheral angiopathy without gangrene, without long-term current use of insulin  (HCC) 09/06/2021   Type 2 diabetes mellitus with hyperglycemia, without long-term current use of insulin  (HCC) 04/13/2021   Urinary frequency 08/24/2020   Past Surgical History:  Procedure Laterality Date   CARDIAC CATHETERIZATION N/A 01/07/2016   Procedure: Left Heart Cath and Coronary Angiography;  Surgeon: Candyce GORMAN Reek, MD;  Location: [facility];  Service: Cardiovascular;  Laterality: N/A;   CORONARY ARTERY BYPASS GRAFT N/A 01/13/2016   Procedure: CORONARY ARTERY BYPASS GRAFTING (CABG) x4 Endoscopic Harvesting of the Right Greater Saphenous Vein;  Surgeon: Elspeth JAYSON Millers, MD;  Location: [facility] OR;  Service: Open Heart Surgery;  Laterality: N/A;   TEE WITHOUT CARDIOVERSION N/A 01/13/2016   Procedure: TRANSESOPHAGEAL ECHOCARDIOGRAM (TEE);  Surgeon: Elspeth JAYSON Millers, MD;  Location: [facility] OR;  Service: Open Heart Surgery;  Laterality: N/A;   TONSILLECTOMY     Social History   Tobacco Use   Smoking status: Former    Current packs/day: 0.00    Average packs/day: 2.0 packs/day for 35.0 years (70.0 ttl pk-yrs)    Types: Cigarettes    Start date: 12/22/1978    Quit date: 12/21/2013    Years since quitting: 9.5   Smokeless tobacco: Never  Vaping Use   Vaping status: Never Used  Substance Use Topics   Alcohol use: Yes    Alcohol/week: 0.0 standard drinks of alcohol    Comment: 1-2 drinks per night- wife reports he drinks more    Drug use: No    Social History   Socioeconomic History   Marital status: Widowed    Spouse name: Not on file   Number of children: 1   Years of education: Not on file   Highest education level: Not on file  Occupational History   Not on file  Tobacco Use   Smoking status: Former    Current packs/day: 0.00    Average packs/day: 2.0 packs/day for 35.0 years (70.0 ttl pk-yrs)    Types: Cigarettes    Start date: 12/22/1978    Quit date: 12/21/2013    Years since quitting: 9.5   Smokeless tobacco: Never  Vaping Use   Vaping status: Never Used  Substance and Sexual Activity   Alcohol use: Yes    Alcohol/week: 0.0 standard drinks of alcohol    Comment: 1-2 drinks per night- wife reports he drinks more    Drug use: No   Sexual activity: Yes    Partners: Female  Other Topics Concern   Not on file  Social History Narrative   Not on file   Social Drivers of Health   Financial Resource Strain: Low Risk  (11/22/2021)   Overall Financial  Resource Strain (CARDIA)    Difficulty of Paying Living Expenses: Not very hard  Food Insecurity: Not on file  Transportation Needs: Not on file  Physical Activity: Not on file  Stress: Not on file  Social Connections: Not on file  Intimate Partner Violence: Not on file   Family Status  Relation Name Status   Mother  Deceased   Father  Deceased   Brother  Alive   Sister  Alive   Sister  Alive  No partnership data on file   Family History  Problem Relation Age of Onset   Arthritis Mother    Diabetes Mother    Arthritis Father    Hyperlipidemia Father    Diabetes Father    Diabetes Brother    Allergies  Allergen Reactions   Asa [Aspirin] Anaphylaxis, Swelling and Other (See Comments)    Lips swelling      Review of Systems  Constitutional:  Negative for chills, fever and malaise/fatigue.  HENT:  Negative for congestion and hearing loss.   Eyes:  Negative for blurred vision and discharge.  Respiratory:  Negative for cough, sputum production and  shortness of breath.   Cardiovascular:  Negative for chest pain, palpitations and leg swelling.  Gastrointestinal:  Negative for abdominal pain, blood in stool, constipation, diarrhea, heartburn, nausea and vomiting.  Genitourinary:  Negative for dysuria, frequency, hematuria and urgency.  Musculoskeletal:  Negative for back pain, falls and myalgias.  Skin:  Negative for rash.  Neurological:  Negative for dizziness, sensory change, loss of consciousness, weakness and headaches.  Endo/Heme/Allergies:  Negative for environmental allergies. Does not bruise/bleed easily.  Psychiatric/Behavioral:  Negative for depression and suicidal ideas. The patient is not nervous/anxious and does not have insomnia.       Objective:     BP 118/62 (BP Location: Left Arm, Patient Position: Sitting, Cuff Size: Large)   Pulse (!) 57   Temp 98 F (36.7 C) (Oral)   Resp 18   Ht 5' 10 (1.778 m)   Wt 228 lb 6.4 oz (103.6 kg)   SpO2 98%   BMI 32.77 kg/m  BP Readings from Last 3 Encounters:  06/22/23 118/62  05/15/23 118/68  05/15/23 124/70   Wt Readings from Last 3 Encounters:  06/22/23 228 lb 6.4 oz (103.6 kg)  05/15/23 222 lb (100.7 kg)  05/15/23 220 lb 8 oz (100 kg)   SpO2 Readings from Last 3 Encounters:  06/22/23 98%  05/15/23 98%  04/20/23 98%      Physical Exam Vitals and nursing note reviewed.  Constitutional:      General: He is not in acute distress.    Appearance: Normal appearance. He is well-developed.  HENT:     Head: Normocephalic and atraumatic.     Right Ear: Tympanic membrane, ear canal and external ear normal. There is no impacted cerumen.     Left Ear: Tympanic membrane, ear canal and external ear normal. There is no impacted cerumen.     Nose: Nose normal.     Mouth/Throat:     Mouth: Mucous membranes are moist.     Pharynx: Oropharynx is clear. No oropharyngeal exudate or posterior oropharyngeal erythema.  Eyes:     General: No scleral icterus.       Right eye: No  discharge.        Left eye: No discharge.     Conjunctiva/sclera: Conjunctivae normal.     Pupils: Pupils are equal, round, and reactive to light.  Neck:  Thyroid : No thyromegaly.     Vascular: No JVD.  Cardiovascular:     Rate and Rhythm: Normal rate and regular rhythm.     Heart sounds: Normal heart sounds. No murmur heard. Pulmonary:     Effort: Pulmonary effort is normal. No respiratory distress.     Breath sounds: Normal breath sounds.  Abdominal:     General: Bowel sounds are normal. There is no distension.     Palpations: Abdomen is soft. There is no mass.     Tenderness: There is no abdominal tenderness. There is no guarding or rebound.  Musculoskeletal:        General: Normal range of motion.     Cervical back: Normal range of motion and neck supple.     Right lower leg: No edema.     Left lower leg: No edema.  Lymphadenopathy:     Cervical: No cervical adenopathy.  Skin:    General: Skin is warm and dry.     Findings: No erythema or rash.  Neurological:     Mental Status: He is alert and oriented to person, place, and time.     Cranial Nerves: No cranial nerve deficit.     Motor: No abnormal muscle tone.     Deep Tendon Reflexes: Reflexes are normal and symmetric. Reflexes normal.  Psychiatric:        Mood and Affect: Mood normal.        Behavior: Behavior normal.        Thought Content: Thought content normal.        Judgment: Judgment normal.      No results found for any visits on 06/22/23.  Last CBC Lab Results  Component Value Date   WBC 10.1 04/20/2023   HGB 16.0 04/20/2023   HCT 49.3 04/20/2023   MCV 89.9 04/20/2023   MCH 28.8 04/13/2023   RDW 13.8 04/20/2023   PLT 242.0 04/20/2023   Last metabolic panel Lab Results  Component Value Date   GLUCOSE 139 (H) 04/20/2023   NA 139 04/20/2023   K 4.2 04/20/2023   CL 99 04/20/2023   CO2 29 04/20/2023   BUN 21 04/20/2023   CREATININE 1.48 04/20/2023   GFR 50.54 (L) 04/20/2023   CALCIUM  10.4  04/20/2023   PHOS 3.3 04/15/2023   PROT 8.1 04/20/2023   ALBUMIN  4.5 04/20/2023   BILITOT 0.7 04/20/2023   ALKPHOS 74 04/20/2023   AST 33 04/20/2023   ALT 39 04/20/2023   ANIONGAP 12 04/15/2023   Last lipids Lab Results  Component Value Date   CHOL 221 (H) 04/13/2023   HDL 38 (L) 04/13/2023   LDLCALC 162 (H) 04/13/2023   LDLDIRECT 173.0 02/07/2022   TRIG 107 04/13/2023   CHOLHDL 5.8 04/13/2023   Last hemoglobin A1c Lab Results  Component Value Date   HGBA1C 9.1 (H) 02/17/2023   Last thyroid  functions Lab Results  Component Value Date   TSH 1.39 02/07/2022   Last vitamin D No results found for: MARIEN BOLLS, VD25OH Last vitamin B12 and Folate Lab Results  Component Value Date   VITAMINB12 322 02/17/2021   FOLATE 11.6 02/17/2021      The ASCVD Risk score (Arnett DK, et al., 2019) failed to calculate for the following reasons:   Risk score cannot be calculated because patient has a medical history suggesting prior/existing ASCVD    Assessment & Plan:   Problem List Items Addressed This Visit       Unprioritized   HTN (hypertension)  Relevant Orders   CBC with Differential/Platelet   Comprehensive metabolic panel   Microalbumin / creatinine urine ratio   S/P CABG x 4   Lumbar foraminal stenosis   Relevant Medications   methocarbamol  (ROBAXIN ) 500 MG tablet   traMADol  (ULTRAM ) 50 MG tablet   Hyperlipidemia LDL goal <70   Relevant Orders   Lipid panel   Comprehensive metabolic panel   Type 2 diabetes mellitus with diabetic peripheral angiopathy without gangrene, without long-term current use of insulin  (HCC)   hgba1c to be checked  minimize simple carbs. Increase exercise as tolerated. Continue current meds       Right hand weakness   Almost completely resolved  Pt to go back to work on the 14th       Ischemic cerebrovascular accident (CVA) (HCC) - Primary   Stable  Minimal R hand weakness       Hyperlipemia   Encourage heart  healthy diet such as MIND or DASH diet, increase exercise, avoid trans fats, simple carbohydrates and processed foods, consider a krill or fish or flaxseed oil cap daily.        Essential hypertension   Well controlled, no changes to meds. Encouraged heart healthy diet such as the DASH diet and exercise as tolerated.        Acute left-sided low back pain with right-sided sciatica   Stable  On tramadol       Relevant Medications   methocarbamol  (ROBAXIN ) 500 MG tablet   traMADol  (ULTRAM ) 50 MG tablet   Other Visit Diagnoses       Diabetes mellitus type II, non insulin  dependent (HCC)       Relevant Orders   Hemoglobin A1c   Microalbumin / creatinine urine ratio     Assessment and Plan    Chronic Low Back Pain   She continues to experience persistent low back pain despite current treatment. Methocarbamol  at 500 mg provides relief, but we discussed increasing the dose to 750 mg as needed, while also informing her about potential side effects such as drowsiness. She has been advised to avoid lifting over 35 pounds and will be provided a note for work restrictions.  Diabetes Mellitus Type 2   Her A1c level is 9.1, indicating poor glycemic control. She is not checking her blood sugar regularly. We emphasized the importance of regular monitoring and dietary modifications. We will check A1c today, encourage regular blood sugar monitoring, and discuss dietary modifications further.  Peripheral Neuropathy   Her neuropathy in the feet is likely secondary to diabetes. We emphasized the importance of glycemic control to prevent progression. We will monitor symptoms and ensure glycemic control to prevent progression.  General Health Maintenance   She has not had her blood pressure checked recently or undergone a comprehensive health check. We will check blood pressure today and schedule a follow-up for a comprehensive health check.  Follow-up   We will schedule a follow-up appointment after  lab results are available.        Return in about 3 weeks (around 07/13/2023), or if symptoms worsen or fail to improve, for hypertension, hyperlipidemia, diabetes II.    Cassandre Oleksy R Lowne Chase, DO

## 2023-06-22 NOTE — Assessment & Plan Note (Signed)
 Encourage heart healthy diet such as MIND or DASH diet, increase exercise, avoid trans fats, simple carbohydrates and processed foods, consider a krill or fish or flaxseed oil cap daily.

## 2023-06-22 NOTE — Assessment & Plan Note (Signed)
 Well controlled, no changes to meds. Encouraged heart healthy diet such as the DASH diet and exercise as tolerated.

## 2023-06-22 NOTE — Patient Instructions (Signed)

## 2023-06-22 NOTE — Assessment & Plan Note (Signed)
 Stable  Minimal R hand weakness

## 2023-06-23 LAB — LIPID PANEL
Cholesterol: 164 mg/dL (ref 0–200)
HDL: 38.4 mg/dL — ABNORMAL LOW (ref >39.00)
LDL Cholesterol: 94 mg/dL (ref 0–99)
NonHDL: 125.73
Total CHOL/HDL Ratio: 4
Triglycerides: 157 mg/dL — ABNORMAL HIGH (ref 0.0–149.0)
VLDL: 31.4 mg/dL (ref 0.0–40.0)

## 2023-06-23 LAB — COMPREHENSIVE METABOLIC PANEL
ALT: 10 U/L (ref 0–53)
AST: 14 U/L (ref 0–37)
Albumin: 4.3 g/dL (ref 3.5–5.2)
Alkaline Phosphatase: 58 U/L (ref 39–117)
BUN: 16 mg/dL (ref 6–23)
CO2: 29 meq/L (ref 19–32)
Calcium: 9.6 mg/dL (ref 8.4–10.5)
Chloride: 99 meq/L (ref 96–112)
Creatinine, Ser: 0.91 mg/dL (ref 0.40–1.50)
GFR: 90.48 mL/min (ref >60.00)
Glucose, Bld: 168 mg/dL — ABNORMAL HIGH (ref 70–99)
Potassium: 4.4 meq/L (ref 3.5–5.1)
Sodium: 139 meq/L (ref 135–145)
Total Bilirubin: 0.5 mg/dL (ref 0.2–1.2)
Total Protein: 7.3 g/dL (ref 6.0–8.3)

## 2023-06-23 LAB — CBC WITH DIFFERENTIAL/PLATELET
Basophils Absolute: 0.1 10*3/uL (ref 0.0–0.1)
Basophils Relative: 0.6 % (ref 0.0–3.0)
Eosinophils Absolute: 0.4 10*3/uL (ref 0.0–0.7)
Eosinophils Relative: 4.5 % (ref 0.0–5.0)
HCT: 39.2 % (ref 39.0–52.0)
Hemoglobin: 13.2 g/dL (ref 13.0–17.0)
Lymphocytes Relative: 24.8 % (ref 12.0–46.0)
Lymphs Abs: 2 10*3/uL (ref 0.7–4.0)
MCHC: 33.7 g/dL (ref 30.0–36.0)
MCV: 88.8 fL (ref 78.0–100.0)
Monocytes Absolute: 0.6 10*3/uL (ref 0.1–1.0)
Monocytes Relative: 7.6 % (ref 3.0–12.0)
Neutro Abs: 5.1 10*3/uL (ref 1.4–7.7)
Neutrophils Relative %: 62.5 % (ref 43.0–77.0)
Platelets: 187 10*3/uL (ref 150.0–400.0)
RBC: 4.42 Mil/uL (ref 4.22–5.81)
RDW: 13.5 % (ref 11.5–15.5)
WBC: 8.2 10*3/uL (ref 4.0–10.5)

## 2023-06-23 LAB — HEMOGLOBIN A1C: Hgb A1c MFr Bld: 9.1 % — ABNORMAL HIGH (ref 4.6–6.5)

## 2023-06-24 ENCOUNTER — Other Ambulatory Visit: Payer: Self-pay | Admitting: Family Medicine

## 2023-06-24 DIAGNOSIS — E1151 Type 2 diabetes mellitus with diabetic peripheral angiopathy without gangrene: Secondary | ICD-10-CM

## 2023-06-26 NOTE — Therapy (Addendum)
 OUTPATIENT OCCUPATIONAL THERAPY NEURO Treatment   Patient Name: Dennis Hopkins MRN: [id number] DOB:[birth year], 63 y.o., male Today's Date: 06/27/2023  PCP: Dr. Antonio REFERRING PROVIDER: Dr. Antonio  END OF SESSION:  OT End of Session - 06/27/23 1024     Visit Number 9    Number of Visits 13    Date for OT Re-Evaluation 07/19/23    Authorization - Visit Number 8    Authorization - Number of Visits 12    OT Start Time 0935    OT Stop Time 1013    OT Time Calculation (min) 38 min    Activity Tolerance Patient tolerated treatment well    Behavior During Therapy [facility] for tasks assessed/performed;Flat affect               OCCUPATIONAL THERAPY DISCHARGE SUMMARY   Current functional level related to goals / functional outcomes: Pt achieved all goals.   Remaining deficits: mildly decreased strength coordination, back pain, mild cogntive deficits   Education / Equipment: Pt was educated regarding : HEP and safety recommendations for return to work. Pt verbalized understanding.   Patient agrees to discharge. Patient goals were met. Patient is being discharged due to being pleased with the current functional level. and returning to work.        Past Medical History:  Diagnosis Date   Acute CVA (cerebrovascular accident) (HCC) 04/12/2023   Acute left-sided low back pain with right-sided sciatica 02/14/2019   Calcified granuloma of lung 09/06/2021   Chest pain 11/11/2015   Chickenpox    Chronic alcohol use 04/12/2023   Coronary artery disease 01/12/2016   Diabetes (HCC) 01/18/2016   Type 2     Diabetes mellitus (HCC) 04/13/2021   Eczema 04/12/2022   Essential hypertension    Exertional angina (HCC)    History of CAD (coronary artery disease) 04/12/2023   HTN (hypertension) 10/20/2015   Hyperlipemia    Hyperlipidemia    Hyperlipidemia LDL goal <70 10/20/2015   Hypokalemia 04/12/2023   Insulin  dependent type 2 diabetes mellitus (HCC) 07/19/2016   Kidney stones  06/21/2011   Lumbar foraminal stenosis 01/05/2022   Morbid obesity (HCC) 04/12/2023   Obesity (BMI 30-39.9) 09/26/2013   Peripheral neuropathic pain 09/06/2021   Peripheral neuropathy 04/12/2023   Preventative health care 09/29/2014   Rash 08/24/2020   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   Snoring 08/24/2020   Tobacco use disorder 09/26/2013   Type 2 diabetes mellitus with diabetic peripheral angiopathy without gangrene, without long-term current use of insulin  (HCC) 09/06/2021   Type 2 diabetes mellitus with hyperglycemia, without long-term current use of insulin  (HCC) 04/13/2021   Urinary frequency 08/24/2020   Past Surgical History:  Procedure Laterality Date   CARDIAC CATHETERIZATION N/A 01/07/2016   Procedure: Left Heart Cath and Coronary Angiography;  Surgeon: Candyce GORMAN Reek, MD;  Location: [facility] INVASIVE CV LAB;  Service: Cardiovascular;  Laterality: N/A;   CORONARY ARTERY BYPASS GRAFT N/A 01/13/2016   Procedure: CORONARY ARTERY BYPASS GRAFTING (CABG) x4 Endoscopic Harvesting of the Right Greater Saphenous Vein;  Surgeon: Elspeth JAYSON Millers, MD;  Location: [facility] OR;  Service: Open Heart Surgery;  Laterality: N/A;   TEE WITHOUT CARDIOVERSION N/A 01/13/2016   Procedure: TRANSESOPHAGEAL ECHOCARDIOGRAM (TEE);  Surgeon: Elspeth JAYSON Millers, MD;  Location: [facility] OR;  Service: Open Heart Surgery;  Laterality: N/A;   TONSILLECTOMY     Patient Active Problem List   Diagnosis Date Noted   Right hand weakness 06/22/2023   Hyperlipemia    Ischemic  cerebrovascular accident (CVA) (HCC) 04/12/2023   History of CAD (coronary artery disease) 04/12/2023   Chronic alcohol use 04/12/2023   Hypokalemia 04/12/2023   Morbid obesity (HCC) 04/12/2023   Peripheral neuropathy 04/12/2023   Eczema 04/12/2022   Lumbar foraminal stenosis 01/05/2022   Type 2 diabetes mellitus with diabetic peripheral angiopathy without gangrene, without long-term current use of insulin  (HCC) 09/06/2021   Calcified granuloma of lung  09/06/2021   Peripheral neuropathic pain 09/06/2021   Type 2 diabetes mellitus with hyperglycemia, without long-term current use of insulin  (HCC) 04/13/2021   Diabetes mellitus (HCC) 04/13/2021   Essential hypertension    Hyperlipidemia    Chickenpox    Urinary frequency 08/24/2020   Snoring 08/24/2020   Rash 08/24/2020   Acute left-sided low back pain with right-sided sciatica 02/14/2019   Insulin  dependent type 2 diabetes mellitus (HCC) 07/19/2016   Diabetes (HCC) 01/18/2016   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   CAD, s/p CABG in 2017 [LIMA-LAD, SVG-OM1, SVG-RPDA] 01/12/2016   Exertional angina (HCC)    Chest pain 11/11/2015   HTN (hypertension) 10/20/2015   Hyperlipidemia LDL goal <70 10/20/2015   Preventative health care 09/29/2014   Obesity (BMI 30-39.9) 09/26/2013   Tobacco use disorder 09/26/2013    ONSET DATE: 04/12/23  REFERRING DIAG:  Diagnosis  I63.9 (ICD-10-CM) - Ischemic cerebrovascular accident (CVA) (HCC)  R29.898 (ICD-10-CM) - Right hand weakness    THERAPY DIAG:  Abnormal posture  Muscle weakness (generalized)  Other abnormalities of gait and mobility  Other lack of coordination  Other disturbances of skin sensation  Visuospatial deficit  Rationale for Evaluation and Treatment: Rehabilitation  SUBJECTIVE:   SUBJECTIVE STATEMENT: Pt reports he is returning to work next week  PERTINENT HISTORY: Per chart 63 y.o. male presented to emergency department 04/12/23 with complaining of right-sided weakness; CTA head which showed left MCA territory infarction along with ICA severe stenosis as well as occlusion of the left vertebral artery. MRI multiple acute infarcts L MCA and PCA territories, old R frontal infarct PMH significant of CAD status post CABG, hypertension, insulin -dependent DM type II, hyperlipidemia, aspirin allergy, chronic alcohol use, and obesity   PRECAUTIONS: Fall   PAIN: 5/10  Are you having pain? Yes: NPRS scale: 5/10 Pain location:  back Pain description: aching Aggravating factors: standing Relieving factors: rest, heat , OT will not address directly as pt is seeing PT for this  FALLS: Has patient fallen in last 6 months? No  LIVING ENVIRONMENT: Lives with: lives alone Lives in: House/apartment Stairs: No Has following equipment at home: Environmental Consultant - 4 wheeled, Wheelchair (manual), and shower chair  PLOF: Independent  PATIENT GOALS: improve use of right hand   OBJECTIVE:  Note: Objective measures were completed at Evaluation unless otherwise noted.  HAND DOMINANCE: Right  ADLs: Overall ADLs: increased time required and difficulty using R dominant hand due to weakness and incoordination Transfers/ambulation related to ADLs: Eating: difficulty feeding self with RUE, and cutting food  Grooming: difficulty shaving pt cut himself UB Dressing: mod I LB Dressing: mod I Toileting: mod i Bathing: mod I  Tub Shower transfers: mod I Equipment: Shower seat with back  IADLs: Shopping: does light shopping  Meal Prep: using microwave  Community mobility: mod I Medication management: keeps  up with own meds Financial management: grandson helps Handwriting: Not legible  MOBILITY STATUS: Independent       UPPER EXTREMITY ROM:    Active ROM Right eval Left eval  Shoulder flexion 125   Shoulder abduction  Shoulder adduction    Shoulder extension    Shoulder internal rotation    Shoulder external rotation    Elbow flexion    Elbow extension -10   Wrist flexion 63   Wrist extension 40   Wrist ulnar deviation    Wrist radial deviation    Wrist pronation    Wrist supination    (Blank rows = not tested)   HAND FUNCTION: Grip strength: Right: 40 lbs; Left: 75 lbs  COORDINATION: 9 Hole Peg test: Right: 1 min 45 sec; Left: 33.34 sec   SENSATION: Mild numbness in RUE  EDEMA: mild edema in RUE    COGNITION: Overall cognitive status:  to be further assessed in a functional  context  VISION: Subjective report: Pt reports he got new glasses but they are not working as well. Baseline vision: Wears glasses for reading only   VISION ASSESSMENT: Visual Fields: per hospital notes right visual field deficits, per gross assessment no significant deficit noted will assess in functional context number cancellation: 2/44 missed Pt was cautioned to exercise caution and double check on the right side when driving, as pt reports MD cleared him to drive      OBSERVATIONS: Pleasant male with speech difficulties is agreeable to OT treatment   TODAY'S TREATMENT:                                                                                                                              DATE: 06/27/23- Pt has a note from his PCP clearing him for return to work on 07/03/22 with weightlifting restriction of 35#. Pt reports his greatest concern regarding return to work is his back pain. Therapist checked progress towards all remaining goals. Gripper set at level 4 to pick up 1 inch blocks for sustained grip. Reviewed green theraband HEP for strengthening 10-15 rpes each, min v.c initally then pt returned demonstration. Arm bike x 6 mins level 3 for conditioning. Pt wrote a paragraph with pen with foam grip with grossly 90-95% legilbility. Pt reports working on this at home. Therapist continues to recommend that pt. 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 Pt verbalized understanding.   06/19/23- continued discussion regarding return to work. Therapist recommends that pt has someone at work who can check off his safety with fork lift use prior to returning to use. Pt states he has talked with his boss about this. Pt reports concerns about lifitng at work due to his back pain. Pt simulated lifting a 28 lbs box and carrying approx 125 ft. then pt lifting box to and from counter from mat 10x. Pt performed without  difficulty yet he reports he feels it in his back.  Upgraded theraband HEP to green and pt performed 10 reps each for increased strength, min v.c  UBE x 6 mins level 3 for conditioning. Pt. was instructed in coordination HEP, see pt  instructions, min v.c and demonstration. Pt practiced writing with improved legibility and letter size with foam grip and practice.  06/06/23 Therapeutic discussion with pt regarding pt plans to return to work. 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 Reports boss could potentially review safety with forklift. Red theraband BUE strengthening 1x10 scap rows, RUE bicep curl, RUE elbow ext, RUE shoulder flex/ext with min vc's and demonstration, education on performing at home as well. Handout provided. Pt performing work simulated tasks with carrying/lifting 19# and 25# box to various heights 1x10, carrying grocery bag task with 8# approx 192ft with reports of feeling minimal discomfort in digits after carrying.  Green theraputty cutting food with R hand cutting to simulate feeding, no difficulty noted.  Pt performing pegged board design with R hand for [facility] with cognitive and visual component by copying design, min vc needed. Pt omitted several pegs but with questioning cues was able to self correct.  Discussion with pt about having friend from work attend next visit as she works in HR and may be able to assist with accomodations.    05/30/23 UBE x 6 mins level 1 for conditioning Hot pack applied to back x 20 mins while perfroming cane exercises and fine motor coordination tasks. No adverse reactions Cane exercises with 3 lbs cane for shoulder flexion, chest press and biceps curls 2 sets of 10 reps, min v.c Pt was instructed in green putty exercises for grip and tip pinch 20 reps each, min v.c. Placing grooved pegs into pegboard with RUE for increased fine motor coordination, mod  difficulty, removing with inhand manipulation, min difficulty/ v.c   05/23/23- Therapist started checking progress towards goals. see goals for progress. Hotpack applied to low back x 20 mins for pain relief, no adverse reactions while performing exercises.  Cane exercises with 2 lbs cane for shoulder flexion, chest pres, biceps curls 10-15 rpes each, min v.c Environmental scanning 14/15 located on first pass, pt located remaining items without v.c Copying small peg design with RUE for increased fine motor coordination, min difficulty and increased time required. Removing with in hand manipulation, min v.c  Placing graded clothespins on vertical target with RUE for sustained pinch 1-8#, min v.c for tip or 3 pt pinch..  05/16/23- Seated Using 2 lbs cane pt performed 20 reps each for shoulder flexion, chest press, and biceps curls, min v.c Hotpack applied to back x 15 mins while seated, for pain no adverse reactions. Copying small peg design with RUE for increased fine motor coordination, min-mod difficulty and increased time required. Flipping playing cards, stacking coins and manipulating in hand for increased fine motor coordiantion, min v.c   UBE x 6 mins level 1 for conditioning.    05/09/23- Cane exercises for shoulder flexion, abduction and biceps curls 10-15 reps each, min v.c  Functional reaching mid range to place large pegs in vertical pegboard with RUE, mod v.c/ mod difficulty and drops Placing and removing graded clothespins from target with RUE for sustained pinch 1-8#, min v.c  Flipping and dealing playing cards with RUE for increased fine motor coordiantion, min v.c  UBE x 6 mins level 1 for conditioning  05/02/23 Pt was instructed in initial HEP for cane exercises in sitting and exercises for wrist, forearm and hand and beginning coordination, min v.c and demonstration. Fit of prefab resting splint was assessed however since pt now has increased function in hand, therapist  recommends pt  does not wear splint so he can use hand. Arm bike x 6 mins level 1 for conditioning 04/26/23 -eval only  PATIENT EDUCATION: Education details: theraband HEP,  green band, review, progress towards goals, safety for return to work. Person educated: Patient Education method: Verbal cues, demonstration, min v.c,  Education comprehension: verbalized understanding, returned demonstration, min v.c   HOME EXERCISE PROGRAM: Cane exercises, beginning, coordination 05/02/23 Green putty 05/30/23 red theraband 06/06/23 coordiantion 06/19/23  GOALS: Goals reviewed with patient? Yes  SHORT TERM GOALS: Target date: 05/25/23  I with initial HEP  Goal status: met 05/23/23  2.  Pt will demonstrate improved fine motor coordination as evidenced by decreasing 9 hole peg test score by 5 secs from initial. Baseline: 1 min 45 secs Goal status: met 44 secs 05/23/23, 30.27 on 06/27/23  3.  Pt will demonstrate understanding of edema control techniques and sensory precautions  Goal status:  met, verbalizes understanding  4.  Pt will demonstrate grossly 90% composite finger flexion with RUE for increased functional use. Baseline: 85% Goal status: met, 05/23/23  5.  Pt will report consistently feeding himself with RUE.  Goal status: met, 05/23/23  6.  I with with adaptive equipment and strategies to maximize pt's safety and I with ADLs/IADLS.  Goal status: met, no AE needed 06/06/23 7. Pt will perform environmental scanning with 90% or better accuracy. Goal status:met 93% 05/23/23  LONG TERM GOALS: Target date: 07/19/23  I with updated HEP. Baseline:  Goal status:  met, 06/27/23  2.  Pt will demonstrate improved fine motor coordination for ADLs as evidenced by decreasing 9 hole peg test score  to 90 secs Pt will demonstrate improved RUE fine motor coordination for ADLS as evidenced by decreassing 9 hole peg test to 40 secs or less. Baseline: 1 min 45 secs, goal met 05/23/23- 44 secs Goal  status: met 44 secs 05/23/23, goal upgraded see above 30.27 secs, 06/27/23  3.  Pt will increase RUE grip strength to 50 lbs for increased functional use. Baseline: 40 lbs Goal status: met 75 lbs 05/30/23, 80 lbs 06/27/23  4.  Pt will resume use of RUE as dominant hand 50% of the time for ADLS/light IADLs.  Goal status: met per pt report 06/27/23  5.  Pt will demonstrate ability to retrieve a 3 lbs object from overhead shelf without drops x 5 reps  Goal status: met 06/27/23  6.  Pt will demonstrate ability to carry an 8 lbs bag of groceries with RUE.  Goal status:  met 06/27/23   ASSESSMENT:  CLINICAL IMPRESSION:  Pt made execelent overall progress. He achieved all goals. Pt has been cleared by his MD to return to work next week. 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 Pt reports he has discussed this and the lifting restriction with his work.   PERFORMANCE DEFICITS: in functional skills including ADLs, IADLs, coordination, dexterity, sensation, edema, ROM, strength, pain, flexibility, Fine motor control, Gross motor control, mobility, balance, endurance, decreased knowledge of precautions, decreased knowledge of use of DME, vision, and UE functional use, cognitive skills including attention, problem solving, safety awareness, thought, and understand, and psychosocial skills including coping strategies, environmental adaptation, habits, interpersonal interactions, and routines and behaviors.   IMPAIRMENTS: are limiting patient from ADLs, IADLs, rest and sleep, work, play, leisure, and social participation.   CO-MORBIDITIES: may have co-morbidities  that affects occupational performance.  Patient will benefit from skilled OT to address above impairments and improve overall function.  MODIFICATION OR ASSISTANCE TO COMPLETE EVALUATION: No modification of tasks or assist  necessary to complete an evaluation.  OT OCCUPATIONAL PROFILE AND HISTORY: Detailed assessment: Review of records and additional review of physical, cognitive, psychosocial history related to current functional performance.  CLINICAL DECISION MAKING: LOW - limited treatment options, no task modification necessary  REHAB POTENTIAL: Good  EVALUATION COMPLEXITY: Low    PLAN:  OT FREQUENCY: 1x/week plus  OT DURATION: 12 weeks (however may wrap up dependent upon progress and insurance limitations)  PLANNED INTERVENTIONS: 02831 OT Re-evaluation, 97535 self care/ADL training, 02889 therapeutic exercise, 97530 therapeutic activity, 97112 neuromuscular re-education, 97140 manual therapy, 97113 aquatic therapy, 97035 ultrasound, 97018 paraffin, 02989 moist heat, 97010 cryotherapy, 97129 Cognitive training (first 15 min), 02869 Cognitive training(each additional 15 min), 02239 Splinting (initial encounter), passive range of motion, functional mobility training, visual/perceptual remediation/compensation, psychosocial skills training, energy conservation, coping strategies training, patient/family education, and DME and/or AE instructions  RECOMMENDED OTHER SERVICES: ST  CONSULTED AND AGREED WITH PLAN OF CARE: Patient  PLAN FOR NEXT SESSION: d/c OT.   Azaria Bartell, OT 06/27/2023, 10:25 AM

## 2023-06-26 NOTE — Therapy (Addendum)
 OUTPATIENT PHYSICAL THERAPY TREATMENT   Patient Name: Dennis Hopkins MRN: [id number] DOB:08-29-1960, 63 y.o., male Today's Date: 06/27/2023  END OF SESSION:  PT End of Session - 06/27/23 1059     Visit Number 12    Number of Visits 13    Date for PT Re-Evaluation 06/27/23    Authorization Type [employer]    PT Start Time 1100    PT Stop Time 1145    PT Time Calculation (min) 45 min    Activity Tolerance Patient tolerated treatment well    Behavior During Therapy [facility] for tasks assessed/performed;Flat affect              Past Medical History:  Diagnosis Date   Acute CVA (cerebrovascular accident) (HCC) 04/12/2023   Acute left-sided low back pain with right-sided sciatica 02/14/2019   Calcified granuloma of lung 09/06/2021   Chest pain 11/11/2015   Chickenpox    Chronic alcohol use 04/12/2023   Coronary artery disease 01/12/2016   Diabetes (HCC) 01/18/2016   Type 2     Diabetes mellitus (HCC) 04/13/2021   Eczema 04/12/2022   Essential hypertension    Exertional angina (HCC)    History of CAD (coronary artery disease) 04/12/2023   HTN (hypertension) 10/20/2015   Hyperlipemia    Hyperlipidemia    Hyperlipidemia LDL goal <70 10/20/2015   Hypokalemia 04/12/2023   Insulin  dependent type 2 diabetes mellitus (HCC) 07/19/2016   Kidney stones 06/21/2011   Lumbar foraminal stenosis 01/05/2022   Morbid obesity (HCC) 04/12/2023   Obesity (BMI 30-39.9) 09/26/2013   Peripheral neuropathic pain 09/06/2021   Peripheral neuropathy 04/12/2023   Preventative health care 09/29/2014   Rash 08/24/2020   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   Snoring 08/24/2020   Tobacco use disorder 09/26/2013   Type 2 diabetes mellitus with diabetic peripheral angiopathy without gangrene, without long-term current use of insulin  (HCC) 09/06/2021   Type 2 diabetes mellitus with hyperglycemia, without long-term current use of insulin  (HCC) 04/13/2021   Urinary frequency 08/24/2020   Past Surgical History:   Procedure Laterality Date   CARDIAC CATHETERIZATION N/A 01/07/2016   Procedure: Left Heart Cath and Coronary Angiography;  Surgeon: Candyce GORMAN Reek, MD;  Location: [facility] INVASIVE CV LAB;  Service: Cardiovascular;  Laterality: N/A;   CORONARY ARTERY BYPASS GRAFT N/A 01/13/2016   Procedure: CORONARY ARTERY BYPASS GRAFTING (CABG) x4 Endoscopic Harvesting of the Right Greater Saphenous Vein;  Surgeon: Elspeth JAYSON Millers, MD;  Location: [facility] OR;  Service: Open Heart Surgery;  Laterality: N/A;   TEE WITHOUT CARDIOVERSION N/A 01/13/2016   Procedure: TRANSESOPHAGEAL ECHOCARDIOGRAM (TEE);  Surgeon: Elspeth JAYSON Millers, MD;  Location: [facility] OR;  Service: Open Heart Surgery;  Laterality: N/A;   TONSILLECTOMY     Patient Active Problem List   Diagnosis Date Noted   Right hand weakness 06/22/2023   Hyperlipemia    Ischemic cerebrovascular accident (CVA) (HCC) 04/12/2023   History of CAD (coronary artery disease) 04/12/2023   Chronic alcohol use 04/12/2023   Hypokalemia 04/12/2023   Morbid obesity (HCC) 04/12/2023   Peripheral neuropathy 04/12/2023   Eczema 04/12/2022   Lumbar foraminal stenosis 01/05/2022   Type 2 diabetes mellitus with diabetic peripheral angiopathy without gangrene, without long-term current use of insulin  (HCC) 09/06/2021   Calcified granuloma of lung 09/06/2021   Peripheral neuropathic pain 09/06/2021   Type 2 diabetes mellitus with hyperglycemia, without long-term current use of insulin  (HCC) 04/13/2021   Diabetes mellitus (HCC) 04/13/2021   Essential hypertension    Hyperlipidemia  Chickenpox    Urinary frequency 08/24/2020   Snoring 08/24/2020   Rash 08/24/2020   Acute left-sided low back pain with right-sided sciatica 02/14/2019   Insulin  dependent type 2 diabetes mellitus (HCC) 07/19/2016   Diabetes (HCC) 01/18/2016   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   CAD, s/p CABG in 2017 [LIMA-LAD, SVG-OM1, SVG-RPDA] 01/12/2016   Exertional angina (HCC)    Chest pain 11/11/2015   HTN  (hypertension) 10/20/2015   Hyperlipidemia LDL goal <70 10/20/2015   Preventative health care 09/29/2014   Obesity (BMI 30-39.9) 09/26/2013   Tobacco use disorder 09/26/2013    PCP: Antonio Cyndee Jamee JONELLE, DO   REFERRING PROVIDER: Antonio Cyndee Jamee JONELLE, DO   REFERRING DIAG: [phone] (ICD-10-CM) - Lumbar foraminal stenosis   THERAPY DIAG:  Abnormal posture  Muscle weakness (generalized)  Other abnormalities of gait and mobility  Other lack of coordination  Other low back pain  RATIONALE FOR EVALUATION AND TREATMENT: Rehabilitation  ONSET DATE: ~1 year   NEXT MD VISIT: PRN - none scheduled   SUBJECTIVE:                                                                                                                                                                                                         SUBJECTIVE STATEMENT: Going back to work next week, back still hurts. The doctor cleared but I have a restriction to not lift more than 35lbs.   PAIN: Are you having pain? Yes: NPRS scale: 5/10 Pain location: B low back, L>R Pain description: sharp Aggravating factors: sitting in unsupported position Relieving factors: heat, back support, meds   PERTINENT HISTORY:  HTN; DM-II; CA s/p CABG x 4 - 2017; Peripheral neuropathic pain; Obesity  PRECAUTIONS: None  RED FLAGS: None  WEIGHT BEARING RESTRICTIONS: No  FALLS:  Has patient fallen in last 6 months? 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   OBJECTIVE: (objective measures completed at initial evaluation unless otherwise dated)  DIAGNOSTIC FINDINGS:  11/20/21 - Lumbar spine MRI: FINDINGS: Segmentation:  Standard.    Alignment:  Facet mediated anterolisthesis of L5 on S1 of 3 mm.   Vertebrae: No fracture, suspicious marrow lesion, or significant marrow edema. Hemangioma in the L5 vertebral body. Small L5 superior endplate Schmorl's node. Multilevel Modic type  2 endplate changes.   Conus medullaris and cauda equina: Conus extends to the L1 level. Conus and cauda equina appear normal.   Paraspinal and other soft tissues: Unremarkable.   Disc levels:   Mild diffuse lumbar disc desiccation. Moderate disc space narrowing at L3-4 and L4-5.   T12-L1: Mild disc bulging without stenosis.   L1-2: Disc bulging without stenosis.   L2-3: Disc bulging and mild facet hypertrophy without stenosis.   L3-4: Disc bulging results in borderline to mild bilateral lateral recess stenosis and mild-to-moderate bilateral neural foraminal stenosis without significant spinal stenosis.   L4-5: Disc bulging results in borderline to mild bilateral lateral recess stenosis and moderate bilateral neural foraminal stenosis without significant spinal stenosis.   L5-S1: Anterolisthesis with bulging uncovered disc and moderate right and severe left facet hypertrophy result in moderate bilateral neural foraminal stenosis without spinal stenosis.   IMPRESSION: 1. Multilevel lumbar disc and facet degeneration with moderate neural foraminal stenosis at L4-5 and L5-S1. 2. No significant spinal stenosis.  11/02/21 - Lumbar spine x-ray: FINDINGS: There is no evidence for lumbar spine fracture. Spinal alignment is normal. There is mild-to-moderate disc space narrowing at L3-L4 and L4-L5, mildly progressed. Mild disc space narrowing at L5-S1 appears stable. Endplate osteophytes are seen throughout. There is facet arthropathy at L5-S1. Soft tissues are within normal limits.   IMPRESSION: 1. No acute fracture or malalignment. 2. Moderate multilevel degenerative changes throughout the spine have mildly progressed compared to 2020.  PATIENT  SURVEYS:  Modified Oswestry 18 / 50 = 36.0 %; 05/16/23 22/50  FOTO lumbar spine 52, predicted 60    SENSATION: Radicular pain down L>R LE to calves B diabetic peripheral neuropathy in B feet  MUSCLE LENGTH: Hamstrings: mod/severe tight L>R ITB: mod/severe tight L>R Piriformis: mod/severe tight L>R Hip flexors: mod tight B Quads: mod tight B Heelcord: NT  POSTURE:  rounded shoulders, forward head, decreased lumbar lordosis, and flexed trunk   PALPATION: Increased muscle tension in bilateral lumbar paraspinals and upper glutes, however patient denies TTP; 05/16/23 tight and sore lumbar region   LUMBAR ROM:   Active  03/28/23 - Eval 05/16/23  Flexion Hands to ankles - HS tightness Mild limitation, HS tightness   Extension 25% limited Mild limitation   Right lateral flexion Hand to fibular head ^ WNL   Left lateral flexion Hand to lateral calf WNL   Right rotation 10% limited   Left rotation 40% limited   (Blank rows = not tested, ^ = increased pain)  LOWER EXTREMITY ROM:    Limited B hip extension and ER   LOWER EXTREMITY MMT:    MMT Right eval Left eval R 04/20/23 L 04/20/23 R 05/16/23 L 05/16/23  Hip flexion 5 4 5  4+    Hip extension 4 4+ 4+ 4+    Hip abduction 4 4 4+ 4+ 5 5  Hip adduction 4+ 4+ 4+ 4+    Hip internal rotation 5 5 5 5     Hip external rotation 5 5 5 5     Knee flexion 5 5 5 5     Knee extension 5 5 5 5     Ankle dorsiflexion 5 4+ 5 5    Ankle plantarflexion 5 5 5 5     Ankle inversion        Ankle eversion         (Blank rows = not tested)  LUMBAR SPECIAL TESTS:  Straight leg raise test: Positive and Slump test: Positive - bilateral  GAIT: Distance walked:  clinic distances Assistive device utilized: None Level of assistance: Complete Independence Gait pattern: step through pattern and trunk flexed   TODAY'S TREATMENT:  06/27/23 Recheck goals  NuStep L5x63mins  Shoulder ext 10# 2x10 On airex cone taps minA  Step ups 6 to airex  Walking on  beam  Seated rows and lat pulls 35# 2x10 Passive stretches for low back and hip/knee    06/19/23 Bike level 4 x 6 minutes Seated row 35# Lats 35# 10# straight arm pulls with cues for posture and core activation 20# AR press with cues for core 10# hip extension 5# hip abduction Tmill push 20 seconds x 3  On airex ball toss On airex eyes closed, head turns Side step on and off airex Supine feet on ball K2C, rotation, small bridge, isometric abs Passive stretch LE's  06/06/23 Nustep L6 x 6 min Prone press up x10 Swimmers x10 Prone shoulder ext 2x10 Prone T 2x10 Forearm/feet plank 3x10 Quadruped child's pose x30 Quadruped row 10# 2x10 Deadlift 10# x10 but pt not performing correctly and using more back Sit<>stand 10# x10 with improved hip activation Modified deadlift in staggered stance x10 Pt reported dizziness after performing this, laid supine and vitals as follows: Initially upon supine: BP: 165/77; HR: 71 BPM (states this is close to his baseline) After 5 min: BP: 141/74; HR 70 BPM (pt feeling better) Supine hamstring stretch with strap x30 R&L Supine piriformis stretch x30 R&L Supine DKTC x30 Supine trunk rotation with arms abducted x30 R&L   05/30/23 Bike L3 x62mins   Seated row 35# 2x10  Lat pulls 35# 2x10  Black tb ext 2x10 Functional box lifts and carry 15lb S2S OHP yellow ball 2x10 Bridges LE on Pball bridges, oblq, K2C Passive HS and K2C stretch   05/23/23 Bike L3 x84mins   Standing shoulder extension 10# 2x10  Seated row 35# 2x10  Lat pulls 35# 2x10  Black tb ext 2x10 Prone press up on elbows 2x10 Prone hip extension 2x10 MH and e-stim 10 mins   05/16/23  Modified Oswestry, objective testing and care planning  TherEx  On MHP for pain control lumbar spine x12 minutes:  HS stretches 2x30 seconds B Piriformis stretches 2x30 seconds B  SKTC 5x10 seconds B   QL stretch 3 way x30 seconds each  Nustep L4--5 x6 minutes BLEs  Standing  lumbar extensions x10 some improvement in pain    05/09/23 Bike L3 x45mins  Seated row 35# 2x10 Lat pull down 35# 2x10 Shoulder ext 10# 2x10 Leg ext 10# 2x10 HS curls 25# 2x10 Leg press 20# 2x10 STS with OHP 2x10 Supine passive stretching HS, SKTC, DKTC, trunk rotations, figure 4     04/26/23 NuStep L5x84mins  STS 2x10 Supine stretching HS, single knee to chest  Feet on ball rotations, small bridges, knees to chest x10 blackTB extension 2x10 Seated row 25# 2x10 Shoulder extension 5# x10, 10# x10    04/20/23  Re-Eval - post-hospitalization for acute CVA LE MMT unchanged or improved  Gait pattern unchanged with no LOB Review of acute care PT/OT/SLP assessment - no indication for f/u PT related to CVA, but OP OT and SLP recommended  THERAPEUTIC EXERCISE: to improve flexibility, strength and mobility.  Demonstration, verbal and tactile cues throughout for technique. Rec Bike - L2 x 6 min Standing TrA + GTB scap retraction + row 10 x 5 Standing TrA + GTB scap retraction + B shoulder extension 10 x 5 Quadruped over peanut ball: Alt hip extension 10 x  3 Alt UE raise 10 x 3 Prone press-up 10 x 3 Bird dog opp UE/LE raise 10 x 3 Quadruped cat/cow (w/o peanut ball) x 10   04/10/23  THERAPEUTIC EXERCISE: to improve flexibility, strength and mobility.  Demonstration, verbal and tactile cues throughout for technique. Rec Bike - L2 x 6 min POE x 1 min POE + alt fwd/OH reach x 10 Quadruped cat/cow x 10 Quad rocking from child's pose to prone press-up x 10  MANUAL THERAPY: To promote normalized muscle tension, improved flexibility, and reduced pain. Skilled palpation and monitoring of soft tissue during DN Trigger Point Dry-Needling  Treatment instructions: Expect mild to moderate muscle soreness. Patient verbalized understanding of these instructions and education. Patient Consent Given: Yes Education handout provided: Yes Muscles treated: B QL, lumbar erector spinae and  multifidi Electrical stimulation performed: No Parameters: N/A Treatment response/outcome: Twitch Response Elicited and Palpable Increase in Muscle Length STM/DTM, manual TPR and pin & stretch to muscles addressed with DN   04/04/23 Manual Therapy: to decrease muscle spasm, pain and improve mobility.  STM to bil thoraco-lumbar PS, glutes, QL and IASTM with foam roll  Therapeutic Exercise: to improve strength and mobility.  Demo, verbal and tactile cues throughout for technique.  SKTC stretch x 30 bil LTR 3x10 both ways Brief review of remainder of HEP Hooklying pelvic tils 10x3 Hooklying marches with core brace x 10 bil Bridges x 10 with TrA   03/28/23 - Eval  THERAPEUTIC EXERCISE: to improve flexibility, strength and mobility.  Demonstration, verbal and tactile cues throughout for technique. SKTC stretch 2 x 30 Hooklying B LTR 2 x 10 POE x 1 min Prone press up x 3 Demonstrated lumbar extension options in standing but not attempted   PATIENT EDUCATION:  Education details: HEP progression - quadruped cat/cow, continue with current HEP, postural awareness, options for OP OT & SLP at [facility], and need for D/C from OP PT if he were to start [facility] services Person educated: Patient Education method: Explanation, Demonstration, and Handouts Education comprehension: verbalized understanding and needs further education  HOME EXERCISE PROGRAM: Access Code: [email or web address] URL: [email or web address] Date: 04/20/2023 Prepared by: Elijah Hidden  Exercises - Supine Single Knee to Chest Stretch  - 2-3 x daily - 7 x weekly - 3 reps - 30 sec hold - Supine Lower Trunk Rotation  - 2-3 x daily - 7 x weekly - 5 reps - 10 sec hold - Static Prone on Elbows  - 2-3 x daily - 7 x weekly - 2 sets - 3 reps - 30 sec hold - Standing Lumbar Extension with Counter  - 2-3 x daily - 7 x weekly - 2 sets - 10 reps - 3 sec hold - Standing Lumbar Extension at Wall - Forearms  - 2-3 x  daily - 7 x weekly - 2 sets - 10 reps - 3 sec hold - Supine Posterior Pelvic Tilt  - 2-3 x daily - 7 x weekly - 2 sets - 10 reps - Supine March  - 1 x daily - 7 x weekly - 2-3 sets - 10 reps - Supine Bridge  - 1 x daily - 7 x weekly - 2-3 sets - 10 reps - Cat Cow  - 1 x daily - 7 x weekly - 2 sets - 10 reps - 5 sec hold   ASSESSMENT:  CLINICAL IMPRESSION: Patient overall is doing well, he reports that he was cleared to go back to work and ready for discharge.  He is returning next Tuesday to work with restrictions for any heavy lifting. He has partially met his goals. Loses his balance with cone taps on airex but able to independently recover using stepping strategies. He is impulsive at times. I personally think he may benefit from ongoing therapies, however he feels work is more important to get back to asap. We worked on some low back and core strengthening as well as balance to wrap things up.   OBJECTIVE IMPAIRMENTS: Abnormal gait, decreased activity tolerance, decreased endurance, decreased knowledge of condition, decreased mobility, difficulty walking, decreased ROM, decreased strength, hypomobility, increased fascial restrictions, impaired perceived functional ability, increased muscle spasms, impaired flexibility, impaired sensation, improper body mechanics, postural dysfunction, and pain.   ACTIVITY LIMITATIONS: carrying, lifting, bending, sitting, standing, squatting, sleeping, stairs, transfers, bathing, dressing, reach over head, locomotion level, and caring for others  PARTICIPATION LIMITATIONS: meal prep, cleaning, laundry, driving, shopping, community activity, occupation, and yard work  PERSONAL FACTORS: Fitness, Past/current experiences, Profession, Time since onset of injury/illness/exacerbation, and 3+ comorbidities: HTN; DM-II; CA s/p CABG x 4 - 2017; Peripheral neuropathic pain; Obesity  are also affecting patient's functional outcome.   REHAB POTENTIAL: Good  CLINICAL  DECISION MAKING: Evolving/moderate complexity  EVALUATION COMPLEXITY: Moderate   GOALS: Goals reviewed with patient? Yes  SHORT TERM GOALS: Target date: 06/06/2023     Patient will be independent with initial HEP to improve outcomes and carryover.  Baseline: Initial HEP provided on eval Goal status: met 06/19/23  2.  Patient will report centralization of radicular symptoms.  Baseline: L>R LE radicular pain down to calf 06/06/23: reports it doesn't go down the calf as often now Goal status: met 06/19/23  LONG TERM GOALS: Target date: 06/27/2023     Patient will be independent with ongoing/advanced HEP for self-management at home.  Baseline:  Goal status: IN PROGRESS 05/16/23  2.  Patient will report 50-75% improvement in low back pain and L LE radicular pain to improve QOL.  Baseline: 5/10, up to 9/10 with L>R LE radicular pain Goal status: IN PROGRESS 05/16/23 50% 06/27/23 MET   3.  Patient to demonstrate ability to achieve and maintain good spinal alignment/posturing and body mechanics needed for daily activities. Baseline: Flexed trunk posture with rounded shoulders and forward head Goal status: IN PROGRESS 06/27/23  4.  Patient will demonstrate functional pain free lumbar ROM to perform ADLs.   Baseline: Refer to above lumbar ROM table Goal status: MET 06/27/23   5.  Patient will demonstrate improved B LE strength to >/= 4+/5 for improved stability and ease of mobility . Baseline: Refer to above MMT table Goal status: MET 05/16/23  6.  Patient will report 60 on lumbar FOTO to demonstrate improved functional ability.  Baseline: 52 Goal status: IN PROGRESS  05/16/23  7. Patient will report </= 24% on Modified Oswestry to demonstrate improved functional ability with decreased pain interference. Baseline: 18 / 50 = 36.0 %  Goal status: NOT MET 06/27/23 22/50= 44%   8.  Patient will report ability to resume walking for wellness program at work without limitation due to LBP or  L LE radiculopathy. Baseline: Patient has been unable to walk for wellness program due to pain Goal status: IN PROGRESS 06/27/23   PLAN:  PT FREQUENCY: 1x/week   PT DURATION: 6 weeks  PLANNED INTERVENTIONS: Therapeutic exercises, Therapeutic activity, Neuromuscular re-education, Balance training, Gait training, Patient/Family education, Self Care, Joint mobilization, Stair training, Aquatic Therapy, Dry Needling, Electrical stimulation, Spinal manipulation, Spinal mobilization,  Cryotherapy, Moist heat, Taping, Traction, Ultrasound, Ionotophoresis 4mg /ml Dexamethasone, Manual therapy, and Re-evaluation  PLAN FOR NEXT SESSION: d/c   PHYSICAL THERAPY DISCHARGE SUMMARY  Visits from Start of Care: 12  Patient agrees to discharge. Patient goals were partially met. Patient is being discharged due to the patient's request. He was cleared by doctor to return to work so wants today to be his last visit.    [place], [place] 06/27/23 11:39 AM

## 2023-06-27 ENCOUNTER — Ambulatory Visit: Payer: PRIVATE HEALTH INSURANCE | Attending: Family Medicine | Admitting: Occupational Therapy

## 2023-06-27 ENCOUNTER — Ambulatory Visit: Payer: PRIVATE HEALTH INSURANCE

## 2023-06-27 ENCOUNTER — Ambulatory Visit: Payer: PRIVATE HEALTH INSURANCE | Admitting: Speech Pathology

## 2023-06-27 DIAGNOSIS — R41842 Visuospatial deficit: Secondary | ICD-10-CM | POA: Insufficient documentation

## 2023-06-27 DIAGNOSIS — R278 Other lack of coordination: Secondary | ICD-10-CM

## 2023-06-27 DIAGNOSIS — R293 Abnormal posture: Secondary | ICD-10-CM | POA: Diagnosis present

## 2023-06-27 DIAGNOSIS — M5459 Other low back pain: Secondary | ICD-10-CM | POA: Diagnosis present

## 2023-06-27 DIAGNOSIS — R208 Other disturbances of skin sensation: Secondary | ICD-10-CM | POA: Diagnosis present

## 2023-06-27 DIAGNOSIS — R2689 Other abnormalities of gait and mobility: Secondary | ICD-10-CM | POA: Diagnosis present

## 2023-06-27 DIAGNOSIS — M6281 Muscle weakness (generalized): Secondary | ICD-10-CM | POA: Diagnosis present

## 2023-06-29 ENCOUNTER — Telehealth: Payer: Self-pay

## 2023-06-29 NOTE — Telephone Encounter (Signed)
 Forms received during last office visit. Completed and faxed.

## 2023-07-03 ENCOUNTER — Ambulatory Visit: Payer: PRIVATE HEALTH INSURANCE

## 2023-07-04 ENCOUNTER — Ambulatory Visit: Payer: PRIVATE HEALTH INSURANCE

## 2023-07-04 ENCOUNTER — Ambulatory Visit: Payer: PRIVATE HEALTH INSURANCE | Admitting: Occupational Therapy

## 2023-07-04 ENCOUNTER — Ambulatory Visit: Payer: PRIVATE HEALTH INSURANCE | Admitting: Speech Pathology

## 2023-08-08 ENCOUNTER — Other Ambulatory Visit: Payer: Self-pay | Admitting: Family Medicine

## 2023-08-08 DIAGNOSIS — M48061 Spinal stenosis, lumbar region without neurogenic claudication: Secondary | ICD-10-CM

## 2023-08-08 NOTE — Telephone Encounter (Signed)
Copied from CRM [id number]. Topic: Clinical - Medication Refill >> Aug 08, 2023  3:38 PM Alcus Dad wrote: Most Recent Primary Care Visit:  Provider: Seabron Spates R  Department: [facility]  Visit Type: OFFICE VISIT  Date: 06/22/2023  Medication: traMADol (ULTRAM) 50 MG tablet methocarbamol (ROBAXIN) 500 MG tablet  Has the patient contacted their pharmacy? No (Agent: If no, request that the patient contact the pharmacy for the refill. If patient does not wish to contact the pharmacy document the reason why and proceed with request.) (Agent: If yes, when and what did the pharmacy advise?)  Is this the correct pharmacy for this prescription? Yes If no, delete pharmacy and type the correct one.  This is the patient's preferred pharmacy:  [facility] [id number] - [place] - [address] [place] [address] Phone: [phone] Fax: [phone]  [facility] [address] [place] [address] Phone: [phone] Fax: [phone]   Has the prescription been filled recently? Yes  Is the patient out of the medication? Yes  Has the patient been seen for an appointment in the last year OR does the patient have an upcoming appointment? Yes  Can we respond through MyChart? No  Agent: Please be advised that Rx refills may take up to 3 business days. We ask that you follow-up with your pharmacy.

## 2023-08-10 ENCOUNTER — Other Ambulatory Visit: Payer: Self-pay | Admitting: Family Medicine

## 2023-08-10 DIAGNOSIS — M48061 Spinal stenosis, lumbar region without neurogenic claudication: Secondary | ICD-10-CM

## 2023-08-10 NOTE — Telephone Encounter (Unsigned)
Copied from CRM [id number]. Topic: Clinical - Medication Refill >> Aug 10, 2023  4:12 PM [place] J wrote: Most Recent Primary Care Visit:  Provider: Seabron Spates R  Department: [facility]  Visit Type: OFFICE VISIT  Date: 06/22/2023  Medication: methocarbamol (ROBAXIN) 500 MG tablet  Has the patient contacted their pharmacy? Yes (Agent: If no, request that the patient contact the pharmacy for the refill. If patient does not wish to contact the pharmacy document the reason why and proceed with request.) (Agent: If yes, when and what did the pharmacy advise?)  Is this the correct pharmacy for this prescription? Yes If no, delete pharmacy and type the correct one.  This is the patient's preferred pharmacy:  [facility] [id number] - [place], [place] - [address] [place] [address] Phone: [phone] Fax: [phone]  [facility] [address] [place] [address] Phone: [phone] Fax: [phone]   Has the prescription been filled recently? No  Is the patient out of the medication? Yes  Has the patient been seen for an appointment in the last year OR does the patient have an upcoming appointment? Yes  Can we respond through MyChart? Yes  Agent: Please be advised that Rx refills may take up to 3 business days. We ask that you follow-up with your pharmacy.

## 2023-08-10 NOTE — Telephone Encounter (Signed)
Requesting: tramadol 50mg   Contract: None UDS: None Last Visit: 06/22/23 Next Visit: None Last Refill: 06/22/23 #90 and 0RF   Please Advise

## 2023-08-11 ENCOUNTER — Other Ambulatory Visit: Payer: Self-pay | Admitting: Family Medicine

## 2023-08-11 MED ORDER — METHOCARBAMOL 500 MG PO TABS
1000.0000 mg | ORAL_TABLET | Freq: Four times a day (QID) | ORAL | 2 refills | Status: DC | PRN
Start: 2023-08-11 — End: 2023-10-13

## 2023-09-18 ENCOUNTER — Other Ambulatory Visit: Payer: Self-pay | Admitting: Family Medicine

## 2023-09-18 DIAGNOSIS — M48061 Spinal stenosis, lumbar region without neurogenic claudication: Secondary | ICD-10-CM

## 2023-09-18 NOTE — Telephone Encounter (Signed)
 Requesting: Tramadol Contract: n/a UDS: n/a Last OV: 06/22/2023 Next OV: n/a Last Refill: 08/10/2023, #90--0 RF Database:   Please advise

## 2023-09-24 NOTE — Progress Notes (Signed)
 I agree with the above plan

## 2023-10-11 ENCOUNTER — Encounter: Payer: Self-pay | Admitting: Family Medicine

## 2023-10-13 ENCOUNTER — Other Ambulatory Visit: Payer: Self-pay | Admitting: Family Medicine

## 2023-10-13 DIAGNOSIS — M48061 Spinal stenosis, lumbar region without neurogenic claudication: Secondary | ICD-10-CM

## 2023-10-27 ENCOUNTER — Other Ambulatory Visit: Payer: Self-pay | Admitting: Family Medicine

## 2023-10-27 DIAGNOSIS — M48061 Spinal stenosis, lumbar region without neurogenic claudication: Secondary | ICD-10-CM

## 2023-10-27 NOTE — Telephone Encounter (Signed)
 Requesting: Tramadol  Contract: n/a UDS: n/a Last OV: 06/22/2023 Next OV: n/a Last Refill: 09/19/2023, #90--0 RF Database:   Please advise

## 2023-11-08 ENCOUNTER — Other Ambulatory Visit: Payer: Self-pay | Admitting: Family Medicine

## 2023-11-21 ENCOUNTER — Other Ambulatory Visit: Payer: Self-pay | Admitting: Family Medicine

## 2023-11-21 DIAGNOSIS — I10 Essential (primary) hypertension: Secondary | ICD-10-CM

## 2023-11-27 ENCOUNTER — Encounter: Payer: Self-pay | Admitting: Family Medicine

## 2023-11-27 ENCOUNTER — Ambulatory Visit (INDEPENDENT_AMBULATORY_CARE_PROVIDER_SITE_OTHER): Payer: PRIVATE HEALTH INSURANCE | Admitting: Family Medicine

## 2023-11-27 VITALS — BP 151/59 | HR 58 | Temp 98.8°F | Resp 16 | Ht 70.0 in | Wt 239.8 lb

## 2023-11-27 DIAGNOSIS — M48061 Spinal stenosis, lumbar region without neurogenic claudication: Secondary | ICD-10-CM | POA: Diagnosis not present

## 2023-11-27 DIAGNOSIS — E119 Type 2 diabetes mellitus without complications: Secondary | ICD-10-CM

## 2023-11-27 DIAGNOSIS — Z794 Long term (current) use of insulin: Secondary | ICD-10-CM

## 2023-11-27 DIAGNOSIS — I639 Cerebral infarction, unspecified: Secondary | ICD-10-CM

## 2023-11-27 DIAGNOSIS — I2510 Atherosclerotic heart disease of native coronary artery without angina pectoris: Secondary | ICD-10-CM

## 2023-11-27 DIAGNOSIS — Z951 Presence of aortocoronary bypass graft: Secondary | ICD-10-CM

## 2023-11-27 DIAGNOSIS — E1151 Type 2 diabetes mellitus with diabetic peripheral angiopathy without gangrene: Secondary | ICD-10-CM | POA: Diagnosis not present

## 2023-11-27 DIAGNOSIS — I10 Essential (primary) hypertension: Secondary | ICD-10-CM

## 2023-11-27 DIAGNOSIS — E785 Hyperlipidemia, unspecified: Secondary | ICD-10-CM

## 2023-11-27 MED ORDER — TRAMADOL HCL 50 MG PO TABS: ORAL_TABLET | ORAL | 0 refills | Status: AC

## 2023-11-27 MED ORDER — CLOPIDOGREL BISULFATE 75 MG PO TABS: 75.0000 mg | ORAL_TABLET | Freq: Every day | ORAL | 1 refills | Status: DC

## 2023-11-27 MED ORDER — METHOCARBAMOL 500 MG PO TABS: 1000.0000 mg | ORAL_TABLET | Freq: Four times a day (QID) | ORAL | 1 refills | Status: AC | PRN

## 2023-11-27 NOTE — Progress Notes (Signed)
 Established Patient Office Visit  Subjective   Patient ID: Dennis Hopkins, male    DOB: Jul 17, 1960  Age: 63 y.o. MRN: [id number]  Chief Complaint  Patient presents with   Back Pain    Here to discuss chronic back pain    HPI Discussed the use of AI scribe software for clinical note transcription with the patient, who gave verbal consent to proceed.  History of Present Illness Dennis Hopkins is a 63 year old male with diabetes and back pain who presents for pain management consultation.  He experiences worsening back pain that radiates down his left leg, stopping in the groin and continuing to the toes. The pain is exacerbated by standing and working, and has been progressively worsening. He completed physical therapy in mid-January and is considering chiropractic care for additional relief.  He has a history of diabetes, with an A1c of 9.1 as of January. He has not seen an endocrinologist recently. He experiences neuropathy in his feet, which is a persistent issue.  He is currently taking tramadol  every six hours, sometimes doubling the dose in the morning, and methocarbamol , which he finds somewhat helpful. He is concerned about the impact of stronger pain medications on his ability to work, as he operates Sales promotion account executive.  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 The pain affects his daily activities.   Patient Active Problem List   Diagnosis Date Noted   Right hand weakness 06/22/2023   Hyperlipemia    Ischemic cerebrovascular accident (CVA) (HCC) 04/12/2023   History of CAD (coronary artery disease) 04/12/2023   Chronic alcohol use 04/12/2023   Hypokalemia 04/12/2023   Morbid obesity (HCC) 04/12/2023   Peripheral neuropathy 04/12/2023   Acute CVA (cerebrovascular accident) (HCC) 04/12/2023   Eczema 04/12/2022   Lumbar foraminal stenosis 01/05/2022   Type 2 diabetes mellitus with diabetic peripheral angiopathy without gangrene, without  long-term current use of insulin  (HCC) 09/06/2021   Calcified granuloma of lung 09/06/2021   Peripheral neuropathic pain 09/06/2021   Type 2 diabetes mellitus with hyperglycemia, without long-term current use of insulin  (HCC) 04/13/2021   Diabetes mellitus (HCC) 04/13/2021   Essential hypertension    Hyperlipidemia    Chickenpox    Urinary frequency 08/24/2020   Snoring 08/24/2020   Rash 08/24/2020   Acute left-sided low back pain with right-sided sciatica 02/14/2019   Insulin  dependent type 2 diabetes mellitus (HCC) 07/19/2016   Diabetes (HCC) 01/18/2016   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   CAD, s/p CABG in 2017 [LIMA-LAD, SVG-OM1, SVG-RPDA] 01/12/2016   Exertional angina (HCC)    Chest pain 11/11/2015   HTN (hypertension) 10/20/2015   Hyperlipidemia LDL goal <70 10/20/2015   Preventative health care 09/29/2014   Obesity (BMI 30-39.9) 09/26/2013   Tobacco use disorder 09/26/2013   Past Medical History:  Diagnosis Date   Acute CVA (cerebrovascular accident) (HCC) 04/12/2023   Acute left-sided low back pain with right-sided sciatica 02/14/2019   Calcified granuloma of lung 09/06/2021   Chest pain 11/11/2015   Chickenpox    Chronic alcohol use 04/12/2023   Coronary artery disease 01/12/2016   Diabetes (HCC) 01/18/2016   Type 2     Diabetes mellitus (HCC) 04/13/2021   Eczema 04/12/2022   Essential hypertension    Exertional angina (HCC)    History of CAD (coronary artery disease) 04/12/2023   HTN (hypertension) 10/20/2015   Hyperlipemia    Hyperlipidemia    Hyperlipidemia LDL goal <70 10/20/2015  Hypokalemia 04/12/2023   Insulin  dependent type 2 diabetes mellitus (HCC) 07/19/2016   Ischemic cerebrovascular accident (CVA) (HCC) 04/12/2023   Lumbar foraminal stenosis 01/05/2022   Morbid obesity (HCC) 04/12/2023   Obesity (BMI 30-39.9) 09/26/2013   Peripheral neuropathic pain 09/06/2021   Peripheral neuropathy 04/12/2023   Preventative health care 09/29/2014   Rash 08/24/2020    Right hand weakness 06/22/2023   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   Snoring 08/24/2020   Tobacco use disorder 09/26/2013   Type 2 diabetes mellitus with diabetic peripheral angiopathy without gangrene, without long-term current use of insulin  (HCC) 09/06/2021   Type 2 diabetes mellitus with hyperglycemia, without long-term current use of insulin  (HCC) 04/13/2021   Urinary frequency 08/24/2020   Past Surgical History:  Procedure Laterality Date   CARDIAC CATHETERIZATION N/A 01/07/2016   Procedure: Left Heart Cath and Coronary Angiography;  Surgeon: Lucendia Rusk, MD;  Location: [facility] INVASIVE CV LAB;  Service: Cardiovascular;  Laterality: N/A;   CORONARY ARTERY BYPASS GRAFT N/A 01/13/2016   Procedure: CORONARY ARTERY BYPASS GRAFTING (CABG) x4 Endoscopic Harvesting of the Right Greater Saphenous Vein;  Surgeon: Zelphia Higashi, MD;  Location: [facility] OR;  Service: Open Heart Surgery;  Laterality: N/A;   TEE WITHOUT CARDIOVERSION N/A 01/13/2016   Procedure: TRANSESOPHAGEAL ECHOCARDIOGRAM (TEE);  Surgeon: Zelphia Higashi, MD;  Location: [facility] OR;  Service: Open Heart Surgery;  Laterality: N/A;   TONSILLECTOMY     Social History   Tobacco Use   Smoking status: Former    Current packs/day: 0.00    Average packs/day: 2.0 packs/day for 35.0 years (70.0 ttl pk-yrs)    Types: Cigarettes    Start date: 12/22/1978    Quit date: 12/21/2013    Years since quitting: 9.9   Smokeless tobacco: Never  Vaping Use   Vaping status: Never Used  Substance Use Topics   Alcohol use: Yes    Alcohol/week: 0.0 standard drinks of alcohol    Comment: 1-2 drinks per night- wife reports he drinks more    Drug use: No   Social History   Socioeconomic History   Marital status: Widowed    Spouse name: Not on file   Number of children: 1   Years of education: Not on file   Highest education level: Not on file  Occupational History   Not on file  Tobacco Use   Smoking status: Former    Current packs/day: 0.00     Average packs/day: 2.0 packs/day for 35.0 years (70.0 ttl pk-yrs)    Types: Cigarettes    Start date: 12/22/1978    Quit date: 12/21/2013    Years since quitting: 9.9   Smokeless tobacco: Never  Vaping Use   Vaping status: Never Used  Substance and Sexual Activity   Alcohol use: Yes    Alcohol/week: 0.0 standard drinks of alcohol    Comment: 1-2 drinks per night- wife reports he drinks more    Drug use: No   Sexual activity: Yes    Partners: Female  Other Topics Concern   Not on file  Social History Narrative   Not on file   Social Drivers of Health   Financial Resource Strain: Low Risk  (11/22/2021)   Overall Financial Resource Strain (CARDIA)    Difficulty of Paying Living Expenses: Not very hard  Food Insecurity: Not on file  Transportation Needs: Not on file  Physical Activity: Not on file  Stress: Not on file  Social Connections: Not on file  Intimate Partner  Violence: Not on file   Family Status  Relation Name Status   Mother  Deceased   Father  Deceased   Brother  Alive   Sister  Alive   Sister  Alive  No partnership data on file   Family History  Problem Relation Age of Onset   Arthritis Mother    Diabetes Mother    Arthritis Father    Hyperlipidemia Father    Diabetes Father    Diabetes Brother    Allergies  Allergen Reactions   Asa [Aspirin] Anaphylaxis, Swelling and Other (See Comments)    Lips swelling      Review of Systems  Constitutional:  Negative for fever and malaise/fatigue.  HENT:  Negative for congestion.   Eyes:  Negative for blurred vision.  Respiratory:  Negative for shortness of breath.   Cardiovascular:  Negative for chest pain, palpitations and leg swelling.  Gastrointestinal:  Negative for abdominal pain, blood in stool and nausea.  Genitourinary:  Negative for dysuria and frequency.  Musculoskeletal:  Positive for back pain. Negative for falls.  Skin:  Negative for rash.  Neurological:  Negative for dizziness, loss of  consciousness and headaches.  Endo/Heme/Allergies:  Negative for environmental allergies.  Psychiatric/Behavioral:  Negative for depression. The patient is not nervous/anxious.       Objective:     BP (!) 151/59 (BP Location: Left Arm, Patient Position: Sitting, Cuff Size: Large)   Pulse (!) 58   Temp 98.8 F (37.1 C) (Oral)   Resp 16   Ht 5\' 10"  (1.778 m)   Wt 239 lb 12.8 oz (108.8 kg)   SpO2 99%   BMI 34.41 kg/m  BP Readings from Last 3 Encounters:  11/27/23 (!) 151/59  06/22/23 118/62  05/15/23 118/68   Wt Readings from Last 3 Encounters:  11/27/23 239 lb 12.8 oz (108.8 kg)  06/22/23 228 lb 6.4 oz (103.6 kg)  05/15/23 222 lb (100.7 kg)   SpO2 Readings from Last 3 Encounters:  11/27/23 99%  06/22/23 98%  05/15/23 98%      Physical Exam Vitals and nursing note reviewed.  Constitutional:      General: He is not in acute distress.    Appearance: Normal appearance. He is well-developed.  HENT:     Head: Normocephalic and atraumatic.  Eyes:     General: No scleral icterus.       Right eye: No discharge.        Left eye: No discharge.  Cardiovascular:     Rate and Rhythm: Normal rate and regular rhythm.     Heart sounds: No murmur heard. Pulmonary:     Effort: Pulmonary effort is normal. No respiratory distress.     Breath sounds: Normal breath sounds.  Musculoskeletal:        General: Normal range of motion.     Cervical back: Normal range of motion and neck supple.     Right lower leg: No edema.     Left lower leg: No edema.  Skin:    General: Skin is warm and dry.  Neurological:     Mental Status: He is alert and oriented to person, place, and time.  Psychiatric:        Mood and Affect: Mood normal.        Behavior: Behavior normal.        Thought Content: Thought content normal.        Judgment: Judgment normal.      No results found for  any visits on 11/27/23.  Last CBC Lab Results  Component Value Date   WBC 8.2 06/22/2023   HGB 13.2  06/22/2023   HCT 39.2 06/22/2023   MCV 88.8 06/22/2023   MCH 28.8 04/13/2023   RDW 13.5 06/22/2023   PLT 187.0 06/22/2023   Last metabolic panel Lab Results  Component Value Date   GLUCOSE 168 (H) 06/22/2023   NA 139 06/22/2023   K 4.4 06/22/2023   CL 99 06/22/2023   CO2 29 06/22/2023   BUN 16 06/22/2023   CREATININE 0.91 06/22/2023   GFR 90.48 06/22/2023   CALCIUM  9.6 06/22/2023   PHOS 3.3 04/15/2023   PROT 7.3 06/22/2023   ALBUMIN  4.3 06/22/2023   BILITOT 0.5 06/22/2023   ALKPHOS 58 06/22/2023   AST 14 06/22/2023   ALT 10 06/22/2023   ANIONGAP 12 04/15/2023   Last lipids Lab Results  Component Value Date   CHOL 164 06/22/2023   HDL 38.40 (L) 06/22/2023   LDLCALC 94 06/22/2023   LDLDIRECT 173.0 02/07/2022   TRIG 157.0 (H) 06/22/2023   CHOLHDL 4 06/22/2023   Last hemoglobin A1c Lab Results  Component Value Date   HGBA1C 9.1 (H) 06/22/2023   Last thyroid  functions Lab Results  Component Value Date   TSH 1.39 02/07/2022   Last vitamin D No results found for: "25OHVITD2", "25OHVITD3", "VD25OH" Last vitamin B12 and Folate Lab Results  Component Value Date   VITAMINB12 322 02/17/2021   FOLATE 11.6 02/17/2021      The ASCVD Risk score (Arnett DK, et al., 2019) failed to calculate for the following reasons:   Risk score cannot be calculated because patient has a medical history suggesting prior/existing ASCVD    Assessment & Plan:   Problem List Items Addressed This Visit       Unprioritized   HTN (hypertension)   Relevant Orders   CBC with Differential/Platelet   Lipid panel   S/P CABG x 4   Relevant Orders   CBC with Differential/Platelet   Comprehensive metabolic panel with GFR   Lipid panel   Lumbar foraminal stenosis - Primary   Relevant Medications   traMADol  (ULTRAM ) 50 MG tablet   methocarbamol  (ROBAXIN ) 500 MG tablet   Other Relevant Orders   Ambulatory referral to Pain Clinic   Ambulatory referral to Chiropractic   Hyperlipidemia  LDL goal <70   Relevant Orders   CBC with Differential/Platelet   Comprehensive metabolic panel with GFR   Lipid panel   CAD, s/p CABG in 2017 [LIMA-LAD, SVG-OM1, SVG-RPDA]   Relevant Medications   clopidogrel  (PLAVIX ) 75 MG tablet   Type 2 diabetes mellitus with diabetic peripheral angiopathy without gangrene, without long-term current use of insulin  (HCC)   Relevant Orders   CBC with Differential/Platelet   Comprehensive metabolic panel with GFR   Hemoglobin A1c   Lipid panel   Microalbumin / creatinine urine ratio   Ambulatory referral to Endocrinology   Ischemic cerebrovascular accident (CVA) (HCC)   Relevant Orders   CBC with Differential/Platelet   Comprehensive metabolic panel with GFR   Lipid panel   Hyperlipemia   Insulin  dependent type 2 diabetes mellitus (HCC)   Lab Results  Component Value Date   HGBA1C 9.1 (H) 06/22/2023         Essential hypertension   Well controlled, no changes to meds. Encouraged heart healthy diet such as the DASH diet and exercise as tolerated.        Acute CVA (cerebrovascular accident) (HCC)  Finished PT  Pt is working  Only deficit --- slurred speech      Assessment and Plan Assessment & Plan Chronic Back Pain with Lumbar Spinal Stenosis   Chronic back pain from lumbar spinal stenosis is worsening, with radicular pain down the left leg indicating nerve involvement. He prefers conservative management over surgery. Current pain management with tramadol  and methocarbamol  is inadequate. He is open to chiropractic care and medical massage as adjunct therapies. Increase tramadol  dosage to 1-2 tablets four times a day. Refer to a pain management specialist and a chiropractor. Discuss potential for medical massage if covered by insurance.  Type 2 Diabetes Mellitus   Type 2 diabetes mellitus is poorly controlled, with a previous A1c of 9.1% in January. He has not seen an endocrinologist recently and is considering an insulin  pump. Poor  diabetes control is a barrier to potential spinal stenosis surgery. Glycemic control is crucial for managing back pain and future interventions. Order an A1c test to assess current control and consider referral to an endocrinologist for diabetes management and insulin  pump evaluation.  Peripheral Neuropathy   Peripheral neuropathy in the feet, likely due to poorly controlled diabetes, contributes to discomfort and reduced quality of life. Address neuropathy management in conjunction with diabetes treatment.    Return if symptoms worsen or fail to improve.    Oval Cavazos R Lowne Chase, DO

## 2023-11-27 NOTE — Assessment & Plan Note (Signed)
 Lab Results  Component Value Date   HGBA1C 9.1 (H) 06/22/2023

## 2023-11-27 NOTE — Assessment & Plan Note (Signed)
 Well controlled, no changes to meds. Encouraged heart healthy diet such as the DASH diet and exercise as tolerated.

## 2023-11-27 NOTE — Assessment & Plan Note (Signed)
 Finished PT  Pt is working  Only deficit --- slurred speech

## 2023-11-27 NOTE — Patient Instructions (Signed)
 Stroke Prevention Some medical conditions and behaviors can lead to a higher chance of having a stroke. You can help prevent a stroke by eating healthy, exercising, not smoking, and managing any medical conditions you have. Stroke is a leading cause of functional impairment. Primary prevention is particularly important because a majority of strokes are first-time events. Stroke changes the lives of not only those who experience a stroke but also their family and other caregivers. How can this condition affect me? A stroke is a medical emergency and should be treated right away. A stroke can lead to brain damage and can sometimes be life-threatening. If a person gets medical treatment right away, there is a better chance of surviving and recovering from a stroke. What can increase my risk? The following medical conditions may increase your risk of a stroke: Cardiovascular disease. High blood pressure (hypertension). Diabetes. High cholesterol. Sickle cell disease. Blood clotting disorders (hypercoagulable state). Obesity. Sleep disorders (obstructive sleep apnea). Other risk factors include: Being older than age 66. Having a history of blood clots, stroke, or mini-stroke (transient ischemic attack, TIA). Genetic factors, such as race, ethnicity, or a family history of stroke. Smoking cigarettes or using other tobacco products. Taking birth control pills, especially if you also use tobacco. Heavy use of alcohol or drugs, especially cocaine and methamphetamine. Physical inactivity. What actions can I take to prevent this? Manage your health conditions High cholesterol levels. Eating a healthy diet is important for preventing high cholesterol. If cholesterol cannot be managed through diet alone, you may need to take medicines. Take any prescribed medicines to control your cholesterol as told by your health care provider. Hypertension. To reduce your risk of stroke, try to keep your blood  pressure below 130/80. Eating a healthy diet and exercising regularly are important for controlling blood pressure. If these steps are not enough to manage your blood pressure, you may need to take medicines. Take any prescribed medicines to control hypertension as told by your health care provider. Ask your health care provider if you should monitor your blood pressure at home. Have your blood pressure checked every year, even if your blood pressure is normal. Blood pressure increases with age and some medical conditions. Diabetes. Eating a healthy diet and exercising regularly are important parts of managing your blood sugar (glucose). If your blood sugar cannot be managed through diet and exercise, you may need to take medicines. Take any prescribed medicines to control your diabetes as told by your health care provider. Get evaluated for obstructive sleep apnea. Talk to your health care provider about getting a sleep evaluation if you snore a lot or have excessive sleepiness. Make sure that any other medical conditions you have, such as atrial fibrillation or atherosclerosis, are managed. Nutrition Follow instructions from your health care provider about what to eat or drink to help manage your health condition. These instructions may include: Reducing your daily calorie intake. Limiting how much salt (sodium) you use to 1,500 milligrams (mg) each day. Using only healthy fats for cooking, such as olive oil, canola oil, or sunflower oil. Eating healthy foods. You can do this by: Choosing foods that are high in fiber, such as whole grains, and fresh fruits and vegetables. Eating at least 5 servings of fruits and vegetables a day. Try to fill one-half of your plate with fruits and vegetables at each meal. Choosing lean protein foods, such as lean cuts of meat, poultry without skin, fish, tofu, beans, and nuts. Eating low-fat dairy products. Avoiding  foods that are high in sodium. This can help  lower blood pressure. Avoiding foods that have saturated fat, trans fat, and cholesterol. This can help prevent high cholesterol. Avoiding processed and prepared foods. Counting your daily carbohydrate intake.  Lifestyle If you drink alcohol: Limit how much you have to: 0-1 drink a day for women who are not pregnant. 0-2 drinks a day for men. Know how much alcohol is in your drink. In the [place] one drink equals one 12 oz bottle of beer ( ), one 5 oz glass of wine ( ), or one 1 oz glass of hard liquor (44mL). Do not use any products that contain nicotine or tobacco. These products include cigarettes, chewing tobacco, and vaping devices, such as e-cigarettes. If you need help quitting, ask your health care provider. Avoid secondhand smoke. Do not use drugs. Activity  Try to stay at a healthy weight. Get at least 30 minutes of exercise on most days, such as: Fast walking. Biking. Swimming. Medicines Take over-the-counter and prescription medicines only as told by your health care provider. Aspirin or blood thinners (antiplatelets or anticoagulants) may be recommended to reduce your risk of forming blood clots that can lead to stroke. Avoid taking birth control pills. Talk to your health care provider about the risks of taking birth control pills if: You are over 71 years old. You smoke. You get very bad headaches. You have had a blood clot. Where to find more information American Stroke Association: www.strokeassociation.org Get help right away if: You or a loved one has any symptoms of a stroke. "BE FAST" is an easy way to remember the main warning signs of a stroke: B - Balance. Signs are dizziness, sudden trouble walking, or loss of balance. E - Eyes. Signs are trouble seeing or a sudden change in vision. F - Face. Signs are sudden weakness or numbness of the face, or the face or eyelid drooping on one side. A - Arms. Signs are weakness or numbness in an arm. This happens  suddenly and usually on one side of the body. S - Speech. Signs are sudden trouble speaking, slurred speech, or trouble understanding what people say. T - Time. Time to call emergency services. Write down what time symptoms started. You or a loved one has other signs of a stroke, such as: A sudden, severe headache with no known cause. Nausea or vomiting. Seizure. These symptoms may represent a serious problem that is an emergency. Do not wait to see if the symptoms will go away. Get medical help right away. Call your local emergency services (911 in the [place] Do not drive yourself to the hospital. Summary You can help to prevent a stroke by eating healthy, exercising, not smoking, limiting alcohol intake, and managing any medical conditions you may have. Do not use any products that contain nicotine or tobacco. These include cigarettes, chewing tobacco, and vaping devices, such as e-cigarettes. If you need help quitting, ask your health care provider. Remember "BE FAST" for warning signs of a stroke. Get help right away if you or a loved one has any of these signs. This information is not intended to replace advice given to you by your health care provider. Make sure you discuss any questions you have with your health care provider. Document Revised: 05/09/2022 Document Reviewed: 05/09/2022 Elsevier Patient Education  2024 [employer].

## 2023-11-28 ENCOUNTER — Ambulatory Visit: Payer: Self-pay | Admitting: Family Medicine

## 2023-11-28 DIAGNOSIS — N289 Disorder of kidney and ureter, unspecified: Secondary | ICD-10-CM

## 2023-11-28 DIAGNOSIS — E785 Hyperlipidemia, unspecified: Secondary | ICD-10-CM

## 2023-11-28 LAB — COMPREHENSIVE METABOLIC PANEL WITH GFR
AG Ratio: 1.6 (calc) (ref 1.0–2.5)
ALT: 12 U/L (ref 9–46)
AST: 12 U/L (ref 10–35)
Albumin: 4.4 g/dL (ref 3.6–5.1)
Alkaline phosphatase (APISO): 56 U/L (ref 35–144)
BUN: 19 mg/dL (ref 7–25)
CO2: 28 mmol/L (ref 20–32)
Calcium: 9.6 mg/dL (ref 8.6–10.3)
Chloride: 105 mmol/L (ref 98–110)
Creat: 1.33 mg/dL (ref 0.70–1.35)
Globulin: 2.8 g/dL (ref 1.9–3.7)
Glucose, Bld: 120 mg/dL — ABNORMAL HIGH (ref 65–99)
Potassium: 4.7 mmol/L (ref 3.5–5.3)
Sodium: 141 mmol/L (ref 135–146)
Total Bilirubin: 0.4 mg/dL (ref 0.2–1.2)
Total Protein: 7.2 g/dL (ref 6.1–8.1)
eGFR: 60 mL/min/{1.73_m2} (ref >=60)

## 2023-11-28 LAB — CBC WITH DIFFERENTIAL/PLATELET
Absolute Lymphocytes: 3267 {cells}/uL (ref 850–3900)
Absolute Monocytes: 747 {cells}/uL (ref 200–950)
Basophils Absolute: 63 {cells}/uL (ref 0–200)
Basophils Relative: 0.7 %
Eosinophils Absolute: 405 {cells}/uL (ref 15–500)
Eosinophils Relative: 4.5 %
HCT: 42.2 % (ref 38.5–50.0)
Hemoglobin: 13.8 g/dL (ref 13.2–17.1)
MCH: 28.8 pg (ref 27.0–33.0)
MCHC: 32.7 g/dL (ref 32.0–36.0)
MCV: 88.1 fL (ref 80.0–100.0)
MPV: 10.5 fL (ref 7.5–12.5)
Monocytes Relative: 8.3 %
Neutro Abs: 4518 {cells}/uL (ref 1500–7800)
Neutrophils Relative %: 50.2 %
Platelets: 202 10*3/uL (ref 140–400)
RBC: 4.79 10*6/uL (ref 4.20–5.80)
RDW: 13.8 % (ref 11.0–15.0)
Total Lymphocyte: 36.3 %
WBC: 9 10*3/uL (ref 3.8–10.8)

## 2023-11-28 LAB — HEMOGLOBIN A1C
Hgb A1c MFr Bld: 8.3 % — ABNORMAL HIGH (ref <5.7)
Mean Plasma Glucose: 192 mg/dL
eAG (mmol/L): 10.6 mmol/L

## 2023-11-28 LAB — MICROALBUMIN / CREATININE URINE RATIO
Creatinine, Urine: 242 mg/dL (ref 20–320)
Microalb Creat Ratio: 110 mg/g{creat} — ABNORMAL HIGH (ref <30)
Microalb, Ur: 26.7 mg/dL

## 2023-11-28 LAB — LIPID PANEL
Cholesterol: 217 mg/dL — ABNORMAL HIGH (ref <200)
HDL: 45 mg/dL (ref >=40)
LDL Cholesterol (Calc): 130 mg/dL — ABNORMAL HIGH
Non-HDL Cholesterol (Calc): 172 mg/dL — ABNORMAL HIGH (ref <130)
Total CHOL/HDL Ratio: 4.8 (calc) (ref <5.0)
Triglycerides: 275 mg/dL — ABNORMAL HIGH (ref <150)

## 2023-11-28 MED ORDER — ROSUVASTATIN CALCIUM 20 MG PO TABS: ORAL_TABLET | ORAL | 3 refills | Status: AC

## 2023-11-30 ENCOUNTER — Telehealth: Payer: Self-pay | Admitting: Family Medicine

## 2023-11-30 NOTE — Telephone Encounter (Signed)
 Copied from CRM [id number]. Topic: Referral - Question >> Nov 30, 2023  9:34 AM Dewanda Foots wrote: Reason for CRM: Simonne Dubonnet from [facility] received an endocrinology referral but they need a copy of the insurance card faxed over as well please.  Fax-[phone] [phone]

## 2023-11-30 NOTE — Telephone Encounter (Signed)
 I tried to print to fax but no luck. Fax # [phone]

## 2023-12-01 ENCOUNTER — Telehealth: Payer: Self-pay

## 2023-12-01 NOTE — Telephone Encounter (Signed)
Insurance card faxed. 

## 2023-12-01 NOTE — Telephone Encounter (Signed)
 Copied from CRM [id number]. Topic: General - Other >> Dec 01, 2023  8:44 AM Adonis Hoot wrote: Reason for CRM: Reason for CRM: Simonne Dubonnet from [facility] associates received an endocrinology referral but they need a copy of the insurance card faxed over as well please.   Fax-[phone] Elena-[phone]  Their office closes at 12 today,they would ike to know if it could be refaxed to them.She stated that they aren't having any issues with their fax machines that she knows of. Stated that provider theres is going out of town and would ,ike to know if it could try and be faxed over again as soon as possible?

## 2023-12-13 NOTE — Telephone Encounter (Unsigned)
 Copied from CRM [id number]. Topic: Referral - Status >> Dec 13, 2023  8:55 AM Ernestene SQUIBB wrote: Reason for CRM: Buel from [facility] associates advise pt stated to her he would like to be seen by someone in [place] as it's closer to his home.

## 2023-12-25 ENCOUNTER — Other Ambulatory Visit: Payer: Self-pay | Admitting: Family Medicine

## 2023-12-25 DIAGNOSIS — I10 Essential (primary) hypertension: Secondary | ICD-10-CM

## 2024-01-06 ENCOUNTER — Other Ambulatory Visit: Payer: Self-pay | Admitting: Family Medicine

## 2024-01-17 ENCOUNTER — Ambulatory Visit: Payer: PRIVATE HEALTH INSURANCE | Admitting: Adult Health

## 2024-01-23 ENCOUNTER — Encounter: Payer: Self-pay | Admitting: Adult Health

## 2024-01-23 ENCOUNTER — Telehealth: Payer: Self-pay | Admitting: Adult Health

## 2024-01-23 ENCOUNTER — Ambulatory Visit (INDEPENDENT_AMBULATORY_CARE_PROVIDER_SITE_OTHER): Payer: PRIVATE HEALTH INSURANCE | Admitting: Adult Health

## 2024-01-23 VITALS — BP 155/77 | HR 77 | Ht 70.0 in | Wt 240.0 lb

## 2024-01-23 DIAGNOSIS — I639 Cerebral infarction, unspecified: Secondary | ICD-10-CM

## 2024-01-23 NOTE — Telephone Encounter (Signed)
 Patient called was lost could not get here. Stayed on the phone with patient to get him to his appointment.

## 2024-01-23 NOTE — Patient Instructions (Signed)
 Your Plan:    Continue Plavix  Blood pressure goal <130/90 Cholesterol LDL goal <70 Diabetes goal A1c <7 Monitor diet and try to exercise      Thank you for coming to see us  at [facility]. I hope we have been able to provide you high quality care today.  You may receive a patient satisfaction survey over the next few weeks. We would appreciate your feedback and comments so that we may continue to improve ourselves and the health of our patients.

## 2024-01-23 NOTE — Progress Notes (Signed)
 PATIENT: Dennis Hopkins DOB: 1961-04-19  REASON FOR VISIT: follow up HISTORY FROM: patient PRIMARY NEUROLOGIST: Dr. Rosemarie  Chief Complaint  Patient presents with   Follow-up    Pt in 4 alone Pt here stroke f/u Pt states no questions or concerns for todays visit      HISTORY OF PRESENT ILLNESS: Today 01/23/24:  Dennis Hopkins is a 63 y.o. male with a history of stroke in the left MCA and PCA returns today for follow-up.  Overall he feels that he is doing relatively well.  He still notices some weakness in the right hand and with fine motor skills.  Still notes some slurring of the speech if he tries to speak fast.  He is now just on Plavix .  Primary care is managing risk factors including blood pressure cholesterol and diabetes.  He drinks alcohol every other day.  He reports approximately 2 drinks typically vodka.  Not smoking.  Denies any recreational drug use.  He returns today for an evaluation.   05/14/24 Dennis Hopkins is a 63 y.o. 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  He remains on Brilinta .  He has already followed up with his primary care.  He is taking Crestor  and Zetia  for his cholesterol.  Primary care is managing his diabetes.  He states that since his stroke he still has trouble with his speech and weakness in the right hand.  Denies any trouble with his gait.  He is still in PT/OT/ST.  No longer drinking alcohol.  Does not smoke.  He returns today for an evaluation.  HISTORY Acute Ischemic Infarct:  Scattered acute infarcts in the left MCA and PCA territories  Etiology:  significant widespread intracranial atherosclerosis  Code Stroke CT head - Patchy loss of gray-white differentiation in the high left frontal and parietal lobes, suspicious for acute left MCA territory infarcts. No acute hemorrhage. 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 Moderate to severe stenosis of the left ICA communicating  segment, just proximal to the origin of the left posterior communicating artery. Moderate to severe stenosis of the right ICA supraclinoid segment. At least 80% stenosis of the proximal right cervical ICA. Approximately 70% stenosis of the proximal left cervical ICA. 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 MRI  Multiple acute infarcts within the left hemisphere, affecting the MCA and PCA territories. 2D Echo EF greater than 75%.  Left atrial size mildly dilated LDL 162 HgbA1c 9.1 VTE prophylaxis - lovenox  No antithrombotic (prescribed plavix ) prior to admission, now on clopidogrel  75 mg daily switch to brilinta  for a minimum of 30 days and then back to plavix   Therapy recommendations:  Outpatient PT/OT/ST Disposition:  Pending   REVIEW OF SYSTEMS: Out of a complete 14 system review of symptoms, the patient complains only of the following symptoms, and all other reviewed systems are negative.  ALLERGIES: Allergies  Allergen Reactions   Asa [Aspirin] Anaphylaxis, Swelling and Other (See Comments)    Lips swelling    HOME MEDICATIONS: Outpatient Medications Prior to Visit  Medication Sig Dispense Refill   clopidogrel  (PLAVIX ) 75 MG tablet Take 1 tablet (75 mg total) by mouth daily. 90 tablet 1   Continuous Glucose Receiver (DEXCOM G7 RECEIVER) DEVI As directed 1 each 0   Continuous Glucose Sensor (DEXCOM G7 SENSOR)  MISC Use to check blood glucose at least 4 times daily. Change sensor every 10 days. 9 each 1   dapagliflozin  propanediol (FARXIGA ) 10 MG TABS tablet Take 1 tablet (10 mg total) by mouth daily before breakfast. 30 tablet 2   ezetimibe  (ZETIA ) 10 MG tablet Take 1 tablet (10 mg total) by mouth daily. 90 tablet 2   folic acid  (FOLVITE ) 1 MG tablet Take 1 tablet (1 mg total) by mouth daily. 30 tablet 0   furosemide  (LASIX ) 20 MG tablet Take 20 mg by mouth  daily.     gabapentin  (NEURONTIN ) 100 MG capsule TAKE 1 TO 3 CAPSULES BY MOUTH THREE TIMES DAILY 270 capsule 1   HUMALOG  KWIKPEN 100 UNIT/ML KwikPen INJECT 8 TO 18 UNITS SUBCUTANEOUSLY THREE TIMES DAILY 15 mL 0   Insulin  Pen Needle 32G X 4 MM MISC 1 Device by Does not apply route in the morning, at noon, in the evening, and at bedtime. 400 each 2   LANTUS  SOLOSTAR 100 UNIT/ML Solostar Pen INJECT 40 UNITS SUBCUTANEOUSLY ONCE DAILY 15 mL 0   methocarbamol  (ROBAXIN ) 500 MG tablet Take 2 tablets (1,000 mg total) by mouth every 6 (six) hours as needed for muscle spasms. 90 tablet 1   metoprolol  (TOPROL -XL) 200 MG 24 hr tablet TAKE 1 TABLET BY MOUTH DAILY TAKE WITH MEAL OR IMMDEIATELY FOLLOWING AFTER A MEAL 30 tablet 2   rosuvastatin  (CRESTOR ) 20 MG tablet Take 2 tablets (40 mg total) by mouth daily. **NOTE DOSE 40 MG NOT IN STOCK** 60 tablet 1   rosuvastatin  (CRESTOR ) 20 MG tablet 2 po every day 180 tablet 3   thiamine  (VITAMIN B1) 100 MG tablet Take 1 tablet (100 mg total) by mouth daily. 30 tablet 0   ticagrelor  (BRILINTA ) 90 MG TABS tablet Take 1 tablet (90 mg total) by mouth 2 (two) times daily. 60 tablet 0   traMADol  (ULTRAM ) 50 MG tablet 1-2 po q6h as needed 180 tablet 0   No facility-administered medications prior to visit.    PAST MEDICAL HISTORY: Past Medical History:  Diagnosis Date   Acute CVA (cerebrovascular accident) (HCC) 04/12/2023   Acute left-sided low back pain with right-sided sciatica 02/14/2019   Calcified granuloma of lung 09/06/2021   Chest pain 11/11/2015   Chickenpox    Chronic alcohol use 04/12/2023   Coronary artery disease 01/12/2016   Diabetes (HCC) 01/18/2016   Type 2     Diabetes mellitus (HCC) 04/13/2021   Eczema 04/12/2022   Essential hypertension    Exertional angina (HCC)    History of CAD (coronary artery disease) 04/12/2023   HTN (hypertension) 10/20/2015   Hyperlipemia    Hyperlipidemia    Hyperlipidemia LDL goal <70 10/20/2015   Hypokalemia  04/12/2023   Insulin  dependent type 2 diabetes mellitus (HCC) 07/19/2016   Ischemic cerebrovascular accident (CVA) (HCC) 04/12/2023   Lumbar foraminal stenosis 01/05/2022   Morbid obesity (HCC) 04/12/2023   Obesity (BMI 30-39.9) 09/26/2013   Peripheral neuropathic pain 09/06/2021   Peripheral neuropathy 04/12/2023   Preventative health care 09/29/2014   Rash 08/24/2020   Right hand weakness 06/22/2023   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   Snoring 08/24/2020   Tobacco use disorder 09/26/2013   Type 2 diabetes mellitus with diabetic peripheral angiopathy without gangrene, without long-term current use of insulin  (HCC) 09/06/2021   Type 2 diabetes mellitus with hyperglycemia, without long-term current use of insulin  (HCC) 04/13/2021   Urinary frequency 08/24/2020    PAST SURGICAL HISTORY: Past Surgical History:  Procedure Laterality Date   CARDIAC CATHETERIZATION N/A 01/07/2016   Procedure: Left Heart Cath and Coronary Angiography;  Surgeon: Candyce GORMAN Reek, MD;  Location: [facility] INVASIVE CV LAB;  Service: Cardiovascular;  Laterality: N/A;   CORONARY ARTERY BYPASS GRAFT N/A 01/13/2016   Procedure: CORONARY ARTERY BYPASS GRAFTING (CABG) x4 Endoscopic Harvesting of the Right Greater Saphenous Vein;  Surgeon: Elspeth JAYSON Millers, MD;  Location: [facility] OR;  Service: Open Heart Surgery;  Laterality: N/A;   TEE WITHOUT CARDIOVERSION N/A 01/13/2016   Procedure: TRANSESOPHAGEAL ECHOCARDIOGRAM (TEE);  Surgeon: Elspeth JAYSON Millers, MD;  Location: [facility] OR;  Service: Open Heart Surgery;  Laterality: N/A;   TONSILLECTOMY      FAMILY HISTORY: Family History  Problem Relation Age of Onset   Arthritis Mother    Diabetes Mother    Arthritis Father    Hyperlipidemia Father    Diabetes Father    Diabetes Brother    Stroke Neg Hx     SOCIAL HISTORY: Social History   Socioeconomic History   Marital status: Widowed    Spouse name: Not on file   Number of children: 1   Years of education: Not on file    Highest education level: Not on file  Occupational History   Not on file  Tobacco Use   Smoking status: Former    Current packs/day: 0.00    Average packs/day: 2.0 packs/day for 35.0 years (70.0 ttl pk-yrs)    Types: Cigarettes    Start date: 12/22/1978    Quit date: 12/21/2013    Years since quitting: 10.0   Smokeless tobacco: Never  Vaping Use   Vaping status: Never Used  Substance and Sexual Activity   Alcohol use: Yes    Alcohol/week: 6.0 standard drinks of alcohol    Types: 2 Cans of beer, 4 Shots of liquor per week    Comment: 1-2 drinks per night- wife reports he drinks more    Drug use: No   Sexual activity: Yes    Partners: Female  Other Topics Concern   Not on file  Social History Narrative   Pt lives alone    Pt works    Social Drivers of Corporate investment banker Strain: Low Risk  (11/22/2021)   Overall Financial Resource Strain (CARDIA)    Difficulty of Paying Living Expenses: Not very hard  Food Insecurity: Not on file  Transportation Needs: Not on file  Physical Activity: Not on file  Stress: Not on file  Social Connections: Not on file  Intimate Partner Violence: Not on file      PHYSICAL EXAM  Vitals:   01/23/24 1405  BP: (!) 155/77  Pulse: 77  Weight: 240 lb (108.9 kg)  Height: 5' 10 (1.778 m)   Body mass index is 34.44 kg/m.  Generalized: Well developed, in no acute distress   Neurological examination  Mentation: Alert oriented to time, place, history taking. Follows all commands speech and language fluent Cranial nerve II-XII: Pupils were equal round reactive to light. Extraocular movements were full, visual field were full on confrontational test.  Facial droop-right lower face.   Head turning and shoulder shrug  were normal and symmetric. Motor: The motor testing reveals 5 over 5 strength of all 4 extremities.  Decreased grip strength in the right hand good symmetric motor tone is noted throughout.  Sensory: Sensory testing is intact to  soft touch on all 4 extremities. No evidence of extinction is noted.  Coordination: Cerebellar testing reveals good finger-nose-finger  and heel-to-shin bilaterally.  Gait and station: Gait is normal. .  Reflexes: Deep tendon reflexes are symmetric and normal bilaterally.   DIAGNOSTIC DATA (LABS, IMAGING, TESTING) - I reviewed patient records, labs, notes, testing and imaging myself where available.  Lab Results  Component Value Date   WBC 9.0 11/27/2023   HGB 13.8 11/27/2023   HCT 42.2 11/27/2023   MCV 88.1 11/27/2023   PLT 202 11/27/2023      Component Value Date/Time   NA 141 11/27/2023 1615   NA 139 09/23/2020 0000   K 4.7 11/27/2023 1615   CL 105 11/27/2023 1615   CO2 28 11/27/2023 1615   GLUCOSE 120 (H) 11/27/2023 1615   BUN 19 11/27/2023 1615   BUN 24 (A) 09/23/2020 0000   CREATININE 1.33 11/27/2023 1615   CALCIUM  9.6 11/27/2023 1615   PROT 7.2 11/27/2023 1615   ALBUMIN  4.3 06/22/2023 1335   AST 12 11/27/2023 1615   ALT 12 11/27/2023 1615   ALKPHOS 58 06/22/2023 1335   BILITOT 0.4 11/27/2023 1615   GFRNONAA >60 04/15/2023 0519   GFRAA >60 01/18/2016 0749   Lab Results  Component Value Date   CHOL 217 (H) 11/27/2023   HDL 45 11/27/2023   LDLCALC 130 (H) 11/27/2023   LDLDIRECT 173.0 02/07/2022   TRIG 275 (H) 11/27/2023   CHOLHDL 4.8 11/27/2023   Lab Results  Component Value Date   HGBA1C 8.3 (H) 11/27/2023   Lab Results  Component Value Date   VITAMINB12 322 02/17/2021   Lab Results  Component Value Date   TSH 1.39 02/07/2022      ASSESSMENT AND PLAN 63 y.o. year old male  has a past medical history of Acute CVA (cerebrovascular accident) (HCC) (04/12/2023), Acute left-sided low back pain with right-sided sciatica (02/14/2019), 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09/06/2021), Chest pain (11/11/2015), Chickenpox, Chronic alcohol use (04/12/2023), Coronary artery disease (01/12/2016), Diabetes (HCC) (01/18/2016), Diabetes mellitus (HCC) (04/13/2021), Eczema  (04/12/2022), Essential hypertension, Exertional angina (HCC), History of CAD (coronary artery disease) (04/12/2023), HTN (hypertension) (10/20/2015), Hyperlipemia, Hyperlipidemia, Hyperlipidemia LDL goal <70 (10/20/2015), Hypokalemia (04/12/2023), Insulin  dependent type 2 diabetes mellitus (HCC) (07/19/2016), Ischemic cerebrovascular accident (CVA) (HCC) (04/12/2023), Lumbar foraminal stenosis (01/05/2022), Morbid obesity (HCC) (04/12/2023), Obesity (BMI 30-39.9) (09/26/2013), Peripheral neuropathic pain (09/06/2021), Peripheral neuropathy (04/12/2023), Preventative health care (09/29/2014), Rash (08/24/2020), Right hand weakness (06/22/2023), 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 here with:  Acute Ischemic Infarct:  Scattered acute infarcts in the left MCA and PCA territories   Continue  Plavix  for secondary stroke prevention.   Discussed secondary stroke prevention measures and importance of close PCP follow up for aggressive stroke risk factor management. I have gone over the pathophysiology of stroke, warning signs and symptoms, risk factors and their management in some detail with instructions to go to the closest emergency room for symptoms of concern. HTN: BP goal <130/90.  HLD: LDL goal <70. Recent LDL managed by PCP DMII: A1c goal<7.0. Recent A1c managed by PCP Encouraged patient to monitor diet and encouraged exercise FU with our office on an as needed basis      Duwaine Russell, MSN, NP-C 01/23/2024, 2:26 PM  [facility] [address] [place], [place] [address] [phone]

## 2024-01-24 NOTE — Progress Notes (Signed)
 I agree with the above plan

## 2024-02-06 ENCOUNTER — Telehealth: Payer: Self-pay | Admitting: Family Medicine

## 2024-02-06 NOTE — Telephone Encounter (Signed)
 Copied from CRM [id number]. Topic: Appointments - Scheduling Inquiry for Clinic >> Feb 06, 2024 12:30 PM [name] wrote: Reason for CRM: Patient called in regarding a annual physical, stated needs to be seen before the end of the month due to insurance purposes , first available isnt until September    ----------------------------------------------------------------------- From previous Reason for Contact - Scheduling: Patient/patient representative is calling to schedule an appointment. Refer to attachments for appointment information.

## 2024-02-07 NOTE — Telephone Encounter (Signed)
 Called pt unable to lvm. Sent mychart message

## 2024-02-09 NOTE — Telephone Encounter (Signed)
 Copied from CRM [id number]. Topic: Appointments - Scheduling Inquiry for Clinic >> Feb 06, 2024 12:30 PM Dennis Hopkins wrote: Reason for CRM: Patient called in regarding a annual physical, stated needs to be seen before the end of the month due to insurance purposes , first available isnt until September    ----------------------------------------------------------------------- From previous Reason for Contact - Scheduling: Patient/patient representative is calling to schedule an appointment. Refer to attachments for appointment information.   Please advise if pt can be worked in

## 2024-03-11 ENCOUNTER — Other Ambulatory Visit: Payer: Self-pay | Admitting: Family Medicine

## 2024-04-02 ENCOUNTER — Other Ambulatory Visit: Payer: Self-pay | Admitting: Family Medicine

## 2024-04-02 DIAGNOSIS — I10 Essential (primary) hypertension: Secondary | ICD-10-CM

## 2024-05-23 ENCOUNTER — Other Ambulatory Visit: Payer: Self-pay | Admitting: Family Medicine

## 2024-05-23 DIAGNOSIS — I2510 Atherosclerotic heart disease of native coronary artery without angina pectoris: Secondary | ICD-10-CM

## 2024-07-03 ENCOUNTER — Other Ambulatory Visit: Payer: Self-pay | Admitting: Family Medicine

## 2024-07-03 DIAGNOSIS — I10 Essential (primary) hypertension: Secondary | ICD-10-CM

## 2024-07-04 ENCOUNTER — Other Ambulatory Visit: Payer: Self-pay | Admitting: Family Medicine
